# Patient Record
Sex: Female | Born: 1945
Health system: Southern US, Community
[De-identification: ages and names within clinical notes are randomized; demographics above are authoritative.]

## PROBLEM LIST (undated history)

## (undated) DIAGNOSIS — F419 Anxiety disorder, unspecified: Secondary | ICD-10-CM

## (undated) DIAGNOSIS — I10 Essential (primary) hypertension: Secondary | ICD-10-CM

## (undated) DIAGNOSIS — D649 Anemia, unspecified: Secondary | ICD-10-CM

## (undated) DIAGNOSIS — Z780 Asymptomatic menopausal state: Secondary | ICD-10-CM

## (undated) DIAGNOSIS — M545 Low back pain, unspecified: Secondary | ICD-10-CM

## (undated) DIAGNOSIS — R011 Cardiac murmur, unspecified: Secondary | ICD-10-CM

## (undated) DIAGNOSIS — M810 Age-related osteoporosis without current pathological fracture: Secondary | ICD-10-CM

## (undated) DIAGNOSIS — H409 Unspecified glaucoma: Secondary | ICD-10-CM

## (undated) DIAGNOSIS — K219 Gastro-esophageal reflux disease without esophagitis: Secondary | ICD-10-CM

## (undated) DIAGNOSIS — E119 Type 2 diabetes mellitus without complications: Secondary | ICD-10-CM

## (undated) DIAGNOSIS — K862 Cyst of pancreas: Secondary | ICD-10-CM

## (undated) DIAGNOSIS — D126 Benign neoplasm of colon, unspecified: Secondary | ICD-10-CM

## (undated) DIAGNOSIS — E538 Deficiency of other specified B group vitamins: Secondary | ICD-10-CM

## (undated) DIAGNOSIS — K589 Irritable bowel syndrome without diarrhea: Secondary | ICD-10-CM

## (undated) DIAGNOSIS — H269 Unspecified cataract: Secondary | ICD-10-CM

## (undated) DIAGNOSIS — C569 Malignant neoplasm of unspecified ovary: Secondary | ICD-10-CM

## (undated) HISTORY — DX: Malignant neoplasm of unspecified ovary: C56.9

## (undated) HISTORY — DX: Essential (primary) hypertension: I10

## (undated) HISTORY — DX: Anxiety disorder, unspecified: F41.9

## (undated) HISTORY — DX: Asymptomatic menopausal state: Z78.0

## (undated) HISTORY — DX: Low back pain, unspecified: M54.50

## (undated) HISTORY — PX: ABDOMINAL HYSTERECTOMY: SHX81

## (undated) HISTORY — DX: Irritable bowel syndrome, unspecified: K58.9

## (undated) HISTORY — DX: Type 2 diabetes mellitus without complications: E11.9

## (undated) HISTORY — DX: Unspecified glaucoma: H40.9

## (undated) HISTORY — PX: POLYPECTOMY: SHX149

## (undated) HISTORY — DX: Gastro-esophageal reflux disease without esophagitis: K21.9

## (undated) HISTORY — DX: Anemia, unspecified: D64.9

## (undated) HISTORY — DX: Deficiency of other specified B group vitamins: E53.8

## (undated) HISTORY — DX: Cardiac murmur, unspecified: R01.1

## (undated) HISTORY — DX: Unspecified cataract: H26.9

## (undated) HISTORY — DX: Age-related osteoporosis without current pathological fracture: M81.0

## (undated) HISTORY — DX: Benign neoplasm of colon, unspecified: D12.6

## (undated) HISTORY — PX: APPENDECTOMY: SHX54

## (undated) HISTORY — DX: Low back pain: M54.5

## (undated) HISTORY — DX: Cyst of pancreas: K86.2

## (undated) HISTORY — PX: ELBOW FRACTURE SURGERY: SHX616

## (undated) HISTORY — PX: CORONARY ARTERY BYPASS GRAFT: SHX141

---

## 1999-09-06 HISTORY — PX: HEMORRHOID SURGERY: SHX153

## 1999-09-21 ENCOUNTER — Encounter (INDEPENDENT_AMBULATORY_CARE_PROVIDER_SITE_OTHER): Payer: Self-pay | Admitting: Specialist

## 1999-09-21 ENCOUNTER — Other Ambulatory Visit: Admission: RE | Admit: 1999-09-21 | Discharge: 1999-09-21 | Payer: Self-pay | Admitting: Gastroenterology

## 1999-11-04 ENCOUNTER — Encounter (INDEPENDENT_AMBULATORY_CARE_PROVIDER_SITE_OTHER): Payer: Self-pay | Admitting: Specialist

## 1999-11-04 ENCOUNTER — Ambulatory Visit (HOSPITAL_COMMUNITY): Admission: RE | Admit: 1999-11-04 | Discharge: 1999-11-04 | Payer: Self-pay | Admitting: General Surgery

## 2000-06-15 ENCOUNTER — Ambulatory Visit (HOSPITAL_COMMUNITY): Admission: RE | Admit: 2000-06-15 | Discharge: 2000-06-15 | Payer: Self-pay | Admitting: Gastroenterology

## 2000-06-15 ENCOUNTER — Encounter: Payer: Self-pay | Admitting: Gastroenterology

## 2000-07-31 ENCOUNTER — Encounter: Payer: Self-pay | Admitting: Gynecology

## 2000-07-31 ENCOUNTER — Encounter: Admission: RE | Admit: 2000-07-31 | Discharge: 2000-07-31 | Payer: Self-pay | Admitting: Gynecology

## 2000-08-07 ENCOUNTER — Other Ambulatory Visit: Admission: RE | Admit: 2000-08-07 | Discharge: 2000-08-07 | Payer: Self-pay | Admitting: Gynecology

## 2000-08-14 ENCOUNTER — Encounter: Admission: RE | Admit: 2000-08-14 | Discharge: 2000-08-14 | Payer: Self-pay | Admitting: Gynecology

## 2000-08-14 ENCOUNTER — Encounter: Payer: Self-pay | Admitting: Gynecology

## 2001-08-06 ENCOUNTER — Encounter: Admission: RE | Admit: 2001-08-06 | Discharge: 2001-08-06 | Payer: Self-pay | Admitting: Gynecology

## 2001-08-06 ENCOUNTER — Encounter: Payer: Self-pay | Admitting: Gynecology

## 2001-08-08 ENCOUNTER — Encounter: Admission: RE | Admit: 2001-08-08 | Discharge: 2001-08-08 | Payer: Self-pay | Admitting: Gynecology

## 2001-08-08 ENCOUNTER — Encounter: Payer: Self-pay | Admitting: Gynecology

## 2001-08-20 ENCOUNTER — Other Ambulatory Visit: Admission: RE | Admit: 2001-08-20 | Discharge: 2001-08-20 | Payer: Self-pay | Admitting: Gynecology

## 2002-05-30 ENCOUNTER — Encounter: Payer: Self-pay | Admitting: Internal Medicine

## 2002-05-30 ENCOUNTER — Ambulatory Visit (HOSPITAL_COMMUNITY): Admission: RE | Admit: 2002-05-30 | Discharge: 2002-05-30 | Payer: Self-pay | Admitting: Internal Medicine

## 2002-08-13 ENCOUNTER — Encounter: Payer: Self-pay | Admitting: Gynecology

## 2002-08-13 ENCOUNTER — Encounter: Admission: RE | Admit: 2002-08-13 | Discharge: 2002-08-13 | Payer: Self-pay | Admitting: Gynecology

## 2002-08-27 ENCOUNTER — Other Ambulatory Visit: Admission: RE | Admit: 2002-08-27 | Discharge: 2002-08-27 | Payer: Self-pay | Admitting: Gynecology

## 2003-08-15 ENCOUNTER — Encounter: Admission: RE | Admit: 2003-08-15 | Discharge: 2003-08-15 | Payer: Self-pay | Admitting: Gynecology

## 2003-09-02 ENCOUNTER — Other Ambulatory Visit: Admission: RE | Admit: 2003-09-02 | Discharge: 2003-09-02 | Payer: Self-pay | Admitting: Gynecology

## 2004-06-15 ENCOUNTER — Ambulatory Visit (HOSPITAL_COMMUNITY): Admission: RE | Admit: 2004-06-15 | Discharge: 2004-06-15 | Payer: Self-pay | Admitting: Gastroenterology

## 2004-07-08 ENCOUNTER — Ambulatory Visit: Payer: Self-pay | Admitting: Gastroenterology

## 2004-08-09 ENCOUNTER — Ambulatory Visit: Payer: Self-pay | Admitting: Internal Medicine

## 2004-08-20 ENCOUNTER — Ambulatory Visit: Payer: Self-pay | Admitting: Internal Medicine

## 2004-08-20 ENCOUNTER — Ambulatory Visit (HOSPITAL_COMMUNITY): Admission: RE | Admit: 2004-08-20 | Discharge: 2004-08-20 | Payer: Self-pay | Admitting: Internal Medicine

## 2004-08-23 ENCOUNTER — Ambulatory Visit: Payer: Self-pay | Admitting: *Deleted

## 2004-08-23 ENCOUNTER — Encounter: Payer: Self-pay | Admitting: Cardiology

## 2004-08-23 ENCOUNTER — Ambulatory Visit: Payer: Self-pay | Admitting: Internal Medicine

## 2004-08-23 ENCOUNTER — Observation Stay (HOSPITAL_COMMUNITY): Admission: AD | Admit: 2004-08-23 | Discharge: 2004-08-24 | Payer: Self-pay | Admitting: Internal Medicine

## 2004-09-08 ENCOUNTER — Encounter: Admission: RE | Admit: 2004-09-08 | Discharge: 2004-09-08 | Payer: Self-pay | Admitting: Gynecology

## 2004-09-14 ENCOUNTER — Other Ambulatory Visit: Admission: RE | Admit: 2004-09-14 | Discharge: 2004-09-14 | Payer: Self-pay | Admitting: Gynecology

## 2005-01-11 ENCOUNTER — Ambulatory Visit: Payer: Self-pay | Admitting: Internal Medicine

## 2005-03-24 ENCOUNTER — Ambulatory Visit: Payer: Self-pay | Admitting: Internal Medicine

## 2005-06-21 ENCOUNTER — Ambulatory Visit: Payer: Self-pay | Admitting: Internal Medicine

## 2005-08-12 ENCOUNTER — Ambulatory Visit: Payer: Self-pay | Admitting: Internal Medicine

## 2005-09-21 ENCOUNTER — Ambulatory Visit: Payer: Self-pay | Admitting: Internal Medicine

## 2005-10-05 ENCOUNTER — Encounter: Admission: RE | Admit: 2005-10-05 | Discharge: 2005-10-05 | Payer: Self-pay | Admitting: Gynecology

## 2005-10-10 ENCOUNTER — Other Ambulatory Visit: Admission: RE | Admit: 2005-10-10 | Discharge: 2005-10-10 | Payer: Self-pay | Admitting: Gynecology

## 2006-10-31 ENCOUNTER — Encounter: Admission: RE | Admit: 2006-10-31 | Discharge: 2006-10-31 | Payer: Self-pay | Admitting: Gynecology

## 2006-11-16 ENCOUNTER — Other Ambulatory Visit: Admission: RE | Admit: 2006-11-16 | Discharge: 2006-11-16 | Payer: Self-pay | Admitting: Gynecology

## 2007-05-31 ENCOUNTER — Encounter: Payer: Self-pay | Admitting: Internal Medicine

## 2007-05-31 DIAGNOSIS — E538 Deficiency of other specified B group vitamins: Secondary | ICD-10-CM | POA: Insufficient documentation

## 2007-06-30 ENCOUNTER — Ambulatory Visit: Payer: Self-pay | Admitting: Internal Medicine

## 2007-08-10 ENCOUNTER — Ambulatory Visit: Payer: Self-pay | Admitting: Internal Medicine

## 2007-08-10 DIAGNOSIS — I10 Essential (primary) hypertension: Secondary | ICD-10-CM

## 2007-08-10 DIAGNOSIS — M545 Low back pain: Secondary | ICD-10-CM

## 2007-08-10 DIAGNOSIS — M25559 Pain in unspecified hip: Secondary | ICD-10-CM | POA: Insufficient documentation

## 2007-08-10 HISTORY — DX: Essential (primary) hypertension: I10

## 2007-08-13 LAB — CONVERTED CEMR LAB
ALT: 27 units/L (ref 0–35)
AST: 28 units/L (ref 0–37)
Albumin: 3.9 g/dL (ref 3.5–5.2)
Alkaline Phosphatase: 85 units/L (ref 39–117)
BUN: 10 mg/dL (ref 6–23)
Bilirubin, Direct: 0.1 mg/dL (ref 0.0–0.3)
CO2: 28 meq/L (ref 19–32)
Calcium: 9.1 mg/dL (ref 8.4–10.5)
Chloride: 105 meq/L (ref 96–112)
Cholesterol: 184 mg/dL (ref 0–200)
Creatinine, Ser: 0.7 mg/dL (ref 0.4–1.2)
GFR calc Af Amer: 109 mL/min
GFR calc non Af Amer: 90 mL/min
Glucose, Bld: 111 mg/dL — ABNORMAL HIGH (ref 70–99)
HDL: 39.4 mg/dL (ref >39.0)
LDL Cholesterol: 124 mg/dL — ABNORMAL HIGH (ref 0–99)
Potassium: 3.6 meq/L (ref 3.5–5.1)
Sodium: 141 meq/L (ref 135–145)
TSH: 1.25 microintl units/mL (ref 0.35–5.50)
Total Bilirubin: 0.9 mg/dL (ref 0.3–1.2)
Total CHOL/HDL Ratio: 4.7
Total Protein: 7.1 g/dL (ref 6.0–8.3)
Triglycerides: 103 mg/dL (ref 0–149)
VLDL: 21 mg/dL (ref 0–40)
Vitamin B-12: 600 pg/mL (ref 211–911)

## 2007-08-14 ENCOUNTER — Encounter: Payer: Self-pay | Admitting: Internal Medicine

## 2007-08-17 LAB — CONVERTED CEMR LAB: Vit D, 1,25-Dihydroxy: 20 — ABNORMAL LOW (ref 30–89)

## 2007-11-06 ENCOUNTER — Ambulatory Visit: Payer: Self-pay | Admitting: Internal Medicine

## 2007-11-07 LAB — CONVERTED CEMR LAB: Vitamin B-12: 1004 pg/mL — ABNORMAL HIGH (ref 211–911)

## 2007-11-08 ENCOUNTER — Telehealth: Payer: Self-pay | Admitting: Internal Medicine

## 2007-11-12 ENCOUNTER — Ambulatory Visit: Payer: Self-pay | Admitting: Internal Medicine

## 2007-11-12 DIAGNOSIS — B079 Viral wart, unspecified: Secondary | ICD-10-CM | POA: Insufficient documentation

## 2007-11-12 DIAGNOSIS — E559 Vitamin D deficiency, unspecified: Secondary | ICD-10-CM

## 2007-11-12 DIAGNOSIS — K219 Gastro-esophageal reflux disease without esophagitis: Secondary | ICD-10-CM

## 2007-11-15 ENCOUNTER — Encounter: Admission: RE | Admit: 2007-11-15 | Discharge: 2007-11-15 | Payer: Self-pay | Admitting: Gynecology

## 2007-11-19 ENCOUNTER — Encounter: Payer: Self-pay | Admitting: Internal Medicine

## 2008-05-13 ENCOUNTER — Ambulatory Visit: Payer: Self-pay | Admitting: Internal Medicine

## 2008-05-14 LAB — CONVERTED CEMR LAB
BUN: 14 mg/dL (ref 6–23)
CO2: 29 meq/L (ref 19–32)
Calcium: 9.7 mg/dL (ref 8.4–10.5)
Chloride: 109 meq/L (ref 96–112)
Creatinine, Ser: 0.8 mg/dL (ref 0.4–1.2)
GFR calc Af Amer: 93 mL/min
GFR calc non Af Amer: 77 mL/min
Glucose, Bld: 112 mg/dL — ABNORMAL HIGH (ref 70–99)
Potassium: 4.3 meq/L (ref 3.5–5.1)
Sodium: 143 meq/L (ref 135–145)
Vitamin B-12: 470 pg/mL (ref 211–911)

## 2008-05-19 LAB — CONVERTED CEMR LAB: Vit D, 1,25-Dihydroxy: 26 — ABNORMAL LOW (ref 30–89)

## 2008-08-20 ENCOUNTER — Encounter: Payer: Self-pay | Admitting: Internal Medicine

## 2008-09-05 DIAGNOSIS — C569 Malignant neoplasm of unspecified ovary: Secondary | ICD-10-CM

## 2008-09-05 HISTORY — PX: OTHER SURGICAL HISTORY: SHX169

## 2008-09-05 HISTORY — DX: Malignant neoplasm of unspecified ovary: C56.9

## 2008-09-22 ENCOUNTER — Telehealth: Payer: Self-pay | Admitting: Internal Medicine

## 2008-09-22 ENCOUNTER — Ambulatory Visit: Payer: Self-pay | Admitting: Internal Medicine

## 2008-09-22 DIAGNOSIS — R109 Unspecified abdominal pain: Secondary | ICD-10-CM | POA: Insufficient documentation

## 2008-09-22 LAB — CONVERTED CEMR LAB
ALT: 28 units/L (ref 0–35)
AST: 24 units/L (ref 0–37)
Albumin: 4.1 g/dL (ref 3.5–5.2)
Alkaline Phosphatase: 85 units/L (ref 39–117)
BUN: 9 mg/dL (ref 6–23)
Basophils Absolute: 0 10*3/uL (ref 0.0–0.1)
Basophils Relative: 0.6 % (ref 0.0–3.0)
Bilirubin Urine: NEGATIVE
Bilirubin, Direct: 0.1 mg/dL (ref 0.0–0.3)
CO2: 30 meq/L (ref 19–32)
Calcium: 9.2 mg/dL (ref 8.4–10.5)
Chloride: 105 meq/L (ref 96–112)
Creatinine, Ser: 0.8 mg/dL (ref 0.4–1.2)
Crystals: NEGATIVE
Eosinophils Absolute: 0.1 10*3/uL (ref 0.0–0.7)
Eosinophils Relative: 0.8 % (ref 0.0–5.0)
GFR calc Af Amer: 93 mL/min
GFR calc non Af Amer: 77 mL/min
Glucose, Bld: 109 mg/dL — ABNORMAL HIGH (ref 70–99)
HCT: 35.9 % — ABNORMAL LOW (ref 36.0–46.0)
Hemoglobin: 12.7 g/dL (ref 12.0–15.0)
Ketones, ur: NEGATIVE mg/dL
Leukocytes, UA: NEGATIVE
Lipase: 27 units/L (ref 11.0–59.0)
Lymphocytes Relative: 20.2 % (ref 12.0–46.0)
MCHC: 35.4 g/dL (ref 30.0–36.0)
MCV: 84.3 fL (ref 78.0–100.0)
Monocytes Absolute: 0.5 10*3/uL (ref 0.1–1.0)
Monocytes Relative: 6.8 % (ref 3.0–12.0)
Neutro Abs: 5.1 10*3/uL (ref 1.4–7.7)
Neutrophils Relative %: 71.6 % (ref 43.0–77.0)
Nitrite: NEGATIVE
Platelets: 234 10*3/uL (ref 150–400)
Potassium: 3.7 meq/L (ref 3.5–5.1)
RBC: 4.25 M/uL (ref 3.87–5.11)
RDW: 12.5 % (ref 11.5–14.6)
Sed Rate: 37 mm/hr — ABNORMAL HIGH (ref 0–22)
Sodium: 143 meq/L (ref 135–145)
Specific Gravity, Urine: 1.025 (ref 1.000–1.03)
Total Bilirubin: 0.9 mg/dL (ref 0.3–1.2)
Total Protein, Urine: NEGATIVE mg/dL
Total Protein: 7.8 g/dL (ref 6.0–8.3)
Urine Glucose: NEGATIVE mg/dL
Urobilinogen, UA: 0.2 (ref 0.0–1.0)
Vitamin B-12: 433 pg/mL (ref 211–911)
WBC: 7.1 10*3/uL (ref 4.5–10.5)
pH: 5.5 (ref 5.0–8.0)

## 2008-09-23 ENCOUNTER — Ambulatory Visit: Payer: Self-pay | Admitting: Cardiology

## 2008-09-24 ENCOUNTER — Encounter: Payer: Self-pay | Admitting: Internal Medicine

## 2008-09-24 LAB — CONVERTED CEMR LAB
CA 125: 206.3 units/mL — ABNORMAL HIGH (ref 0.0–30.2)
CEA: 1.4 ng/mL (ref 0.0–5.0)

## 2008-09-25 ENCOUNTER — Ambulatory Visit: Payer: Self-pay | Admitting: Internal Medicine

## 2008-09-25 DIAGNOSIS — D490 Neoplasm of unspecified behavior of digestive system: Secondary | ICD-10-CM

## 2008-09-29 ENCOUNTER — Encounter: Payer: Self-pay | Admitting: Internal Medicine

## 2008-09-30 ENCOUNTER — Ambulatory Visit: Admission: RE | Admit: 2008-09-30 | Discharge: 2008-09-30 | Payer: Self-pay | Admitting: Gynecologic Oncology

## 2008-10-01 ENCOUNTER — Ambulatory Visit: Payer: Self-pay | Admitting: Oncology

## 2008-10-07 ENCOUNTER — Encounter: Payer: Self-pay | Admitting: Gynecologic Oncology

## 2008-10-07 ENCOUNTER — Inpatient Hospital Stay (HOSPITAL_COMMUNITY): Admission: RE | Admit: 2008-10-07 | Discharge: 2008-10-14 | Payer: Self-pay | Admitting: Gynecology

## 2008-10-22 ENCOUNTER — Encounter: Payer: Self-pay | Admitting: Internal Medicine

## 2008-10-22 LAB — CBC WITH DIFFERENTIAL/PLATELET
BASO%: 0.4 % (ref 0.0–2.0)
EOS%: 4.9 % (ref 0.0–7.0)
MCH: 28.9 pg (ref 26.0–34.0)
MCHC: 34.3 g/dL (ref 32.0–36.0)
MCV: 84.3 fL (ref 81.0–101.0)
MONO%: 6.5 % (ref 0.0–13.0)
RBC: 4.19 10*6/uL (ref 3.70–5.32)
RDW: 12.9 % (ref 11.3–14.5)
lymph#: 1.2 10*3/uL (ref 0.9–3.3)

## 2008-10-22 LAB — COMPREHENSIVE METABOLIC PANEL
ALT: 19 U/L (ref 0–35)
AST: 15 U/L (ref 0–37)
Albumin: 4.4 g/dL (ref 3.5–5.2)
Alkaline Phosphatase: 84 U/L (ref 39–117)
BUN: 14 mg/dL (ref 6–23)
Calcium: 9.7 mg/dL (ref 8.4–10.5)
Chloride: 106 mEq/L (ref 96–112)
Potassium: 4.1 mEq/L (ref 3.5–5.3)

## 2008-11-07 LAB — CBC WITH DIFFERENTIAL/PLATELET
Basophils Absolute: 0 10*3/uL (ref 0.0–0.1)
EOS%: 2.7 % (ref 0.0–7.0)
HCT: 35.2 % (ref 34.8–46.6)
HGB: 11.8 g/dL (ref 11.6–15.9)
LYMPH%: 28.3 % (ref 14.0–49.7)
MCH: 27.8 pg (ref 25.1–34.0)
MCV: 83 fL (ref 79.5–101.0)
MONO%: 8.4 % (ref 0.0–14.0)
NEUT%: 60.2 % (ref 38.4–76.8)
Platelets: 203 10*3/uL (ref 145–400)

## 2008-11-07 LAB — URINALYSIS, MICROSCOPIC - CHCC
Nitrite: NEGATIVE
Protein: <30 mg/dL
Specific Gravity, Urine: 1.025 (ref 1.003–1.035)

## 2008-11-17 ENCOUNTER — Encounter: Admission: RE | Admit: 2008-11-17 | Discharge: 2008-11-17 | Payer: Self-pay | Admitting: Oncology

## 2008-11-18 ENCOUNTER — Ambulatory Visit: Payer: Self-pay | Admitting: Oncology

## 2008-11-18 LAB — CBC WITH DIFFERENTIAL/PLATELET
BASO%: 0.5 % (ref 0.0–2.0)
Basophils Absolute: 0 10*3/uL (ref 0.0–0.1)
HCT: 37.1 % (ref 34.8–46.6)
HGB: 12.5 g/dL (ref 11.6–15.9)
MCHC: 33.7 g/dL (ref 31.5–36.0)
MONO#: 0.1 10*3/uL (ref 0.1–0.9)
NEUT%: 61.9 % (ref 38.4–76.8)
RDW: 13.2 % (ref 11.2–14.5)
WBC: 3.7 10*3/uL — ABNORMAL LOW (ref 3.9–10.3)
lymph#: 1 10*3/uL (ref 0.9–3.3)

## 2008-11-24 LAB — CBC WITH DIFFERENTIAL/PLATELET
Basophils Absolute: 0 10*3/uL (ref 0.0–0.1)
Eosinophils Absolute: 0.1 10*3/uL (ref 0.0–0.5)
HGB: 10.9 g/dL — ABNORMAL LOW (ref 11.6–15.9)
MCV: 82.8 fL (ref 79.5–101.0)
MONO#: 0.3 10*3/uL (ref 0.1–0.9)
MONO%: 14.4 % — ABNORMAL HIGH (ref 0.0–14.0)
NEUT#: 0.5 10*3/uL — ABNORMAL LOW (ref 1.5–6.5)
RBC: 3.95 10*6/uL (ref 3.70–5.45)
RDW: 13.2 % (ref 11.2–14.5)
WBC: 2.1 10*3/uL — ABNORMAL LOW (ref 3.9–10.3)
lymph#: 1.2 10*3/uL (ref 0.9–3.3)
nRBC: 0 % (ref 0–0)

## 2008-11-26 LAB — CBC WITH DIFFERENTIAL/PLATELET
Basophils Absolute: 0.1 10*3/uL (ref 0.0–0.1)
Eosinophils Absolute: 0.2 10*3/uL (ref 0.0–0.5)
LYMPH%: 27.3 % (ref 14.0–49.7)
MCV: 81.9 fL (ref 79.5–101.0)
MONO%: 14.1 % — ABNORMAL HIGH (ref 0.0–14.0)
NEUT#: 2.3 10*3/uL (ref 1.5–6.5)
Platelets: 156 10*3/uL (ref 145–400)
RBC: 4.03 10*6/uL (ref 3.70–5.45)
nRBC: 1 % — ABNORMAL HIGH (ref 0–0)

## 2008-11-28 ENCOUNTER — Encounter: Payer: Self-pay | Admitting: Internal Medicine

## 2008-11-28 LAB — COMPREHENSIVE METABOLIC PANEL
ALT: 28 U/L (ref 0–35)
AST: 26 U/L (ref 0–37)
Creatinine, Ser: 0.85 mg/dL (ref 0.40–1.20)
Total Bilirubin: 0.4 mg/dL (ref 0.3–1.2)

## 2008-11-28 LAB — CBC WITH DIFFERENTIAL/PLATELET
BASO%: 0.5 % (ref 0.0–2.0)
EOS%: 2.2 % (ref 0.0–7.0)
HCT: 32.7 % — ABNORMAL LOW (ref 34.8–46.6)
LYMPH%: 24 % (ref 14.0–49.7)
MCH: 29 pg (ref 25.1–34.0)
MCHC: 34.4 g/dL (ref 31.5–36.0)
MCV: 84.2 fL (ref 79.5–101.0)
MONO%: 12.7 % (ref 0.0–14.0)
NEUT%: 60.6 % (ref 38.4–76.8)
Platelets: 163 10*3/uL (ref 145–400)

## 2008-12-03 LAB — CBC WITH DIFFERENTIAL/PLATELET
Basophils Absolute: 0 10*3/uL (ref 0.0–0.1)
EOS%: 0.1 % (ref 0.0–7.0)
Eosinophils Absolute: 0 10*3/uL (ref 0.0–0.5)
HCT: 32.6 % — ABNORMAL LOW (ref 34.8–46.6)
HGB: 11.4 g/dL — ABNORMAL LOW (ref 11.6–15.9)
MCH: 29.2 pg (ref 25.1–34.0)
MCV: 84 fL (ref 79.5–101.0)
NEUT#: 3.9 10*3/uL (ref 1.5–6.5)
NEUT%: 86.8 % — ABNORMAL HIGH (ref 38.4–76.8)
lymph#: 0.6 10*3/uL — ABNORMAL LOW (ref 0.9–3.3)

## 2008-12-09 ENCOUNTER — Encounter: Payer: Self-pay | Admitting: Internal Medicine

## 2008-12-15 LAB — CBC WITH DIFFERENTIAL/PLATELET
BASO%: 0.3 % (ref 0.0–2.0)
EOS%: 1 % (ref 0.0–7.0)
LYMPH%: 14.7 % (ref 14.0–49.7)
MCHC: 34.6 g/dL (ref 31.5–36.0)
MCV: 84.4 fL (ref 79.5–101.0)
MONO#: 0.5 10*3/uL (ref 0.1–0.9)
MONO%: 4.7 % (ref 0.0–14.0)
Platelets: 196 10*3/uL (ref 145–400)
RBC: 3.61 10*6/uL — ABNORMAL LOW (ref 3.70–5.45)
WBC: 10.5 10*3/uL — ABNORMAL HIGH (ref 3.9–10.3)

## 2008-12-15 LAB — COMPREHENSIVE METABOLIC PANEL
ALT: 22 U/L (ref 0–35)
Alkaline Phosphatase: 132 U/L — ABNORMAL HIGH (ref 39–117)
Sodium: 140 mEq/L (ref 135–145)
Total Bilirubin: 0.4 mg/dL (ref 0.3–1.2)
Total Protein: 6.9 g/dL (ref 6.0–8.3)

## 2008-12-24 LAB — CBC WITH DIFFERENTIAL/PLATELET
Basophils Absolute: 0 10*3/uL (ref 0.0–0.1)
EOS%: 0.1 % (ref 0.0–7.0)
HCT: 31.3 % — ABNORMAL LOW (ref 34.8–46.6)
HGB: 10.9 g/dL — ABNORMAL LOW (ref 11.6–15.9)
MCH: 29.6 pg (ref 25.1–34.0)
MONO#: 0 10*3/uL — ABNORMAL LOW (ref 0.1–0.9)
NEUT%: 93.5 % — ABNORMAL HIGH (ref 38.4–76.8)
lymph#: 0.3 10*3/uL — ABNORMAL LOW (ref 0.9–3.3)

## 2009-01-08 ENCOUNTER — Ambulatory Visit: Payer: Self-pay | Admitting: Oncology

## 2009-01-12 ENCOUNTER — Encounter: Payer: Self-pay | Admitting: Internal Medicine

## 2009-01-12 LAB — COMPREHENSIVE METABOLIC PANEL
Albumin: 4.1 g/dL (ref 3.5–5.2)
Alkaline Phosphatase: 108 U/L (ref 39–117)
Glucose, Bld: 107 mg/dL — ABNORMAL HIGH (ref 70–99)
Potassium: 4.2 mEq/L (ref 3.5–5.3)
Sodium: 140 mEq/L (ref 135–145)
Total Protein: 7 g/dL (ref 6.0–8.3)

## 2009-01-12 LAB — CBC WITH DIFFERENTIAL/PLATELET
BASO%: 0.4 % (ref 0.0–2.0)
EOS%: 0.5 % (ref 0.0–7.0)
MCH: 30.6 pg (ref 25.1–34.0)
MCHC: 34.9 g/dL (ref 31.5–36.0)
MONO%: 8 % (ref 0.0–14.0)
NEUT%: 71.9 % (ref 38.4–76.8)
RDW: 19.4 % — ABNORMAL HIGH (ref 11.2–14.5)
lymph#: 1 10*3/uL (ref 0.9–3.3)

## 2009-02-03 ENCOUNTER — Encounter: Payer: Self-pay | Admitting: Internal Medicine

## 2009-02-03 LAB — CBC WITH DIFFERENTIAL/PLATELET
BASO%: 0.6 % (ref 0.0–2.0)
HCT: 28 % — ABNORMAL LOW (ref 34.8–46.6)
LYMPH%: 20.4 % (ref 14.0–49.7)
MCHC: 34.7 g/dL (ref 31.5–36.0)
MONO#: 0.4 10*3/uL (ref 0.1–0.9)
NEUT%: 71.2 % (ref 38.4–76.8)
Platelets: 191 10*3/uL (ref 145–400)
WBC: 5.4 10*3/uL (ref 3.9–10.3)

## 2009-02-03 LAB — CA 125: CA 125: 6.4 U/mL (ref 0.0–30.2)

## 2009-02-03 LAB — COMPREHENSIVE METABOLIC PANEL
CO2: 23 mEq/L (ref 19–32)
Creatinine, Ser: 0.76 mg/dL (ref 0.40–1.20)
Glucose, Bld: 109 mg/dL — ABNORMAL HIGH (ref 70–99)
Total Bilirubin: 0.5 mg/dL (ref 0.3–1.2)

## 2009-02-16 LAB — CBC WITH DIFFERENTIAL/PLATELET
BASO%: 0.2 % (ref 0.0–2.0)
LYMPH%: 15.3 % (ref 14.0–49.7)
MCHC: 33.6 g/dL (ref 31.5–36.0)
MONO#: 0.6 10*3/uL (ref 0.1–0.9)
MONO%: 5.9 % (ref 0.0–14.0)
Platelets: 166 10*3/uL (ref 145–400)
RBC: 3.01 10*6/uL — ABNORMAL LOW (ref 3.70–5.45)
WBC: 9.7 10*3/uL (ref 3.9–10.3)

## 2009-02-26 ENCOUNTER — Ambulatory Visit: Payer: Self-pay | Admitting: Oncology

## 2009-03-02 LAB — CBC WITH DIFFERENTIAL/PLATELET
BASO%: 0.6 % (ref 0.0–2.0)
HCT: 26.9 % — ABNORMAL LOW (ref 34.8–46.6)
MCHC: 35.5 g/dL (ref 31.5–36.0)
MONO#: 0.3 10*3/uL (ref 0.1–0.9)
RBC: 2.87 10*6/uL — ABNORMAL LOW (ref 3.70–5.45)
WBC: 3.8 10*3/uL — ABNORMAL LOW (ref 3.9–10.3)
lymph#: 0.9 10*3/uL (ref 0.9–3.3)

## 2009-03-02 LAB — CA 125: CA 125: 7.1 U/mL (ref 0.0–30.2)

## 2009-03-02 LAB — COMPREHENSIVE METABOLIC PANEL
ALT: 21 U/L (ref 0–35)
CO2: 25 mEq/L (ref 19–32)
Calcium: 8.8 mg/dL (ref 8.4–10.5)
Chloride: 104 mEq/L (ref 96–112)
Potassium: 3.5 mEq/L (ref 3.5–5.3)
Sodium: 140 mEq/L (ref 135–145)
Total Protein: 6.6 g/dL (ref 6.0–8.3)

## 2009-03-17 LAB — CBC WITH DIFFERENTIAL/PLATELET
BASO%: 0.2 % (ref 0.0–2.0)
EOS%: 0.4 % (ref 0.0–7.0)
HCT: 29.7 % — ABNORMAL LOW (ref 34.8–46.6)
LYMPH%: 18 % (ref 14.0–49.7)
MCH: 33.3 pg (ref 25.1–34.0)
MCHC: 34.7 g/dL (ref 31.5–36.0)
NEUT%: 74.3 % (ref 38.4–76.8)
Platelets: 183 10*3/uL (ref 145–400)
lymph#: 1.2 10*3/uL (ref 0.9–3.3)

## 2009-03-18 LAB — BASIC METABOLIC PANEL
BUN: 17 mg/dL (ref 6–23)
CO2: 24 mEq/L (ref 19–32)
Chloride: 105 mEq/L (ref 96–112)
Glucose, Bld: 119 mg/dL — ABNORMAL HIGH (ref 70–99)
Potassium: 4.3 mEq/L (ref 3.5–5.3)

## 2009-04-07 ENCOUNTER — Ambulatory Visit (HOSPITAL_COMMUNITY): Admission: RE | Admit: 2009-04-07 | Discharge: 2009-04-07 | Payer: Self-pay | Admitting: Oncology

## 2009-04-10 ENCOUNTER — Ambulatory Visit: Admission: RE | Admit: 2009-04-10 | Discharge: 2009-04-10 | Payer: Self-pay | Admitting: Gynecology

## 2009-04-14 ENCOUNTER — Ambulatory Visit (HOSPITAL_COMMUNITY): Admission: RE | Admit: 2009-04-14 | Discharge: 2009-04-14 | Payer: Self-pay | Admitting: Oncology

## 2009-04-16 ENCOUNTER — Ambulatory Visit: Payer: Self-pay | Admitting: Oncology

## 2009-07-20 ENCOUNTER — Ambulatory Visit: Payer: Self-pay | Admitting: Oncology

## 2009-07-22 ENCOUNTER — Encounter: Payer: Self-pay | Admitting: Internal Medicine

## 2009-07-22 LAB — COMPREHENSIVE METABOLIC PANEL
ALT: 19 U/L (ref 0–35)
CO2: 24 mEq/L (ref 19–32)
Potassium: 3.7 mEq/L (ref 3.5–5.3)
Sodium: 143 mEq/L (ref 135–145)
Total Bilirubin: 0.6 mg/dL (ref 0.3–1.2)
Total Protein: 7.1 g/dL (ref 6.0–8.3)

## 2009-07-22 LAB — CBC WITH DIFFERENTIAL/PLATELET
BASO%: 0.5 % (ref 0.0–2.0)
LYMPH%: 26.1 % (ref 14.0–49.7)
MCHC: 34.4 g/dL (ref 31.5–36.0)
MONO#: 0.3 10*3/uL (ref 0.1–0.9)
RBC: 3.59 10*6/uL — ABNORMAL LOW (ref 3.70–5.45)
RDW: 13.4 % (ref 11.2–14.5)
WBC: 4.3 10*3/uL (ref 3.9–10.3)
lymph#: 1.1 10*3/uL (ref 0.9–3.3)

## 2009-07-22 LAB — CA 125: CA 125: 6.5 U/mL (ref 0.0–30.2)

## 2009-08-27 ENCOUNTER — Ambulatory Visit: Payer: Self-pay | Admitting: Endocrinology

## 2009-08-27 DIAGNOSIS — J209 Acute bronchitis, unspecified: Secondary | ICD-10-CM | POA: Insufficient documentation

## 2009-10-29 ENCOUNTER — Ambulatory Visit: Payer: Self-pay | Admitting: Oncology

## 2009-11-02 ENCOUNTER — Encounter: Payer: Self-pay | Admitting: Internal Medicine

## 2009-11-02 LAB — CBC WITH DIFFERENTIAL/PLATELET
Basophils Absolute: 0 10*3/uL (ref 0.0–0.1)
HCT: 31.5 % — ABNORMAL LOW (ref 34.8–46.6)
HGB: 11.1 g/dL — ABNORMAL LOW (ref 11.6–15.9)
MONO#: 0.4 10*3/uL (ref 0.1–0.9)
NEUT#: 3.5 10*3/uL (ref 1.5–6.5)
NEUT%: 63.9 % (ref 38.4–76.8)
WBC: 5.4 10*3/uL (ref 3.9–10.3)
lymph#: 1.4 10*3/uL (ref 0.9–3.3)

## 2009-11-02 LAB — CA 125: CA 125: 10.3 U/mL (ref 0.0–30.2)

## 2009-11-02 LAB — COMPREHENSIVE METABOLIC PANEL
ALT: 17 U/L (ref 0–35)
Albumin: 4.4 g/dL (ref 3.5–5.2)
BUN: 17 mg/dL (ref 6–23)
CO2: 25 mEq/L (ref 19–32)
Calcium: 9.1 mg/dL (ref 8.4–10.5)
Chloride: 106 mEq/L (ref 96–112)
Creatinine, Ser: 0.92 mg/dL (ref 0.40–1.20)
Potassium: 3.7 mEq/L (ref 3.5–5.3)

## 2009-11-18 ENCOUNTER — Encounter: Admission: RE | Admit: 2009-11-18 | Discharge: 2009-11-18 | Payer: Self-pay | Admitting: Oncology

## 2009-11-18 ENCOUNTER — Ambulatory Visit: Admission: RE | Admit: 2009-11-18 | Discharge: 2009-11-18 | Payer: Self-pay | Admitting: Gynecology

## 2009-11-23 ENCOUNTER — Encounter: Payer: Self-pay | Admitting: Internal Medicine

## 2009-12-03 ENCOUNTER — Encounter: Payer: Self-pay | Admitting: Internal Medicine

## 2009-12-10 ENCOUNTER — Telehealth: Payer: Self-pay | Admitting: Endocrinology

## 2009-12-11 ENCOUNTER — Ambulatory Visit: Payer: Self-pay | Admitting: Endocrinology

## 2009-12-11 ENCOUNTER — Telehealth: Payer: Self-pay | Admitting: Internal Medicine

## 2009-12-14 ENCOUNTER — Encounter: Payer: Self-pay | Admitting: Endocrinology

## 2009-12-14 ENCOUNTER — Telehealth: Payer: Self-pay | Admitting: Internal Medicine

## 2010-01-14 ENCOUNTER — Ambulatory Visit: Payer: Self-pay | Admitting: Oncology

## 2010-01-20 ENCOUNTER — Encounter: Payer: Self-pay | Admitting: Internal Medicine

## 2010-01-20 LAB — CBC WITH DIFFERENTIAL/PLATELET
BASO%: 0.8 % (ref 0.0–2.0)
Basophils Absolute: 0 10*3/uL (ref 0.0–0.1)
Eosinophils Absolute: 0.1 10*3/uL (ref 0.0–0.5)
HCT: 32.2 % — ABNORMAL LOW (ref 34.8–46.6)
LYMPH%: 25.7 % (ref 14.0–49.7)
MCHC: 35.3 g/dL (ref 31.5–36.0)
MONO#: 0.3 10*3/uL (ref 0.1–0.9)
NEUT%: 65 % (ref 38.4–76.8)
Platelets: 202 10*3/uL (ref 145–400)
WBC: 4.4 10*3/uL (ref 3.9–10.3)

## 2010-01-20 LAB — COMPREHENSIVE METABOLIC PANEL
BUN: 18 mg/dL (ref 6–23)
CO2: 22 mEq/L (ref 19–32)
Creatinine, Ser: 0.93 mg/dL (ref 0.40–1.20)
Glucose, Bld: 133 mg/dL — ABNORMAL HIGH (ref 70–99)
Total Bilirubin: 0.5 mg/dL (ref 0.3–1.2)

## 2010-01-20 LAB — CA 125: CA 125: 8.7 U/mL (ref 0.0–30.2)

## 2010-01-27 ENCOUNTER — Ambulatory Visit: Payer: Self-pay | Admitting: Internal Medicine

## 2010-01-27 DIAGNOSIS — Z8543 Personal history of malignant neoplasm of ovary: Secondary | ICD-10-CM | POA: Insufficient documentation

## 2010-04-26 ENCOUNTER — Ambulatory Visit: Payer: Self-pay | Admitting: Oncology

## 2010-05-28 ENCOUNTER — Ambulatory Visit: Admission: RE | Admit: 2010-05-28 | Discharge: 2010-05-28 | Payer: Self-pay | Admitting: Gynecology

## 2010-08-16 ENCOUNTER — Ambulatory Visit: Payer: Self-pay | Admitting: Oncology

## 2010-08-18 ENCOUNTER — Encounter: Payer: Self-pay | Admitting: Internal Medicine

## 2010-08-18 LAB — CBC WITH DIFFERENTIAL/PLATELET
BASO%: 0.5 % (ref 0.0–2.0)
Basophils Absolute: 0 10*3/uL (ref 0.0–0.1)
EOS%: 1.6 % (ref 0.0–7.0)
HGB: 11.4 g/dL — ABNORMAL LOW (ref 11.6–15.9)
MCH: 30 pg (ref 25.1–34.0)
MONO%: 7.5 % (ref 0.0–14.0)
RBC: 3.8 10*6/uL (ref 3.70–5.45)
RDW: 13.9 % (ref 11.2–14.5)
lymph#: 1.2 10*3/uL (ref 0.9–3.3)

## 2010-08-18 LAB — CA 125: CA 125: 8 U/mL (ref 0.0–30.2)

## 2010-08-18 LAB — COMPREHENSIVE METABOLIC PANEL
Albumin: 4.3 g/dL (ref 3.5–5.2)
CO2: 25 mEq/L (ref 19–32)
Glucose, Bld: 96 mg/dL (ref 70–99)
Sodium: 143 mEq/L (ref 135–145)
Total Bilirubin: 0.5 mg/dL (ref 0.3–1.2)
Total Protein: 6.8 g/dL (ref 6.0–8.3)

## 2010-08-26 ENCOUNTER — Telehealth: Payer: Self-pay | Admitting: Internal Medicine

## 2010-09-25 ENCOUNTER — Other Ambulatory Visit: Payer: Self-pay | Admitting: Oncology

## 2010-09-25 DIAGNOSIS — Z1239 Encounter for other screening for malignant neoplasm of breast: Secondary | ICD-10-CM

## 2010-10-05 NOTE — Progress Notes (Signed)
Summary: Aciphex pa  Phone Note From Pharmacy   Summary of Call: PA request--Aciphex. Not available via [email or web address], they will fax forms. Initial call taken by: Lucious Groves,  December 14, 2009 9:42 AM  Follow-up for Phone Call        forms completed and pending approval.  Follow-up by: Lucious Groves,  December 14, 2009 11:23 AM     Appended Document: Aciphex pa approved until 2013.

## 2010-10-05 NOTE — Medication Information (Signed)
Summary: Approved/medco  Approved/medco   Imported By: Lester [place] 12/18/2009 12:02:26  _____________________________________________________________________  External Attachment:    Type:   Image     Comment:   External Document

## 2010-10-05 NOTE — Assessment & Plan Note (Signed)
Summary: PER CHRISTA W/PT @ [facility]ICAL-[facility]   Vital Signs:  Patient profile:   65 year old female Height:      64 inches Weight:      207 pounds BMI:     35.66 O2 Sat:      96 % on Room air Temp:     98.1 degrees F oral Pulse rate:   93 / minute BP sitting:   138 / 78  (left arm) Cuff size:   large  Vitals Entered By: Bill Salinas CMA (Jan 27, 2010 2:04 PM)  O2 Flow:  Room air CC: pt here for cpx/ab  Vision Screening:      Vision Comments: pt had normal eye exam Feb 2011   CC:  pt here for cpx/ab.  History of Present Illness: The patient presents for a wellness examination  C/o R hand hurts  Current Medications (verified): 1)  Aciphex 20 Mg Tbec (Rabeprazole Sodium) .... Take 1 Tab Every Morning 2)  Travatan 0.004 % Soln (Travoprost) .... One Drop in Each Eye Qhs 3)  Cefuroxime Axetil 250 Mg Tabs (Cefuroxime Axetil) .[name].. 1 Bid 4)  Benzonatate 100 Mg Caps (Benzonatate) .[name].. 1 Three Times A Day As Needed Cough 5)  Gabapentin 100 Mg Caps (Gabapentin) .... 2 Qhs 6)  Clonidine Hcl 0.1 Mg Tabs (Clonidine Hcl) .[name].. 1 By Mouth At Bedtime 7)  Boniva 150 Mg Tabs (Ibandronate Sodium) .[name].. 1 Tab Q Month 8)  Vitamin D3 2000 Unit Caps (Cholecalciferol) .[name].. 1 Tab By Mouth Two Times A Day 9)  Upcal D 500-500 Mg-Unt/5gm Powd (Calcium Citrate-Vitamin D) .[name].. 1 Tab Two Times A Day  Allergies (verified): No Known Drug Allergies  Past History:  Family History: Last updated: 08/10/2007 M Alzheimer  Social History: Last updated: 08/10/2007 Occupation: home maker Married Never Smoked  Past Medical History: Hypertension Low back pain Menopause Vit B12 def. IBS Vit D def GERD GYN Dr Nicholas Lose SURG Dr Earlene Plater Ovarian Cancer 09/2008 Dr Janae Sauce  Past Surgical History: Ovarian Ca debulking 09/2008 Colonoscopy 2005 Dr Doreatha Martin  Review of Systems  The patient denies anorexia, fever, weight loss, weight gain, vision loss, decreased hearing, hoarseness, chest pain, syncope,  dyspnea on exertion, peripheral edema, prolonged cough, headaches, hemoptysis, abdominal pain, melena, hematochezia, severe indigestion/heartburn, hematuria, incontinence, genital sores, muscle weakness, suspicious skin lesions, transient blindness, difficulty walking, depression, unusual weight change, abnormal bleeding, enlarged lymph nodes, angioedema, and breast masses.    Physical Exam  General:  normal appearance.   Head:  Normocephalic and atraumatic without obvious abnormalities. No apparent alopecia or balding. Eyes:  No corneal or conjunctival inflammation noted. EOMI. Perrla.  Ears:  External ear exam shows no significant lesions or deformities.  Otoscopic examination reveals clear canals, tympanic membranes are intact bilaterally without bulging, retraction, inflammation or discharge. Hearing is grossly normal bilaterally. Nose:  External nasal examination shows no deformity or inflammation. Nasal mucosa are pink and moist without lesions or exudates. Mouth:  Oral mucosa and oropharynx without lesions or exudates.  Teeth in good repair. Neck:  Supple without thyroid enlargement or tenderness. No cervical lymphadenopathy Chest Wall:  No deformities, masses, or tenderness noted. Breasts:  NE Lungs:  Clear to auscultation bilaterally. Normal respiratory effort.  Heart:  Normal rate and regular rhythm. S1 and S2 normal without gallop, murmur, click, rub or other extra sounds. Abdomen:  Bowel sounds positive,abdomen soft and non-tender without masses, organomegaly or hernias noted. Sensitive to palp diffusely Rectal:  NE Genitalia:  NE Msk:  No deformity  or scoliosis noted of thoracic or lumbar spine.   Pulses:  R and L carotid,radial,femoral,dorsalis pedis and posterior tibial pulses are full and equal bilaterally Extremities:  No clubbing, cyanosis, edema, or deformity noted with normal full range of motion of all joints.   Neurologic:  No cranial nerve deficits noted. Station and gait  are normal. Plantar reflexes are down-going bilaterally. DTRs are symmetrical throughout. Sensory, motor and coordinative functions appear intact. Skin:  Intact without suspicious lesions or rashes Cervical Nodes:  No lymphadenopathy noted Inguinal Nodes:  No significant adenopathy Psych:  Oriented X3 and slightly anxious.     Impression & Recommendations:  Problem # 1:  PHYSICAL EXAMINATION (ICD-V70.0) Assessment New She had a mammo and a nl labs incl CA125, lipids w/dR lOMAX Orders: EKG w/ Interpretation (93000) T-2 View CXR, Same Day (71020.5TC) Health and age related issues were discussed. Available screening tests and vaccinations were discussed as well. Healthy life style including good diet and execise was discussed.  The labs were reviewed with the patient.  Problem # 2:  NEOPLASM UNSPECIFIED NATURE DIGESTIVE SYSTEM (ICD-239.0) Assessment: Comment Only F/u w/onc is pending   Problem # 3:  NEOPLASM, MALIGNANT, OVARY, HX OF (ICD-V10.43) Assessment: Comment Only As per #2  Problem # 4:  HYPERTENSION (ICD-401.9) Assessment: Comment Only  Her updated medication list for this problem includes:    Clonidine Hcl 0.1 Mg Tabs (Clonidine hcl) .[name]... 1 by mouth qhs  Complete Medication List: 1)  Aciphex 20 Mg Tbec (Rabeprazole sodium) .... Take 1 tab every morning 2)  Travatan 0.004 % Soln (Travoprost) .... One drop in each eye qhs 3)  Boniva 150 Mg Tabs (Ibandronate sodium) .[name].. 1 tab q month 4)  Vitamin D3 2000 Unit Caps (Cholecalciferol) .[name].. 1 tab by mouth two times a day 5)  Upcal D 500-500 Mg-unt/5gm Powd (Calcium citrate-vitamin d) .[name].. 1 tab two times a day 6)  Clonidine Hcl 0.1 Mg Tabs (Clonidine hcl) .[name].. 1 by mouth qhs 7)  Aspirin 81 Mg Tbec (Aspirin) .[name].. 1 by mouth qd  Patient Instructions: 1)  Please schedule a follow-up appointment in 1 year well w/labs. 2)  Try to eat more raw plant food, fresh and dry fruit, raw almonds, leafy vegetables, whole foods and less  red meat, less animal fat. Poultry and fish is better for you than pork and beef. Avoid processed foods (canned soups, hot dogs, sausage, bacon , frozen dinners). Avoid corn syrup, high fructose syrup or aspartam and Splenda  containing drinks. Honey, Agave and Stevia are better sweeteners. Make your own  dressing with olive oil, wine vinegar, lemon juce, garlic etc. for your salads. 3)  www.greensmoothiegirl.com 4)  www.rawfamily.com Prescriptions: ACIPHEX 20 MG TBEC (RABEPRAZOLE SODIUM) Take 1 tab every morning  #90 x 3   Entered and Authorized by:   Tresa Garter MD   Signed by:   Tresa Garter MD on 01/27/2010   Method used:   Print then Give to Patient   RxID:   [id number]    Immunization History:  Influenza Immunization History:    Influenza:  historical (06/16/2009)  Zostavax (to be given today)

## 2010-10-05 NOTE — Letter (Signed)
[facility]   Imported By: Sherian Rein 03/10/2010 09:49:52  _____________________________________________________________________  External Attachment:    Type:   Image     Comment:   External Document

## 2010-10-05 NOTE — Letter (Signed)
Summary: Gretta Cool MD  Gretta Cool MD   Imported By: Lester Duncan 12/02/2009 11:06:29  _____________________________________________________________________  External Attachment:    Type:   Image     Comment:   External Document

## 2010-10-05 NOTE — Progress Notes (Signed)
Summary: Rx change  Phone Note From Pharmacy   Caller: [employer]* Summary of Call: [employer] called to inform MD that pt's Aciphex is no longer covered under pt's insurance plan. [employer] is requesting that medicaion be changed to either, Pantoprazole, Omeprazole (both $0 co-pay for pt), or Nexium ($125 copay).  [employer] is requesting to be notified via phone call of any change [phone] ref # [id number]. Please advise. Initial call taken by: Margaret Pyle, CMA,  December 10, 2009 3:12 PM  Follow-up for Phone Call        please call patient: i am happy to change to omeprazole.  is it ok with her? Follow-up by: Minus Breeding MD,  December 10, 2009 5:48 PM  Additional Follow-up for Phone Call Additional follow up Details #1::        per pt she will contact [employer] and request coverage review on Aciphex. Closing phone not until contact from pt or [employer] Additional Follow-up by: Margaret Pyle, CMA,  December 11, 2009 8:33 AM

## 2010-10-05 NOTE — Progress Notes (Signed)
Summary: Aciphex  Phone Note From Pharmacy   Caller: [facility] Summary of Call: Patient mail order Aciphex has not come and she needs Aciphex sent to local pharmacy. Initial call taken by: Lucious Groves,  December 11, 2009 11:39 AM    Prescriptions: ACIPHEX 20 MG TBEC (RABEPRAZOLE SODIUM) Take 1 tab every morning  #30 x 0   Entered by:   Lucious Groves   Authorized by:   Tresa Garter MD   Signed by:   Lucious Groves on 12/11/2009   Method used:   Electronically to        [facility] (retail)       [address]       [place], [place]  [address]       Ph: [phone] or [phone]       Fax: [phone]   RxID:   [id number]

## 2010-10-05 NOTE — Medication Information (Signed)
Summary: Prior authorization/medco  Prior authorization/[facility]   Imported By: Lester [place] 12/17/2009 08:23:06  _____________________________________________________________________  External Attachment:    Type:   Image     Comment:   External Document

## 2010-10-07 NOTE — Progress Notes (Signed)
Summary: aciphex rx  Phone Note Call from Patient Call back at Home Phone [phone]   Caller: Spouse Reason for Call: Refill Medication Summary of Call: Pt's husband Jillyn Hidden called for pt requesting written rx for Aciphex 20mg  1 year supply (#90, 3 refills) so that pt can send into pharmacy-pt's spouse wants to pick up rx today Initial call taken by: Brenton Grills CMA Duncan Dull),  August 26, 2010 9:50 AM  Follow-up for Phone Call        ok 12 months  Follow-up by: Tresa Garter MD,  August 26, 2010 1:17 PM  Additional Follow-up for Phone Call Additional follow up Details #1::        rx  printed...awaiting MD's signature Additional Follow-up by: Brenton Grills CMA Duncan Dull),  August 26, 2010 1:51 PM    Additional Follow-up for Phone Call Additional follow up Details #2::    informed pts husband gary that prescription are ready to be picked up.  Follow-up by: Ami Bullins CMA,  August 27, 2010 10:13 AM  Prescriptions: ACIPHEX 20 MG TBEC (RABEPRAZOLE SODIUM) Take 1 tab every morning  #90 x 3   Entered by:   Brenton Grills CMA (AAMA)   Authorized by:   Tresa Garter MD   Signed by:   Brenton Grills CMA (AAMA) on 08/26/2010   Method used:   Print then Give to Patient   RxID:   [id number]

## 2010-10-13 NOTE — Letter (Signed)
Summary: [facility]  [facility]   Imported By: Lennie Odor 10/05/2010 14:39:32  _____________________________________________________________________  External Attachment:    Type:   Image     Comment:   External Document

## 2010-11-04 ENCOUNTER — Encounter (HOSPITAL_BASED_OUTPATIENT_CLINIC_OR_DEPARTMENT_OTHER): Payer: 59 | Admitting: Oncology

## 2010-11-04 ENCOUNTER — Other Ambulatory Visit: Payer: Self-pay | Admitting: Oncology

## 2010-11-04 DIAGNOSIS — C5700 Malignant neoplasm of unspecified fallopian tube: Secondary | ICD-10-CM

## 2010-11-04 LAB — CA 125: CA 125: 7.1 U/mL (ref 0.0–30.2)

## 2010-11-18 LAB — URINALYSIS, ROUTINE W REFLEX MICROSCOPIC
Bilirubin Urine: NEGATIVE
Ketones, ur: NEGATIVE mg/dL
Nitrite: NEGATIVE
Specific Gravity, Urine: 1.023 (ref 1.005–1.030)
Urobilinogen, UA: 0.2 mg/dL (ref 0.0–1.0)

## 2010-11-18 LAB — URINE CULTURE
Colony Count: 30000
Culture  Setup Time: 201109231719

## 2010-11-19 ENCOUNTER — Ambulatory Visit: Payer: Self-pay | Admitting: Gynecology

## 2010-11-19 ENCOUNTER — Ambulatory Visit: Payer: 59 | Attending: Gynecology | Admitting: Gynecology

## 2010-11-19 DIAGNOSIS — C5700 Malignant neoplasm of unspecified fallopian tube: Secondary | ICD-10-CM | POA: Insufficient documentation

## 2010-11-22 ENCOUNTER — Ambulatory Visit
Admission: RE | Admit: 2010-11-22 | Discharge: 2010-11-22 | Disposition: A | Discharge status: Home / Self Care | Payer: 59 | Source: Ambulatory Visit | Attending: Oncology | Admitting: Oncology

## 2010-11-22 DIAGNOSIS — Z1239 Encounter for other screening for malignant neoplasm of breast: Secondary | ICD-10-CM

## 2010-11-22 NOTE — Consult Note (Signed)
NAMEMARC, SIVERTSEN NO.:  [id number]  MEDICAL RECORD NO.:  [id number]          PATIENT TYPE:  OUT  LOCATION:  GYN                          FACILITY:  [facility]  PHYSICIAN:  De Blanch, M.D.DATE OF BIRTH:  December 03, 1945  DATE OF CONSULTATION:  11/19/2010 DATE OF DISCHARGE:                                  CONSULTATION   CHIEF COMPLAINT:  Fallopian tube carcinoma.  INTERVAL HISTORY:  The patient returns today for continuing followup. She saw Dr. Darrold Span approximately 3 months ago.  CA-125 obtained on November 04, 2010, was 7 (stable).  The patient denies any GI or GU symptoms.  Denies any abdominal pain, pressure or any other pelvic symptoms.  The patient does note that she seems to be having increasing "tingling" and pain on the sole of her feet and some tingling in her hands.  She has not been evaluated for diabetes in recent years according to the patient's history.  It is recalled that she did have some mild peripheral neuropathy secondary to the use of Taxol.  However, it is unusual to see that neuropathy progress and therefore I am concerned she may have some other etiology for this neuropathy.  HISTORY OF PRESENT ILLNESS:  The patient underwent debulking of the fallopian tube carcinoma in February 2010.  She has small residual disease on peritoneal surfaces at completion of surgical procedure.  She was treated by Dr. Darrold Span with 6 cycles of carboplatin and Taxol completed in June of 2010.  At that time, she had a CT scan and a PET scan showing no evidence of disease.  The patient has been followed since that time with exams and CA-125 values all which have been stable and normal.  PAST MEDICAL HISTORY:  Medical illnesses; obesity, diverticulitis, shingles, and degenerative joint disease.  PAST SURGICAL HISTORY:  Hemorrhoidectomy, fallopian tube carcinoma debulking in 2010.  SOCIAL HISTORY:  The patient does not drink or smoke and is  married.  CURRENT MEDICATIONS:  Aciphex, vitamin B, vitamin D, Vivelle-Dot.  She discontinued using Boniva several months ago.  DRUG ALLERGIES:  None.  FAMILY HISTORY:  Negative for gynecologic, breast or colon cancer.  REVIEW OF SYSTEMS:  Ten-point comprehensive review of systems negative except as noted above.  PHYSICAL EXAMINATION:  VITAL SIGNS:  Weight 206 pounds, blood pressure 140/72.  Remainder of vital signs are in the chart. GENERAL:  In general, the patient is a healthy white female in no acute distress. HEENT:  Negative. NECK:  Supple without thyromegaly.  There is no supraclavicular or inguinal adenopathy. ABDOMEN:  Soft, nontender.  No masses, organomegaly, ascites or hernias are noted. PELVIC EXAM:  EGBUS, vagina, bladder, and urethra are normal.  Cervix and uterus are surgically absent.  Adnexa without masses.  Rectovaginal exam confirms. EXTREMITIES:  Lower extremities without edema or varicosities.  IMPRESSION:  Stage III fallopian tube carcinoma, clinically free of disease.  PLAN:  The patient will see Dr. Darrold Span in 3 months and have repeat CA- 125 at that time.  In June of 2012, she will complete 2 years of followup since completing chemotherapy.  We will then lengthen the visits to  every 6 months.  I will plan on seeing the patient back again in the gynecologic oncology office in 9 months from this visit.  With regard to the patient's apparent neuropathy, I have encouraged her to talk this over with the primary care physician and certainly would consider evaluating her for diabetes.     De Blanch, M.D.     DC/MEDQ  D:  11/19/2010  T:  11/19/2010  Job:  [id number]  cc:   Gretta Cool, M.D. Fax: [phone]  Lennis P. Darrold Span, M.D. Fax: [phone]  [name]. [name], MD [address] [place] [address]  Telford Nab, R.N. [address] [place], [place] [address]  Electronically Signed by De Blanch  M.D. on 11/22/2010 03:07:16 PM

## 2010-12-03 ENCOUNTER — Other Ambulatory Visit (INDEPENDENT_AMBULATORY_CARE_PROVIDER_SITE_OTHER): Payer: 59

## 2010-12-03 ENCOUNTER — Ambulatory Visit (INDEPENDENT_AMBULATORY_CARE_PROVIDER_SITE_OTHER): Payer: 59 | Admitting: Internal Medicine

## 2010-12-03 ENCOUNTER — Encounter: Payer: Self-pay | Admitting: Internal Medicine

## 2010-12-03 DIAGNOSIS — Z8543 Personal history of malignant neoplasm of ovary: Secondary | ICD-10-CM

## 2010-12-03 DIAGNOSIS — R209 Unspecified disturbances of skin sensation: Secondary | ICD-10-CM

## 2010-12-03 DIAGNOSIS — E538 Deficiency of other specified B group vitamins: Secondary | ICD-10-CM

## 2010-12-03 DIAGNOSIS — R202 Paresthesia of skin: Secondary | ICD-10-CM | POA: Insufficient documentation

## 2010-12-03 DIAGNOSIS — E559 Vitamin D deficiency, unspecified: Secondary | ICD-10-CM

## 2010-12-03 DIAGNOSIS — M545 Low back pain: Secondary | ICD-10-CM

## 2010-12-03 LAB — CBC WITH DIFFERENTIAL/PLATELET
Basophils Relative: 0.4 % (ref 0.0–3.0)
Eosinophils Absolute: 0.1 10*3/uL (ref 0.0–0.7)
Eosinophils Relative: 1.3 % (ref 0.0–5.0)
HCT: 32.6 % — ABNORMAL LOW (ref 36.0–46.0)
Hemoglobin: 11.2 g/dL — ABNORMAL LOW (ref 12.0–15.0)
MCHC: 34.3 g/dL (ref 30.0–36.0)
MCV: 86.8 fl (ref 78.0–100.0)
Monocytes Absolute: 0.3 10*3/uL (ref 0.1–1.0)
Neutro Abs: 3.3 10*3/uL (ref 1.4–7.7)
RBC: 3.76 Mil/uL — ABNORMAL LOW (ref 3.87–5.11)

## 2010-12-03 LAB — COMPREHENSIVE METABOLIC PANEL
AST: 30 U/L (ref 0–37)
Alkaline Phosphatase: 74 U/L (ref 39–117)
BUN: 14 mg/dL (ref 6–23)
Calcium: 9.3 mg/dL (ref 8.4–10.5)
Creatinine, Ser: 0.8 mg/dL (ref 0.4–1.2)
Glucose, Bld: 108 mg/dL — ABNORMAL HIGH (ref 70–99)

## 2010-12-03 MED ORDER — GABAPENTIN 100 MG PO CAPS: 100.0000 mg | ORAL_CAPSULE | Freq: Three times a day (TID) | ORAL | Status: DC | PRN

## 2010-12-03 MED ORDER — CYANOCOBALAMIN 500 MCG/0.1ML NA SOLN: 500.0000 ug | NASAL | Status: DC

## 2010-12-03 NOTE — Assessment & Plan Note (Signed)
She stopped B12 long time ago. Compliance discussed

## 2010-12-03 NOTE — Progress Notes (Signed)
  Subjective:    Patient ID: [name], female    DOB: [birth year], 65 y.o.   MRN: [id number]  HPI  C/o tingling and numbness in B feet and hands since December - worse with standing. Not better with OTC meds. She stopped Vit B12 inj years ago for ? reason  Review of Systems  Constitutional: Negative for appetite change.  HENT: Negative for congestion and tinnitus.   Eyes: Negative for pain and visual disturbance.  Respiratory: Negative for shortness of breath.   Cardiovascular: Negative for chest pain, palpitations and leg swelling.  Musculoskeletal: Negative for joint swelling and gait problem.  Skin: Negative for rash.  Neurological: Positive for dizziness. Negative for syncope.  Psychiatric/Behavioral: Negative for behavioral problems and dysphoric mood.       Objective:   Physical Exam  Constitutional: She is oriented to person, place, and time. She appears well-developed. No distress.       Obese  HENT:  Head: Normocephalic.  Eyes: Conjunctivae are normal. Right eye exhibits no discharge.  Neck: Normal range of motion. No JVD present.  Cardiovascular: Normal rate.   No murmur heard. Pulmonary/Chest: She has no wheezes.  Abdominal: She exhibits no mass.  Musculoskeletal: Normal range of motion. She exhibits no edema and no tenderness.  Neurological: She is oriented to person, place, and time.  Skin: Rash (mild rosacea) noted. No erythema. No pallor.  Psychiatric: She has a normal mood and affect.       Anxious          Assessment & Plan:  VITAMIN B12 DEFICIENCY She stopped B12 long time ago. Compliance discussed  Paresthesia of both legs Check labs - sugar, B12, TSH. Neuropathic symptoms could be related to chemo as well.  NEOPLASM, MALIGNANT, OVARY, HX OF Doing well - CA125 was OK    Paresthesia --B hands  R/o Vit B12 def etc., see labs  Anxiety  Not on meds

## 2010-12-03 NOTE — Assessment & Plan Note (Signed)
Check labs - sugar, B12, TSH. Neuropathic symptoms could be related to chemo as well.

## 2010-12-03 NOTE — Assessment & Plan Note (Signed)
Doing well - CA125 was OK

## 2010-12-06 ENCOUNTER — Telehealth: Payer: Self-pay | Admitting: Internal Medicine

## 2010-12-06 MED ORDER — VITAMIN D3 1.25 MG (50000 UT) PO CAPS: 1.0000 | ORAL_CAPSULE | ORAL | Status: DC

## 2010-12-06 NOTE — Telephone Encounter (Signed)
Pt informed/ copies mailed.  

## 2010-12-06 NOTE — Telephone Encounter (Signed)
[name] , please, inform the patient:  the labs are normal, except for low Vit B12 and low Vit D. Pls, start on B12 nasal and start Vit D po.   Please, keep  next office visit appointment in 3 mo with B12, Vit D levels checked   Thank you !

## 2010-12-12 LAB — GLUCOSE, CAPILLARY: Glucose-Capillary: 102 mg/dL — ABNORMAL HIGH (ref 70–99)

## 2010-12-20 LAB — DIFFERENTIAL
Lymphocytes Relative: 15 % (ref 12–46)
Lymphs Abs: 0.8 10*3/uL (ref 0.7–4.0)
Monocytes Absolute: 0.3 10*3/uL (ref 0.1–1.0)
Monocytes Relative: 7 % (ref 3–12)
Neutro Abs: 4.1 10*3/uL (ref 1.7–7.7)

## 2010-12-20 LAB — CBC
HCT: 35.6 % — ABNORMAL LOW (ref 36.0–46.0)
MCV: 86.5 fL (ref 78.0–100.0)
Platelets: 237 10*3/uL (ref 150–400)
RDW: 13.3 % (ref 11.5–15.5)

## 2010-12-20 LAB — COMPREHENSIVE METABOLIC PANEL
Albumin: 3.9 g/dL (ref 3.5–5.2)
BUN: 9 mg/dL (ref 6–23)
Calcium: 9.3 mg/dL (ref 8.4–10.5)
Creatinine, Ser: 0.73 mg/dL (ref 0.4–1.2)
Potassium: 3.7 mEq/L (ref 3.5–5.1)
Total Protein: 7.3 g/dL (ref 6.0–8.3)

## 2010-12-20 LAB — TYPE AND SCREEN: ABO/RH(D): A POS

## 2010-12-21 LAB — BASIC METABOLIC PANEL
BUN: 6 mg/dL (ref 6–23)
CO2: 27 mEq/L (ref 19–32)
CO2: 30 mEq/L (ref 19–32)
Calcium: 8.7 mg/dL (ref 8.4–10.5)
Chloride: 104 mEq/L (ref 96–112)
Creatinine, Ser: 0.76 mg/dL (ref 0.4–1.2)
Creatinine, Ser: 0.77 mg/dL (ref 0.4–1.2)
GFR calc Af Amer: >60 mL/min (ref >60)
GFR calc Af Amer: >60 mL/min (ref >60)

## 2010-12-21 LAB — CBC
MCHC: 34 g/dL (ref 30.0–36.0)
MCV: 86.3 fL (ref 78.0–100.0)
RBC: 3.9 MIL/uL (ref 3.87–5.11)

## 2011-01-18 NOTE — Consult Note (Signed)
NAMEJAYNE, Shepherd NO.:  [id number]   MEDICAL RECORD NO.:  [id number]          PATIENT TYPE:  OUT   LOCATION:  GYN                          FACILITY:  [facility]   PHYSICIAN:  Brandi A. Duard Brady, MD    DATE OF BIRTH:  01-Oct-1945   DATE OF CONSULTATION:  09/30/2008  DATE OF DISCHARGE:                                 CONSULTATION   REFERRING PHYSICIAN:  Gretta Shepherd, M.D.   The patient is seen today in consultation at the request of Dr. Nicholas Shepherd.  [name] is a 65 year old gravida 2, para 2 who comes in accompanied  by her husband.  She states that her stomach has always been an issue.  However, in October of 2009 she has been having some more burning after  eating.  She thought it was an ulcer and a sore stomach.  She states  that she has undergone an upper endoscopy about 3 years ago.  Also had a  colonoscopy 5 years ago that showed some benign polyps.  She was placed  on some Aciphex and that did help with some of her symptoms.  Her story  is a little circuitous and it is difficult to ascertain a true history.  She states that she underwent an ultrasound that was essentially  unremarkable and it did not show anything in her gallbladder.  She  states that many of her symptoms are because she gets too upset.  Sometime she will have some constipation if she gets too upset regarding  her daughters or having to have a doctor's appointment and she will have  some diarrhea.  She states occasionally when she leans over when she is  doing quilting she has some abdominal pain.  She feels that she is  gaining weight in her stomach though she denies any early satiety.  She  overall feels somewhat fuller.  In October of 2009 maybe some of her  abdominal symptoms got worse.  She also began experiencing some back  pain.  She went through a fairly significant back workup that was all  unremarkable, but she might have some degenerative joint disease at L3-4  she believes.  She  also states that she had a shingles attack in 2009  and since that time has had some numbness in her right anterior thigh to  her mons and her anterior abdominal wall and she is not sure how much  that might be contributing to some of her symptoms.  She states her  energy level is pretty good.  She states that she gets her activities  done.  She denies any chest pain,  shortness of breath, nausea,  vomiting, fevers, chills, headaches, visual changes, unintentional  weight loss or weight gain.  She did have a CT scan performed on September 25, 2008.  It revealed clear lung bases.  There is no pleural or  pericardial effusion.  There was extensive omental caking present  bilaterally.  The liver, gallbladder, spleen, pancreas, kidneys, adrenal  glands all appeared normal.  The stomach and small bowel also appeared  normal.  There are no focal  bony abnormalities.  Within the pelvis there  was nodularity about the appendix.  The contrast material was present  within the appendix.  The uterus was normal.  The adnexa were not  visualized.  Her CA-125 was elevated  to 206.3.  Her CEA was normal at  1.4.  It is because of this that she gets referred to our office.  She  and her husband have multiple questions.  Their questions are regarding  whether or not they should just proceed with an FNA-guided biopsy, get a  tissue diagnosis and proceed with chemotherapy versus if they should  proceed with surgical intervention.   PAST MEDICAL HISTORY:  1. Significant for diverticulitis.  2. Shingles.  3. Heart murmur as a child.  4. Generative joint disease.   PAST SURGICAL HISTORY:  Hemorrhoid surgery in 2001.   SOCIAL HISTORY:  She denies use of tobacco or alcohol.  She does not  work outside the home.   MEDICATIONS:  1. Aciphex 20 mg daily.  2. B12.  3. vitamin D.  4. Vivelle Dot.  5. She is supposed to be taking her norethindrone every month.  She      has not taken that since the fall of  2009.  Has not been doing her      withdrawal bleeds   ALLERGIES:  None though she is concerned that she might have a family  history of Compazine reactions as her daughter had a dystonic reaction  to Compazine.   FAMILY HISTORY:  Her mother had Alzheimer's at 29.  She has a daughter  with recurrent meningitis.  Her father died at 30 from lung cancer.  He  was a smoker.  She has two first cousins who have bowel and abdominal  symptoms.  One has maybe Crohn's disease.  The other one might have  endometriosis.  She thinks that her abdominal symptoms which have been  lifelong are familial.   HEALTH MAINTENANCE:  She had a mammogram last year.  She is due in March  2010.  She had a colonoscopy 5 years ago for benign polyps.  These were  benign.  She had an upper endoscopy 3 years ago which was negative.   PHYSICAL EXAMINATION:  Weight 200 pounds, height 5 feet 4 inches.  Blood  pressure 147/76, pulse 119.  Well-nourished, well-developed female in no  acute distress.  NECK:  Supple.  There is no lymphadenopathy, no thyromegaly.  LUNGS:  Are clear to auscultation bilaterally.  CARDIOVASCULAR EXAM:  Regular rate and rhythm.  There are no murmurs.  ABDOMEN:  Is obese, soft, tender diffusely.  There is no rebound or  guarding.  There is no fluid wave.  There is no distinct mass.  Groins  are negative for adenopathy.  EXTREMITIES:  There is no edema.  PELVIC:  External genitalia is within normal limits though somewhat  atrophic.  Bimanual examination:  The cervix is palpably normal.  The  corpus was normal size, shape and consistency.  It is retroverted.  On  rectovaginal examination there is no nodularity.  I cannot appreciate  any adnexal masses.   ASSESSMENT:  [age]-year-old with omental caking, some free fluid,  elevated CA-125, normal CEA.  I had a lengthy at least 45 to one hour  conversation with the patient and husband regarding her current  situation.  I believe that she may  very well have a primary peritoneal  carcinoma.  While we could proceed with biopsy and neoadjuvant approach,  that is typically reserved in  patients who have poor nutritional status  or have other medical comorbidities that would preclude an optimal  surgical response.  I do not see anything on her CT report that would  indicate that she could not be optimally debulked.  That being said my  preference would be to attempt surgical debulking.  Get a pathologic  diagnosis and then consider postoperative therapy pending the pathology  and her surgical outcome.  At this point she is tentatively scheduled  for surgery for October 07, 2008 with myself and Dr. Nicholas Shepherd.  Risks and  benefits of surgery including bleeding, infection, injury to surrounding  organs, thromboembolic disease, but not limited to these were discussed  with the patient and her husband.  She states at this point they are  very shell shocked.  They were not necessarily aware of all these possibilities and they do  not have any questions for me at this time.  They do have my card as  well as that of Telford Nab and they were encouraged to call me  should they have any questions.  We will coordinate surgery with Dr.  Nicholas Shepherd if at all possible.      Brandi A. Duard Brady, MD  Electronically Signed     PAG/MEDQ  D:  09/30/2008  T:  09/30/2008  Job:  [id number]   cc:   Telford Nab, R.N.  [address]  [place], [place] [address]   [name]. [name], MD  [address]  [place]  [place] [address]   [name], M.D.  Fax: [phone]

## 2011-01-18 NOTE — Op Note (Signed)
[name], [name]             ACCOUNT NO.:  [id number]   MEDICAL RECORD NO.:  [id number]          PATIENT TYPE:  INP   LOCATION:  1538                         FACILITY:  [facility]   PHYSICIAN:  Paola A. Duard Brady, MD    DATE OF BIRTH:  03-29-1946   DATE OF PROCEDURE:  DATE OF DISCHARGE:                               OPERATIVE REPORT   PREOPERATIVE DIAGNOSIS:  Probable primary peritoneal versus ovarian  carcinoma, elevated CA-125.   POSTOPERATIVE DIAGNOSIS:  Stage III C primary peritoneal carcinoma.   PROCEDURE:  Exploratory laparotomy, TAH-BSO, omentectomy, appendectomy,  optimal tumor debulking.   Surgeon: Cleda Mccreedy, MD   ASSISTANT:  Beather Arbour, MD  Telford Nab, RN   ANESTHESIA:  General.   ANESTHESIOLOGIST:  Fortune.   ESTIMATED BLOOD LOSS:  200 mL.   URINE OUTPUT:  400 mL.   IV FLUIDS:  2200 mL.   COMPLICATIONS:  None.   SPECIMENS:  Included omentum, appendix, uterus, cervix, bilateral tubes  and ovaries.   OPERATIVE FINDINGS:  Included significant omental cake measuring  approximately 18 x 5 cm significantly adhered to the anterior abdominal  wall with no invasion to the fascia limited to the peritoneal cavity.  Significant peritoneal study overlying the diaphragm involving the small  bowel, mesentery, the surface of the descending and a descending colon,  the appendix.  There was some thickening along the anterior bladder  flap.  Bilateral atrophic ovaries.  At the completion of her therapy the  largest plaque was one that measured approximately 1 cm involving the  surface of the cecum.  Miliary disease throughout the large and small  bowel mesentery and diaphragmatic studding with no lesion greater than 1  cm.   The patient was taken to the operating room, placed in the supine  position where general anesthesia was induced.  She was then placed in  the dorsal lithotomy position with SCDs and all appropriate precautions.  The abdomen was prepped in  the usual fashion as was the perineum and the  vagina.  Foley catheter was inserted into the bladder under sterile  conditions.  The patient was then draped.  Time-out was performed to  confirm the patient, the procedure, allergies and antibiotic status, as  well as staff.  A vertical midline incision was made with the knife and  carried down to the underlying fascia using Bovie cautery.  The fascial  incision was tented superiorly and inferiorly.  The rectus bellies were  dissected off the overlying fascia.  Peritoneal cavity was tented and  entered sharply.  Findings as above were noted.  The perineal incision  was advanced superiorly and inferiorly with visualization of the  underlying peritoneal cavity.  Upon identification of the adherent  omental, the decision was made to extend the incision just above the  umbilicus and this was done without difficulty.  The fascia was then  grasped with Kocher clamps.  The omentum was dissected off the anterior  abdominal wall using Bovie cautery.  Once the omentum was free of the  falciform in the anterior abdominal wall, we then opened up the  peritoneum on  the patient's left side, mobilized the colon near the  splenic flexure by opening up the white line of Toldt.  This allowed  increase mobilization of the transverse colon.  An infracolic  omentectomy was then performed by creating pedicles on the undersurface  of the colon separating it from the omentum.  Each follicle was created,  clamped and transected after transection with suture ligated with  0Vicryl.  There was some areas where the omentum was fairly adherent to  the transverse colon.  We were able to take this disease off of the  colon using sharp dissection.  There are no colotomies or injuries to  the serosa of the colon.  This was performed until we amputated the  omentum near the splenic flexure on the patient's left side.  There was  a rind measuring approximately 5 cm of diseased  omentum.  This was as a  separate specimen by dissecting it carefully off the underlying bowel.  The pedicle and its blood supply and the mesentery was taken by creating  a pedicle, transecting it and suture ligating it.  This area was noted  to then be hemostatic.   There was an area of tumor in the patient's left colon.  It was unclear  whether or not this was involving the colon or merely involving the  epiploica.  We then continued dissecting along the white line of the  Toldt and reflected the left side of the colon.  With careful dissection  it was apparent that this was disease involving the epiploica and not a  large tumor mass.  That small portion of epiploica was removed without  difficulty.  The patient was then placed in the Trendelenburg position.  The Bookwalter self-retaining retractor was placed with all appropriate  cautions.  The small bowel was packed out of the way using moist  laparotomy sponges.  Our attention was drawn to performing the  hysterectomy.  The round ligament on the patient's right side was  transected using monopolar cautery.  The anterior and posterior leaves  of the broad ligament were opened.  The ureter was identified in the  retroperitoneum on the right side.  A window was made between the IP and  the ureter.  The IP was clamped x2, transected and suture ligated.  We  then continued down the bladder flap.  There was some disease involving  the peritoneum of the bladder flap.  This was taken down easily.  The  uterine artery was then skeletonized on the patient's right side,  transected with curved mattress and clamps and suture ligated.  A  similar procedure was performed on the patient's left side.  We then  continued down the cardinal ligaments with curved mattress and clamps.  Each pedicle was created, transected and suture ligated.  We came across  the cervicovaginal junction with curved mattress and clamp and the  cervix was amputated from  the vagina.  The vagina was closed using four  sutures of figure-of-eight 0Vicryl.  There is small area of bleeding at  the uterine artery on the patient's right side.  This was controlled  with a hemoclip.  The rest of the pelvis was hemostatic.  It was  copiously irrigated.  There was some small miliary residual disease  along the rectosigmoid colon and in the posterior cul-de-sac.   We then removed the laparotomy sponges holding the small bowel back and  our attention was drawn to the appendix.  It was clear that the  appendix  was involved with approximately 3 cm of tumor.  We mobilized the cecum  by opening up the retroperitoneum on the patient's right side similarly  along the white line of Toldt.  The cecum was elevated off of the  infusible pelvic vessels with the ureter noted to be well inferior and  medial to the area of dissection.  Once the cecum was elevated our  attention was drawn to the appendectomy.  The mesoappendix was clamped  with a Burlisher, transected and suture ligated.  We then clamped across  the base of the appendix.  Two separate sutures of 0Vicryl were then  placed in the crush of the appendix.  The appendix was then transected  and handed off dirty.  The appendiceal stump was coagulated using  cautery on the tip of the knife.  It was not oversewn.  There area was  noted to be well hemostatic.  The remainder of the small bowel was run  from its entirety from the ileocecal junction to the ligament of Treitz.  Again. there was diffuse miliary disease.  No area larger than a  centimeter within the mesentery of the small bowel.  There were several  areas were these miliary lesions were causing some kinking of the  mesentery of the small bowel.  This area was freed.  At the conclusion  of the procedure as stated above, there was miliary disease within the  peritoneal surfaces of the colon and the small bowel as well as  involving the mesentery of the small bowel  and colon.  There was a 1 cm  nodule in the cecum on the surface.  There is diffuse miliary disease  along the right hemi diaphragm.  The left hemidiaphragm was free of  disease.  All pedicles were hemostatic.   The laparotomy sponges were removed.  The fascia was closed using a  running #1 PDS in a mass closure.  The subcu tissues were irrigated,  made hemostatic.  The subcu tissues were reapproximated using 3-0  Vicryl.  The skin was closed using skin clips.   The patient was taken to the recovery room and tolerated the procedure  well.  Again, all instrument, needle, Ray-Tec and laparotomy counts were  correct x2.      Paola A. Duard Brady, MD  Electronically Signed     PAG/MEDQ  D:  10/07/2008  T:  10/08/2008  Job:  [id number]   cc:   Telford Nab, R.N.  [address]  [place], [place] [address]   [name]. [name], MD  [address]  [place]  [place] [address]   Gretta Cool, M.D.  Fax: [phone]

## 2011-01-18 NOTE — Discharge Summary (Signed)
[name], GERALD             ACCOUNT NO.:  [id number]   MEDICAL RECORD NO.:  [id number]          PATIENT TYPE:  INP   LOCATION:  1538                         FACILITY:  [facility]   PHYSICIAN:  Paola A. Duard Brady, MD    DATE OF BIRTH:  08-18-1946   DATE OF ADMISSION:  10/07/2008  DATE OF DISCHARGE:  10/14/2008                               DISCHARGE SUMMARY   PRIMARY PROCEDURES AND OPERATIONS:  Surgery on October 07, 2008,  including exploratory laparotomy, TAH, BSO, omentectomy, appendectomy  optimally debulked with only a 1-cm surface lesion on the cecum.  Final  pathology was a stage IIIC fallopian tube carcinoma involving the  omentum, the uterus, tubes, the ovaries, and the appendix.   Patient was admitted postoperatively.  Throughout her course, she  remained afebrile with vital signs stable and adequate urine output.  Her hematocrit was normal postoperatively.  Postoperatively, she had  appropriate prophylaxis with SCDs and Lovenox.  Regular diet was  advanced on October 09, 2008, and she tolerated small amounts.  She was  able to pass gas on October 09, 2008, but did not have a bowel movement.  Primary attending in charge, Dr. Nicholas Lose, gave several enemas on October 12, 2008, and October 13, 2008, with no significant bowel movements;  however, patient continued to tolerate regular gas, had no nausea or  vomiting, remained afebrile, was able to tolerate a regular diet.  Patient was discharged on October 14, 2008, in stable condition with  disposition to return to clinic for discussion of chemotherapeutic  options.      Paola A. Duard Brady, MD  Electronically Signed     PAG/MEDQ  D:  12/04/2008  T:  12/04/2008  Job:  [id number]   cc:   Telford Nab, R.N.  [address]  [place], [place] [address]   [name]. [name], MD  [address]  [place]  [place] [address]

## 2011-01-18 NOTE — H&P (Signed)
[name], [name] NO.:  [id number]   MEDICAL RECORD NO.:  [id number]          PATIENT TYPE:  OUT   LOCATION:  GYN                          [facility]ACILITY:  [facility]   PHYSICIAN:  De Blanch, M.D.DATE OF BIRTH:  Dec 28, 1945   DATE OF ADMISSION:  04/10/2009  DATE OF DISCHARGE:  04/10/2009                              HISTORY & PHYSICAL   CHIEF COMPLAINT:  Ovarian cancer.   INTERVAL HISTORY:  The patient returns today having now completed six  cycles of carboplatin and Taxol for treatment of a stage IIIC fallopian  tube carcinoma.  Her treatment has gone relatively smoothly, although  she required Neulasta after the last several treatments and has had  progressive neuropathy in her feet resulting in a decreased Taxol dose  with cycle 5-6.  Her CA-125 value at the initial therapy was 150 units  per mL and fell promptly.  Most recent value on March 02, 2009, was 7.1  units per mL.  The patient has had a restaging CT scan on April 07, 2009, which showed what appears to be some level of persistent disease  with findings of residual soft tissue stranding along the undersurface  of the ventral abdominal wall, as well as peritoneal nodularity in the  pelvis.  She does not have any adenopathy or any evidence of ascites.   The patient herself feels reasonably well.  She seems to have tolerated  the chemotherapy well.  She denies any GI or GU symptoms.  She has no  pelvic pain, pressure, vaginal bleeding or discharge.   HISTORY OF PRESENT ILLNESS:  The patient underwent exploratory  laparotomy, including total abdominal surgery, bilateral salpingo-  oophorectomy, omentectomy, appendectomy and optimal tumor debulking on  October 07, 2008.  She had small residual disease on peritoneal  surfaces at the completion of the surgical procedure and was treated  with six cycles of carboplatin and Taxol chemotherapy.   PAST MEDICAL HISTORY:  1. Diverticulitis.  2.  Shingles.  3. Degenerative joint disease.   PAST SURGICAL HISTORY:  1. Hemorrhoid surgery in 2001.  2. Ovarian cancer debulking in 2010.   SOCIAL HISTORY:  The patient does not smoke or drink.  She does not work  outside the home.   CURRENT MEDICATIONS:  Aciphex, vitamin B12, vitamin D, Vivelle-Dot.   DRUG ALLERGIES:  NONE.   FAMILY HISTORY:  Negative for gynecologic, breast or colon cancer.   SOCIAL HISTORY:  The patient is married and she does not smoke.   REVIEW OF SYSTEMS:  A 10-point comprehensive review of systems is  negative except as noted above.   PHYSICAL EXAMINATION:  VITAL SIGNS:  Weight 188 pounds, blood pressure  132/70.  GENERAL:  The patient is a healthy white female in no acute distress.  HEENT:  Negative.  NECK:  Supple without thyromegaly.  There is no supraclavicular or  inguinal adenopathy.  ABDOMEN:  Soft and nontender.  No mass, organomegaly, ascites or hernias  noted.  PELVIC:  EG/BUS, vagina, bladder and urethra are normal.  The cervix and  uterus are surgically absent.  Adnexa without masses.  Rectovaginal  exam  confirms.  LOWER EXTREMITIES:  Without edema or varicosities.   IMPRESSION:  Stage IIIC fallopian tube carcinoma.  The patient has had  an excellent response based on her CA-125 fall and her symptoms.  However, there are several areas on the CT scan that are suspicious for  persistent disease.  I had a lengthy discussion with the patient, her  husband and daughter regarding management options.  Certainly, we could  stop the chemotherapy and observe her, although I think there seems to  be a high likelihood that she would recur.  On the other hand, if she  has persistent disease she may wish to continue chemotherapy in hopes of  achieving even a better response than what she has achieved to date.  After discussing the pros and cons of each, we have decided to proceed  with a PET scan to assess whether these minimal abnormalities on CT  scan  represent hypermetabolic areas.  We will arrange a PET scan for the near  future.  If it is positive, then the patient and her husband would like  to continue chemotherapy.  On the other hand, if it is negative, we will  place her into a follow-up program or consider her participating in GOG  protocol 212 (maintenance therapy with Taxol or Xyotax vs observation).      De Blanch, M.D.  Electronically Signed     DC/MEDQ  D:  04/10/2009  T:  04/11/2009  Job:  [id number]   cc:   Lennis P. Darrold Span, M.D.  Fax: [phone]   Gretta Cool, M.D.  Fax: [phone]   [name]. [name], MD  [address]  [place]  [place] [address]   Telford Nab, R.N.  [address]  [place], [place] [address]

## 2011-01-18 NOTE — Consult Note (Signed)
NAMEDONYALE, Shepherd             ACCOUNT NO.:  [id number]   MEDICAL RECORD NO.:  [id number]          PATIENT TYPE:  INP   LOCATION:  1538                         FACILITY:  [facility]   PHYSICIAN:  Paola A. Duard Brady, MD    DATE OF BIRTH:  November 23, 1945   DATE OF CONSULTATION:  10/14/2008  DATE OF DISCHARGE:  10/14/2008                                 CONSULTATION   [name] is a very pleasant 65 year old who recently underwent  exploratory laparotomy, TAH-BSO, omentectomy, appendectomy and optimal  tumor debulking with the largest residual tumor being just at 1 cm  involving the surface of the cecum.  Her surgery was on October 07, 2008.  She was just discharged today.  Operative findings included an  omental cake measuring 18 x 5 cm which was adherent to the anterior  abdominal wall with no invasion into the fascia.  There was significant  peritoneal studding overlying the diaphragm involving the small bowel,  small bowel mesentery, surface of the descending and ascending colon, as  well as the appendix.  There was thickening along the anterior bladder  flap.  There were bilateral atrophic-appearing ovaries.  Again, as  stated, the largest plaque measured 1 cm involving the surface of the  cecum.   Final pathology came back most consistent with a primary fallopian tube  carcinoma, stage III C.  There was gradual transition from carcinoma in  situ to dysplasia through the fallopian tube.  She comes in today for  staple removal and for discussion of pathology.  She is overall doing  fairly well.  She has yet to have a bowel movement but is passing gas  and tolerating a regular diet, though she is eating currently small  amounts.  Her pain is well controlled.   She has an appointment with Dr. Darrold Span on October 22, 2008 for  discussion of initiation of therapy.   Based on the one lesion being just at 1 cm, I do not believe that she  would necessarily be a candidate for intraperitoneal  chemotherapy and  would favor intravenous administration.  I discussed her pathology with  the patient and her husband.  They did wish to have a copy of the  pathology report which was given to them.  I discussed in general terms  that chemotherapy would consist of at least six cycles of paclitaxel and  carboplatin, and that each cycle was one treatment every 3 weeks.  At  the completion, there may be some opportunity to consider consolidation,  and that was discussed with them. as well.  Their questions were  answered.   PHYSICAL EXAMINATION:  VITAL SIGNS:  Blood pressure 158/83, pulse 92,  respirations 18, temperature 38.4.  GENERAL:  A well-appearing female in no acute distress.  ABDOMEN:  A vertical midline incision with staples.  The staples were  removed.  There was one small area of a skin mismatch with very small  deviation.  This was repaired with Steri-Strips.  Abdomen is soft and  nontender.  There is postoperative ecchymosis.   ASSESSMENT:  A 65 year old with stage III C  fallopian tube carcinoma who  is doing fairly well postoperatively.  I would consider her optimally  debulked, as the lesion is just at 1 cm.  She will follow up with Dr.  Darrold Span on  October 22, 2008 for a discussion of therapy.  Her questions were  elicited and answered to her satisfaction.  She knows that we will be  seeing her during her chemotherapy and that they are welcome to call me  if they have any questions.      Paola A. Duard Brady, MD  Electronically Signed     PAG/MEDQ  D:  10/14/2008  T:  10/14/2008  Job:  [id number]   cc:   Telford Nab, R.N.  [address]  [place], [place] [address]   [name]. [name], MD  [address]  [place]  [place] [address]   Gretta Cool, M.D.  Fax: [phone]   Lennis P. Darrold Span, M.D.  Fax: [phone]

## 2011-02-01 ENCOUNTER — Encounter: Payer: Self-pay | Admitting: Internal Medicine

## 2011-02-03 ENCOUNTER — Ambulatory Visit (INDEPENDENT_AMBULATORY_CARE_PROVIDER_SITE_OTHER): Payer: Medicare Other | Admitting: Internal Medicine

## 2011-02-03 ENCOUNTER — Encounter: Payer: Self-pay | Admitting: Internal Medicine

## 2011-02-03 DIAGNOSIS — R7309 Other abnormal glucose: Secondary | ICD-10-CM

## 2011-02-03 DIAGNOSIS — R202 Paresthesia of skin: Secondary | ICD-10-CM

## 2011-02-03 DIAGNOSIS — E559 Vitamin D deficiency, unspecified: Secondary | ICD-10-CM

## 2011-02-03 DIAGNOSIS — E538 Deficiency of other specified B group vitamins: Secondary | ICD-10-CM

## 2011-02-03 DIAGNOSIS — R209 Unspecified disturbances of skin sensation: Secondary | ICD-10-CM

## 2011-02-03 DIAGNOSIS — Z8543 Personal history of malignant neoplasm of ovary: Secondary | ICD-10-CM

## 2011-02-03 DIAGNOSIS — R739 Hyperglycemia, unspecified: Secondary | ICD-10-CM

## 2011-02-03 MED ORDER — NASCOBAL 500 MCG/0.1ML NA SOLN: 500.0000 ug | NASAL | Status: DC

## 2011-02-03 NOTE — Assessment & Plan Note (Signed)
On nasal Rx

## 2011-02-03 NOTE — Progress Notes (Signed)
  Subjective:    Patient ID: [name], female    DOB: 1946/02/19, 65 y.o.   MRN: [id number]  HPI   The patient is here to follow up on chronic neuropathy, anxiety, headaches and chronic moderate GERD symptoms controlled with medicines, diet and exercise.   Review of Systems  Constitutional: Positive for diaphoresis. Negative for chills, appetite change, fatigue and unexpected weight change.  HENT: Negative for ear pain and mouth sores.   Eyes: Negative for pain.  Respiratory: Negative for stridor.   Gastrointestinal: Negative for abdominal pain and constipation.  Genitourinary: Negative for dysuria and hematuria.  Musculoskeletal: Negative for back pain.  Skin: Negative for pallor and rash.  Neurological: Positive for numbness. Negative for headaches.  Psychiatric/Behavioral: Negative for sleep disturbance, dysphoric mood and agitation. The patient is nervous/anxious.    Wt Readings from Last 3 Encounters:  02/03/11 208 lb (94.348 kg)  12/03/10 207 lb (93.895 kg)  01/27/10 207 lb (93.895 kg)       Objective:   Physical Exam  Constitutional: She appears well-developed and well-nourished. No distress.       Obese  HENT:  Head: Normocephalic.  Right Ear: External ear normal.  Left Ear: External ear normal.  Nose: Nose normal.  Mouth/Throat: Oropharynx is clear and moist.  Eyes: Conjunctivae are normal. Pupils are equal, round, and reactive to light. Right eye exhibits no discharge. Left eye exhibits no discharge.  Neck: Normal range of motion. Neck supple. No JVD present. No tracheal deviation present. No thyromegaly present.  Cardiovascular: Normal rate, regular rhythm and normal heart sounds.   Pulmonary/Chest: No stridor. No respiratory distress. She has no wheezes.  Abdominal: Soft. Bowel sounds are normal. She exhibits no distension and no mass. There is no tenderness. There is no rebound and no guarding.  Musculoskeletal: She exhibits no edema and no tenderness.    Lymphadenopathy:    She has no cervical adenopathy.  Neurological: She displays normal reflexes. No cranial nerve deficit. She exhibits normal muscle tone. Coordination normal.  Skin: No rash noted. No erythema.  Psychiatric: She has a normal mood and affect. Her behavior is normal. Judgment and thought content normal.        Lab Results  Component Value Date   WBC 4.7 12/03/2010   HGB 11.2* 12/03/2010   HCT 32.6* 12/03/2010   PLT 186.0 12/03/2010   CHOL 184 08/10/2007   TRIG 103 08/10/2007   HDL 39.4 08/10/2007   ALT 31 12/03/2010   AST 30 12/03/2010   NA 144 12/03/2010   K 3.8 12/03/2010   CL 107 12/03/2010   CREATININE 0.8 12/03/2010   BUN 14 12/03/2010   CO2 27 12/03/2010   TSH 1.82 12/03/2010      Assessment & Plan:

## 2011-02-03 NOTE — Assessment & Plan Note (Signed)
F/u with Dr L.

## 2011-02-03 NOTE — Assessment & Plan Note (Signed)
On Rx - daily rx

## 2011-02-03 NOTE — Assessment & Plan Note (Signed)
Neuropathy - on Rx

## 2011-02-28 ENCOUNTER — Other Ambulatory Visit: Payer: Self-pay | Admitting: Oncology

## 2011-02-28 ENCOUNTER — Encounter (HOSPITAL_BASED_OUTPATIENT_CLINIC_OR_DEPARTMENT_OTHER): Payer: Medicare Other | Admitting: Oncology

## 2011-02-28 DIAGNOSIS — C5700 Malignant neoplasm of unspecified fallopian tube: Secondary | ICD-10-CM

## 2011-02-28 LAB — CBC WITH DIFFERENTIAL/PLATELET
BASO%: 0.2 % (ref 0.0–2.0)
EOS%: 2.1 % (ref 0.0–7.0)
Eosinophils Absolute: 0.1 10*3/uL (ref 0.0–0.5)
MCH: 29.8 pg (ref 25.1–34.0)
MCHC: 34.9 g/dL (ref 31.5–36.0)
MCV: 85.4 fL (ref 79.5–101.0)
MONO%: 7.4 % (ref 0.0–14.0)
NEUT#: 2.8 10*3/uL (ref 1.5–6.5)
RBC: 3.57 10*6/uL — ABNORMAL LOW (ref 3.70–5.45)
RDW: 13.9 % (ref 11.2–14.5)

## 2011-02-28 LAB — COMPREHENSIVE METABOLIC PANEL
AST: 21 U/L (ref 0–37)
Albumin: 4.1 g/dL (ref 3.5–5.2)
Alkaline Phosphatase: 73 U/L (ref 39–117)
Potassium: 3.4 mEq/L — ABNORMAL LOW (ref 3.5–5.3)
Sodium: 144 mEq/L (ref 135–145)
Total Protein: 6.6 g/dL (ref 6.0–8.3)

## 2011-03-07 ENCOUNTER — Other Ambulatory Visit: Payer: Self-pay | Admitting: Oncology

## 2011-03-07 DIAGNOSIS — D649 Anemia, unspecified: Secondary | ICD-10-CM

## 2011-03-23 ENCOUNTER — Encounter (HOSPITAL_BASED_OUTPATIENT_CLINIC_OR_DEPARTMENT_OTHER): Payer: Medicare Other | Admitting: Oncology

## 2011-03-23 ENCOUNTER — Other Ambulatory Visit: Payer: Self-pay | Admitting: Oncology

## 2011-03-23 DIAGNOSIS — Z23 Encounter for immunization: Secondary | ICD-10-CM

## 2011-03-23 DIAGNOSIS — C5700 Malignant neoplasm of unspecified fallopian tube: Secondary | ICD-10-CM

## 2011-03-23 LAB — CBC WITH DIFFERENTIAL/PLATELET
BASO%: 0.4 % (ref 0.0–2.0)
Basophils Absolute: 0 10*3/uL (ref 0.0–0.1)
EOS%: 2.6 % (ref 0.0–7.0)
HCT: 33.2 % — ABNORMAL LOW (ref 34.8–46.6)
HGB: 11.3 g/dL — ABNORMAL LOW (ref 11.6–15.9)
LYMPH%: 28.3 % (ref 14.0–49.7)
MCH: 28.5 pg (ref 25.1–34.0)
MCHC: 34 g/dL (ref 31.5–36.0)
MONO#: 0.4 10*3/uL (ref 0.1–0.9)
NEUT%: 61.4 % (ref 38.4–76.8)
Platelets: 180 10*3/uL (ref 145–400)

## 2011-03-25 ENCOUNTER — Ambulatory Visit (AMBULATORY_SURGERY_CENTER): Payer: Medicare Other | Admitting: *Deleted

## 2011-03-25 VITALS — Ht 64.0 in | Wt 208.5 lb

## 2011-03-25 DIAGNOSIS — Z1211 Encounter for screening for malignant neoplasm of colon: Secondary | ICD-10-CM

## 2011-03-25 MED ORDER — PEG-KCL-NACL-NASULF-NA ASC-C 100 G PO SOLR: ORAL | Status: DC

## 2011-04-08 ENCOUNTER — Encounter: Payer: Self-pay | Admitting: Internal Medicine

## 2011-04-08 ENCOUNTER — Ambulatory Visit (AMBULATORY_SURGERY_CENTER): Payer: Medicare Other | Admitting: Internal Medicine

## 2011-04-08 VITALS — BP 146/73 | HR 85 | Resp 18 | Ht 64.0 in | Wt 208.0 lb

## 2011-04-08 DIAGNOSIS — Z8543 Personal history of malignant neoplasm of ovary: Secondary | ICD-10-CM

## 2011-04-08 DIAGNOSIS — D126 Benign neoplasm of colon, unspecified: Secondary | ICD-10-CM

## 2011-04-08 DIAGNOSIS — K635 Polyp of colon: Secondary | ICD-10-CM

## 2011-04-08 DIAGNOSIS — Z1211 Encounter for screening for malignant neoplasm of colon: Secondary | ICD-10-CM

## 2011-04-08 DIAGNOSIS — Z1231 Encounter for screening mammogram for malignant neoplasm of breast: Secondary | ICD-10-CM

## 2011-04-08 HISTORY — DX: Benign neoplasm of colon, unspecified: D12.6

## 2011-04-08 MED ORDER — SODIUM CHLORIDE 0.9 % IV SOLN: 500.0000 mL | INTRAVENOUS | Status: DC

## 2011-04-08 NOTE — Patient Instructions (Signed)
Please read the handouts given to you by your recovery room nurse.    Try to increase the fiber in your diet due to your hemorrhoids.  Your polyp results will be mailed to you within 2 weeks.   You may resume your routine medications today.   If you have any questions, please call us at [phone].

## 2011-04-11 ENCOUNTER — Telehealth: Payer: Self-pay

## 2011-04-11 NOTE — Telephone Encounter (Signed)

## 2011-04-12 ENCOUNTER — Encounter: Payer: Self-pay | Admitting: Internal Medicine

## 2011-05-18 ENCOUNTER — Other Ambulatory Visit: Payer: Self-pay | Admitting: Gynecology

## 2011-05-30 ENCOUNTER — Other Ambulatory Visit (INDEPENDENT_AMBULATORY_CARE_PROVIDER_SITE_OTHER): Payer: Medicare Other

## 2011-05-30 DIAGNOSIS — E538 Deficiency of other specified B group vitamins: Secondary | ICD-10-CM

## 2011-05-30 DIAGNOSIS — E559 Vitamin D deficiency, unspecified: Secondary | ICD-10-CM

## 2011-05-30 DIAGNOSIS — R7309 Other abnormal glucose: Secondary | ICD-10-CM

## 2011-05-30 DIAGNOSIS — R209 Unspecified disturbances of skin sensation: Secondary | ICD-10-CM

## 2011-05-30 DIAGNOSIS — R202 Paresthesia of skin: Secondary | ICD-10-CM

## 2011-05-30 DIAGNOSIS — R739 Hyperglycemia, unspecified: Secondary | ICD-10-CM

## 2011-05-30 LAB — COMPREHENSIVE METABOLIC PANEL
ALT: 22 U/L (ref 0–35)
AST: 21 U/L (ref 0–37)
Albumin: 4 g/dL (ref 3.5–5.2)
BUN: 13 mg/dL (ref 6–23)
CO2: 28 mEq/L (ref 19–32)
Calcium: 8.8 mg/dL (ref 8.4–10.5)
Chloride: 105 mEq/L (ref 96–112)
Creatinine, Ser: 0.9 mg/dL (ref 0.4–1.2)
GFR: 70.28 mL/min (ref >60.00)
Potassium: 3.5 mEq/L (ref 3.5–5.1)

## 2011-05-30 LAB — VITAMIN B12: Vitamin B-12: 412 pg/mL (ref 211–911)

## 2011-05-30 LAB — HEMOGLOBIN A1C: Hgb A1c MFr Bld: 6 % (ref 4.6–6.5)

## 2011-06-03 ENCOUNTER — Ambulatory Visit (INDEPENDENT_AMBULATORY_CARE_PROVIDER_SITE_OTHER): Payer: Medicare Other | Admitting: Internal Medicine

## 2011-06-03 ENCOUNTER — Encounter: Payer: Self-pay | Admitting: Internal Medicine

## 2011-06-03 DIAGNOSIS — E538 Deficiency of other specified B group vitamins: Secondary | ICD-10-CM

## 2011-06-03 DIAGNOSIS — F411 Generalized anxiety disorder: Secondary | ICD-10-CM

## 2011-06-03 DIAGNOSIS — M545 Low back pain: Secondary | ICD-10-CM

## 2011-06-03 DIAGNOSIS — R202 Paresthesia of skin: Secondary | ICD-10-CM

## 2011-06-03 DIAGNOSIS — R209 Unspecified disturbances of skin sensation: Secondary | ICD-10-CM

## 2011-06-03 DIAGNOSIS — F419 Anxiety disorder, unspecified: Secondary | ICD-10-CM | POA: Insufficient documentation

## 2011-06-03 MED ORDER — COLESEVELAM HCL 3.75 G PO PACK: 1.0000 | PACK | ORAL | Status: DC

## 2011-06-03 MED ORDER — METFORMIN HCL 500 MG PO TABS: 500.0000 mg | ORAL_TABLET | Freq: Two times a day (BID) | ORAL | Status: DC

## 2011-06-03 MED ORDER — DULOXETINE HCL 60 MG PO CPEP: 60.0000 mg | ORAL_CAPSULE | Freq: Every day | ORAL | Status: DC

## 2011-06-03 NOTE — Assessment & Plan Note (Signed)
Start stretching Cymbalta 1 a day

## 2011-06-03 NOTE — Assessment & Plan Note (Signed)
Treat elev glu

## 2011-06-03 NOTE — Progress Notes (Signed)
  Subjective:    Patient ID: [name], female    DOB: August 08, 1946, 65 y.o.   MRN: [id number]  HPI    The patient is here to follow up on chronic depression, anxiety, headaches and chronic moderate fibromyalgia symptoms controlled with medicines, diet and exercise. C/o "wet" and cold feeling in B feet - worse  Review of Systems  Constitutional: Negative for chills, activity change, appetite change, fatigue and unexpected weight change.  HENT: Negative for congestion, mouth sores and sinus pressure.   Eyes: Negative for visual disturbance.  Respiratory: Negative for cough and chest tightness.   Gastrointestinal: Negative for nausea and abdominal pain.  Genitourinary: Negative for frequency, difficulty urinating and vaginal pain.  Musculoskeletal: Positive for back pain and arthralgias. Negative for gait problem.  Skin: Negative for pallor and rash.  Neurological: Negative for dizziness, tremors, weakness, numbness and headaches.  Psychiatric/Behavioral: Negative for confusion and sleep disturbance.       Objective:   Physical Exam  Constitutional: She appears well-developed and well-nourished. No distress.  HENT:  Head: Normocephalic.  Right Ear: External ear normal.  Left Ear: External ear normal.  Nose: Nose normal.  Mouth/Throat: Oropharynx is clear and moist.  Eyes: Conjunctivae are normal. Pupils are equal, round, and reactive to light. Right eye exhibits no discharge. Left eye exhibits no discharge.  Neck: Normal range of motion. Neck supple. No JVD present. No tracheal deviation present. No thyromegaly present.  Cardiovascular: Normal rate, regular rhythm and normal heart sounds.   Pulmonary/Chest: No stridor. No respiratory distress. She has no wheezes.  Abdominal: Soft. Bowel sounds are normal. She exhibits no distension and no mass. There is no tenderness. There is no rebound and no guarding.  Musculoskeletal: She exhibits no edema and no tenderness.    Lymphadenopathy:    She has no cervical adenopathy.  Neurological: She displays normal reflexes. No cranial nerve deficit. She exhibits normal muscle tone. Coordination normal.  Skin: No rash noted. No erythema.  Psychiatric: Her behavior is normal. Judgment and thought content normal.       Subdued   Lab Results  Component Value Date   WBC 4.7 12/03/2010   HGB 11.3* 03/23/2011   HCT 33.2* 03/23/2011   PLT 180 03/23/2011   CHOL 184 08/10/2007   TRIG 103 08/10/2007   HDL 39.4 08/10/2007   ALT 22 05/30/2011   AST 21 05/30/2011   NA 142 05/30/2011   K 3.5 05/30/2011   CL 105 05/30/2011   CREATININE 0.9 05/30/2011   BUN 13 05/30/2011   CO2 28 05/30/2011   TSH 1.82 12/03/2010   HGBA1C 6.0 05/30/2011           Assessment & Plan:

## 2011-06-03 NOTE — Assessment & Plan Note (Signed)
Continue with current prescription therapy as reflected on the Med list.  

## 2011-06-03 NOTE — Patient Instructions (Signed)
Start chair yoga 

## 2011-08-02 ENCOUNTER — Encounter: Payer: Self-pay | Admitting: Internal Medicine

## 2011-08-02 ENCOUNTER — Ambulatory Visit (INDEPENDENT_AMBULATORY_CARE_PROVIDER_SITE_OTHER): Payer: Medicare Other | Admitting: Internal Medicine

## 2011-08-02 VITALS — BP 152/80 | HR 84 | Temp 99.0°F | Resp 16 | Wt 204.0 lb

## 2011-08-02 DIAGNOSIS — F411 Generalized anxiety disorder: Secondary | ICD-10-CM

## 2011-08-02 DIAGNOSIS — R739 Hyperglycemia, unspecified: Secondary | ICD-10-CM

## 2011-08-02 DIAGNOSIS — E538 Deficiency of other specified B group vitamins: Secondary | ICD-10-CM

## 2011-08-02 DIAGNOSIS — F419 Anxiety disorder, unspecified: Secondary | ICD-10-CM

## 2011-08-02 DIAGNOSIS — E559 Vitamin D deficiency, unspecified: Secondary | ICD-10-CM

## 2011-08-02 DIAGNOSIS — R7309 Other abnormal glucose: Secondary | ICD-10-CM

## 2011-08-02 DIAGNOSIS — R209 Unspecified disturbances of skin sensation: Secondary | ICD-10-CM

## 2011-08-02 DIAGNOSIS — M545 Low back pain: Secondary | ICD-10-CM

## 2011-08-02 DIAGNOSIS — R202 Paresthesia of skin: Secondary | ICD-10-CM

## 2011-08-02 NOTE — Assessment & Plan Note (Signed)
resolving

## 2011-08-02 NOTE — Assessment & Plan Note (Signed)
Doing ok.

## 2011-08-02 NOTE — Assessment & Plan Note (Signed)
Continue with current prescription therapy as reflected on the Med list.  

## 2011-08-02 NOTE — Assessment & Plan Note (Signed)
Better; off rx Discussed w/pt and her husband

## 2011-08-02 NOTE — Progress Notes (Signed)
  Subjective:    Patient ID: [name], female    DOB: 06/09/1946, 65 y.o.   MRN: [id number]  HPI  F/u neuropathy, elev glu, elev BP, anxiety. She did not take Cymbalta, Glucophage. Doing well on diet...  Review of Systems  Constitutional: Negative for chills, activity change, appetite change, fatigue and unexpected weight change.  HENT: Negative for congestion, mouth sores and sinus pressure.   Eyes: Negative for visual disturbance.  Respiratory: Negative for cough and chest tightness.   Gastrointestinal: Negative for nausea and abdominal pain.  Genitourinary: Negative for frequency, difficulty urinating and vaginal pain.  Musculoskeletal: Negative for back pain and gait problem.  Skin: Negative for pallor and rash.  Neurological: Positive for numbness. Negative for dizziness, tremors, weakness and headaches.  Psychiatric/Behavioral: Negative for suicidal ideas, confusion, sleep disturbance, dysphoric mood and agitation.   Wt Readings from Last 3 Encounters:  08/02/11 204 lb (92.534 kg)  06/03/11 206 lb (93.441 kg)  04/08/11 208 lb (94.348 kg)       Objective:   Physical Exam  Constitutional: She appears well-developed. No distress.       obese  HENT:  Head: Normocephalic.  Right Ear: External ear normal.  Left Ear: External ear normal.  Nose: Nose normal.  Mouth/Throat: Oropharynx is clear and moist.  Eyes: Conjunctivae are normal. Pupils are equal, round, and reactive to light. Right eye exhibits no discharge. Left eye exhibits no discharge.  Neck: Normal range of motion. Neck supple. No JVD present. No tracheal deviation present. No thyromegaly present.  Cardiovascular: Normal rate, regular rhythm and normal heart sounds.   Pulmonary/Chest: No stridor. No respiratory distress. She has no wheezes.  Abdominal: Soft. Bowel sounds are normal. She exhibits no distension and no mass. There is no tenderness. There is no rebound and no guarding.  Musculoskeletal: She  exhibits no edema and no tenderness.  Lymphadenopathy:    She has no cervical adenopathy.  Neurological: She displays normal reflexes. No cranial nerve deficit. She exhibits normal muscle tone. Coordination normal.  Skin: No rash noted. No erythema.  Psychiatric: She has a normal mood and affect. Her behavior is normal. Judgment and thought content normal.    Lab Results  Component Value Date   WBC 5.4 03/23/2011   HGB 11.3* 03/23/2011   HCT 33.2* 03/23/2011   PLT 180 03/23/2011   GLUCOSE 106* 05/30/2011   CHOL 184 08/10/2007   TRIG 103 08/10/2007   HDL 39.4 08/10/2007   LDLCALC 124* 08/10/2007   ALT 22 05/30/2011   AST 21 05/30/2011   NA 142 05/30/2011   K 3.5 05/30/2011   CL 105 05/30/2011   CREATININE 0.9 05/30/2011   BUN 13 05/30/2011   CO2 28 05/30/2011   TSH 1.82 12/03/2010   HGBA1C 6.0 05/30/2011        Assessment & Plan:

## 2011-08-08 ENCOUNTER — Other Ambulatory Visit: Payer: Medicare Other

## 2011-08-08 ENCOUNTER — Other Ambulatory Visit: Payer: Self-pay | Admitting: Oncology

## 2011-08-08 DIAGNOSIS — Z23 Encounter for immunization: Secondary | ICD-10-CM

## 2011-08-08 LAB — CA 125: CA 125: 8.4 U/mL (ref 0.0–30.2)

## 2011-09-01 ENCOUNTER — Encounter: Payer: Self-pay | Admitting: Gynecologic Oncology

## 2011-09-02 ENCOUNTER — Encounter: Payer: Self-pay | Admitting: Gynecology

## 2011-09-02 ENCOUNTER — Ambulatory Visit: Payer: Medicare Other | Attending: Gynecology | Admitting: Gynecology

## 2011-09-02 VITALS — BP 138/80 | HR 74 | Temp 98.1°F | Resp 16 | Ht 63.5 in | Wt 204.3 lb

## 2011-09-02 DIAGNOSIS — Z8543 Personal history of malignant neoplasm of ovary: Secondary | ICD-10-CM | POA: Insufficient documentation

## 2011-09-02 DIAGNOSIS — I10 Essential (primary) hypertension: Secondary | ICD-10-CM | POA: Insufficient documentation

## 2011-09-02 DIAGNOSIS — E559 Vitamin D deficiency, unspecified: Secondary | ICD-10-CM | POA: Insufficient documentation

## 2011-09-02 DIAGNOSIS — C5700 Malignant neoplasm of unspecified fallopian tube: Secondary | ICD-10-CM | POA: Insufficient documentation

## 2011-09-02 DIAGNOSIS — Z7982 Long term (current) use of aspirin: Secondary | ICD-10-CM | POA: Insufficient documentation

## 2011-09-02 DIAGNOSIS — Z79899 Other long term (current) drug therapy: Secondary | ICD-10-CM | POA: Insufficient documentation

## 2011-09-02 DIAGNOSIS — E538 Deficiency of other specified B group vitamins: Secondary | ICD-10-CM | POA: Insufficient documentation

## 2011-09-02 DIAGNOSIS — K589 Irritable bowel syndrome without diarrhea: Secondary | ICD-10-CM | POA: Insufficient documentation

## 2011-09-02 NOTE — Progress Notes (Signed)
Consult Note: Gyn-Onc   [name] 65 y.o. female  Chief Complaint  Patient presents with  . Fallopian tube cancer    Follow up    Interval History: Since her last visit the patient has done well. She denies any GI or GU symptoms has no pelvic pain pressure vaginal bleeding or discharge. Her functional status is excellent. She is scheduled to have mammograms in February 2013. In August of 2012 she had a colonoscopy revealing benign polyps. She had a CA 125 on 08/08/2011 which is 8.4 units per mL. This is stable from previous values.  HPI: Stage III fallopian tube carcinoma undergoing initial surgical debulking February 2010. She was optimally debulked with minimal disease on peritoneal surfaces. She received 6 cycles of carboplatin and Taxol chemotherapy completed in June 2010. He CT scan and a PET scan at that time showed no evidence disease. The patient has been followed since that time with normal exams and CA 125 values.  Allergies  Allergen Reactions  . Boniva (Ibandronate Sodium)     cramp  . Calcium Channel Blockers     Upset stomach  . Compazine     Daughter reacts to compazine/pt does not want to take    Past Medical History  Diagnosis Date  . HTN (hypertension)   . LBP (low back pain)   . Menopause   . Vitamin B12 deficiency   . IBS (irritable bowel syndrome)   . Vitamin D deficiency   . GERD (gastroesophageal reflux disease)   . Ovarian cancer 09/2008    Dr Janae Sauce  . Glaucoma (increased eye pressure)     Past Surgical History  Procedure Date  . Ovarian cancer debulking 09/2008  . Hemorrhoid surgery 2001    Current Outpatient Prescriptions  Medication Sig Dispense Refill  . aspirin 81 MG EC tablet Take 81 mg by mouth daily.        . bimatoprost (LUMIGAN) 0.03 % ophthalmic solution Place 1 drop into both eyes at bedtime.        . Cholecalciferol (VITAMIN D3 SUPER STRENGTH) 2000 UNITS TABS Take by mouth daily.       . cloNIDine (CATAPRES) 0.1 MG tablet  Take 0.1 mg by mouth at bedtime.        . gabapentin (NEURONTIN) 100 MG capsule Take 1 capsule (100 mg total) by mouth 3 (three) times daily as needed.  90 capsule  6  . NASCOBAL 500 MCG/0.1ML SOLN Place 0.1 mLs (500 mcg total) into the nose once a week.  6.9 mL  3  . RABEprazole (ACIPHEX) 20 MG tablet Take 20 mg by mouth daily.        . timolol (TIMOPTIC-XR) 0.5 % ophthalmic gel-forming       . Cholecalciferol (VITAMIN D3) 50000 UNITS CAPS Take 1 capsule by mouth once a week.  6 capsule  0  . Colesevelam HCl (WELCHOL) 3.75 G PACK Take 1 each by mouth 1 day or 1 dose.  30 each  11  . DULoxetine (CYMBALTA) 60 MG capsule Take 1 capsule (60 mg total) by mouth daily.  30 capsule  5  . metFORMIN (GLUCOPHAGE) 500 MG tablet Take 1 tablet (500 mg total) by mouth 2 (two) times daily with a meal.  30 tablet  11    History   Social History  . Marital Status: Married    Spouse Name: N/A    Number of Children: N/A  . Years of Education: N/A   Occupational History  .  Homemaker    Social History Main Topics  . Smoking status: Never Smoker   . Smokeless tobacco: Not on file  . Alcohol Use: No  . Drug Use: No  . Sexually Active: Not on file   Other Topics Concern  . Not on file   Social History Narrative  . No narrative on file    Family History  Problem Relation Age of Onset  . Alzheimer's disease Mother   . Mental illness Mother 29    Alzheimer's  . Cancer Father 70    lung ca  . Lung cancer Father     Review of Systems: 10 point review is negative except as noted above  Vitals: Blood pressure 138/80, pulse 74, temperature 98.1 F (36.7 C), resp. rate 16, height 5' 3.5" (1.613 m), weight 204 lb 4.8 oz (92.67 kg).  Physical Exam: HEENT is May negative area neck supple without thyromegaly  There is no supraventricular or inguinal adenopathy  The abdomen is obese soft nontender no masses, organomegaly ascites or hernias are noted.  Pelvic exam:  EGBUS vagina bladder urethra  are normal  cervix and uterus are surgically absent.  Adnexa without masses  Rectovaginal exam confirms  Lower extremities are without edema or varicosities  Assessment/Plan: Stage III fallopian tube carcinoma 2010. No evidence of disease. Patient has a normal CA 125 and normal examination today.  The patient return to see Dr. Darrold Span in June 2013 I will see her in December 2013. We'll continue to obtain CA 125 values at each visit.   Jeannette Corpus, MD 09/02/2011, 10:26 AM                         Consult Note: Gyn-Onc   [name] 65 y.o. female  Chief Complaint  Patient presents with  . Fallopian tube cancer    Follow up    Interval History:   HPI:  Allergies  Allergen Reactions  . Boniva (Ibandronate Sodium)     cramp  . Calcium Channel Blockers     Upset stomach  . Compazine     Daughter reacts to compazine/pt does not want to take    Past Medical History  Diagnosis Date  . HTN (hypertension)   . LBP (low back pain)   . Menopause   . Vitamin B12 deficiency   . IBS (irritable bowel syndrome)   . Vitamin D deficiency   . GERD (gastroesophageal reflux disease)   . Ovarian cancer 09/2008    Dr Janae Sauce  . Glaucoma (increased eye pressure)     Past Surgical History  Procedure Date  . Ovarian cancer debulking 09/2008  . Hemorrhoid surgery 2001    Current Outpatient Prescriptions  Medication Sig Dispense Refill  . aspirin 81 MG EC tablet Take 81 mg by mouth daily.        . bimatoprost (LUMIGAN) 0.03 % ophthalmic solution Place 1 drop into both eyes at bedtime.        . Cholecalciferol (VITAMIN D3 SUPER STRENGTH) 2000 UNITS TABS Take by mouth daily.       . cloNIDine (CATAPRES) 0.1 MG tablet Take 0.1 mg by mouth at bedtime.        . gabapentin (NEURONTIN) 100 MG capsule Take 1 capsule (100 mg total) by mouth 3 (three) times daily as needed.  90 capsule  6  . NASCOBAL 500 MCG/0.1ML SOLN Place 0.1 mLs (500 mcg total)  into the nose once a week.  6.9  mL  3  . RABEprazole (ACIPHEX) 20 MG tablet Take 20 mg by mouth daily.        . timolol (TIMOPTIC-XR) 0.5 % ophthalmic gel-forming       . Cholecalciferol (VITAMIN D3) 50000 UNITS CAPS Take 1 capsule by mouth once a week.  6 capsule  0  . Colesevelam HCl (WELCHOL) 3.75 G PACK Take 1 each by mouth 1 day or 1 dose.  30 each  11  . DULoxetine (CYMBALTA) 60 MG capsule Take 1 capsule (60 mg total) by mouth daily.  30 capsule  5  . metFORMIN (GLUCOPHAGE) 500 MG tablet Take 1 tablet (500 mg total) by mouth 2 (two) times daily with a meal.  30 tablet  11    History   Social History  . Marital Status: Married    Spouse Name: N/A    Number of Children: N/A  . Years of Education: N/A   Occupational History  . Homemaker    Social History Main Topics  . Smoking status: Never Smoker   . Smokeless tobacco: Not on file  . Alcohol Use: No  . Drug Use: No  . Sexually Active: Not on file   Other Topics Concern  . Not on file   Social History Narrative  . No narrative on file    Family History  Problem Relation Age of Onset  . Alzheimer's disease Mother   . Mental illness Mother 75    Alzheimer's  . Cancer Father 13    lung ca  . Lung cancer Father     Review of Systems:  Vitals: Blood pressure 138/80, pulse 74, temperature 98.1 F (36.7 C), resp. rate 16, height 5' 3.5" (1.613 m), weight 204 lb 4.8 oz (92.67 kg).  Physical Exam:  Assessment/Plan:   Jeannette Corpus, MD 09/02/2011, 10:26 AM

## 2011-09-02 NOTE — Patient Instructions (Signed)
Return in one year.

## 2011-09-14 ENCOUNTER — Other Ambulatory Visit: Payer: Self-pay | Admitting: *Deleted

## 2011-09-14 MED ORDER — RABEPRAZOLE SODIUM 20 MG PO TBEC: 20.0000 mg | DELAYED_RELEASE_TABLET | Freq: Every day | ORAL | Status: DC

## 2011-10-08 ENCOUNTER — Telehealth: Payer: Self-pay | Admitting: Oncology

## 2011-10-08 NOTE — Telephone Encounter (Signed)
S/w the pt's husband and he is aware of the June 2013 appts

## 2011-11-02 ENCOUNTER — Other Ambulatory Visit: Payer: Self-pay | Admitting: Oncology

## 2011-11-02 DIAGNOSIS — Z1231 Encounter for screening mammogram for malignant neoplasm of breast: Secondary | ICD-10-CM

## 2011-11-24 ENCOUNTER — Ambulatory Visit
Admission: RE | Admit: 2011-11-24 | Discharge: 2011-11-24 | Disposition: A | Discharge status: Home / Self Care | Payer: Medicare Other | Source: Ambulatory Visit | Attending: Oncology | Admitting: Oncology

## 2011-11-24 DIAGNOSIS — Z1231 Encounter for screening mammogram for malignant neoplasm of breast: Secondary | ICD-10-CM

## 2011-12-13 ENCOUNTER — Telehealth: Payer: Self-pay | Admitting: *Deleted

## 2011-12-13 NOTE — Telephone Encounter (Signed)
PA for Aciphex is approved 11/22/11- 12/12/13. Quantity limit override is valid from 11/22/11-06/10/12. Left detailed mess informing pt of this.

## 2012-01-31 ENCOUNTER — Ambulatory Visit: Payer: Medicare Other | Admitting: Internal Medicine

## 2012-02-02 DIAGNOSIS — H40059 Ocular hypertension, unspecified eye: Secondary | ICD-10-CM | POA: Diagnosis not present

## 2012-02-03 ENCOUNTER — Telehealth: Payer: Self-pay | Admitting: Oncology

## 2012-02-03 NOTE — Telephone Encounter (Signed)
Called pt and left message regarding appt moved to earlier time on 02/06/12 lab and MD

## 2012-02-06 ENCOUNTER — Telehealth: Payer: Self-pay | Admitting: Oncology

## 2012-02-06 ENCOUNTER — Other Ambulatory Visit (HOSPITAL_BASED_OUTPATIENT_CLINIC_OR_DEPARTMENT_OTHER): Payer: Medicare Other

## 2012-02-06 ENCOUNTER — Encounter: Payer: Self-pay | Admitting: Oncology

## 2012-02-06 ENCOUNTER — Ambulatory Visit (HOSPITAL_BASED_OUTPATIENT_CLINIC_OR_DEPARTMENT_OTHER): Payer: Medicare Other | Admitting: Oncology

## 2012-02-06 VITALS — BP 130/69 | HR 89 | Temp 98.2°F | Ht 63.5 in | Wt 198.6 lb

## 2012-02-06 DIAGNOSIS — E119 Type 2 diabetes mellitus without complications: Secondary | ICD-10-CM

## 2012-02-06 DIAGNOSIS — Z23 Encounter for immunization: Secondary | ICD-10-CM | POA: Diagnosis not present

## 2012-02-06 DIAGNOSIS — C5700 Malignant neoplasm of unspecified fallopian tube: Secondary | ICD-10-CM

## 2012-02-06 DIAGNOSIS — Z8543 Personal history of malignant neoplasm of ovary: Secondary | ICD-10-CM

## 2012-02-06 DIAGNOSIS — E669 Obesity, unspecified: Secondary | ICD-10-CM

## 2012-02-06 LAB — COMPREHENSIVE METABOLIC PANEL
ALT: 18 U/L (ref 0–35)
AST: 19 U/L (ref 0–37)
Albumin: 4.4 g/dL (ref 3.5–5.2)
Alkaline Phosphatase: 84 U/L (ref 39–117)
Chloride: 107 mEq/L (ref 96–112)
Potassium: 3.7 mEq/L (ref 3.5–5.3)
Sodium: 142 mEq/L (ref 135–145)
Total Protein: 7.2 g/dL (ref 6.0–8.3)

## 2012-02-06 LAB — CBC WITH DIFFERENTIAL/PLATELET
BASO%: 0.8 % (ref 0.0–2.0)
EOS%: 1.9 % (ref 0.0–7.0)
HCT: 34.9 % (ref 34.8–46.6)
MCH: 29.4 pg (ref 25.1–34.0)
MCHC: 34.3 g/dL (ref 31.5–36.0)
MONO#: 0.3 10*3/uL (ref 0.1–0.9)
NEUT#: 3.1 10*3/uL (ref 1.5–6.5)
RDW: 13.7 % (ref 11.2–14.5)
lymph#: 1.2 10*3/uL (ref 0.9–3.3)

## 2012-02-06 NOTE — Patient Instructions (Addendum)
Dr Yolande Jolly ~ Dec and Dr Darrold Span again ~ June 2014  Goal: extra 4000 steps per day

## 2012-02-06 NOTE — Progress Notes (Signed)
OFFICE PROGRESS NOTE Date of Visit 02-06-12 Physicians: D.ClarkePearson, A.[name]  INTERVAL HISTORY:  Patient is seen, alone for visit, in scheduled follow up of her history of Stage III fallopian carcinoma, on observation since she completed treatment in June 2010. She had optimal debulking in Feb 2010, with minimal disease on peritoneal surfaces. She received 6 cycles of taxol/carboplatin adjuvantly. She saw Dr Yolande Jolly last in Dec 2012 and will see him again 6 months from this appointment. Last CT and PET were Aug 2010. Patient is followed regularly by Dr Posey Rea, who has recommended oral agents for her diabetes, tho she has not decided to take these. She has tried to watch portion sizes, but is not getting much regular exercise. I have told her about anticipated study which includes extra 4000 steps daily for gyn cancer survivors.She had unremarkable mammograms at [facility] March 2013 and colonoscopy Aug 2012 with benign polyps. Patient has generally been doing well, with no complaints that seem referable to the gyn cancer history or that treatment. She has had some intermittent discomfort and swelling in varicose veins in left calf, not presently bothersome. She has had no respiratory or GI/ urinary symptoms, no bleeding, no recent fever or symptoms of infection, no abdominal or pelvic pain, bowels at baseline. Remainder of 10 point Review of Systems negative.  Objective:  Vital signs in last 24 hours:  BP 130/69  Pulse 89  Temp(Src) 98.2 F (36.8 C) (Oral)  Ht 5' 3.5" (1.613 m)  Wt 198 lb 9.6 oz (90.084 kg)  BMI 34.63 kg/m2 Weight is down 6 lbs from Dec. Easily ambulatory, looks comfortable  HEENT:mucous membranes moist, pharynx normal without lesions. Normal hair pattern. PERRL LymphaticsCervical, supraclavicular, and axillary nodes normal.No inguinal adenopathy Resp: clear to auscultation bilaterally and normal percussion bilaterally Cardio: regular rate and rhythm GI:  soft, non-tender; bowel sounds normal; no masses,  no organomegaly. Surgical incision not remarkable Extremities: extremities normal, atraumatic, no cyanosis or edema Neuro:nonfocal Breasts without dominant mass or other findings of concern, axillae benign  Lab Results:   [facility] 02/06/12 0929  WBC 4.7  HGB 11.9  HCT 34.9  PLT 196  ANC 3.1  BMET  Basename 02/06/12 0929  NA 142  K 3.7  CL 107  CO2 26  GLUCOSE 107*  BUN 13  CREATININE 0.83  CALCIUM 9.4   Remainder of CMET available after visit normal  CA 125 also available after visit 8.4, this stable from 12-12 Studies/Results:  No results found.  Medications: I have reviewed the patient's current medications.  Assessment/Plan: 1.History of stage III fallopian carcinoma, with history as above, now out over 3 years from diagnosis and clinically without evidence of active disease. She will see Dr Yolande Jolly again in Dec (with ca 125 in early Dec prior to that appointment) and I will see her back in a year, or sooner if needed. 2.obesity: encouraged increase in exercise 3.type 2 diabetes 4. HTN, GERD, IBS, glaucoma  Verl Kitson P, MD   02/06/2012, 9:36 PM

## 2012-02-06 NOTE — Telephone Encounter (Signed)
appts made and printed for  Pt pt req to call back for the dec. labs

## 2012-02-07 ENCOUNTER — Encounter: Payer: Self-pay | Admitting: Internal Medicine

## 2012-02-07 ENCOUNTER — Ambulatory Visit (INDEPENDENT_AMBULATORY_CARE_PROVIDER_SITE_OTHER): Payer: Medicare Other | Admitting: Internal Medicine

## 2012-02-07 ENCOUNTER — Telehealth: Payer: Self-pay | Admitting: *Deleted

## 2012-02-07 VITALS — BP 140/90 | HR 80 | Temp 97.3°F | Resp 16 | Wt 199.0 lb

## 2012-02-07 DIAGNOSIS — R209 Unspecified disturbances of skin sensation: Secondary | ICD-10-CM

## 2012-02-07 DIAGNOSIS — R7309 Other abnormal glucose: Secondary | ICD-10-CM

## 2012-02-07 DIAGNOSIS — R202 Paresthesia of skin: Secondary | ICD-10-CM

## 2012-02-07 DIAGNOSIS — I10 Essential (primary) hypertension: Secondary | ICD-10-CM

## 2012-02-07 DIAGNOSIS — F411 Generalized anxiety disorder: Secondary | ICD-10-CM | POA: Diagnosis not present

## 2012-02-07 DIAGNOSIS — E538 Deficiency of other specified B group vitamins: Secondary | ICD-10-CM | POA: Diagnosis not present

## 2012-02-07 DIAGNOSIS — F419 Anxiety disorder, unspecified: Secondary | ICD-10-CM

## 2012-02-07 DIAGNOSIS — E559 Vitamin D deficiency, unspecified: Secondary | ICD-10-CM

## 2012-02-07 DIAGNOSIS — R739 Hyperglycemia, unspecified: Secondary | ICD-10-CM

## 2012-02-07 DIAGNOSIS — Z0000 Encounter for general adult medical examination without abnormal findings: Secondary | ICD-10-CM

## 2012-02-07 MED ORDER — PREGABALIN 50 MG PO CAPS: 50.0000 mg | ORAL_CAPSULE | Freq: Three times a day (TID) | ORAL | Status: DC

## 2012-02-07 NOTE — Telephone Encounter (Signed)
Notified patient of Ca 125 result 8.4 "good" according to Dr Darrold Span, chemistries good as well, glucose 107.

## 2012-02-07 NOTE — Assessment & Plan Note (Addendum)
Not interested in Rx, rather than Aleve. Gabapentin prn. Cymbalta caused dizzinness. Will try Lyrica prn

## 2012-02-07 NOTE — Assessment & Plan Note (Signed)
Continue with current prescription therapy as reflected on the Med list.  

## 2012-02-07 NOTE — Progress Notes (Signed)
Patient ID: [name], female   DOB: [birth year], 66 y.o.   MRN: [id number]  Subjective:    Patient ID: [name], female    DOB: [birth year], 66 y.o.   MRN: [id number]  HPI  F/u neuropathy, elev glu, elev BP, anxiety. She did not take Cymbalta, Glucophage. Doing well on diet...  Review of Systems  Constitutional: Negative for chills, activity change, appetite change, fatigue and unexpected weight change.  HENT: Negative for congestion, mouth sores and sinus pressure.   Eyes: Negative for visual disturbance.  Respiratory: Negative for cough and chest tightness.   Gastrointestinal: Negative for nausea and abdominal pain.  Genitourinary: Negative for frequency, difficulty urinating and vaginal pain.  Musculoskeletal: Negative for back pain and gait problem.  Skin: Negative for pallor and rash.  Neurological: Positive for numbness. Negative for dizziness, tremors, weakness and headaches.  Psychiatric/Behavioral: Negative for suicidal ideas, confusion, sleep disturbance, dysphoric mood and agitation.   Wt Readings from Last 3 Encounters:  02/07/12 199 lb (90.266 kg)  02/06/12 198 lb 9.6 oz (90.084 kg)  09/02/11 204 lb 4.8 oz (92.67 kg)   BP Readings from Last 3 Encounters:  02/07/12 140/90  02/06/12 130/69  09/02/11 138/80       Objective:   Physical Exam  Constitutional: She appears well-developed. No distress.       obese  HENT:  Head: Normocephalic.  Right Ear: External ear normal.  Left Ear: External ear normal.  Nose: Nose normal.  Mouth/Throat: Oropharynx is clear and moist.  Eyes: Conjunctivae are normal. Pupils are equal, round, and reactive to light. Right eye exhibits no discharge. Left eye exhibits no discharge.  Neck: Normal range of motion. Neck supple. No JVD present. No tracheal deviation present. No thyromegaly present.  Cardiovascular: Normal rate, regular rhythm and normal heart sounds.   Pulmonary/Chest: No stridor. No respiratory distress. She  has no wheezes.  Abdominal: Soft. Bowel sounds are normal. She exhibits no distension and no mass. There is no tenderness. There is no rebound and no guarding.  Musculoskeletal: She exhibits no edema and no tenderness.  Lymphadenopathy:    She has no cervical adenopathy.  Neurological: She displays normal reflexes. No cranial nerve deficit. She exhibits normal muscle tone. Coordination normal.  Skin: No rash noted. No erythema.  Psychiatric: She has a normal mood and affect. Her behavior is normal. Judgment and thought content normal.    Lab Results  Component Value Date   WBC 4.7 02/06/2012   HGB 11.9 02/06/2012   HCT 34.9 02/06/2012   PLT 196 02/06/2012   GLUCOSE 107* 02/06/2012   CHOL 184 08/10/2007   TRIG 103 08/10/2007   HDL 39.4 08/10/2007   LDLCALC 124* 08/10/2007   ALT 18 02/06/2012   AST 19 02/06/2012   NA 142 02/06/2012   K 3.7 02/06/2012   CL 107 02/06/2012   CREATININE 0.83 02/06/2012   BUN 13 02/06/2012   CO2 26 02/06/2012   TSH 1.82 12/03/2010   HGBA1C 6.0 05/30/2011   CA125 - stable     Assessment & Plan:

## 2012-02-07 NOTE — Assessment & Plan Note (Signed)
Discussed.

## 2012-06-11 ENCOUNTER — Other Ambulatory Visit: Payer: Self-pay | Admitting: Gynecology

## 2012-06-11 DIAGNOSIS — I10 Essential (primary) hypertension: Secondary | ICD-10-CM | POA: Diagnosis not present

## 2012-06-11 DIAGNOSIS — R350 Frequency of micturition: Secondary | ICD-10-CM | POA: Diagnosis not present

## 2012-06-11 DIAGNOSIS — Z9189 Other specified personal risk factors, not elsewhere classified: Secondary | ICD-10-CM | POA: Diagnosis not present

## 2012-06-11 DIAGNOSIS — M949 Disorder of cartilage, unspecified: Secondary | ICD-10-CM | POA: Diagnosis not present

## 2012-07-24 ENCOUNTER — Telehealth: Payer: Self-pay | Admitting: *Deleted

## 2012-07-24 NOTE — Telephone Encounter (Signed)
R'cd from [facility] for PA for Aciphex-PA approved 07/03/2012-01/20/2013.

## 2012-08-07 ENCOUNTER — Ambulatory Visit: Payer: Medicare Other | Admitting: Internal Medicine

## 2012-08-07 ENCOUNTER — Other Ambulatory Visit (INDEPENDENT_AMBULATORY_CARE_PROVIDER_SITE_OTHER): Payer: Medicare Other

## 2012-08-07 ENCOUNTER — Ambulatory Visit (INDEPENDENT_AMBULATORY_CARE_PROVIDER_SITE_OTHER): Payer: Medicare Other | Admitting: Internal Medicine

## 2012-08-07 ENCOUNTER — Encounter: Payer: Self-pay | Admitting: Internal Medicine

## 2012-08-07 ENCOUNTER — Telehealth: Payer: Self-pay | Admitting: Internal Medicine

## 2012-08-07 VITALS — BP 130/72 | HR 72 | Temp 98.2°F | Resp 16 | Wt 195.0 lb

## 2012-08-07 DIAGNOSIS — E538 Deficiency of other specified B group vitamins: Secondary | ICD-10-CM

## 2012-08-07 DIAGNOSIS — R202 Paresthesia of skin: Secondary | ICD-10-CM

## 2012-08-07 DIAGNOSIS — Z0000 Encounter for general adult medical examination without abnormal findings: Secondary | ICD-10-CM

## 2012-08-07 DIAGNOSIS — F411 Generalized anxiety disorder: Secondary | ICD-10-CM

## 2012-08-07 DIAGNOSIS — I10 Essential (primary) hypertension: Secondary | ICD-10-CM

## 2012-08-07 DIAGNOSIS — Z23 Encounter for immunization: Secondary | ICD-10-CM | POA: Diagnosis not present

## 2012-08-07 DIAGNOSIS — R739 Hyperglycemia, unspecified: Secondary | ICD-10-CM

## 2012-08-07 DIAGNOSIS — R209 Unspecified disturbances of skin sensation: Secondary | ICD-10-CM | POA: Diagnosis not present

## 2012-08-07 DIAGNOSIS — E559 Vitamin D deficiency, unspecified: Secondary | ICD-10-CM

## 2012-08-07 DIAGNOSIS — R7309 Other abnormal glucose: Secondary | ICD-10-CM

## 2012-08-07 DIAGNOSIS — M545 Low back pain: Secondary | ICD-10-CM

## 2012-08-07 DIAGNOSIS — F419 Anxiety disorder, unspecified: Secondary | ICD-10-CM

## 2012-08-07 LAB — BASIC METABOLIC PANEL
BUN: 15 mg/dL (ref 6–23)
CO2: 28 mEq/L (ref 19–32)
Calcium: 9.1 mg/dL (ref 8.4–10.5)
Chloride: 106 mEq/L (ref 96–112)
Creatinine, Ser: 0.8 mg/dL (ref 0.4–1.2)
Glucose, Bld: 114 mg/dL — ABNORMAL HIGH (ref 70–99)

## 2012-08-07 LAB — LIPID PANEL: VLDL: 22.4 mg/dL (ref 0.0–40.0)

## 2012-08-07 LAB — CBC WITH DIFFERENTIAL/PLATELET
Basophils Absolute: 0 10*3/uL (ref 0.0–0.1)
Basophils Relative: 0.7 % (ref 0.0–3.0)
Eosinophils Absolute: 0.1 10*3/uL (ref 0.0–0.7)
MCHC: 33.7 g/dL (ref 30.0–36.0)
MCV: 86.2 fl (ref 78.0–100.0)
Monocytes Absolute: 0.4 10*3/uL (ref 0.1–1.0)
Neutrophils Relative %: 64.2 % (ref 43.0–77.0)
Platelets: 210 10*3/uL (ref 150.0–400.0)
RDW: 13.9 % (ref 11.5–14.6)

## 2012-08-07 LAB — HEPATIC FUNCTION PANEL
Bilirubin, Direct: 0.1 mg/dL (ref 0.0–0.3)
Total Bilirubin: 0.8 mg/dL (ref 0.3–1.2)
Total Protein: 7.6 g/dL (ref 6.0–8.3)

## 2012-08-07 LAB — TSH: TSH: 1.26 u[IU]/mL (ref 0.35–5.50)

## 2012-08-07 LAB — LIPASE: Lipase: 25 U/L (ref 11.0–59.0)

## 2012-08-07 MED ORDER — TRAMADOL HCL 50 MG PO TABS: 50.0000 mg | ORAL_TABLET | Freq: Two times a day (BID) | ORAL | Status: DC | PRN

## 2012-08-07 NOTE — Telephone Encounter (Signed)
[name], please, inform patient that all labs are ok. No change in meds Thx

## 2012-08-07 NOTE — Assessment & Plan Note (Signed)
Continue with current prescription therapy as reflected on the Med list.  

## 2012-08-07 NOTE — Progress Notes (Signed)
   Subjective:    Patient ID: [name], female    DOB: 02-19-1946, 66 y.o.   MRN: [id number]  HPI  F/u neuropathy, elev glu, elev BP, anxiety. She did not take Cymbalta, Glucophage. Doing well on diet...  Review of Systems  Constitutional: Negative for chills, activity change, appetite change, fatigue and unexpected weight change.  HENT: Negative for congestion, mouth sores and sinus pressure.   Eyes: Negative for visual disturbance.  Respiratory: Negative for cough and chest tightness.   Gastrointestinal: Negative for nausea and abdominal pain.  Genitourinary: Negative for frequency, difficulty urinating and vaginal pain.  Musculoskeletal: Negative for back pain and gait problem.  Skin: Negative for pallor and rash.  Neurological: Positive for numbness. Negative for dizziness, tremors, weakness and headaches.  Psychiatric/Behavioral: Negative for suicidal ideas, confusion, sleep disturbance, dysphoric mood and agitation.   Wt Readings from Last 3 Encounters:  08/07/12 195 lb (88.451 kg)  02/07/12 199 lb (90.266 kg)  02/06/12 198 lb 9.6 oz (90.084 kg)   BP Readings from Last 3 Encounters:  08/07/12 130/72  02/07/12 140/90  02/06/12 130/69       Objective:   Physical Exam  Constitutional: She appears well-developed. No distress.       obese  HENT:  Head: Normocephalic.  Right Ear: External ear normal.  Left Ear: External ear normal.  Nose: Nose normal.  Mouth/Throat: Oropharynx is clear and moist.  Eyes: Conjunctivae normal are normal. Pupils are equal, round, and reactive to light. Right eye exhibits no discharge. Left eye exhibits no discharge.  Neck: Normal range of motion. Neck supple. No JVD present. No tracheal deviation present. No thyromegaly present.  Cardiovascular: Normal rate, regular rhythm and normal heart sounds.   Pulmonary/Chest: No stridor. No respiratory distress. She has no wheezes.  Abdominal: Soft. Bowel sounds are normal. She exhibits no  distension and no mass. There is no tenderness. There is no rebound and no guarding.  Musculoskeletal: She exhibits no edema and no tenderness.  Lymphadenopathy:    She has no cervical adenopathy.  Neurological: She displays normal reflexes. No cranial nerve deficit. She exhibits normal muscle tone. Coordination normal.  Skin: No rash noted. No erythema.  Psychiatric: She has a normal mood and affect. Her behavior is normal. Judgment and thought content normal.    Lab Results  Component Value Date   WBC 4.7 02/06/2012   HGB 11.9 02/06/2012   HCT 34.9 02/06/2012   PLT 196 02/06/2012   GLUCOSE 107* 02/06/2012   CHOL 184 08/10/2007   TRIG 103 08/10/2007   HDL 39.4 08/10/2007   LDLCALC 124* 08/10/2007   ALT 18 02/06/2012   AST 19 02/06/2012   NA 142 02/06/2012   K 3.7 02/06/2012   CL 107 02/06/2012   CREATININE 0.83 02/06/2012   BUN 13 02/06/2012   CO2 26 02/06/2012   TSH 1.82 12/03/2010   HGBA1C 6.0 05/30/2011   CA125 - stable     Assessment & Plan:

## 2012-08-07 NOTE — Assessment & Plan Note (Signed)
Will try tramadol

## 2012-08-08 ENCOUNTER — Encounter: Payer: Self-pay | Admitting: Internal Medicine

## 2012-08-08 NOTE — Telephone Encounter (Signed)
Pt informed

## 2012-08-09 DIAGNOSIS — H04129 Dry eye syndrome of unspecified lacrimal gland: Secondary | ICD-10-CM | POA: Diagnosis not present

## 2012-08-09 DIAGNOSIS — H40059 Ocular hypertension, unspecified eye: Secondary | ICD-10-CM | POA: Diagnosis not present

## 2012-08-09 DIAGNOSIS — H52209 Unspecified astigmatism, unspecified eye: Secondary | ICD-10-CM | POA: Diagnosis not present

## 2012-08-09 DIAGNOSIS — H2510 Age-related nuclear cataract, unspecified eye: Secondary | ICD-10-CM | POA: Diagnosis not present

## 2012-08-13 ENCOUNTER — Encounter: Payer: Self-pay | Admitting: Internal Medicine

## 2012-09-13 ENCOUNTER — Other Ambulatory Visit: Payer: Self-pay | Admitting: Internal Medicine

## 2012-10-05 ENCOUNTER — Ambulatory Visit (HOSPITAL_BASED_OUTPATIENT_CLINIC_OR_DEPARTMENT_OTHER): Payer: Medicare Other | Admitting: Lab

## 2012-10-05 ENCOUNTER — Ambulatory Visit: Payer: Medicare Other | Attending: Gynecology | Admitting: Gynecology

## 2012-10-05 ENCOUNTER — Encounter: Payer: Self-pay | Admitting: Gynecology

## 2012-10-05 VITALS — BP 160/80 | HR 80 | Temp 98.6°F | Resp 20 | Ht 63.5 in | Wt 192.3 lb

## 2012-10-05 DIAGNOSIS — Z8543 Personal history of malignant neoplasm of ovary: Secondary | ICD-10-CM | POA: Insufficient documentation

## 2012-10-05 DIAGNOSIS — I10 Essential (primary) hypertension: Secondary | ICD-10-CM | POA: Insufficient documentation

## 2012-10-05 DIAGNOSIS — C5700 Malignant neoplasm of unspecified fallopian tube: Secondary | ICD-10-CM | POA: Insufficient documentation

## 2012-10-05 DIAGNOSIS — K219 Gastro-esophageal reflux disease without esophagitis: Secondary | ICD-10-CM | POA: Diagnosis not present

## 2012-10-05 NOTE — Patient Instructions (Signed)
Be sure to obtain mammograms as scheduled. Return to see Dr.Livesay in 6 months and return to see [place] in one year.

## 2012-10-05 NOTE — Addendum Note (Signed)
Addended by: Warner Mccreedy D on: 10/05/2012 11:31 AM   Modules accepted: Orders

## 2012-10-05 NOTE — Progress Notes (Signed)
Consult Note: Gyn-Onc   [name] 67 y.o. female  Chief Complaint  Patient presents with  . Fallopian tube ca    Follow up   Interval History Since her last visit the patient has done well. She denies any GI or GU symptoms has no pelvic pain pressure vaginal bleeding or discharge. Her functional status is excellent. It is noted that she has lost 12 pounds this year.  She is scheduled to have mammograms in February 2014. [name] She had a CA 125 on 02/06/12 which is 8.4 units per mL. This is stable from previous values. Since Dr. Nicholas Lose is retiring, she wishes to continue gyn care in our office.  HPI: Stage III fallopian tube carcinoma undergoing initial surgical debulking February 2010. She was optimally debulked with minimal disease on peritoneal surfaces. She received 6 cycles of carboplatin and Taxol chemotherapy completed in June 2010. He CT scan and a PET scan at that time showed no evidence disease. The patient has been followed since that time with normal exams and CA 125 values.    Review of Systems:10 point review of systems is negative as noted above.   Vitals: Blood pressure 160/80, pulse 80, temperature 98.6 F (37 C), temperature source Oral, resp. rate 20, height 5' 3.5" (1.613 m), weight 192 lb 4.8 oz (87.227 kg).  Physical Exam: General : The patient is a healthy woman in no acute distress.  HEENT: normocephalic, extraoccular movements normal; neck is supple without thyromegally  Lynphnodes: Supraclavicular and inguinal nodes not enlarged  Abdomen: Soft, non-tender, no ascites, no organomegally, no masses, no hernias  Pelvic:  EGBUS: Normal female  Vagina: Normal, no lesions  Urethra and Bladder: Normal, non-tender  Cervix: Surgically absent  Uterus: Surgically absent  Bi-manual examination: Non-tender; no adenxal masses or nodularity  Rectal: normal sphincter tone, no masses, no blood  Lower extremities: No edema or varicosities. Normal range of motion     Assessment/Plan: Stage III fallopian tube cancer 2010. Patient's clinically free of disease.  We will obtain a CA 125 today. She returned to see Dr. Darrold Span in 6 months and see [place] in one year.  She will be scheduled to see Warner Mccreedy, NP.  Mammograms as previously scheduled.     Allergies  Allergen Reactions  . Boniva (Ibandronate Sodium)     cramp  . Calcium Channel Blockers     Upset stomach  . Compazine     Daughter reacts to compazine/pt does not want to take  . Lyrica (Pregabalin)     Dizzy     Past Medical History  Diagnosis Date  . HTN (hypertension)   . LBP (low back pain)   . Menopause   . Vitamin B12 deficiency   . IBS (irritable bowel syndrome)   . Vitamin D deficiency   . GERD (gastroesophageal reflux disease)   . Ovarian cancer 09/2008    Dr Janae Sauce  . Glaucoma (increased eye pressure)     Past Surgical History  Procedure Date  . Ovarian cancer debulking 09/2008  . Hemorrhoid surgery 2001    Current Outpatient Prescriptions  Medication Sig Dispense Refill  . aspirin 81 MG EC tablet Take 81 mg by mouth daily.        . bimatoprost (LUMIGAN) 0.03 % ophthalmic solution Place 1 drop into both eyes at bedtime.        . Cholecalciferol (VITAMIN D3 SUPER STRENGTH) 2000 UNITS TABS Take by mouth daily.       . cloNIDine (CATAPRES)  0.1 MG tablet Take 0.1 mg by mouth at bedtime.        . Colesevelam HCl (WELCHOL) 3.75 G PACK Take 1 each by mouth 1 day or 1 dose.  30 each  11  . ibandronate (BONIVA) 150 MG tablet Take 150 mg by mouth every 30 (thirty) days. Take in the morning with a full glass of water, on an empty stomach, and do not take anything else by mouth or lie down for the next 30 min.      [name] NASCOBAL 500 MCG/0.1ML SOLN Place 0.1 mLs (500 mcg total) into the nose once a week.  6.9 mL  3  . RABEprazole (ACIPHEX) 20 MG tablet TAKE 1 TABLET DAILY  90 tablet  2  . timolol (TIMOPTIC-XR) 0.5 % ophthalmic gel-forming       . metFORMIN (GLUCOPHAGE) 500 MG  tablet Take 1 tablet (500 mg total) by mouth 2 (two) times daily with a meal.  30 tablet  11  . traMADol (ULTRAM) 50 MG tablet Take 1-2 tablets (50-100 mg total) by mouth 2 (two) times daily as needed for pain.  100 tablet  3    History   Social History  . Marital Status: Married    Spouse Name: N/A    Number of Children: N/A  . Years of Education: N/A   Occupational History  . Homemaker    Social History Main Topics  . Smoking status: Never Smoker   . Smokeless tobacco: Not on file  . Alcohol Use: No  . Drug Use: No  . Sexually Active: Not on file   Other Topics Concern  . Not on file   Social History Narrative  . No narrative on file    Family History  Problem Relation Age of Onset  . Alzheimer's disease Mother   . Mental illness Mother 70    Alzheimer's  . Cancer Father 30    lung ca  . Lung cancer Father       Brandi Corpus, MD 10/05/2012, 11:23 AM

## 2012-10-06 LAB — CA 125: CA 125: 6.9 U/mL (ref 0.0–30.2)

## 2012-10-09 ENCOUNTER — Telehealth: Payer: Self-pay | Admitting: Gynecologic Oncology

## 2012-10-09 NOTE — Telephone Encounter (Signed)
Informed the patient of CA 125 results of 6.9.  Informed to call Dr. Mina Marble office to arrange for a new patient appointment due to constant urinary leakage.  Pt stating "I may pursue this."  No concerns voiced.  Instructed to call for any needs.

## 2012-11-14 ENCOUNTER — Other Ambulatory Visit: Payer: Self-pay

## 2012-12-19 ENCOUNTER — Ambulatory Visit
Admission: RE | Admit: 2012-12-19 | Discharge: 2012-12-19 | Disposition: A | Discharge status: Home / Self Care | Payer: Medicare Other | Source: Ambulatory Visit

## 2012-12-19 DIAGNOSIS — Z1231 Encounter for screening mammogram for malignant neoplasm of breast: Secondary | ICD-10-CM | POA: Diagnosis not present

## 2013-02-04 ENCOUNTER — Encounter: Payer: Self-pay | Admitting: Oncology

## 2013-02-04 ENCOUNTER — Other Ambulatory Visit (HOSPITAL_BASED_OUTPATIENT_CLINIC_OR_DEPARTMENT_OTHER): Payer: Medicare Other | Admitting: Lab

## 2013-02-04 ENCOUNTER — Telehealth: Payer: Self-pay | Admitting: Oncology

## 2013-02-04 ENCOUNTER — Telehealth: Payer: Self-pay | Admitting: *Deleted

## 2013-02-04 ENCOUNTER — Ambulatory Visit (HOSPITAL_BASED_OUTPATIENT_CLINIC_OR_DEPARTMENT_OTHER): Payer: Medicare Other | Admitting: Oncology

## 2013-02-04 VITALS — BP 150/58 | HR 82 | Temp 97.7°F | Resp 18 | Ht 63.5 in | Wt 197.2 lb

## 2013-02-04 DIAGNOSIS — C5700 Malignant neoplasm of unspecified fallopian tube: Secondary | ICD-10-CM | POA: Diagnosis not present

## 2013-02-04 DIAGNOSIS — D490 Neoplasm of unspecified behavior of digestive system: Secondary | ICD-10-CM

## 2013-02-04 DIAGNOSIS — E119 Type 2 diabetes mellitus without complications: Secondary | ICD-10-CM | POA: Diagnosis not present

## 2013-02-04 DIAGNOSIS — Z8543 Personal history of malignant neoplasm of ovary: Secondary | ICD-10-CM

## 2013-02-04 DIAGNOSIS — C549 Malignant neoplasm of corpus uteri, unspecified: Secondary | ICD-10-CM

## 2013-02-04 DIAGNOSIS — R197 Diarrhea, unspecified: Secondary | ICD-10-CM | POA: Diagnosis not present

## 2013-02-04 LAB — COMPREHENSIVE METABOLIC PANEL (CC13)
ALT: 20 U/L (ref 0–55)
AST: 19 U/L (ref 5–34)
Albumin: 3.6 g/dL (ref 3.5–5.0)
BUN: 12.2 mg/dL (ref 7.0–26.0)
CO2: 25 mEq/L (ref 22–29)
Calcium: 8.8 mg/dL (ref 8.4–10.4)
Chloride: 108 mEq/L — ABNORMAL HIGH (ref 98–107)
Creatinine: 0.8 mg/dL (ref 0.6–1.1)
Potassium: 3.5 mEq/L (ref 3.5–5.1)

## 2013-02-04 LAB — CBC WITH DIFFERENTIAL/PLATELET
BASO%: 0.8 % (ref 0.0–2.0)
Eosinophils Absolute: 0.1 10*3/uL (ref 0.0–0.5)
HCT: 33.3 % — ABNORMAL LOW (ref 34.8–46.6)
MCHC: 34.7 g/dL (ref 31.5–36.0)
MONO#: 0.3 10*3/uL (ref 0.1–0.9)
NEUT#: 3.4 10*3/uL (ref 1.5–6.5)
NEUT%: 66.8 % (ref 38.4–76.8)
WBC: 5 10*3/uL (ref 3.9–10.3)
lymph#: 1.2 10*3/uL (ref 0.9–3.3)

## 2013-02-04 NOTE — Progress Notes (Signed)
OFFICE PROGRESS NOTE   02/04/2013   Physicians: D.ClarkePearson, A.[name], D.Brodie   INTERVAL HISTORY:   Patient is seen, alone for visit, in scheduled follow up of fallopian cancer, on observation since she completed treatment in June 2010.   ONCOLOGIC HISTORY Patient was diagnosed with IIIC serous fallopian carcinoma at [facility] and omentectomy 10-07-2008, this optimal debulking with only residual being small involvement on peritoneal surfaces at completion of the surgery. She had 6 cycles of taxol/ carboplatin from 11-12-2008 thru 03-03-2009. She has been free of disease since that treatment was completed. Last imaging was PET in 04-2009, and last visit with Dr Yolande Jolly was 10-05-12 and she will be seen back by gyn oncology in a year. Preoperative CA 125 was 206 in Jan 2010; CA 125 in Jan 2014 was 6.9.  Patient has been well overall since she was seen last. She has been on monthly Boniva x 6, with stools looser since she has been on this including occasional fecal incontinence; she had tried Boniva previously years ago, then unable to tolerate due to more upper GI symptoms. She had unremarkable colonoscopy by Dr Juanda Chance Aug 2012. She has chronic mild urinary incontinence, has not wanted to see urology. No fever or symptoms of infection. Same occasional pain LLQ, otherwise no abdominal or pelvic pain. No bleeding, no LE swelling, no cough or other respiratory symptoms. Remainder of 10 point Review of Systems negative.  Objective:  Vital signs in last 24 hours:  BP 150/58  Pulse 82  Temp(Src) 97.7 F (36.5 C) (Oral)  Resp 18  Ht 5' 3.5" (1.613 m)  Wt 197 lb 3.2 oz (89.449 kg)  BMI 34.38 kg/m2  SpO2 100%  Weight is up 5 lbs from Jan. Easily ambulatory, looks comfortable, very pleasant as always.  HEENT:PERRLA, sclera clear, anicteric and oropharynx clear, no lesions LymphaticsCervical, supraclavicular, and axillary nodes normal. Resp: clear to auscultation bilaterally and normal  percussion bilaterally Cardio: regular rate and rhythm GI: obese, soft, not distended, not tender, normal bowel sounds, no appreciable HSM or mass  No tenderness LLQ. Extremities: extremities normal, atraumatic, no cyanosis or edema Neuro:no focal deficits Breasts bilaterally without dominant mass, skin or nipple findings Skin with a few seborrheic keratoses and cherry angiomata, nothing of concern.  Lab Results:  Results for orders placed in visit on 02/04/13  CBC WITH DIFFERENTIAL      Result Value Range   WBC 5.0  3.9 - 10.3 10e3/uL   NEUT# 3.4  1.5 - 6.5 10e3/uL   HGB 11.6  11.6 - 15.9 g/dL   HCT 45.4 (*) 09.8 - 11.9 %   Platelets 174  145 - 400 10e3/uL   MCV 85.1  79.5 - 101.0 fL   MCH 29.5  25.1 - 34.0 pg   MCHC 34.7  31.5 - 36.0 g/dL   RBC 1.47  8.29 - 5.62 10e6/uL   RDW 13.6  11.2 - 14.5 %   lymph# 1.2  0.9 - 3.3 10e3/uL   MONO# 0.3  0.1 - 0.9 10e3/uL   Eosinophils Absolute 0.1  0.0 - 0.5 10e3/uL   Basophils Absolute 0.0  0.0 - 0.1 10e3/uL   NEUT% 66.8  38.4 - 76.8 %   LYMPH% 24.3  14.0 - 49.7 %   MONO% 6.1  0.0 - 14.0 %   EOS% 2.0  0.0 - 7.0 %   BASO% 0.8  0.0 - 2.0 %  COMPREHENSIVE METABOLIC PANEL (CC13)      Result Value Range   Sodium 141  136 - 145 mEq/L   Potassium 3.5  3.5 - 5.1 mEq/L   Chloride 108 (*) 98 - 107 mEq/L   CO2 25  22 - 29 mEq/L   Glucose 166 (*) 70 - 99 mg/dl   BUN 16.1  7.0 - 09.6 mg/dL   Creatinine 0.8  0.6 - 1.1 mg/dL   Total Bilirubin 0.45  0.20 - 1.20 mg/dL   Alkaline Phosphatase 72  40 - 150 U/L   AST 19  5 - 34 U/L   ALT 20  0 - 55 U/L   Total Protein 7.0  6.4 - 8.3 g/dL   Albumin 3.6  3.5 - 5.0 g/dL   Calcium 8.8  8.4 - 40.9 mg/dL    [id number] available after visit 6.2 Studies/Results: DIGITAL BILATERAL SCREENING MAMMOGRAM WITH CAD  Comparison: Previous exams.  FINDINGS:  ACR Breast Density Category 2: There is a scattered fibroglandular  pattern.  No suspicious masses, architectural distortion, or calcifications  are present.   Images were processed with CAD.  IMPRESSION:  No mammographic evidence of malignancy.  A result letter of this screening mammogram will be mailed directly  to the patient.  RECOMMENDATION:  Screening mammogram in one year. (Code:[id number]-B-01Y)  BI-RADS CATEGORY 1: Negative.   Medications: I have reviewed the patient's current medications.  Assessment/Plan:  1.History of stage III fallopian carcinoma, with history as above, now out over 4 years from diagnosis and clinically doing well. She will see gyn oncology in 6 months with repeat CA 125 shortly prior to that visit, then medical oncology in a year from now with CBC,CMET, CA 125 2.obesity: still no regular exercise, discussed. 3.type 2 diabetes  4. HTN, GERD, IBS, glaucoma .5.loose stools possibly from [place]. She will discuss with Dr Posey Rea. 6.Family history of dystonic reactions to compazine  Patient very appreciative of care and is thankful that she continues to do well.  Ketty Bitton P, MD   02/04/2013, 10:36 AM

## 2013-02-04 NOTE — Telephone Encounter (Signed)
Pt notified of CA 125, CBC  and CMET resutls

## 2013-02-04 NOTE — Patient Instructions (Signed)
Discuss Boniva with Dr Posey Rea

## 2013-02-04 NOTE — Telephone Encounter (Signed)
gv and printed appt sched and avs to pt...per Selena Batten the pt needs to call in OCT to get a Jan appt with Dr. Yolande Jolly

## 2013-02-05 ENCOUNTER — Encounter: Payer: Self-pay | Admitting: Internal Medicine

## 2013-02-05 ENCOUNTER — Ambulatory Visit (INDEPENDENT_AMBULATORY_CARE_PROVIDER_SITE_OTHER): Payer: Medicare Other | Admitting: Internal Medicine

## 2013-02-05 VITALS — BP 142/90 | HR 68 | Temp 98.2°F | Resp 16 | Wt 195.0 lb

## 2013-02-05 DIAGNOSIS — E538 Deficiency of other specified B group vitamins: Secondary | ICD-10-CM

## 2013-02-05 DIAGNOSIS — M545 Low back pain: Secondary | ICD-10-CM | POA: Diagnosis not present

## 2013-02-05 DIAGNOSIS — E785 Hyperlipidemia, unspecified: Secondary | ICD-10-CM

## 2013-02-05 DIAGNOSIS — R739 Hyperglycemia, unspecified: Secondary | ICD-10-CM | POA: Insufficient documentation

## 2013-02-05 DIAGNOSIS — E559 Vitamin D deficiency, unspecified: Secondary | ICD-10-CM | POA: Diagnosis not present

## 2013-02-05 DIAGNOSIS — M810 Age-related osteoporosis without current pathological fracture: Secondary | ICD-10-CM | POA: Diagnosis not present

## 2013-02-05 DIAGNOSIS — I10 Essential (primary) hypertension: Secondary | ICD-10-CM

## 2013-02-05 DIAGNOSIS — Z23 Encounter for immunization: Secondary | ICD-10-CM

## 2013-02-05 DIAGNOSIS — R7309 Other abnormal glucose: Secondary | ICD-10-CM

## 2013-02-05 MED ORDER — CLONIDINE HCL 0.1 MG PO TABS: 0.1000 mg | ORAL_TABLET | Freq: Two times a day (BID) | ORAL | Status: DC

## 2013-02-05 MED ORDER — COLESEVELAM HCL 3.75 G PO PACK: 1.0000 | PACK | ORAL | Status: DC

## 2013-02-05 NOTE — Assessment & Plan Note (Signed)
Will start Prolia

## 2013-02-05 NOTE — Assessment & Plan Note (Signed)
Continue with current prescription therapy as reflected on the Med list.  

## 2013-02-05 NOTE — Progress Notes (Signed)
   Subjective:     HPI  F/u neuropathy, elev glu, elev BP, anxiety, osteoporhosis. She did not take Cymbalta, Glucophage. Doing well on diet... Boniva caused upset stomach  Wt Readings from Last 3 Encounters:  02/05/13 195 lb (88.451 kg)  02/04/13 197 lb 3.2 oz (89.449 kg)  10/05/12 192 lb 4.8 oz (87.227 kg)   BP Readings from Last 3 Encounters:  02/05/13 142/90  02/04/13 150/58  10/05/12 160/80      Review of Systems  Constitutional: Negative for chills, activity change, appetite change, fatigue and unexpected weight change.  HENT: Negative for congestion, mouth sores and sinus pressure.   Eyes: Negative for visual disturbance.  Respiratory: Negative for cough and chest tightness.   Gastrointestinal: Negative for nausea and abdominal pain.  Genitourinary: Negative for frequency, difficulty urinating and vaginal pain.  Musculoskeletal: Negative for back pain and gait problem.  Skin: Negative for pallor and rash.  Neurological: Positive for numbness. Negative for dizziness, tremors, weakness and headaches.  Psychiatric/Behavioral: Negative for suicidal ideas, confusion, sleep disturbance, dysphoric mood and agitation.   Wt Readings from Last 3 Encounters:  02/05/13 195 lb (88.451 kg)  02/04/13 197 lb 3.2 oz (89.449 kg)  10/05/12 192 lb 4.8 oz (87.227 kg)   BP Readings from Last 3 Encounters:  02/05/13 142/90  02/04/13 150/58  10/05/12 160/80       Objective:   Physical Exam  Constitutional: She appears well-developed. No distress.  obese  HENT:  Head: Normocephalic.  Right Ear: External ear normal.  Left Ear: External ear normal.  Nose: Nose normal.  Mouth/Throat: Oropharynx is clear and moist.  Eyes: Conjunctivae are normal. Pupils are equal, round, and reactive to light. Right eye exhibits no discharge. Left eye exhibits no discharge.  Neck: Normal range of motion. Neck supple. No JVD present. No tracheal deviation present. No thyromegaly present.   Cardiovascular: Normal rate, regular rhythm and normal heart sounds.   Pulmonary/Chest: No stridor. No respiratory distress. She has no wheezes.  Abdominal: Soft. Bowel sounds are normal. She exhibits no distension and no mass. There is no tenderness. There is no rebound and no guarding.  Musculoskeletal: She exhibits no edema and no tenderness.  Lymphadenopathy:    She has no cervical adenopathy.  Neurological: She displays normal reflexes. No cranial nerve deficit. She exhibits normal muscle tone. Coordination normal.  Skin: No rash noted. No erythema.  Psychiatric: She has a normal mood and affect. Her behavior is normal. Judgment and thought content normal.    Lab Results  Component Value Date   WBC 5.0 02/04/2013   HGB 11.6 02/04/2013   HCT 33.3* 02/04/2013   PLT 174 02/04/2013   GLUCOSE 166* 02/04/2013   CHOL 184 08/07/2012   TRIG 112.0 08/07/2012   HDL 41.70 08/07/2012   LDLCALC 120* 08/07/2012   ALT 20 02/04/2013   AST 19 02/04/2013   NA 141 02/04/2013   K 3.5 02/04/2013   CL 108* 02/04/2013   CREATININE 0.8 02/04/2013   BUN 12.2 02/04/2013   CO2 25 02/04/2013   TSH 1.26 08/07/2012   HGBA1C 5.9 08/07/2012   CA125 - stable     Assessment & Plan:

## 2013-02-05 NOTE — Assessment & Plan Note (Signed)
Start Welchol

## 2013-02-05 NOTE — Assessment & Plan Note (Signed)
Welchol 

## 2013-02-05 NOTE — Assessment & Plan Note (Signed)
On Catapress (per Dr Lomax) - increase to bid 

## 2013-02-07 DIAGNOSIS — H01009 Unspecified blepharitis unspecified eye, unspecified eyelid: Secondary | ICD-10-CM | POA: Diagnosis not present

## 2013-02-07 DIAGNOSIS — H40059 Ocular hypertension, unspecified eye: Secondary | ICD-10-CM | POA: Diagnosis not present

## 2013-02-27 ENCOUNTER — Ambulatory Visit (INDEPENDENT_AMBULATORY_CARE_PROVIDER_SITE_OTHER): Payer: Medicare Other | Admitting: *Deleted

## 2013-02-27 ENCOUNTER — Telehealth: Payer: Self-pay

## 2013-02-27 ENCOUNTER — Other Ambulatory Visit: Payer: Self-pay | Admitting: *Deleted

## 2013-02-27 DIAGNOSIS — M810 Age-related osteoporosis without current pathological fracture: Secondary | ICD-10-CM

## 2013-02-27 MED ORDER — DENOSUMAB 60 MG/ML ~~LOC~~ SOLN
60.0000 mg | Freq: Once | SUBCUTANEOUS | Status: AC
  Administered 2013-02-27: 60 mg via SUBCUTANEOUS

## 2013-02-27 MED ORDER — COLESEVELAM HCL 3.75 G PO PACK: 1.0000 | PACK | ORAL | Status: DC

## 2013-02-27 NOTE — Telephone Encounter (Signed)
Received [facility] from [facility] advised that [facility] will require a PA, must call [phone]. Called company and spoke with Verlon Au who advised PA for quantity is needed, clinical info provided and approved through 08/26/13.

## 2013-02-27 NOTE — Telephone Encounter (Signed)
Pt needs rx for Welchol sent to [facility] for 90 day supply. Previous rx written was for 30 day supply and they will not fill it. Rx sent, pt informed.

## 2013-04-10 ENCOUNTER — Other Ambulatory Visit: Payer: Self-pay

## 2013-06-10 ENCOUNTER — Other Ambulatory Visit: Payer: Self-pay | Admitting: Internal Medicine

## 2013-07-11 ENCOUNTER — Other Ambulatory Visit: Payer: Self-pay

## 2013-08-07 ENCOUNTER — Other Ambulatory Visit (INDEPENDENT_AMBULATORY_CARE_PROVIDER_SITE_OTHER): Payer: Medicare Other

## 2013-08-07 ENCOUNTER — Ambulatory Visit (INDEPENDENT_AMBULATORY_CARE_PROVIDER_SITE_OTHER): Payer: Medicare Other | Admitting: Internal Medicine

## 2013-08-07 ENCOUNTER — Encounter: Payer: Self-pay | Admitting: Internal Medicine

## 2013-08-07 VITALS — BP 130/82 | HR 68 | Temp 98.3°F | Resp 16 | Wt 193.0 lb

## 2013-08-07 DIAGNOSIS — E538 Deficiency of other specified B group vitamins: Secondary | ICD-10-CM

## 2013-08-07 DIAGNOSIS — E785 Hyperlipidemia, unspecified: Secondary | ICD-10-CM

## 2013-08-07 DIAGNOSIS — M810 Age-related osteoporosis without current pathological fracture: Secondary | ICD-10-CM | POA: Diagnosis not present

## 2013-08-07 DIAGNOSIS — M545 Low back pain, unspecified: Secondary | ICD-10-CM

## 2013-08-07 DIAGNOSIS — E559 Vitamin D deficiency, unspecified: Secondary | ICD-10-CM | POA: Diagnosis not present

## 2013-08-07 DIAGNOSIS — R7309 Other abnormal glucose: Secondary | ICD-10-CM

## 2013-08-07 DIAGNOSIS — R739 Hyperglycemia, unspecified: Secondary | ICD-10-CM

## 2013-08-07 DIAGNOSIS — I10 Essential (primary) hypertension: Secondary | ICD-10-CM

## 2013-08-07 DIAGNOSIS — Z23 Encounter for immunization: Secondary | ICD-10-CM

## 2013-08-07 LAB — HEPATIC FUNCTION PANEL
ALT: 26 U/L (ref 0–35)
Alkaline Phosphatase: 64 U/L (ref 39–117)
Bilirubin, Direct: 0.1 mg/dL (ref 0.0–0.3)
Total Protein: 7.7 g/dL (ref 6.0–8.3)

## 2013-08-07 LAB — URINALYSIS
Ketones, ur: 15
Urine Glucose: NEGATIVE
Urobilinogen, UA: 0.2 (ref 0.0–1.0)

## 2013-08-07 LAB — CBC WITH DIFFERENTIAL/PLATELET
Basophils Absolute: 0 10*3/uL (ref 0.0–0.1)
Eosinophils Absolute: 0.1 10*3/uL (ref 0.0–0.7)
HCT: 35.2 % — ABNORMAL LOW (ref 36.0–46.0)
Hemoglobin: 12.1 g/dL (ref 12.0–15.0)
Lymphocytes Relative: 23.7 % (ref 12.0–46.0)
Lymphs Abs: 1.6 10*3/uL (ref 0.7–4.0)
MCHC: 34.5 g/dL (ref 30.0–36.0)
Monocytes Absolute: 0.4 10*3/uL (ref 0.1–1.0)
Neutro Abs: 4.5 10*3/uL (ref 1.4–7.7)
Platelets: 214 10*3/uL (ref 150.0–400.0)
RBC: 4.13 Mil/uL (ref 3.87–5.11)
RDW: 13.5 % (ref 11.5–14.6)

## 2013-08-07 LAB — TSH: TSH: 1.46 u[IU]/mL (ref 0.35–5.50)

## 2013-08-07 LAB — HEMOGLOBIN A1C: Hgb A1c MFr Bld: 6 % (ref 4.6–6.5)

## 2013-08-07 LAB — LIPID PANEL: Cholesterol: 166 mg/dL (ref 0–200)

## 2013-08-07 LAB — BASIC METABOLIC PANEL
Calcium: 9.6 mg/dL (ref 8.4–10.5)
Chloride: 105 mEq/L (ref 96–112)
Creatinine, Ser: 0.7 mg/dL (ref 0.4–1.2)
Sodium: 139 mEq/L (ref 135–145)

## 2013-08-07 MED ORDER — COLESEVELAM HCL 3.75 G PO PACK: 1.0000 | PACK | ORAL | Status: DC

## 2013-08-07 MED ORDER — NASCOBAL 500 MCG/0.1ML NA SOLN: 500.0000 ug | NASAL | Status: DC

## 2013-08-07 MED ORDER — CLONIDINE HCL 0.1 MG PO TABS: 0.1000 mg | ORAL_TABLET | Freq: Two times a day (BID) | ORAL | Status: DC

## 2013-08-07 NOTE — Progress Notes (Signed)
   Subjective:     HPI  F/u neuropathy, elev glu, elev BP, anxiety, osteoporhosis. She did not take Cymbalta, Glucophage. Doing well on diet... Boniva caused upset stomach  Wt Readings from Last 3 Encounters:  08/07/13 193 lb (87.544 kg)  02/05/13 195 lb (88.451 kg)  02/04/13 197 lb 3.2 oz (89.449 kg)   BP Readings from Last 3 Encounters:  08/07/13 130/82  02/05/13 142/90  02/04/13 150/58      Review of Systems  Constitutional: Negative for chills, activity change, appetite change, fatigue and unexpected weight change.  HENT: Negative for congestion, mouth sores and sinus pressure.   Eyes: Negative for visual disturbance.  Respiratory: Negative for cough and chest tightness.   Gastrointestinal: Negative for nausea and abdominal pain.  Genitourinary: Negative for frequency, difficulty urinating and vaginal pain.  Musculoskeletal: Negative for back pain and gait problem.  Skin: Negative for pallor and rash.  Neurological: Positive for numbness. Negative for dizziness, tremors, weakness and headaches.  Psychiatric/Behavioral: Negative for suicidal ideas, confusion, sleep disturbance, dysphoric mood and agitation.         Objective:   Physical Exam  Constitutional: She appears well-developed. No distress.  obese  HENT:  Head: Normocephalic.  Right Ear: External ear normal.  Left Ear: External ear normal.  Nose: Nose normal.  Mouth/Throat: Oropharynx is clear and moist.  Eyes: Conjunctivae are normal. Pupils are equal, round, and reactive to light. Right eye exhibits no discharge. Left eye exhibits no discharge.  Neck: Normal range of motion. Neck supple. No JVD present. No tracheal deviation present. No thyromegaly present.  Cardiovascular: Normal rate, regular rhythm and normal heart sounds.   Pulmonary/Chest: No stridor. No respiratory distress. She has no wheezes.  Abdominal: Soft. Bowel sounds are normal. She exhibits no distension and no mass. There is no  tenderness. There is no rebound and no guarding.  Musculoskeletal: She exhibits no edema and no tenderness.  Lymphadenopathy:    She has no cervical adenopathy.  Neurological: She displays normal reflexes. No cranial nerve deficit. She exhibits normal muscle tone. Coordination normal.  Skin: No rash noted. No erythema.  Psychiatric: She has a normal mood and affect. Her behavior is normal. Judgment and thought content normal.    Lab Results  Component Value Date   WBC 5.0 02/04/2013   HGB 11.6 02/04/2013   HCT 33.3* 02/04/2013   PLT 174 02/04/2013   GLUCOSE 166* 02/04/2013   CHOL 184 08/07/2012   TRIG 112.0 08/07/2012   HDL 41.70 08/07/2012   LDLCALC 120* 08/07/2012   ALT 20 02/04/2013   AST 19 02/04/2013   NA 141 02/04/2013   K 3.5 02/04/2013   CL 108* 02/04/2013   CREATININE 0.8 02/04/2013   BUN 12.2 02/04/2013   CO2 25 02/04/2013   TSH 1.26 08/07/2012   HGBA1C 5.9 08/07/2012   CA125 - stable     Assessment & Plan:

## 2013-08-07 NOTE — Progress Notes (Signed)
Pre visit review using our clinic review tool, if applicable. No additional management support is needed unless otherwise documented below in the visit note. 

## 2013-08-08 DIAGNOSIS — H01009 Unspecified blepharitis unspecified eye, unspecified eyelid: Secondary | ICD-10-CM | POA: Diagnosis not present

## 2013-08-08 DIAGNOSIS — H40059 Ocular hypertension, unspecified eye: Secondary | ICD-10-CM | POA: Diagnosis not present

## 2013-08-08 DIAGNOSIS — H2510 Age-related nuclear cataract, unspecified eye: Secondary | ICD-10-CM | POA: Diagnosis not present

## 2013-08-08 DIAGNOSIS — H04129 Dry eye syndrome of unspecified lacrimal gland: Secondary | ICD-10-CM | POA: Diagnosis not present

## 2013-08-12 NOTE — Assessment & Plan Note (Signed)
Better Continue with current prescription therapy as reflected on the Med list.  

## 2013-08-12 NOTE — Assessment & Plan Note (Signed)
Continue with current prescription therapy as reflected on the Med list.  

## 2013-08-12 NOTE — Assessment & Plan Note (Signed)
Labs today

## 2013-09-02 ENCOUNTER — Telehealth: Payer: Self-pay | Admitting: *Deleted

## 2013-09-02 ENCOUNTER — Ambulatory Visit (INDEPENDENT_AMBULATORY_CARE_PROVIDER_SITE_OTHER): Payer: Medicare Other | Admitting: *Deleted

## 2013-09-02 DIAGNOSIS — M810 Age-related osteoporosis without current pathological fracture: Secondary | ICD-10-CM | POA: Diagnosis not present

## 2013-09-02 MED ORDER — DENOSUMAB 60 MG/ML ~~LOC~~ SOLN
60.0000 mg | Freq: Once | SUBCUTANEOUS | Status: AC
  Administered 2013-09-02: 60 mg via SUBCUTANEOUS

## 2013-09-02 NOTE — Telephone Encounter (Signed)
pts husband called states Welchol requires PA.

## 2013-09-06 ENCOUNTER — Telehealth: Payer: Self-pay | Admitting: *Deleted

## 2013-09-06 ENCOUNTER — Other Ambulatory Visit (HOSPITAL_BASED_OUTPATIENT_CLINIC_OR_DEPARTMENT_OTHER): Payer: Medicare Other

## 2013-09-06 DIAGNOSIS — D490 Neoplasm of unspecified behavior of digestive system: Secondary | ICD-10-CM | POA: Diagnosis not present

## 2013-09-06 DIAGNOSIS — E119 Type 2 diabetes mellitus without complications: Secondary | ICD-10-CM | POA: Diagnosis not present

## 2013-09-06 DIAGNOSIS — C5700 Malignant neoplasm of unspecified fallopian tube: Secondary | ICD-10-CM | POA: Diagnosis not present

## 2013-09-06 LAB — CA 125: CA 125: 7.4 U/mL (ref 0.0–30.2)

## 2013-09-06 NOTE — Telephone Encounter (Signed)
Miranda called states Welchol needs PA however has an alternative preferred drug list which is:  Choles (prevalite) LT powder, Cholestyramine powder can LT powder, and Colestitol packets, tablets and granules.  Please advise

## 2013-09-08 NOTE — Telephone Encounter (Signed)
Please PA. Thx

## 2013-09-11 ENCOUNTER — Ambulatory Visit: Payer: Medicare Other | Admitting: Internal Medicine

## 2013-09-13 ENCOUNTER — Encounter: Payer: Self-pay | Admitting: Gynecology

## 2013-09-13 ENCOUNTER — Telehealth: Payer: Self-pay | Admitting: Oncology

## 2013-09-13 ENCOUNTER — Ambulatory Visit: Payer: Medicare Other | Attending: Gynecology | Admitting: Gynecology

## 2013-09-13 VITALS — BP 150/80 | HR 98 | Temp 97.5°F | Resp 16 | Ht 63.5 in | Wt 195.8 lb

## 2013-09-13 DIAGNOSIS — Z9221 Personal history of antineoplastic chemotherapy: Secondary | ICD-10-CM | POA: Diagnosis not present

## 2013-09-13 DIAGNOSIS — I10 Essential (primary) hypertension: Secondary | ICD-10-CM | POA: Diagnosis not present

## 2013-09-13 DIAGNOSIS — K219 Gastro-esophageal reflux disease without esophagitis: Secondary | ICD-10-CM | POA: Insufficient documentation

## 2013-09-13 DIAGNOSIS — Z8544 Personal history of malignant neoplasm of other female genital organs: Secondary | ICD-10-CM | POA: Insufficient documentation

## 2013-09-13 DIAGNOSIS — Z09 Encounter for follow-up examination after completed treatment for conditions other than malignant neoplasm: Secondary | ICD-10-CM | POA: Diagnosis not present

## 2013-09-13 DIAGNOSIS — Z79899 Other long term (current) drug therapy: Secondary | ICD-10-CM | POA: Insufficient documentation

## 2013-09-13 DIAGNOSIS — H409 Unspecified glaucoma: Secondary | ICD-10-CM | POA: Insufficient documentation

## 2013-09-13 DIAGNOSIS — C5700 Malignant neoplasm of unspecified fallopian tube: Secondary | ICD-10-CM

## 2013-09-13 NOTE — Patient Instructions (Signed)
Doing well. Follow up in 1 yr, lab CA 125 at that time. Follow up with Dr. Marko Plume in 71months.

## 2013-09-13 NOTE — Progress Notes (Signed)
Consult Note: Gyn-Onc   [name] 68 y.o. female  Chief Complaint  Patient presents with  . Fallopian tube cancer    Follow up   Assessment: Stage III fallopian tube cancer 2010. Clinically free of disease.  Plan the patient will see Dr. Marko Plume in 6 months and return to see [place] in one year. We'll continue to monitor CA 125 values.  Interval History Since her last visit the patient has done well. She denies any GI or GU symptoms has no pelvic pain pressure vaginal bleeding or discharge. Her functional status excellent. Her CA 125 on 09/06/2013 with 7.4 units per mL.  HPI: Stage III fallopian tube carcinoma undergoing initial surgical debulking February 2010. She was optimally debulked with minimal disease on peritoneal surfaces. She received 6 cycles of carboplatin and Taxol chemotherapy completed in June 2010. He CT scan and a PET scan at that time showed no evidence disease. The patient has been followed since that time with normal exams and CA 125 values.    Review of Systems:10 point review of systems is negative as noted above.   Vitals: Blood pressure 150/80, pulse 98, temperature 97.5 F (36.4 C), temperature source Oral, resp. rate 16, height 5' 3.5" (1.613 m), weight 195 lb 12.8 oz (88.814 kg).  Physical Exam: General : The patient is a healthy woman in no acute distress.  HEENT: normocephalic, extraoccular movements normal; neck is supple without thyromegally  Lynphnodes: Supraclavicular, axillary and inguinal nodes not enlarged   Breasts are without masses skin changes or nipple discharge. Abdomen: Soft, non-tender, no ascites, no organomegally, no masses, no hernias  Pelvic:  EGBUS: Normal female  Vagina: Normal, no lesions  Urethra and Bladder: Normal, non-tender  Cervix: Surgically absent  Uterus: Surgically absent  Bi-manual examination: Non-tender; no adenxal masses or nodularity  Rectal: normal sphincter tone, no masses, no blood  Lower extremities: No  edema or varicosities. Normal range of motion    Assessment/Plan: Stage III fallopian tube cancer 2010. Patient's clinically free of disease.    Allergies  Allergen Reactions  . Actonel [Risedronate Sodium]     Upset stomach  . Boniva [Ibandronate Sodium]     cramp  . Calcium Channel Blockers     Upset stomach  . Compazine     Daughter reacts to compazine/pt does not want to take  . Lyrica [Pregabalin]     Dizzy     Past Medical History  Diagnosis Date  . HTN (hypertension)   . LBP (low back pain)   . Menopause   . Vitamin B12 deficiency   . IBS (irritable bowel syndrome)   . Vitamin D deficiency   . GERD (gastroesophageal reflux disease)   . Ovarian cancer 09/2008    Dr Gwyneth Revels  . Glaucoma (increased eye pressure)     Past Surgical History  Procedure Laterality Date  . Ovarian cancer debulking  09/2008  . Hemorrhoid surgery  2001    Current Outpatient Prescriptions  Medication Sig Dispense Refill  . aspirin 81 MG EC tablet Take 81 mg by mouth daily.        . bimatoprost (LUMIGAN) 0.03 % ophthalmic solution Place 1 drop into both eyes at bedtime.        . Cholecalciferol (VITAMIN D3 SUPER STRENGTH) 2000 UNITS TABS Take by mouth daily.       . cloNIDine (CATAPRES) 0.1 MG tablet Take 1 tablet (0.1 mg total) by mouth 2 (two) times daily.  60 tablet  11  .  Colesevelam HCl (WELCHOL) 3.75 G PACK Take 1 packet by mouth 1 day or 1 dose.  90 each  3  . denosumab (PROLIA) 60 MG/ML SOLN injection Inject 60 mg into the skin every 6 (six) months. Administer in upper arm, thigh, or abdomen      . NASCOBAL 500 MCG/0.1ML SOLN Place 0.1 mLs (500 mcg total) into the nose once a week.  6.9 mL  3  . RABEprazole (ACIPHEX) 20 MG tablet TAKE 1 TABLET DAILY  90 tablet  1  . timolol (TIMOPTIC-XR) 0.5 % ophthalmic gel-forming       . ibandronate (BONIVA) 150 MG tablet Take 150 mg by mouth every 30 (thirty) days. Take in the morning with a full glass of water, on an empty stomach, and do not  take anything else by mouth or lie down for the next 30 min.      . metFORMIN (GLUCOPHAGE) 500 MG tablet Take 1 tablet (500 mg total) by mouth 2 (two) times daily with a meal.  30 tablet  11  . traMADol (ULTRAM) 50 MG tablet Take 1-2 tablets (50-100 mg total) by mouth 2 (two) times daily as needed for pain.  100 tablet  3   No current facility-administered medications for this visit.    History   Social History  . Marital Status: Married    Spouse Name: N/A    Number of Children: N/A  . Years of Education: N/A   Occupational History  . Homemaker    Social History Main Topics  . Smoking status: Never Smoker   . Smokeless tobacco: Not on file  . Alcohol Use: No  . Drug Use: No  . Sexual Activity: Not on file   Other Topics Concern  . Not on file   Social History Narrative  . No narrative on file    Family History  Problem Relation Age of Onset  . Alzheimer's disease Mother   . Mental illness Mother 81    Alzheimer's  . Cancer Father 57    lung ca  . Lung cancer Father       Alvino Chapel, MD 09/13/2013, 11:00 AM

## 2013-09-16 ENCOUNTER — Telehealth: Payer: Self-pay | Admitting: *Deleted

## 2013-09-16 NOTE — Telephone Encounter (Signed)
Spoke to pharmacist and advised pt has tried/failed Cholestyramine pwd pkt 9 gm. Therefore, Welchol PA is approved until 09/16/14. Case ID [id number]. Pt will be notifed by them directly and Welchol will be shipped to pt. Pt informed

## 2013-09-16 NOTE — Telephone Encounter (Signed)
[name] with [facility] has phoned requesting IMMEDIATE  Resolution to PA for pt's welchol order, seeing as in previous message correspondences, MD has requested PA be completed for the welchol, rather than order it's alternatives.  Transfering to [name] per pharmacy request.  CB# [phone] 9-5:30 pm EST When calling, please use reference # [id number]

## 2013-09-16 NOTE — Telephone Encounter (Signed)
I think I filled it out already a while ago Thx

## 2013-09-23 ENCOUNTER — Encounter: Payer: Self-pay | Admitting: Family Medicine

## 2013-09-23 ENCOUNTER — Ambulatory Visit (INDEPENDENT_AMBULATORY_CARE_PROVIDER_SITE_OTHER): Payer: Medicare Other | Admitting: Family Medicine

## 2013-09-23 ENCOUNTER — Ambulatory Visit (INDEPENDENT_AMBULATORY_CARE_PROVIDER_SITE_OTHER)
Admission: RE | Admit: 2013-09-23 | Discharge: 2013-09-23 | Disposition: A | Discharge status: Home / Self Care | Payer: Medicare Other | Source: Ambulatory Visit | Attending: Family Medicine | Admitting: Family Medicine

## 2013-09-23 VITALS — BP 138/72 | HR 105 | Temp 97.6°F | Resp 16 | Wt 195.0 lb

## 2013-09-23 DIAGNOSIS — M47817 Spondylosis without myelopathy or radiculopathy, lumbosacral region: Secondary | ICD-10-CM | POA: Diagnosis not present

## 2013-09-23 DIAGNOSIS — M549 Dorsalgia, unspecified: Secondary | ICD-10-CM

## 2013-09-23 DIAGNOSIS — G5700 Lesion of sciatic nerve, unspecified lower limb: Secondary | ICD-10-CM

## 2013-09-23 DIAGNOSIS — M2170 Unequal limb length (acquired), unspecified site: Secondary | ICD-10-CM | POA: Insufficient documentation

## 2013-09-23 DIAGNOSIS — G5702 Lesion of sciatic nerve, left lower limb: Secondary | ICD-10-CM | POA: Insufficient documentation

## 2013-09-23 MED ORDER — MELOXICAM 15 MG PO TABS: 15.0000 mg | ORAL_TABLET | Freq: Every day | ORAL | Status: DC

## 2013-09-23 NOTE — Progress Notes (Signed)
  CC: Left side pain  HPI: Patient is a 68 year old female coming in with one week history of left buttocks pain. Patient states that this has started out of the blue and does not remember any injury. Patient denies any radiation of the leg any numbness or weakness. Patient states that socially she has trouble with standing long amount of time secondary to this pain as well as with ambulation seems to worse. Patient cannot sit for long amount of time secondary to this as well. Patient denies any bowel or bladder changes. Patient does have a history of ovarian cancer a recent workup shows that it is in remission the patient is still extremely anxious. Patient rates the pain is 8/10 in severity. Patient finds it difficult to find a comfortable position.   Past medical, surgical, family and social history reviewed. Medications reviewed all in the electronic medical record.   Review of Systems: No headache, visual changes, nausea, vomiting, diarrhea, constipation, dizziness, abdominal pain, skin rash, fevers, chills, night sweats, weight loss, swollen lymph nodes, body aches, joint swelling, muscle aches, chest pain, shortness of breath, mood changes.   Objective:    Blood pressure 138/72, pulse 105, temperature 97.6 F (36.4 C), temperature source Oral, resp. rate 16, weight 195 lb (88.451 kg), SpO2 95.00%.   General: No apparent distress alert and oriented x3 mood and affect normal, dressed appropriately.  HEENT: Pupils equal, extraocular movements intact Respiratory: Patient's speak in full sentences and does not appear short of breath Cardiovascular: No lower extremity edema, non tender, no erythema Skin: Warm dry intact with no signs of infection or rash on extremities or on axial skeleton. Abdomen: Soft nontender Neuro: Cranial nerves II through XII are intact, neurovascularly intact in all extremities with 2+ DTRs and 2+ pulses. Lymph: No lymphadenopathy of posterior or anterior cervical  chain or axillae bilaterally.  Gait normal with good balance and coordination.  Patient's right leg seems to be a quarter of an inch shorter. MSK: Non tender with full range of motion and good stability and symmetric strength and tone of shoulders, elbows, wrist, knee and ankles bilaterally.  Hip: Left ROM IR: 35 Deg, ER: 45 Deg, Flexion: 120 Deg, Extension: 100 Deg, Abduction: 45 Deg, Adduction: 45 Deg Strength IR: 5/5, ER: 5/5, Flexion: 5/5, Extension: 5/5, Abduction: 5/5, Adduction: 5/5 Pelvic alignment unremarkable to inspection and palpation. Standing hip rotation and gait without trendelenburg sign / unsteadiness. Greater trochanter without tenderness to palpation.  tenderness over piriformis A positive Corky Sox but negativer FADIR. No SI joint tenderness and normal minimal SI movement. Contralateral hip unremarkable Impression and Recommendations:     This case required medical decision making of moderate complexity.

## 2013-09-23 NOTE — Assessment & Plan Note (Signed)
Piriformis Syndrome  Using an anatomical model, reviewed with the patient the structures involved and how they related to diagnosis. The patient indicated understanding.   The patient was given a handout from Dr. Arne Cleveland book "The Sports Medicine Patient Advisor" describing the anatomy and rehabilitation of the following condition: Piriformis Syndrome  Also given a handout with more extensive Piriformis stretching, hip flexor and abductor strengthening, ham stretching  Rec deep massage, explained self-massage with ball tennis ball in back pocket  Discussed meloxicam to take daily for 10 days then as needed.  Heel lift given for the right shoe.  Return in 2-4 weeks.

## 2013-09-23 NOTE — Patient Instructions (Signed)
Very nice to meet you Get xrays downstairs today, no news is good news.  Ice to area 20 minutes 2 times a day.  Tennis ball in back left pocket.  meloxicam daily for 10 days then as needed.  Exercises most day sof the week Wear heel lift in right shoe Come back in 2 weeks.

## 2013-09-23 NOTE — Progress Notes (Signed)
Pre-visit discussion using our clinic review tool. No additional management support is needed unless otherwise documented below in the visit note.  

## 2013-09-26 ENCOUNTER — Telehealth: Payer: Self-pay | Admitting: *Deleted

## 2013-09-26 NOTE — Telephone Encounter (Signed)
Welchol PA is approved 08/17/13 until 09/16/2014. Pt was informed by plan.

## 2013-10-07 ENCOUNTER — Encounter: Payer: Self-pay | Admitting: Family Medicine

## 2013-10-07 ENCOUNTER — Ambulatory Visit (INDEPENDENT_AMBULATORY_CARE_PROVIDER_SITE_OTHER): Payer: Medicare Other | Admitting: Family Medicine

## 2013-10-07 VITALS — BP 140/80 | HR 87 | Temp 98.4°F | Resp 16 | Wt 198.0 lb

## 2013-10-07 DIAGNOSIS — M25559 Pain in unspecified hip: Secondary | ICD-10-CM

## 2013-10-07 DIAGNOSIS — R1030 Lower abdominal pain, unspecified: Secondary | ICD-10-CM

## 2013-10-07 DIAGNOSIS — R1032 Left lower quadrant pain: Secondary | ICD-10-CM | POA: Diagnosis not present

## 2013-10-07 DIAGNOSIS — R109 Unspecified abdominal pain: Secondary | ICD-10-CM

## 2013-10-07 NOTE — Patient Instructions (Signed)
Good to see you We will get CT scan to rule out hernia. They will call you Physical therapy will be calling you as well Come back again in 3-4 weeks.

## 2013-10-07 NOTE — Progress Notes (Signed)
Pre-visit discussion using our clinic review tool. No additional management support is needed unless otherwise documented below in the visit note.  

## 2013-10-07 NOTE — Assessment & Plan Note (Addendum)
With patient history of ovarian cancer I would like to rule out reoccurring. This likely is more of a muscle injury and we will start formal physical therapy. Discussed red flags and went to seek medical attention. I will call patient after CT scan to discuss results and went to followup properly. As long as it looks musculoskeletal we would have her follow up in 3 weeks. CA 125 was within normal range.

## 2013-10-07 NOTE — Progress Notes (Signed)
  CC: Left side pain  HPI: Patient is a 68 year old female coming in for followup of piriformis syndrome. Patient was given home exercise program as well as stretching and different manual therapies that could be beneficial at home. Patient states that the piriformis portion has helped but she continues to have this left-sided groin pain. Patient doesn't currently have a past medical history significant for ovarian cancer in 2010. Patient denies any fevers or chills or any abnormal weight loss. Patient states though that this pain can catch her with certain movements. Patient states is also tender to palpation. Denies any bowel or bladder changes.  Past medical, surgical, family and social history reviewed. Medications reviewed all in the electronic medical record.   Review of Systems: No headache, visual changes, nausea, vomiting, diarrhea, constipation, dizziness, abdominal pain, skin rash, fevers, chills, night sweats, weight loss, swollen lymph nodes, body aches, joint swelling, muscle aches, chest pain, shortness of breath, mood changes.   Objective:    Blood pressure 140/80, pulse 87, temperature 98.4 F (36.9 C), temperature source Oral, resp. rate 16, weight 198 lb (89.812 kg), SpO2 96.00%.   General: No apparent distress alert and oriented x3 mood and affect normal, dressed appropriately.  HEENT: Pupils equal, extraocular movements intact Respiratory: Patient's speak in full sentences and does not appear short of breath Cardiovascular: No lower extremity edema, non tender, no erythema Skin: Warm dry intact with no signs of infection or rash on extremities or on axial skeleton. Abdomen: Abdominal exam shows the patient does have tenderness in the left inguinal area. No masses palpated and no adenopathy noted. Patient though does have involuntary guarding in this area. Patient unable to bear down and no hernia palpated though. Neuro: Cranial nerves II through XII are intact, neurovascularly  intact in all extremities with 2+ DTRs and 2+ pulses. Lymph: No lymphadenopathy of posterior or anterior cervical chain or axillae bilaterally.  Gait normal with good balance and coordination.  Patient's right leg seems to be a quarter of an inch shorter. MSK: Non tender with full range of motion and good stability and symmetric strength and tone of shoulders, elbows, wrist, knee and ankles bilaterally.  Hip: Left ROM IR: 35 Deg, ER: 45 Deg, Flexion: 120 Deg, Extension: 100 Deg, Abduction: 45 Deg, Adduction: 45 Deg Strength IR: 5/5, ER: 5/5, Flexion: 5/5, Extension: 5/5, Abduction: 5/5, Adduction: 5/5 Pelvic alignment unremarkable to inspection and palpation. Standing hip rotation and gait without trendelenburg sign / unsteadiness. Greater trochanter without tenderness to palpation.  tenderness over piriformis A positive Corky Sox but negativer FADIR. No SI joint tenderness and normal minimal SI movement. Contralateral hip unremarkable Impression and Recommendations:     This case required medical decision making of moderate complexity.

## 2013-10-09 ENCOUNTER — Other Ambulatory Visit: Payer: Self-pay | Admitting: *Deleted

## 2013-10-09 DIAGNOSIS — R109 Unspecified abdominal pain: Secondary | ICD-10-CM

## 2013-10-10 ENCOUNTER — Other Ambulatory Visit (INDEPENDENT_AMBULATORY_CARE_PROVIDER_SITE_OTHER): Payer: Medicare Other

## 2013-10-10 DIAGNOSIS — R109 Unspecified abdominal pain: Secondary | ICD-10-CM

## 2013-10-10 LAB — CREATININE, SERUM: Creatinine, Ser: 0.8 mg/dL (ref 0.4–1.2)

## 2013-10-10 LAB — BUN: BUN: 15 mg/dL (ref 6–23)

## 2013-10-11 ENCOUNTER — Ambulatory Visit (INDEPENDENT_AMBULATORY_CARE_PROVIDER_SITE_OTHER)
Admission: RE | Admit: 2013-10-11 | Discharge: 2013-10-11 | Disposition: A | Discharge status: Home / Self Care | Payer: Medicare Other | Source: Ambulatory Visit | Attending: Family Medicine | Admitting: Family Medicine

## 2013-10-11 DIAGNOSIS — R1032 Left lower quadrant pain: Secondary | ICD-10-CM | POA: Diagnosis not present

## 2013-10-11 MED ORDER — IOHEXOL 300 MG/ML  SOLN
100.0000 mL | Freq: Once | INTRAMUSCULAR | Status: AC | PRN
  Administered 2013-10-11: 100 mL via INTRAVENOUS

## 2013-10-14 ENCOUNTER — Telehealth: Payer: Self-pay | Admitting: *Deleted

## 2013-10-14 NOTE — Telephone Encounter (Signed)
Discussed findings with patient and told her no sign of new cancer and only adhesions,

## 2013-10-14 NOTE — Telephone Encounter (Signed)
Patient phoned requesting results from CT performed 10/07/13.  Please advise.  CB# [phone]

## 2013-10-16 ENCOUNTER — Ambulatory Visit: Payer: Medicare Other | Attending: Family Medicine

## 2013-10-16 DIAGNOSIS — M25559 Pain in unspecified hip: Secondary | ICD-10-CM | POA: Diagnosis not present

## 2013-10-16 DIAGNOSIS — IMO0001 Reserved for inherently not codable concepts without codable children: Secondary | ICD-10-CM | POA: Insufficient documentation

## 2013-10-21 ENCOUNTER — Ambulatory Visit: Payer: Medicare Other | Admitting: Physical Therapy

## 2013-10-21 DIAGNOSIS — M25559 Pain in unspecified hip: Secondary | ICD-10-CM | POA: Diagnosis not present

## 2013-10-21 DIAGNOSIS — IMO0001 Reserved for inherently not codable concepts without codable children: Secondary | ICD-10-CM | POA: Diagnosis not present

## 2013-10-24 ENCOUNTER — Ambulatory Visit: Payer: Medicare Other | Admitting: Physical Therapy

## 2013-10-24 DIAGNOSIS — IMO0001 Reserved for inherently not codable concepts without codable children: Secondary | ICD-10-CM | POA: Diagnosis not present

## 2013-10-24 DIAGNOSIS — M25559 Pain in unspecified hip: Secondary | ICD-10-CM | POA: Diagnosis not present

## 2013-10-28 ENCOUNTER — Ambulatory Visit: Payer: Medicare Other | Admitting: Physical Therapy

## 2013-10-28 DIAGNOSIS — M25559 Pain in unspecified hip: Secondary | ICD-10-CM | POA: Diagnosis not present

## 2013-10-28 DIAGNOSIS — IMO0001 Reserved for inherently not codable concepts without codable children: Secondary | ICD-10-CM | POA: Diagnosis not present

## 2013-10-30 ENCOUNTER — Ambulatory Visit: Payer: Medicare Other | Admitting: Physical Therapy

## 2013-10-30 DIAGNOSIS — IMO0001 Reserved for inherently not codable concepts without codable children: Secondary | ICD-10-CM | POA: Diagnosis not present

## 2013-10-30 DIAGNOSIS — M25559 Pain in unspecified hip: Secondary | ICD-10-CM | POA: Diagnosis not present

## 2013-11-05 ENCOUNTER — Encounter: Payer: Medicare Other | Admitting: Physical Therapy

## 2013-11-07 ENCOUNTER — Ambulatory Visit: Payer: Medicare Other | Attending: Family Medicine | Admitting: Physical Therapy

## 2013-11-07 DIAGNOSIS — M25559 Pain in unspecified hip: Secondary | ICD-10-CM | POA: Insufficient documentation

## 2013-11-14 ENCOUNTER — Ambulatory Visit: Payer: Medicare Other | Admitting: Physical Therapy

## 2013-11-19 ENCOUNTER — Other Ambulatory Visit: Payer: Self-pay

## 2013-11-19 DIAGNOSIS — Z1231 Encounter for screening mammogram for malignant neoplasm of breast: Secondary | ICD-10-CM

## 2013-11-27 ENCOUNTER — Other Ambulatory Visit: Payer: Self-pay | Admitting: Internal Medicine

## 2013-12-20 ENCOUNTER — Ambulatory Visit
Admission: RE | Admit: 2013-12-20 | Discharge: 2013-12-20 | Disposition: A | Discharge status: Home / Self Care | Payer: Medicare Other | Source: Ambulatory Visit

## 2013-12-20 DIAGNOSIS — Z1231 Encounter for screening mammogram for malignant neoplasm of breast: Secondary | ICD-10-CM | POA: Diagnosis not present

## 2014-01-20 ENCOUNTER — Other Ambulatory Visit (INDEPENDENT_AMBULATORY_CARE_PROVIDER_SITE_OTHER): Payer: Medicare Other

## 2014-01-20 ENCOUNTER — Encounter: Payer: Self-pay | Admitting: Internal Medicine

## 2014-01-20 ENCOUNTER — Ambulatory Visit (INDEPENDENT_AMBULATORY_CARE_PROVIDER_SITE_OTHER): Payer: Medicare Other | Admitting: Internal Medicine

## 2014-01-20 VITALS — BP 140/88 | HR 92 | Resp 16 | Wt 200.0 lb

## 2014-01-20 DIAGNOSIS — C569 Malignant neoplasm of unspecified ovary: Secondary | ICD-10-CM

## 2014-01-20 DIAGNOSIS — R197 Diarrhea, unspecified: Secondary | ICD-10-CM

## 2014-01-20 LAB — BASIC METABOLIC PANEL
BUN: 14 mg/dL (ref 6–23)
CALCIUM: 9.3 mg/dL (ref 8.4–10.5)
CO2: 24 meq/L (ref 19–32)
CREATININE: 0.8 mg/dL (ref 0.4–1.2)
Chloride: 106 mEq/L (ref 96–112)
GFR: 74.71 mL/min (ref >60.00)
Glucose, Bld: 91 mg/dL (ref 70–99)
Potassium: 3.4 mEq/L — ABNORMAL LOW (ref 3.5–5.1)
Sodium: 140 mEq/L (ref 135–145)

## 2014-01-20 LAB — SEDIMENTATION RATE: SED RATE: 37 mm/h — AB (ref 0–22)

## 2014-01-20 LAB — CBC WITH DIFFERENTIAL/PLATELET
BASOS ABS: 0 10*3/uL (ref 0.0–0.1)
Basophils Relative: 0.3 % (ref 0.0–3.0)
EOS ABS: 0.1 10*3/uL (ref 0.0–0.7)
Eosinophils Relative: 0.8 % (ref 0.0–5.0)
HCT: 36.8 % (ref 36.0–46.0)
Hemoglobin: 12.5 g/dL (ref 12.0–15.0)
LYMPHS PCT: 13 % (ref 12.0–46.0)
Lymphs Abs: 1.1 10*3/uL (ref 0.7–4.0)
MCHC: 34.1 g/dL (ref 30.0–36.0)
MCV: 85.9 fl (ref 78.0–100.0)
Monocytes Absolute: 0.4 10*3/uL (ref 0.1–1.0)
Monocytes Relative: 4.4 % (ref 3.0–12.0)
NEUTROS PCT: 81.5 % — AB (ref 43.0–77.0)
Neutro Abs: 7 10*3/uL (ref 1.4–7.7)
PLATELETS: 221 10*3/uL (ref 150.0–400.0)
RBC: 4.28 Mil/uL (ref 3.87–5.11)
RDW: 13.6 % (ref 11.5–15.5)
WBC: 8.6 10*3/uL (ref 4.0–10.5)

## 2014-01-20 LAB — TSH: TSH: 1.7 u[IU]/mL (ref 0.35–4.50)

## 2014-01-20 MED ORDER — METRONIDAZOLE 500 MG PO TABS: 500.0000 mg | ORAL_TABLET | Freq: Four times a day (QID) | ORAL | Status: DC

## 2014-01-20 MED ORDER — DIPHENOXYLATE-ATROPINE 2.5-0.025 MG PO TABS: 1.0000 | ORAL_TABLET | Freq: Four times a day (QID) | ORAL | Status: DC | PRN

## 2014-01-20 MED ORDER — SACCHAROMYCES BOULARDII 250 MG PO CAPS: 250.0000 mg | ORAL_CAPSULE | Freq: Two times a day (BID) | ORAL | Status: DC

## 2014-01-20 NOTE — Progress Notes (Signed)
Pre visit review using our clinic review tool, if applicable. No additional management support is needed unless otherwise documented below in the visit note. 

## 2014-01-20 NOTE — Patient Instructions (Signed)
Gluten free trial (no wheat products) for 4-6 weeks. OK to use gluten-free bread and gluten-free pasta.  Milk free trial (no milk, ice cream, cheese and yogurt) for 4-6 weeks. OK to use almond, coconut, rice or soy milk. "Almond breeze" brand tastes good.  

## 2014-01-20 NOTE — Progress Notes (Signed)
   Subjective:     Diarrhea  This is a chronic problem. The current episode started more than 1 year ago. The problem occurs 2 to 4 times per day. The problem has been gradually worsening. The patient states that diarrhea does not awaken her from sleep. Associated symptoms include bloating. Pertinent negatives include no abdominal pain, chills, coughing, headaches or weight loss. Treatments tried: welchol. The treatment provided moderate relief. Her past medical history is significant for bowel resection.    F/u neuropathy, elev glu, elev BP, anxiety, osteoporhosis. She did not take Cymbalta, Glucophage. Doing well on diet... Boniva caused upset stomach. Took Prolia shot.  Wt Readings from Last 3 Encounters:  01/20/14 200 lb (90.719 kg)  10/07/13 198 lb (89.812 kg)  09/23/13 195 lb (88.451 kg)   BP Readings from Last 3 Encounters:  01/20/14 140/88  10/07/13 140/80  09/23/13 138/72      Review of Systems  Constitutional: Negative for chills, weight loss, activity change, appetite change, fatigue and unexpected weight change.  HENT: Negative for congestion, mouth sores and sinus pressure.   Eyes: Negative for visual disturbance.  Respiratory: Negative for cough and chest tightness.   Gastrointestinal: Positive for diarrhea and bloating. Negative for nausea and abdominal pain.  Genitourinary: Negative for frequency, difficulty urinating and vaginal pain.  Musculoskeletal: Negative for back pain and gait problem.  Skin: Negative for pallor and rash.  Neurological: Positive for numbness. Negative for dizziness, tremors, weakness and headaches.  Psychiatric/Behavioral: Negative for suicidal ideas, confusion, sleep disturbance, dysphoric mood and agitation.         Objective:   Physical Exam  Constitutional: She appears well-developed. No distress.  obese  HENT:  Head: Normocephalic.  Right Ear: External ear normal.  Left Ear: External ear normal.  Nose: Nose normal.   Mouth/Throat: Oropharynx is clear and moist.  Eyes: Conjunctivae are normal. Pupils are equal, round, and reactive to light. Right eye exhibits no discharge. Left eye exhibits no discharge.  Neck: Normal range of motion. Neck supple. No JVD present. No tracheal deviation present. No thyromegaly present.  Cardiovascular: Normal rate, regular rhythm and normal heart sounds.   Pulmonary/Chest: No stridor. No respiratory distress. She has no wheezes.  Abdominal: Soft. Bowel sounds are normal. She exhibits no distension and no mass. There is no tenderness. There is no rebound and no guarding.  Musculoskeletal: She exhibits no edema and no tenderness.  Lymphadenopathy:    She has no cervical adenopathy.  Neurological: She displays normal reflexes. No cranial nerve deficit. She exhibits normal muscle tone. Coordination normal.  Skin: No rash noted. No erythema.  Psychiatric: She has a normal mood and affect. Her behavior is normal. Judgment and thought content normal.    Lab Results  Component Value Date   WBC 6.7 08/07/2013   HGB 12.1 08/07/2013   HCT 35.2* 08/07/2013   PLT 214.0 08/07/2013   GLUCOSE 91 08/07/2013   CHOL 166 08/07/2013   TRIG 144.0 08/07/2013   HDL 43.90 08/07/2013   LDLCALC 93 08/07/2013   ALT 26 08/07/2013   AST 25 08/07/2013   NA 139 08/07/2013   K 3.6 08/07/2013   CL 105 08/07/2013   CREATININE 0.8 10/10/2013   BUN 15 10/10/2013   CO2 24 08/07/2013   TSH 1.46 08/07/2013   HGBA1C 6.0 08/07/2013   CA125 - stable     Assessment & Plan:

## 2014-01-20 NOTE — Assessment & Plan Note (Signed)
4/15 - worse, chronic life-time

## 2014-01-20 NOTE — Assessment & Plan Note (Signed)
Remote - 2015 Dr Olevia Perches [address] [facility]

## 2014-01-21 ENCOUNTER — Other Ambulatory Visit: Payer: Medicare Other

## 2014-01-21 ENCOUNTER — Other Ambulatory Visit: Payer: Self-pay | Admitting: Internal Medicine

## 2014-01-21 DIAGNOSIS — R197 Diarrhea, unspecified: Secondary | ICD-10-CM | POA: Diagnosis not present

## 2014-01-21 LAB — CA 125: CA 125: 8 U/mL (ref 0.0–30.2)

## 2014-01-22 LAB — GIARDIA/CRYPTOSPORIDIUM (EIA)
Cryptosporidium Screen (EIA): NEGATIVE
Giardia Screen (EIA): NEGATIVE

## 2014-01-22 LAB — C. DIFFICILE GDH AND TOXIN A/B
C. difficile GDH: NOT DETECTED
C. difficile Toxin A/B: NOT DETECTED

## 2014-01-29 ENCOUNTER — Other Ambulatory Visit: Payer: Self-pay | Admitting: *Deleted

## 2014-01-29 MED ORDER — COLESEVELAM HCL 3.75 G PO PACK: 1.0000 | PACK | ORAL | Status: DC

## 2014-01-30 ENCOUNTER — Encounter: Payer: Self-pay | Admitting: *Deleted

## 2014-02-02 ENCOUNTER — Other Ambulatory Visit: Payer: Self-pay | Admitting: Oncology

## 2014-02-03 ENCOUNTER — Ambulatory Visit (HOSPITAL_BASED_OUTPATIENT_CLINIC_OR_DEPARTMENT_OTHER): Payer: Medicare Other

## 2014-02-03 ENCOUNTER — Ambulatory Visit (HOSPITAL_BASED_OUTPATIENT_CLINIC_OR_DEPARTMENT_OTHER): Payer: Medicare Other | Admitting: Oncology

## 2014-02-03 ENCOUNTER — Encounter: Payer: Self-pay | Admitting: Oncology

## 2014-02-03 ENCOUNTER — Telehealth: Payer: Self-pay | Admitting: *Deleted

## 2014-02-03 ENCOUNTER — Telehealth: Payer: Self-pay | Admitting: Oncology

## 2014-02-03 ENCOUNTER — Other Ambulatory Visit: Payer: Medicare Other

## 2014-02-03 VITALS — BP 138/70 | HR 76 | Temp 97.5°F | Resp 18 | Ht 63.5 in | Wt 195.4 lb

## 2014-02-03 DIAGNOSIS — N390 Urinary tract infection, site not specified: Secondary | ICD-10-CM

## 2014-02-03 DIAGNOSIS — R197 Diarrhea, unspecified: Secondary | ICD-10-CM

## 2014-02-03 DIAGNOSIS — D490 Neoplasm of unspecified behavior of digestive system: Secondary | ICD-10-CM

## 2014-02-03 DIAGNOSIS — E119 Type 2 diabetes mellitus without complications: Secondary | ICD-10-CM | POA: Diagnosis not present

## 2014-02-03 DIAGNOSIS — I10 Essential (primary) hypertension: Secondary | ICD-10-CM

## 2014-02-03 DIAGNOSIS — C569 Malignant neoplasm of unspecified ovary: Secondary | ICD-10-CM

## 2014-02-03 DIAGNOSIS — C5700 Malignant neoplasm of unspecified fallopian tube: Secondary | ICD-10-CM

## 2014-02-03 LAB — URINALYSIS, MICROSCOPIC - CHCC
BLOOD: NEGATIVE
Bilirubin (Urine): NEGATIVE
GLUCOSE UR CHCC: NEGATIVE mg/dL
KETONES: NEGATIVE mg/dL
Leukocyte Esterase: NEGATIVE
Nitrite: NEGATIVE
Protein: <30 mg/dL
RBC / HPF: NEGATIVE (ref 0–2)
SPECIFIC GRAVITY, URINE: 1.025 (ref 1.003–1.035)
Urobilinogen, UR: 0.2 mg/dL (ref 0.2–1)
pH: 6 (ref 4.6–8.0)

## 2014-02-03 NOTE — Progress Notes (Signed)
OFFICE PROGRESS NOTE   02/03/2014   Physicians:D.ClarkePearson, A.[name], D.Brodie   INTERVAL HISTORY:  Patient is seen, alone for visit, in scheduled follow up of IIIC serous fallopian carcinoma, on observation since she completed adjuvant chemotherapy in June 2010. She saw Dr Josephina Shih in 09-2013 and will see him again in a year. Last imaging was CT AP in 10-2013, done because of back and abdominal pain, with nothing of concern for recurrent gyn cancer. Ongoing most bothersome problem is diarrhea, which she has had for several years, but which has progressively worsened to the point of multiple loose stools daily such that she has a difficult time functioning, is unable to be away from bathroom to shop or travel etc. She had colonoscopy by Dr Olevia Perches 3 years ago. She has been on lactose free diet per Dr [name] for past week, possibly is slightly improved. She has begun OTC potassium per Dr [name], and is for recheck potassium on 02-24-14. C diff and routine stool studies were all negative on 01-21-14.    ONCOLOGIC HISTORY Patient was diagnosed with IIIC serous fallopian carcinoma at [facility] and omentectomy 10-07-2008, this optimal debulking with only residual being small involvement on peritoneal surfaces at completion of the surgery. She had 6 cycles of taxol/ carboplatin from 11-12-2008 thru 03-03-2009. She has been free of disease since that treatment was completed. Last imaging was PET in 04-2009, and last visit with Dr Josephina Shih was 10-05-12 and she will be seen back by gyn oncology in a year. Preoperative CA 125 was 206 in Jan 2010; CA 125 in Jan 2014 was 6.9 and was 8.0 on 01-20-14  Review of systems as above, also: No fever. No bleeding. No respiratory symptoms. No N/V. No LE swelling. No bladder symptoms. Remainder of 10 point Review of Systems negative.  Objective:  Vital signs in last 24 hours:  BP 138/70  Pulse 76  Temp(Src) 97.5 F (36.4 C) (Oral)  Resp 18  Ht 5' 3.5"  (1.613 m)  Wt 195 lb 6.4 oz (88.633 kg)  BMI 34.07 kg/m2  SpO2 97% Weight is down 2 lbs from 02-2013. Alert, oriented and appropriate. Ambulatory without difficulty.    HEENT:PERRL, sclerae not icteric. Oral mucosa moist without lesions, posterior pharynx clear.  Neck supple. No JVD.  Lymphatics:no cervical,suraclavicular, axillary or inguinal adenopathy Resp: clear to auscultation bilaterally and normal percussion bilaterally Cardio: regular rate and rhythm. No gallop. GI: soft, nontender, not distended, no mass or organomegaly. Normally active bowel sounds. Surgical incision not remarkable. Musculoskeletal/ Extremities: without pitting edema, cords, tenderness Neuro: CN, motor, sensory, cerebellar nonfocal Skin without rash, ecchymosis, petechiae Breasts: without dominant mass, skin or nipple findings. Axillae benign.  Lab Results:  CBC 01-20-14 with WBC 8.6, hgb 12.5, plt 221 BMET 01-20-14 with K 3.4, otherwise normal including creat 0.8 CA 125 8.0 TSH 1.7 Studies/Results:  CT ABDOMEN AND PELVIS WITH CONTRAST   COMPARISON: 04/07/2009.  FINDINGS:  Imaging through the lower chest is unremarkable.  No focal abnormality is seen in the liver or spleen. Mild  circumferential wall thickening in the distal esophagus (image 13)  raises the question of esophagitis although the appearance is stable  since 04/07/2009. [name] Stomach is unremarkable. The duodenum, pancreas,  gallbladder, and adrenal glands are normal. Probable tiny cortical  cyst in the posterior right kidney. Kidneys otherwise have normal  imaging features.  No abdominal aortic aneurysm. No free fluid in the abdomen. No  evidence for abdominal lymphadenopathy.  Adhesions are seen in the anterior abdomen. Patient  is status post  omentectomy.  Imaging through the pelvis shows no free intraperitoneal fluid.  There is no pelvic sidewall lymphadenopathy. The uterus is  surgically absent. Bladder is unremarkable.  No  substantial diverticular disease in the colon. There is no  colonic diverticulitis. The terminal ileum is normal.  Nonvisualization of the appendix is consistent with the reported  history of appendectomy.  Sclerotic changes at the symphysis pubis are stable. Bone windows  reveal no worrisome lytic or sclerotic osseous lesions.  IMPRESSION:  Stable exam. No new or progressive findings. No evidence to explain  this patient's history of left lower quadrant abdominal pain.   LUMBAR SPINE - COMPLETE 4+ VIEW   09-23-13 COMPARISON: CT abdomen pelvis 09/23/2008  FINDINGS:  Five lumbar type vertebral bodies.  Normal lumbar lordosis.  Mild superior endplate compression fracture deformity at L1, age  indeterminate, although new from 2010. No retropulsion.  Mild multilevel degenerative changes.  IMPRESSION:  Mild superior endplate compression fracture deformity at L1, age  indeterminate, although new from 2010.  Mild multilevel degenerative changes   DIGITAL SCREENING BILATERAL MAMMOGRAM WITH CAD 12-20-13   [facility] COMPARISON: Previous exam(s).  ACR Breast Density Category c: The breast tissue is heterogeneously  dense, which may obscure small masses.  FINDINGS:  There are no findings suspicious for malignancy. Images were  processed with CAD.  IMPRESSION:  No mammographic evidence of malignancy. A result letter of this  screening mammogram will be mailed directly to the patient.  RECOMMENDATION:  Screening mammogram in one year. (Code:[id number])  BI-RADS CATEGORY 1: Negative.     Medications: I have reviewed the patient's current medications. As she has been on aciphex for 5-6 years and as this can have low risk of diarrhea, I have asked her to hold this medication at least until she sees Dr [name] back. She has lomotil available but has used this only once in 24 hours; I have encouraged her to use the lomotil 4x daily also until she sees Dr [name] again.  DISCUSSION: have  reviewed history re diarrhea, medication recommendations as above.   Assessment/Plan:  1.History of stage IIIC serous fallopian carcinoma, now out over 5 years from diagnosis and no obvious recurrent or active disease, including by recent CT AP. She will see gyn oncology in 6 months with repeat CA 125 shortly prior to that visit, then medical oncology in a year from now with CBC,CMET, CA 125  2.obesity:  BMI 34. Not exercising regularly, in part due to GI problems 3.type 2 diabetes  4. HTN, GERD, IBS, glaucoma  .5.progressive worsening of diarrhea: have encouraged her to continue lactose free diet and other med changes as above prior to follow up with Dr [name] in 3 weeks. She is known to Dr Delfin Edis. 6.Family history of dystonic reactions to compazine   TIme spent 25 min including >50% counseling and coordination of care.    Gordy Levan, MD   02/03/2014, 12:12 PM

## 2014-02-03 NOTE — Patient Instructions (Signed)
Until you see Dr [name] on 02-24-14, continue over the counter potassium. Take that bottle with you to his appointment. Take the lomotil 4x daily. Stop Aciphex, as this can very rarely cause diarrhea. Continue lactose free diet - read all labels.

## 2014-02-03 NOTE — Telephone Encounter (Signed)
Message copied by Patton Salles on Mon Feb 03, 2014  2:56 PM ------      Message from: Evlyn Clines P      Created: Mon Feb 03, 2014  1:42 PM       Labs seen and need follow up:  Please let her know urine does not look infected, no blood and bilirubin not elevated. It is fairly concentrated and she should push fluids as we discussed. ------

## 2014-02-03 NOTE — Telephone Encounter (Signed)
per pof to sch appt w/CP-sch By kim-LL sch not out yet for next year-adv pt to call  us in July in hope sch will be made out-pt understood

## 2014-02-03 NOTE — Telephone Encounter (Signed)
Pt notified of results below. Verbalized understanding.  

## 2014-02-04 ENCOUNTER — Other Ambulatory Visit: Payer: Self-pay | Admitting: Oncology

## 2014-02-04 ENCOUNTER — Other Ambulatory Visit: Payer: Medicare Other

## 2014-02-04 ENCOUNTER — Ambulatory Visit: Payer: Medicare Other

## 2014-02-04 DIAGNOSIS — C5700 Malignant neoplasm of unspecified fallopian tube: Secondary | ICD-10-CM

## 2014-02-04 LAB — URINE CULTURE

## 2014-02-05 ENCOUNTER — Ambulatory Visit: Payer: Medicare Other | Admitting: Internal Medicine

## 2014-02-06 DIAGNOSIS — H01009 Unspecified blepharitis unspecified eye, unspecified eyelid: Secondary | ICD-10-CM | POA: Diagnosis not present

## 2014-02-06 DIAGNOSIS — H40059 Ocular hypertension, unspecified eye: Secondary | ICD-10-CM | POA: Diagnosis not present

## 2014-02-24 ENCOUNTER — Ambulatory Visit (INDEPENDENT_AMBULATORY_CARE_PROVIDER_SITE_OTHER): Payer: Medicare Other | Admitting: Internal Medicine

## 2014-02-24 ENCOUNTER — Encounter: Payer: Self-pay | Admitting: Internal Medicine

## 2014-02-24 VITALS — BP 120/60 | HR 80 | Temp 98.2°F | Resp 16 | Wt 190.0 lb

## 2014-02-24 DIAGNOSIS — I10 Essential (primary) hypertension: Secondary | ICD-10-CM | POA: Diagnosis not present

## 2014-02-24 DIAGNOSIS — R42 Dizziness and giddiness: Secondary | ICD-10-CM | POA: Diagnosis not present

## 2014-02-24 DIAGNOSIS — E785 Hyperlipidemia, unspecified: Secondary | ICD-10-CM | POA: Diagnosis not present

## 2014-02-24 DIAGNOSIS — R7309 Other abnormal glucose: Secondary | ICD-10-CM

## 2014-02-24 DIAGNOSIS — E538 Deficiency of other specified B group vitamins: Secondary | ICD-10-CM

## 2014-02-24 DIAGNOSIS — R739 Hyperglycemia, unspecified: Secondary | ICD-10-CM

## 2014-02-24 DIAGNOSIS — R197 Diarrhea, unspecified: Secondary | ICD-10-CM | POA: Diagnosis not present

## 2014-02-24 DIAGNOSIS — C5700 Malignant neoplasm of unspecified fallopian tube: Secondary | ICD-10-CM

## 2014-02-24 NOTE — Assessment & Plan Note (Signed)
Continue with current prescription therapy as reflected on the Med list.  

## 2014-02-24 NOTE — Assessment & Plan Note (Signed)
Better off milk Trying gluten free (low gluten)

## 2014-02-24 NOTE — Patient Instructions (Signed)
BP Readings from Last 3 Encounters:  02/24/14 120/60  02/03/14 138/70  01/20/14 140/88   Wt Readings from Last 3 Encounters:  02/24/14 190 lb (86.183 kg)  02/03/14 195 lb 6.4 oz (88.633 kg)  01/20/14 200 lb (90.719 kg)

## 2014-02-24 NOTE — Assessment & Plan Note (Signed)
BMET 

## 2014-02-24 NOTE — Progress Notes (Signed)
Pre visit review using our clinic review tool, if applicable. No additional management support is needed unless otherwise documented below in the visit note. 

## 2014-02-24 NOTE — Progress Notes (Signed)
   Subjective:     Diarrhea  This is a chronic problem. The current episode started more than 1 year ago. The problem occurs 2 to 4 times per day. The problem has been gradually improving (off milk, low gluten). The patient states that diarrhea does not awaken her from sleep. Associated symptoms include bloating. Pertinent negatives include no abdominal pain, chills, coughing, headaches or weight loss. Treatments tried: welchol. The treatment provided moderate relief. Her past medical history is significant for bowel resection.    F/u neuropathy, elev glu, elev BP, anxiety, osteoporhosis. She did not take Cymbalta, Glucophage. Doing well on diet... Boniva caused upset stomach. Took Prolia shot.  Wt Readings from Last 3 Encounters:  02/24/14 190 lb (86.183 kg)  02/03/14 195 lb 6.4 oz (88.633 kg)  01/20/14 200 lb (90.719 kg)   BP Readings from Last 3 Encounters:  02/24/14 120/60  02/03/14 138/70  01/20/14 140/88      Review of Systems  Constitutional: Negative for chills, weight loss, activity change, appetite change, fatigue and unexpected weight change.  HENT: Negative for congestion, mouth sores and sinus pressure.   Eyes: Negative for visual disturbance.  Respiratory: Negative for cough and chest tightness.   Gastrointestinal: Positive for diarrhea and bloating. Negative for nausea and abdominal pain.  Genitourinary: Negative for frequency, difficulty urinating and vaginal pain.  Musculoskeletal: Negative for back pain and gait problem.  Skin: Negative for pallor and rash.  Neurological: Positive for numbness. Negative for dizziness, tremors, weakness and headaches.  Psychiatric/Behavioral: Negative for suicidal ideas, confusion, sleep disturbance, dysphoric mood and agitation.         Objective:   Physical Exam  Constitutional: She appears well-developed. No distress.  obese  HENT:  Head: Normocephalic.  Right Ear: External ear normal.  Left Ear: External ear normal.   Nose: Nose normal.  Mouth/Throat: Oropharynx is clear and moist.  Eyes: Conjunctivae are normal. Pupils are equal, round, and reactive to light. Right eye exhibits no discharge. Left eye exhibits no discharge.  Neck: Normal range of motion. Neck supple. No JVD present. No tracheal deviation present. No thyromegaly present.  Cardiovascular: Normal rate, regular rhythm and normal heart sounds.   Pulmonary/Chest: No stridor. No respiratory distress. She has no wheezes.  Abdominal: Soft. Bowel sounds are normal. She exhibits no distension and no mass. There is no tenderness. There is no rebound and no guarding.  Musculoskeletal: She exhibits no edema and no tenderness.  Lymphadenopathy:    She has no cervical adenopathy.  Neurological: She displays normal reflexes. No cranial nerve deficit. She exhibits normal muscle tone. Coordination normal.  Skin: No rash noted. No erythema.  Psychiatric: She has a normal mood and affect. Her behavior is normal. Judgment and thought content normal.    Lab Results  Component Value Date   WBC 8.6 01/20/2014   HGB 12.5 01/20/2014   HCT 36.8 01/20/2014   PLT 221.0 01/20/2014   GLUCOSE 91 01/20/2014   CHOL 166 08/07/2013   TRIG 144.0 08/07/2013   HDL 43.90 08/07/2013   LDLCALC 93 08/07/2013   ALT 26 08/07/2013   AST 25 08/07/2013   NA 140 01/20/2014   K 3.4* 01/20/2014   CL 106 01/20/2014   CREATININE 0.8 01/20/2014   BUN 14 01/20/2014   CO2 24 01/20/2014   TSH 1.70 01/20/2014   HGBA1C 6.0 08/07/2013   CA125 - stable     Assessment & Plan:

## 2014-02-24 NOTE — Assessment & Plan Note (Signed)
Better  

## 2014-02-24 NOTE — Assessment & Plan Note (Signed)
Low fat diet

## 2014-02-24 NOTE — Assessment & Plan Note (Signed)
Dr Carlis Abbott Dr Marko Plume

## 2014-02-24 NOTE — Assessment & Plan Note (Signed)
6/15 one episode - could be related to Clonidine

## 2014-02-25 ENCOUNTER — Telehealth: Payer: Self-pay | Admitting: Internal Medicine

## 2014-02-25 ENCOUNTER — Encounter: Payer: Self-pay | Admitting: Internal Medicine

## 2014-02-25 NOTE — Telephone Encounter (Signed)
Relevant patient education assigned to patient using Emmi. ° °

## 2014-02-26 ENCOUNTER — Telehealth: Payer: Self-pay | Admitting: Internal Medicine

## 2014-02-26 NOTE — Telephone Encounter (Signed)
I have sent patient's info to [employer] for insurance verification. I will notify you as soon as I have a response from them. Thank you.

## 2014-02-28 ENCOUNTER — Telehealth: Payer: Self-pay | Admitting: *Deleted

## 2014-02-28 NOTE — Telephone Encounter (Signed)
Pt's spouse informed of below.       [name] at 02/26/2014 10:57 AM     Status: Signed        I have sent patient's info to [employer] for insurance verification. I will notify you as soon as I have a response from them. Thank you.

## 2014-02-28 NOTE — Telephone Encounter (Signed)
Left msg on triage stating she spoke with Erline Levine about getting the prolia injection. Wanting to speak with stacey check status...[name]

## 2014-03-05 NOTE — Telephone Encounter (Signed)
Notified pt with information below on the prolia. Transferred to scheduler to make nurse visit appt. Inform Kisha to order med...[name]

## 2014-03-05 NOTE — Telephone Encounter (Signed)
I have rec'd patient's insurance verification for Prolia injection.  Pt's MCR plan will cover 80%, then her secondary [facility] plan coordinates subject to a $400, which is met and considers remaining at 80% of primary allowable up to a $1900 out of pocket max ($960 met).  This means pt's estimated responsibility will be approximately $36. I have sent a copy of this to be scanned into patient's chart. If you have any questions, please let me know. Thank you.

## 2014-03-13 DIAGNOSIS — Z85828 Personal history of other malignant neoplasm of skin: Secondary | ICD-10-CM | POA: Diagnosis not present

## 2014-03-13 DIAGNOSIS — L719 Rosacea, unspecified: Secondary | ICD-10-CM | POA: Diagnosis not present

## 2014-03-13 DIAGNOSIS — L821 Other seborrheic keratosis: Secondary | ICD-10-CM | POA: Diagnosis not present

## 2014-03-13 DIAGNOSIS — C4441 Basal cell carcinoma of skin of scalp and neck: Secondary | ICD-10-CM | POA: Diagnosis not present

## 2014-03-13 DIAGNOSIS — L820 Inflamed seborrheic keratosis: Secondary | ICD-10-CM | POA: Diagnosis not present

## 2014-03-13 DIAGNOSIS — D1801 Hemangioma of skin and subcutaneous tissue: Secondary | ICD-10-CM | POA: Diagnosis not present

## 2014-03-14 ENCOUNTER — Ambulatory Visit (INDEPENDENT_AMBULATORY_CARE_PROVIDER_SITE_OTHER): Payer: Medicare Other

## 2014-03-14 DIAGNOSIS — M810 Age-related osteoporosis without current pathological fracture: Secondary | ICD-10-CM | POA: Diagnosis not present

## 2014-03-14 MED ORDER — DENOSUMAB 60 MG/ML ~~LOC~~ SOLN
60.0000 mg | Freq: Once | SUBCUTANEOUS | Status: AC
  Administered 2014-03-14: 60 mg via SUBCUTANEOUS

## 2014-05-27 ENCOUNTER — Encounter: Payer: Self-pay | Admitting: Internal Medicine

## 2014-05-27 ENCOUNTER — Other Ambulatory Visit (INDEPENDENT_AMBULATORY_CARE_PROVIDER_SITE_OTHER): Payer: Medicare Other

## 2014-05-27 ENCOUNTER — Ambulatory Visit (INDEPENDENT_AMBULATORY_CARE_PROVIDER_SITE_OTHER): Payer: Medicare Other | Admitting: Internal Medicine

## 2014-05-27 VITALS — BP 140/74 | HR 80 | Temp 98.6°F | Wt 186.0 lb

## 2014-05-27 DIAGNOSIS — M545 Low back pain, unspecified: Secondary | ICD-10-CM | POA: Diagnosis not present

## 2014-05-27 DIAGNOSIS — R109 Unspecified abdominal pain: Secondary | ICD-10-CM | POA: Diagnosis not present

## 2014-05-27 DIAGNOSIS — I10 Essential (primary) hypertension: Secondary | ICD-10-CM

## 2014-05-27 DIAGNOSIS — Z23 Encounter for immunization: Secondary | ICD-10-CM

## 2014-05-27 DIAGNOSIS — R739 Hyperglycemia, unspecified: Secondary | ICD-10-CM

## 2014-05-27 DIAGNOSIS — E538 Deficiency of other specified B group vitamins: Secondary | ICD-10-CM

## 2014-05-27 DIAGNOSIS — R197 Diarrhea, unspecified: Secondary | ICD-10-CM

## 2014-05-27 DIAGNOSIS — E785 Hyperlipidemia, unspecified: Secondary | ICD-10-CM

## 2014-05-27 DIAGNOSIS — R7309 Other abnormal glucose: Secondary | ICD-10-CM | POA: Diagnosis not present

## 2014-05-27 DIAGNOSIS — M810 Age-related osteoporosis without current pathological fracture: Secondary | ICD-10-CM | POA: Diagnosis not present

## 2014-05-27 LAB — LIPID PANEL
Cholesterol: 179 mg/dL (ref 0–200)
HDL: 39.1 mg/dL (ref >39.00)
LDL Cholesterol: 113 mg/dL — ABNORMAL HIGH (ref 0–99)
NONHDL: 139.9
Total CHOL/HDL Ratio: 5
Triglycerides: 133 mg/dL (ref 0.0–149.0)
VLDL: 26.6 mg/dL (ref 0.0–40.0)

## 2014-05-27 LAB — HEPATIC FUNCTION PANEL
ALT: 25 U/L (ref 0–35)
AST: 24 U/L (ref 0–37)
Albumin: 4.1 g/dL (ref 3.5–5.2)
Alkaline Phosphatase: 62 U/L (ref 39–117)
Bilirubin, Direct: 0.1 mg/dL (ref 0.0–0.3)
Total Bilirubin: 0.9 mg/dL (ref 0.2–1.2)
Total Protein: 7.7 g/dL (ref 6.0–8.3)

## 2014-05-27 LAB — URINALYSIS, ROUTINE W REFLEX MICROSCOPIC
BILIRUBIN URINE: NEGATIVE
KETONES UR: NEGATIVE
Nitrite: NEGATIVE
PH: 6 (ref 5.0–8.0)
Specific Gravity, Urine: 1.01 (ref 1.000–1.030)
Total Protein, Urine: NEGATIVE
Urine Glucose: NEGATIVE
Urobilinogen, UA: 0.2 (ref 0.0–1.0)

## 2014-05-27 LAB — CBC WITH DIFFERENTIAL/PLATELET
Basophils Absolute: 0 10*3/uL (ref 0.0–0.1)
Basophils Relative: 0.5 % (ref 0.0–3.0)
EOS PCT: 1.7 % (ref 0.0–5.0)
Eosinophils Absolute: 0.1 10*3/uL (ref 0.0–0.7)
HCT: 34.8 % — ABNORMAL LOW (ref 36.0–46.0)
Hemoglobin: 11.8 g/dL — ABNORMAL LOW (ref 12.0–15.0)
LYMPHS PCT: 26 % (ref 12.0–46.0)
Lymphs Abs: 1.6 10*3/uL (ref 0.7–4.0)
MCHC: 34 g/dL (ref 30.0–36.0)
MCV: 87.2 fl (ref 78.0–100.0)
Monocytes Absolute: 0.4 10*3/uL (ref 0.1–1.0)
Monocytes Relative: 6.8 % (ref 3.0–12.0)
NEUTROS PCT: 65 % (ref 43.0–77.0)
Neutro Abs: 4 10*3/uL (ref 1.4–7.7)
Platelets: 214 10*3/uL (ref 150.0–400.0)
RBC: 3.99 Mil/uL (ref 3.87–5.11)
RDW: 13.5 % (ref 11.5–15.5)
WBC: 6.2 10*3/uL (ref 4.0–10.5)

## 2014-05-27 LAB — BASIC METABOLIC PANEL
BUN: 9 mg/dL (ref 6–23)
CO2: 25 mEq/L (ref 19–32)
Calcium: 8.8 mg/dL (ref 8.4–10.5)
Chloride: 107 mEq/L (ref 96–112)
Creatinine, Ser: 0.7 mg/dL (ref 0.4–1.2)
GFR: 88.32 mL/min (ref >60.00)
GLUCOSE: 99 mg/dL (ref 70–99)
POTASSIUM: 3.6 meq/L (ref 3.5–5.1)
Sodium: 139 mEq/L (ref 135–145)

## 2014-05-27 LAB — TSH: TSH: 1.21 u[IU]/mL (ref 0.35–4.50)

## 2014-05-27 LAB — HEMOGLOBIN A1C: Hgb A1c MFr Bld: 5.8 % (ref 4.6–6.5)

## 2014-05-27 MED ORDER — COLESEVELAM HCL 625 MG PO TABS: 1250.0000 mg | ORAL_TABLET | Freq: Two times a day (BID) | ORAL | Status: DC

## 2014-05-27 MED ORDER — DIPHENOXYLATE-ATROPINE 2.5-0.025 MG PO TABS
1.0000 | ORAL_TABLET | Freq: Four times a day (QID) | ORAL | Status: DC | PRN
Start: 2014-05-27 — End: 2015-05-15

## 2014-05-27 MED ORDER — COLESEVELAM HCL 625 MG PO TABS: 1875.0000 mg | ORAL_TABLET | Freq: Two times a day (BID) | ORAL | Status: DC

## 2014-05-27 NOTE — Assessment & Plan Note (Signed)
Continue with current prescription therapy as reflected on the Med list.  Potential benefits of a long term Prolia use as well as potential risks  and complications were explained to the patient and were aknowledged. Info on Strontium was given [facility] DEXA

## 2014-05-27 NOTE — Progress Notes (Signed)
Pre visit review using our clinic review tool, if applicable. No additional management support is needed unless otherwise documented below in the visit note. 

## 2014-05-27 NOTE — Assessment & Plan Note (Signed)
Continue with current prescription therapy as reflected on the Med list.  

## 2014-05-27 NOTE — Progress Notes (Signed)
Patient ID: [name], female   DOB: 1945/11/25, 68 y.o.   MRN: [id number]   Subjective:     Diarrhea  This is a chronic problem. The current episode started more than 1 year ago. The problem occurs 2 to 4 times per day. The problem has been gradually improving (off milk, low gluten). The patient states that diarrhea does not awaken her from sleep. Associated symptoms include bloating. Pertinent negatives include no abdominal pain, chills, coughing, headaches or weight loss. Treatments tried: welchol. The treatment provided moderate relief. Her past medical history is significant for bowel resection.    F/u neuropathy, elev glu, elev BP, anxiety, osteoporhosis. She did not take Cymbalta, Glucophage. Doing well on diet... Boniva caused upset stomach. Took Prolia shot.  Wt Readings from Last 3 Encounters:  05/27/14 186 lb (84.369 kg)  02/24/14 190 lb (86.183 kg)  02/03/14 195 lb 6.4 oz (88.633 kg)   BP Readings from Last 3 Encounters:  05/27/14 140/74  02/24/14 120/60  02/03/14 138/70      Review of Systems  Constitutional: Negative for chills, weight loss, activity change, appetite change, fatigue and unexpected weight change.  HENT: Negative for congestion, mouth sores and sinus pressure.   Eyes: Negative for visual disturbance.  Respiratory: Negative for cough and chest tightness.   Gastrointestinal: Positive for diarrhea and bloating. Negative for nausea and abdominal pain.  Genitourinary: Negative for frequency, difficulty urinating and vaginal pain.  Musculoskeletal: Negative for back pain and gait problem.  Skin: Negative for pallor and rash.  Neurological: Positive for numbness. Negative for dizziness, tremors, weakness and headaches.  Psychiatric/Behavioral: Negative for suicidal ideas, confusion, sleep disturbance, dysphoric mood and agitation.         Objective:   Physical Exam  Constitutional: She appears well-developed. No distress.  obese  HENT:  Head:  Normocephalic.  Right Ear: External ear normal.  Left Ear: External ear normal.  Nose: Nose normal.  Mouth/Throat: Oropharynx is clear and moist.  Eyes: Conjunctivae are normal. Pupils are equal, round, and reactive to light. Right eye exhibits no discharge. Left eye exhibits no discharge.  Neck: Normal range of motion. Neck supple. No JVD present. No tracheal deviation present. No thyromegaly present.  Cardiovascular: Normal rate, regular rhythm and normal heart sounds.   Pulmonary/Chest: No stridor. No respiratory distress. She has no wheezes.  Abdominal: Soft. Bowel sounds are normal. She exhibits no distension and no mass. There is no tenderness. There is no rebound and no guarding.  Musculoskeletal: She exhibits no edema and no tenderness.  Lymphadenopathy:    She has no cervical adenopathy.  Neurological: She displays normal reflexes. No cranial nerve deficit. She exhibits normal muscle tone. Coordination normal.  Skin: No rash noted. No erythema.  Psychiatric: She has a normal mood and affect. Her behavior is normal. Judgment and thought content normal.    Lab Results  Component Value Date   WBC 8.6 01/20/2014   HGB 12.5 01/20/2014   HCT 36.8 01/20/2014   PLT 221.0 01/20/2014   GLUCOSE 91 01/20/2014   CHOL 166 08/07/2013   TRIG 144.0 08/07/2013   HDL 43.90 08/07/2013   LDLCALC 93 08/07/2013   ALT 26 08/07/2013   AST 25 08/07/2013   NA 140 01/20/2014   K 3.4* 01/20/2014   CL 106 01/20/2014   CREATININE 0.8 01/20/2014   BUN 14 01/20/2014   CO2 24 01/20/2014   TSH 1.70 01/20/2014   HGBA1C 6.0 08/07/2013   CA125 - stable  Assessment & Plan:

## 2014-05-27 NOTE — Assessment & Plan Note (Signed)
Labs

## 2014-05-27 NOTE — Assessment & Plan Note (Signed)
welchol helps Prolia aggravates

## 2014-05-27 NOTE — Assessment & Plan Note (Signed)
Worse on Prolia

## 2014-06-03 ENCOUNTER — Other Ambulatory Visit: Payer: Self-pay | Admitting: *Deleted

## 2014-06-03 ENCOUNTER — Ambulatory Visit (INDEPENDENT_AMBULATORY_CARE_PROVIDER_SITE_OTHER)
Admission: RE | Admit: 2014-06-03 | Discharge: 2014-06-03 | Disposition: A | Discharge status: Home / Self Care | Payer: Medicare Other | Source: Ambulatory Visit | Attending: Internal Medicine | Admitting: Internal Medicine

## 2014-06-03 DIAGNOSIS — M810 Age-related osteoporosis without current pathological fracture: Secondary | ICD-10-CM | POA: Diagnosis not present

## 2014-06-03 MED ORDER — COLESTIPOL HCL 1 G PO TABS: 1.0000 g | ORAL_TABLET | Freq: Two times a day (BID) | ORAL | Status: DC

## 2014-06-10 ENCOUNTER — Encounter: Payer: Self-pay | Admitting: Internal Medicine

## 2014-06-10 ENCOUNTER — Ambulatory Visit (INDEPENDENT_AMBULATORY_CARE_PROVIDER_SITE_OTHER): Payer: Medicare Other | Admitting: Internal Medicine

## 2014-06-10 VITALS — BP 124/80 | HR 82 | Temp 97.7°F | Resp 18 | Ht 63.5 in | Wt 187.8 lb

## 2014-06-10 DIAGNOSIS — D490 Neoplasm of unspecified behavior of digestive system: Secondary | ICD-10-CM | POA: Diagnosis not present

## 2014-06-10 DIAGNOSIS — R1032 Left lower quadrant pain: Secondary | ICD-10-CM

## 2014-06-10 MED ORDER — TRAMADOL HCL 50 MG PO TABS: 50.0000 mg | ORAL_TABLET | Freq: Two times a day (BID) | ORAL | Status: DC | PRN

## 2014-06-10 NOTE — Assessment & Plan Note (Signed)
10/15 LLQ CT abd ok 2/15

## 2014-06-10 NOTE — Assessment & Plan Note (Signed)
Ovarian cancer Jan 2010 Discussed Repeat abd CT

## 2014-06-10 NOTE — Progress Notes (Signed)
Patient ID: [name], female   DOB: 1946/06/19, 67 y.o.   MRN: [id number] Patient ID: [name], female   DOB: 10-30-1945, 68 y.o.   MRN: [id number]   Subjective:     Diarrhea  This is a chronic problem. The current episode started more than 1 year ago. The problem occurs 2 to 4 times per day. The problem has been gradually improving (off milk, low gluten). The patient states that diarrhea does not awaken her from sleep. Associated symptoms include bloating. Pertinent negatives include no abdominal pain, chills, coughing, headaches or weight loss. Treatments tried: welchol. The treatment provided moderate relief. Her past medical history is significant for bowel resection.  Abdominal Pain This is a new problem. The current episode started in the past 7 days (she ha some in [place]). The onset quality is gradual. The problem occurs 2 to 4 times per day. The problem has been unchanged. The pain is located in the LLQ. The pain is moderate. The quality of the pain is dull. Associated symptoms include diarrhea. Pertinent negatives include no frequency, headaches, nausea or weight loss. The pain is aggravated by movement. She has tried acetaminophen for the symptoms. The treatment provided no relief. Her past medical history is significant for abdominal surgery.    F/u neuropathy, elev glu, elev BP, anxiety, osteoporhosis. She did not take Cymbalta, Glucophage. Doing well on diet... Boniva caused upset stomach. Took Prolia shot.  Wt Readings from Last 3 Encounters:  06/10/14 187 lb 12.8 oz (85.186 kg)  05/27/14 186 lb (84.369 kg)  02/24/14 190 lb (86.183 kg)   BP Readings from Last 3 Encounters:  06/10/14 124/80  05/27/14 140/74  02/24/14 120/60      Review of Systems  Constitutional: Negative for chills, weight loss, activity change, appetite change, fatigue and unexpected weight change.  HENT: Negative for congestion, mouth sores and sinus pressure.   Eyes: Negative for visual  disturbance.  Respiratory: Negative for cough and chest tightness.   Gastrointestinal: Positive for diarrhea and bloating. Negative for nausea and abdominal pain.  Genitourinary: Negative for frequency, difficulty urinating and vaginal pain.  Musculoskeletal: Negative for back pain and gait problem.  Skin: Negative for pallor and rash.  Neurological: Positive for numbness. Negative for dizziness, tremors, weakness and headaches.  Psychiatric/Behavioral: Negative for suicidal ideas, confusion, sleep disturbance, dysphoric mood and agitation.         Objective:   Physical Exam  Constitutional: She appears well-developed. No distress.  obese  HENT:  Head: Normocephalic.  Right Ear: External ear normal.  Left Ear: External ear normal.  Nose: Nose normal.  Mouth/Throat: Oropharynx is clear and moist.  Eyes: Conjunctivae are normal. Pupils are equal, round, and reactive to light. Right eye exhibits no discharge. Left eye exhibits no discharge.  Neck: Normal range of motion. Neck supple. No JVD present. No tracheal deviation present. No thyromegaly present.  Cardiovascular: Normal rate, regular rhythm and normal heart sounds.   Pulmonary/Chest: No stridor. No respiratory distress. She has no wheezes.  Abdominal: Soft. Bowel sounds are normal. She exhibits no distension and no mass. There is no tenderness. There is no rebound and no guarding.  Musculoskeletal: She exhibits no edema and no tenderness.  Lymphadenopathy:    She has no cervical adenopathy.  Neurological: She displays normal reflexes. No cranial nerve deficit. She exhibits normal muscle tone. Coordination normal.  Skin: No rash noted. No erythema.  Psychiatric: She has a normal mood and affect. Her behavior is normal. Judgment  and thought content normal.    Lab Results  Component Value Date   WBC 6.2 05/27/2014   HGB 11.8* 05/27/2014   HCT 34.8* 05/27/2014   PLT 214.0 05/27/2014   GLUCOSE 99 05/27/2014   CHOL 179 05/27/2014    TRIG 133.0 05/27/2014   HDL 39.10 05/27/2014   LDLCALC 113* 05/27/2014   ALT 25 05/27/2014   AST 24 05/27/2014   NA 139 05/27/2014   K 3.6 05/27/2014   CL 107 05/27/2014   CREATININE 0.7 05/27/2014   BUN 9 05/27/2014   CO2 25 05/27/2014   TSH 1.21 05/27/2014   HGBA1C 5.8 05/27/2014   CA125 - stable     Assessment & Plan:

## 2014-06-13 ENCOUNTER — Ambulatory Visit (INDEPENDENT_AMBULATORY_CARE_PROVIDER_SITE_OTHER)
Admission: RE | Admit: 2014-06-13 | Discharge: 2014-06-13 | Disposition: A | Discharge status: Home / Self Care | Payer: Medicare Other | Source: Ambulatory Visit | Attending: Internal Medicine | Admitting: Internal Medicine

## 2014-06-13 DIAGNOSIS — D490 Neoplasm of unspecified behavior of digestive system: Secondary | ICD-10-CM | POA: Diagnosis not present

## 2014-06-13 DIAGNOSIS — K862 Cyst of pancreas: Secondary | ICD-10-CM | POA: Diagnosis not present

## 2014-06-13 DIAGNOSIS — R1032 Left lower quadrant pain: Secondary | ICD-10-CM

## 2014-06-13 MED ORDER — IOHEXOL 300 MG/ML  SOLN
100.0000 mL | Freq: Once | INTRAMUSCULAR | Status: AC | PRN
  Administered 2014-06-13: 100 mL via INTRAVENOUS

## 2014-06-16 ENCOUNTER — Inpatient Hospital Stay: Admission: RE | Admit: 2014-06-16 | Payer: Medicare Other | Source: Ambulatory Visit

## 2014-06-20 ENCOUNTER — Encounter: Payer: Self-pay | Admitting: Gastroenterology

## 2014-06-20 ENCOUNTER — Ambulatory Visit (INDEPENDENT_AMBULATORY_CARE_PROVIDER_SITE_OTHER): Payer: Medicare Other | Admitting: Internal Medicine

## 2014-06-20 ENCOUNTER — Telehealth: Payer: Self-pay | Admitting: Internal Medicine

## 2014-06-20 ENCOUNTER — Encounter: Payer: Self-pay | Admitting: Internal Medicine

## 2014-06-20 VITALS — BP 130/80 | HR 90 | Wt 182.0 lb

## 2014-06-20 DIAGNOSIS — K5900 Constipation, unspecified: Secondary | ICD-10-CM | POA: Insufficient documentation

## 2014-06-20 DIAGNOSIS — R1032 Left lower quadrant pain: Secondary | ICD-10-CM

## 2014-06-20 DIAGNOSIS — R1084 Generalized abdominal pain: Secondary | ICD-10-CM

## 2014-06-20 DIAGNOSIS — D490 Neoplasm of unspecified behavior of digestive system: Secondary | ICD-10-CM | POA: Diagnosis not present

## 2014-06-20 MED ORDER — PEG 3350-KCL-NA BICARB-NACL 420 G PO SOLR: 4000.0000 mL | Freq: Once | ORAL | Status: DC

## 2014-06-20 NOTE — Progress Notes (Signed)
Pre visit review using our clinic review tool, if applicable. No additional management support is needed unless otherwise documented below in the visit note. 

## 2014-06-20 NOTE — Telephone Encounter (Signed)
Keep refrigerated - it will be ok. Start w/1 cup 4 times a day Thx

## 2014-06-20 NOTE — Telephone Encounter (Signed)
Patient was prescribed something by Dr. Camila Li to clear her bowels.  She has questions on instructions.  She would like to know how often she needs to drink it and to notify you that what she picked up is only good for 24 hrs.  I told patient to contact pharmacy for instructions but she wanted me to send a message back to Plot.

## 2014-06-20 NOTE — Progress Notes (Signed)
   Subjective:     Abdominal Pain This is a new problem. The current episode started in the past 7 days (she ha some in [place]). The onset quality is gradual. The problem occurs 2 to 4 times per day. The problem has been gradually improving. The pain is located in the LLQ. The pain is moderate. The quality of the pain is dull. The abdominal pain radiates to the LLQ. Pertinent negatives include no frequency or nausea. The pain is aggravated by movement. She has tried acetaminophen for the symptoms. The treatment provided no relief. Her past medical history is significant for abdominal surgery.    F/u neuropathy, elev glu, elev BP, anxiety, osteoporhosis. She did not take Cymbalta, Glucophage. Doing well on diet... Boniva caused upset stomach. Took Prolia shot.  Wt Readings from Last 3 Encounters:  06/20/14 182 lb (82.555 kg)  06/10/14 187 lb 12.8 oz (85.186 kg)  05/27/14 186 lb (84.369 kg)   BP Readings from Last 3 Encounters:  06/20/14 130/80  06/10/14 124/80  05/27/14 140/74      Review of Systems  Constitutional: Negative for activity change, appetite change, fatigue and unexpected weight change.  HENT: Negative for congestion, mouth sores and sinus pressure.   Eyes: Negative for visual disturbance.  Respiratory: Negative for chest tightness.   Gastrointestinal: Negative for nausea.  Genitourinary: Negative for frequency, difficulty urinating and vaginal pain.  Musculoskeletal: Negative for back pain and gait problem.  Skin: Negative for pallor and rash.  Neurological: Positive for numbness. Negative for dizziness, tremors and weakness.  Psychiatric/Behavioral: Negative for suicidal ideas, confusion, sleep disturbance, dysphoric mood and agitation.         Objective:   Physical Exam  Constitutional: She appears well-developed. No distress.  obese  HENT:  Head: Normocephalic.  Right Ear: External ear normal.  Left Ear: External ear normal.  Nose: Nose normal.   Mouth/Throat: Oropharynx is clear and moist.  Eyes: Conjunctivae are normal. Pupils are equal, round, and reactive to light. Right eye exhibits no discharge. Left eye exhibits no discharge.  Neck: Normal range of motion. Neck supple. No JVD present. No tracheal deviation present. No thyromegaly present.  Cardiovascular: Normal rate, regular rhythm and normal heart sounds.   Pulmonary/Chest: No stridor. No respiratory distress. She has no wheezes.  Abdominal: Soft. Bowel sounds are normal. She exhibits no distension and no mass. There is no tenderness. There is no rebound and no guarding.  Musculoskeletal: She exhibits no edema and no tenderness.  Lymphadenopathy:    She has no cervical adenopathy.  Neurological: She displays normal reflexes. No cranial nerve deficit. She exhibits normal muscle tone. Coordination normal.  Skin: No rash noted. No erythema.  Psychiatric: She has a normal mood and affect. Her behavior is normal. Judgment and thought content normal.    Lab Results  Component Value Date   WBC 6.2 05/27/2014   HGB 11.8* 05/27/2014   HCT 34.8* 05/27/2014   PLT 214.0 05/27/2014   GLUCOSE 99 05/27/2014   CHOL 179 05/27/2014   TRIG 133.0 05/27/2014   HDL 39.10 05/27/2014   LDLCALC 113* 05/27/2014   ALT 25 05/27/2014   AST 24 05/27/2014   NA 139 05/27/2014   K 3.6 05/27/2014   CL 107 05/27/2014   CREATININE 0.7 05/27/2014   BUN 9 05/27/2014   CO2 25 05/27/2014   TSH 1.21 05/27/2014   HGBA1C 5.8 05/27/2014   CA125 - stable   CT abd Assessment & Plan:

## 2014-06-20 NOTE — Assessment & Plan Note (Signed)
10/15 severe on CT Will try [facility] ref Dr Olevia Perches

## 2014-06-23 NOTE — Telephone Encounter (Signed)
Patient states she has had some "stuff" come through her.  She does not wish to continue with this med.  She spoke with the pharmacy over the weekend and they told her to throw away after 24 hours.  She would like to discontinue use of this.  I told her I would get you to follow up with her.

## 2014-06-23 NOTE — Telephone Encounter (Signed)
[place] We can use Linzess 145 mcg 1-2 caps a day for a few days. Pls call in #60  [name] can try it Thx

## 2014-06-26 ENCOUNTER — Encounter: Payer: Self-pay | Admitting: *Deleted

## 2014-06-26 MED ORDER — LINACLOTIDE 145 MCG PO CAPS: 145.0000 ug | ORAL_CAPSULE | Freq: Every day | ORAL | Status: DC

## 2014-06-26 NOTE — Telephone Encounter (Signed)
Done. Pt informed.

## 2014-06-29 NOTE — Assessment & Plan Note (Signed)
A little better 

## 2014-06-29 NOTE — Assessment & Plan Note (Signed)
CT is w/o evidence of ca relapse

## 2014-07-02 ENCOUNTER — Other Ambulatory Visit (INDEPENDENT_AMBULATORY_CARE_PROVIDER_SITE_OTHER): Payer: Medicare Other

## 2014-07-02 ENCOUNTER — Ambulatory Visit (INDEPENDENT_AMBULATORY_CARE_PROVIDER_SITE_OTHER): Payer: Medicare Other | Admitting: Gastroenterology

## 2014-07-02 ENCOUNTER — Encounter: Payer: Self-pay | Admitting: Gastroenterology

## 2014-07-02 VITALS — BP 120/70 | HR 72 | Ht 63.5 in | Wt 181.4 lb

## 2014-07-02 DIAGNOSIS — R1032 Left lower quadrant pain: Secondary | ICD-10-CM

## 2014-07-02 DIAGNOSIS — K5900 Constipation, unspecified: Secondary | ICD-10-CM

## 2014-07-02 DIAGNOSIS — K589 Irritable bowel syndrome without diarrhea: Secondary | ICD-10-CM

## 2014-07-02 DIAGNOSIS — R1084 Generalized abdominal pain: Secondary | ICD-10-CM

## 2014-07-02 LAB — IGA: IGA: 300 mg/dL (ref 68–378)

## 2014-07-02 MED ORDER — HYOSCYAMINE SULFATE 0.125 MG SL SUBL: 0.1250 mg | SUBLINGUAL_TABLET | SUBLINGUAL | Status: DC | PRN

## 2014-07-02 NOTE — Patient Instructions (Signed)
You have a follow up visit scheduled with Dr. Olevia Perches for 08-19-2014 at 8:15 am If your symptoms get worse before your appointment please call the office   Your physician has requested that you go to the basement for the following lab work before leaving today: TTG IGA  Information on Fiber and Fiber Supplements given today   Please purchase Miralax over the counter and take one capful, mix in 8 ounces of juice or water drink once daily   We have sent the following medications to your pharmacy for you to pick up at your convenience: Levsin

## 2014-07-04 LAB — TISSUE TRANSGLUTAMINASE, IGA: Tissue Transglutaminase Ab, IgA: 11.2 U/mL (ref <20)

## 2014-07-07 DIAGNOSIS — R1032 Left lower quadrant pain: Secondary | ICD-10-CM | POA: Insufficient documentation

## 2014-07-07 NOTE — Progress Notes (Signed)
07/07/2014 [name] [id number] 12-21-1945   HISTORY OF PRESENT ILLNESS:  This is a 68 year old female who is previously known to Dr. Olevia Perches for colonoscopy in 04/2011 at which time she was found to have 3 polyps (tubular adenomas) that were removed and internal hemorrhoids.  She presents to our office today with complaints of alternating constipation and diarrhea, but predominantly constipation.  She was complaining of LLQ abdominal pain recently so CT scan of the abdomen and pelvis was ordered by her PCP that showed the following:  "IMPRESSION: 1. No acute abnormalities involving the abdomen or pelvis. 2. No evidence of recurrent or metastatic ovarian cancer. 3. Large stool burden throughout the colon. 4. Stable 7 mm cyst in the body of the pancreas with stable mildatrophy of the pancreatic body. This likely represents a benignintraductal mucinous lesion. 5. Urinary bladder extends very low in the perineum, query laxity of the pelvic floor muscles."  For the large stool burden she used some enemas; didn't have a BM right away but then had a lot of loose stools.  She has been using several different medications for her bowels.  Currently she is using Welchol 1250 mg BID, florastor BID, and Imodium prn for her bowels.  She was recently given Linzess by her PCP but she has not tried it yet.  She is frustrated with her alternating bowel issues.  The LLQ abdominal pain has been present in the past.  ? If affected by BM's.  She does have issues with back pain.  CBC, BMP, hepatic function panel, and TSH unremarkable.   Past Medical History  Diagnosis Date  . HTN (hypertension)   . LBP (low back pain)   . Menopause   . Vitamin B12 deficiency   . IBS (irritable bowel syndrome)   . Vitamin D deficiency   . GERD (gastroesophageal reflux disease)   . Ovarian cancer 09/2008    Dr Gwyneth Revels  . Glaucoma (increased eye pressure)   . Pancreatic cyst   . Adenomatous colon polyp    Past  Surgical History  Procedure Laterality Date  . Ovarian cancer debulking  09/2008  . Hemorrhoid surgery  2001    reports that she has never smoked. She has never used smokeless tobacco. She reports that she does not drink alcohol or use illicit drugs. family history includes Alzheimer's disease in her mother; Cancer (age of onset: 59) in her father; Lung cancer in her father; Mental illness (age of onset: 58) in her mother. Allergies  Allergen Reactions  . Actonel [Risedronate Sodium]     Upset stomach  . Boniva [Ibandronate Sodium]     cramp  . Calcium Channel Blockers     Upset stomach  . Compazine     Daughter reacts to compazine/pt does not want to take  . Lyrica [Pregabalin]     Dizzy       Outpatient Encounter Prescriptions as of 07/02/2014  Medication Sig  . aspirin 81 MG EC tablet Take 81 mg by mouth daily.    . bimatoprost (LUMIGAN) 0.03 % ophthalmic solution Place 1 drop into both eyes at bedtime.    . Cholecalciferol (VITAMIN D3 SUPER STRENGTH) 2000 UNITS TABS Take by mouth daily.   . cloNIDine (CATAPRES) 0.1 MG tablet Take 0.1 mg by mouth daily.  . colesevelam (WELCHOL) 625 MG tablet Take 2 tablets (1,250 mg total) by mouth 2 (two) times daily with a meal.  . denosumab (PROLIA) 60 MG/ML SOLN injection Inject  60 mg into the skin every 6 (six) months. Administer in upper arm, thigh, or abdomen  . Linaclotide (LINZESS) 145 MCG CAPS capsule Take 1-2 capsules (145-290 mcg total) by mouth daily.  [name] NASCOBAL 500 MCG/0.1ML SOLN Place 0.1 mLs (500 mcg total) into the nose once a week.  . RABEprazole (ACIPHEX) 20 MG tablet TAKE 1 TABLET DAILY  . timolol (TIMOPTIC-XR) 0.5 % ophthalmic gel-forming   . diphenoxylate-atropine (LOMOTIL) 2.5-0.025 MG per tablet Take 1 tablet by mouth 4 (four) times daily as needed for diarrhea or loose stools.  . hyoscyamine (LEVSIN SL) 0.125 MG SL tablet Place 1 tablet (0.125 mg total) under the tongue every 4 (four) hours as needed.  . saccharomyces  boulardii (FLORASTOR) 250 MG capsule Take 1 capsule (250 mg total) by mouth 2 (two) times daily.  . [DISCONTINUED] Potassium Gluconate 550 MG TABS Take by mouth. OTC 500 takes daily for 1 month  . [DISCONTINUED] traMADol (ULTRAM) 50 MG tablet Take 1-2 tablets (50-100 mg total) by mouth 2 (two) times daily as needed.     REVIEW OF SYSTEMS  : All other systems reviewed and negative except where noted in the History of Present Illness.   PHYSICAL EXAM: BP 120/70 mmHg  Pulse 72  Ht 5' 3.5" (1.613 m)  Wt 181 lb 6.4 oz (82.283 kg)  BMI 31.63 kg/m2 General: Well developed white female in no acute distress; appears anxious Head: Normocephalic and atraumatic Eyes:  Sclerae anicteric, conjunctiva pink. Ears: Normal auditory acuity Lungs: Clear throughout to auscultation Heart: Regular rate and rhythm Abdomen: Soft, non-distended.  BS present.  Mild LLQ abdominal TTP without R/R/G. Musculoskeletal: Symmetrical with no gross deformities  Skin: No lesions on visible extremities Extremities: No edema  Neurological: Alert oriented x 4, grossly non-focal Psychological:  Alert and cooperative. Normal mood and affect  ASSESSMENT AND PLAN: -Alternating constipation and diarrhea, but predominant constipation with some abdominal pain as well:  I suspect that she has IBS.  Symptoms are long-standing.  Having a hard time getting a balanced regimen.  I recommend that she start taking a daily fiber supplement and Miralax daily.  Continue Florastor.  Discontinue Welchol for now.  Can take Imodium prn for diarrhea.  Will try levsin prn for cramping.  Check celiac labs.  Follow-up with Dr. Olevia Perches in 4-6 weeks. -LLQ abdominal pain:  ? If related to bowel issues but also possible that this is from scar tissue or musculoskeletal pain, possibly referred from her back.

## 2014-07-09 DIAGNOSIS — K589 Irritable bowel syndrome without diarrhea: Secondary | ICD-10-CM | POA: Insufficient documentation

## 2014-07-09 NOTE — Progress Notes (Signed)
Reviewed and agree. ? IBS.

## 2014-07-18 ENCOUNTER — Ambulatory Visit: Payer: Medicare Other | Admitting: Internal Medicine

## 2014-08-07 ENCOUNTER — Other Ambulatory Visit: Payer: Medicare Other

## 2014-08-07 DIAGNOSIS — C5700 Malignant neoplasm of unspecified fallopian tube: Secondary | ICD-10-CM | POA: Diagnosis not present

## 2014-08-08 ENCOUNTER — Other Ambulatory Visit: Payer: Self-pay | Admitting: Internal Medicine

## 2014-08-08 DIAGNOSIS — H2513 Age-related nuclear cataract, bilateral: Secondary | ICD-10-CM | POA: Diagnosis not present

## 2014-08-08 DIAGNOSIS — H52203 Unspecified astigmatism, bilateral: Secondary | ICD-10-CM | POA: Diagnosis not present

## 2014-08-08 DIAGNOSIS — H01001 Unspecified blepharitis right upper eyelid: Secondary | ICD-10-CM | POA: Diagnosis not present

## 2014-08-08 DIAGNOSIS — H40053 Ocular hypertension, bilateral: Secondary | ICD-10-CM | POA: Diagnosis not present

## 2014-08-08 LAB — CA 125(PREVIOUS METHOD): CA 125: 8 U/mL (ref 0.0–30.2)

## 2014-08-08 LAB — CA 125: CA 125: 10 U/mL (ref <35)

## 2014-08-11 ENCOUNTER — Telehealth: Payer: Self-pay | Admitting: Oncology

## 2014-08-11 ENCOUNTER — Encounter: Payer: Self-pay | Admitting: Gynecology

## 2014-08-11 ENCOUNTER — Ambulatory Visit: Payer: Medicare Other | Attending: Gynecology | Admitting: Gynecology

## 2014-08-11 VITALS — BP 151/84 | HR 78 | Temp 97.5°F | Resp 20 | Ht 63.5 in | Wt 183.7 lb

## 2014-08-11 DIAGNOSIS — K219 Gastro-esophageal reflux disease without esophagitis: Secondary | ICD-10-CM | POA: Diagnosis not present

## 2014-08-11 DIAGNOSIS — N816 Rectocele: Secondary | ICD-10-CM | POA: Insufficient documentation

## 2014-08-11 DIAGNOSIS — I10 Essential (primary) hypertension: Secondary | ICD-10-CM | POA: Insufficient documentation

## 2014-08-11 DIAGNOSIS — E559 Vitamin D deficiency, unspecified: Secondary | ICD-10-CM | POA: Insufficient documentation

## 2014-08-11 DIAGNOSIS — Z8544 Personal history of malignant neoplasm of other female genital organs: Secondary | ICD-10-CM | POA: Diagnosis not present

## 2014-08-11 DIAGNOSIS — Z79899 Other long term (current) drug therapy: Secondary | ICD-10-CM | POA: Diagnosis not present

## 2014-08-11 DIAGNOSIS — C5700 Malignant neoplasm of unspecified fallopian tube: Secondary | ICD-10-CM | POA: Insufficient documentation

## 2014-08-11 DIAGNOSIS — Z9221 Personal history of antineoplastic chemotherapy: Secondary | ICD-10-CM | POA: Insufficient documentation

## 2014-08-11 DIAGNOSIS — N8110 Cystocele, unspecified: Secondary | ICD-10-CM

## 2014-08-11 DIAGNOSIS — Z7982 Long term (current) use of aspirin: Secondary | ICD-10-CM | POA: Diagnosis not present

## 2014-08-11 DIAGNOSIS — K589 Irritable bowel syndrome without diarrhea: Secondary | ICD-10-CM | POA: Insufficient documentation

## 2014-08-11 DIAGNOSIS — IMO0002 Reserved for concepts with insufficient information to code with codable children: Secondary | ICD-10-CM

## 2014-08-11 DIAGNOSIS — E538 Deficiency of other specified B group vitamins: Secondary | ICD-10-CM | POA: Diagnosis not present

## 2014-08-11 NOTE — Progress Notes (Signed)
Consult Note: Gyn-Onc   THOMAS RHUDE 68 y.o. female  No chief complaint on file.  Assessment: Stage III fallopian tube cancer 2010. Clinically free of disease.  Mildly symptomatic cystocele and rectocele.  Plan the patient will see Dr. Marko Plume in 6 months and return to see [place] in one year. We'll continue to monitor CA 125 values.  Interval History Since her last visit the patient has done well. She has had difficulties over the past several months with diarrhea followed by constipation. She had a CT scan on 06/13/2014 which essentially negative. She does note some additional an increased bulging in her vagina which is uncomfortable. She is scheduled to see Dr. Olevia Perches next week for further evaluation of GI symptoms. Is noted that her recent CA-125 was 10 units per mL (previously 8 units per mL). Otherwise she has no other GI or GU symptoms. Functional status is excellent. She denies any breast symptoms. She scheduled to have mammograms next month.   HPI: Stage III fallopian tube carcinoma undergoing initial surgical debulking February 2010. She was optimally debulked with minimal disease on peritoneal surfaces. She received 6 cycles of carboplatin and Taxol chemotherapy completed in June 2010. He CT scan and a PET scan at that time showed no evidence disease. The patient has been followed since that time with normal exams and CA 125 values.    Review of Systems:10 point review of systems is negative as noted above.   Vitals: Blood pressure 151/84, pulse 78, temperature 97.5 F (36.4 C), temperature source Oral, height 5' 3.5" (1.613 m), weight 183 lb 11.2 oz (83.326 kg).  Physical Exam: General : The patient is a healthy woman in no acute distress.  HEENT: normocephalic, extraoccular movements normal; neck is supple without thyromegally  Lynphnodes: Supraclavicular, axillary and inguinal nodes not enlarged   Breasts are without masses skin changes or nipple discharge. Abdomen: Soft,  non-tender, no ascites, no organomegally, no masses, no hernias  Pelvic:  EGBUS: Normal female  Vagina: Patient has a grade 1-2 cystocele and rectocele. Urethra and Bladder: Normal, non-tender, cystocele.  Cervix: Surgically absent  Uterus: Surgically absent  Bi-manual examination: Non-tender; no adenxal masses or nodularity  Rectal: normal sphincter tone, no masses, no blood  Lower extremities: No edema or varicosities. Normal range of motion     Allergies  Allergen Reactions  . Actonel [Risedronate Sodium]     Upset stomach  . Boniva [Ibandronate Sodium]     cramp  . Calcium Channel Blockers     Upset stomach  . Compazine     Daughter reacts to compazine/pt does not want to take  . Lyrica [Pregabalin]     Dizzy     Past Medical History  Diagnosis Date  . HTN (hypertension)   . LBP (low back pain)   . Menopause   . Vitamin B12 deficiency   . IBS (irritable bowel syndrome)   . Vitamin D deficiency   . GERD (gastroesophageal reflux disease)   . Ovarian cancer 09/2008    Dr Gwyneth Revels  . Glaucoma (increased eye pressure)   . Pancreatic cyst   . Adenomatous colon polyp     Past Surgical History  Procedure Laterality Date  . Ovarian cancer debulking  09/2008  . Hemorrhoid surgery  2001    Current Outpatient Prescriptions  Medication Sig Dispense Refill  . aspirin 81 MG EC tablet Take 81 mg by mouth daily.      . bimatoprost (LUMIGAN) 0.03 % ophthalmic solution Place 1 drop  into both eyes at bedtime.      . Cholecalciferol (VITAMIN D3 SUPER STRENGTH) 2000 UNITS TABS Take by mouth daily.     . cloNIDine (CATAPRES) 0.1 MG tablet Take 0.1 mg by mouth daily.    . colesevelam (WELCHOL) 625 MG tablet Take 2 tablets (1,250 mg total) by mouth 2 (two) times daily with a meal. 360 tablet 3  . denosumab (PROLIA) 60 MG/ML SOLN injection Inject 60 mg into the skin every 6 (six) months. Administer in upper arm, thigh, or abdomen    . diphenoxylate-atropine (LOMOTIL) 2.5-0.025 MG per  tablet Take 1 tablet by mouth 4 (four) times daily as needed for diarrhea or loose stools. 60 tablet 1  . hyoscyamine (LEVSIN SL) 0.125 MG SL tablet Place 1 tablet (0.125 mg total) under the tongue every 4 (four) hours as needed. 30 tablet 0  . Linaclotide (LINZESS) 145 MCG CAPS capsule Take 1-2 capsules (145-290 mcg total) by mouth daily. 60 capsule 0  . NASCOBAL 500 MCG/0.1ML SOLN USE 1 SPRAY (0.1 ML/500 MCG TOTAL) INTO THE NOSE ONCE A WEEK 3 Bottle 3  . RABEprazole (ACIPHEX) 20 MG tablet TAKE 1 TABLET DAILY 90 tablet 4  . saccharomyces boulardii (FLORASTOR) 250 MG capsule Take 1 capsule (250 mg total) by mouth 2 (two) times daily. 60 capsule 1  . timolol (TIMOPTIC-XR) 0.5 % ophthalmic gel-forming      No current facility-administered medications for this visit.    History   Social History  . Marital Status: Married    Spouse Name: N/A    Number of Children: 2  . Years of Education: N/A   Occupational History  . Homemaker    Social History Main Topics  . Smoking status: Never Smoker   . Smokeless tobacco: Never Used  . Alcohol Use: No  . Drug Use: No  . Sexual Activity: Not on file   Other Topics Concern  . Not on file   Social History Narrative    Family History  Problem Relation Age of Onset  . Alzheimer's disease Mother   . Mental illness Mother 73    Alzheimer's  . Cancer Father 54    lung ca  . Lung cancer Father       Alvino Chapel, MD 08/11/2014, 10:42 AM

## 2014-08-11 NOTE — Patient Instructions (Signed)
See Dr. Marko Plume in 6 months and Dr. Fermin Schwab in 1 year. Please call us sooner with any questions or concerns. Happy Holidays!

## 2014-08-11 NOTE — Telephone Encounter (Signed)
per pof to sch pt appt w/LL-adv pt LL sch not open for 02/2015-adv we would call with aoppt-pt undertood

## 2014-08-19 ENCOUNTER — Encounter: Payer: Self-pay | Admitting: Internal Medicine

## 2014-08-19 ENCOUNTER — Ambulatory Visit (INDEPENDENT_AMBULATORY_CARE_PROVIDER_SITE_OTHER): Payer: Medicare Other | Admitting: Internal Medicine

## 2014-08-19 VITALS — BP 112/60 | HR 80 | Ht 63.5 in | Wt 184.4 lb

## 2014-08-19 DIAGNOSIS — K589 Irritable bowel syndrome without diarrhea: Secondary | ICD-10-CM

## 2014-08-19 DIAGNOSIS — R1032 Left lower quadrant pain: Secondary | ICD-10-CM

## 2014-08-19 NOTE — Progress Notes (Signed)
[name] 03/14/1946 [id number]  Note: This dictation was prepared with Dragon digital system. Any transcriptional errors that result from this procedure are unintentional.   History of Present Illness:  This is a 68 year old white female who returns for follow-up of constipation as well as diarrhea. She saw J.[name], PA-C  Two  months ago and was started on fiber supplements and Miralax. She is currently  on Benefiber 2 teaspoons daily and feels much improved. Her left lower quadrant abdominal pain has subsided. She attributes the pain to Prolia injections which she takes for osteoporosis every 6 months. Her last injection was in July 2015. A CT scan of the abdomen showed increased stool as well as a 7 mm cyst in the body of the pancreas,  And descended bladder consistent with pelvic floor relaxation. She has a history of stage IIIC serous fallopian tube cancer in 2010 followed by Dr. Marko Plume. She underwent a debulking procedure, TAH and BSO as well as omentectomy ( adhesions of the omentum to the abdominal wall)  and chemotherapy  6 cycles. Her last colonoscopy in August 2012 revealed 3 tubular adenomas and hemorrhoids. She has been complaining of left lower quadrant abdominal pain in 2 discrete episodes. One in March 2018 and one in October 2015, she relates the timing of its to the injection of Prolia.     Past Medical History  Diagnosis Date  . HTN (hypertension)   . LBP (low back pain)   . Menopause   . Vitamin B12 deficiency   . IBS (irritable bowel syndrome)   . Vitamin D deficiency   . GERD (gastroesophageal reflux disease)   . Ovarian cancer 09/2008    Dr Gwyneth Revels  . Glaucoma (increased eye pressure)   . Pancreatic cyst   . Adenomatous colon polyp     Past Surgical History  Procedure Laterality Date  . Ovarian cancer debulking  09/2008  . Hemorrhoid surgery  2001    Allergies  Allergen Reactions  . Actonel [Risedronate Sodium]     Upset stomach  . Boniva [Ibandronate  Sodium]     cramp  . Calcium Channel Blockers     Upset stomach  . Compazine     Daughter reacts to compazine/pt does not want to take  . Lyrica [Pregabalin]     Dizzy     Family history and social history have been reviewed.  Review of Systems:   The remainder of the 10 point ROS is negative except as outlined in the H&P  Physical Exam: General Appearance Well developed, in no distress Eyes  Non icteric  HEENT  Non traumatic, normocephalic  Mouth No lesion, tongue papillated, no cheilosis Neck Supple without adenopathy, thyroid not enlarged, no carotid bruits, no JVD Lungs Clear to auscultation bilaterally COR Normal S1, normal S2, regular rhythm, no murmur, quiet precordium Abdomen Soft minimally tender in left lower quadrant. Well-healed surgical scar. Liver edge at costal margin  Rectal External hemorrhoidal tags. Normal rectal sphincter tone. Small amount of Hemoccult negative stool  Extremities  No pedal edema Skin No lesions Neurological Alert and oriented x 3 Psychological Normal mood and affect  Assessment and Plan:   Problem #56 68 year old white female who had a  stage IIIC carcinoma of the left fallopian tube for which she underwent  TAH and BSO as well as omentectomy. She is having irregular bowel habits. She is currently improved on Benefiber 2 teaspoons daily. Her stools are still not back to normal. I have asked her to  increase Benefiber to 3 teaspoons a day and continue probiotics. She will stay off WelChol. She will use antispasmodics on an as necessary basis. She wants to delay her next injection of Prolia to see if she can prevent another flareup of left lower quadrant abdominal pain. We discussed the possibility of a repeat colonoscopy.If pain continues     Delfin Edis 08/19/2014

## 2014-08-19 NOTE — Patient Instructions (Addendum)
Follow up as directed.  CC:Dr [name]

## 2014-08-26 ENCOUNTER — Encounter: Payer: Medicare Other | Admitting: Internal Medicine

## 2014-10-24 ENCOUNTER — Telehealth: Payer: Self-pay | Admitting: Oncology

## 2014-11-12 ENCOUNTER — Encounter: Payer: Self-pay | Admitting: Internal Medicine

## 2014-11-12 ENCOUNTER — Ambulatory Visit (INDEPENDENT_AMBULATORY_CARE_PROVIDER_SITE_OTHER)
Admission: RE | Admit: 2014-11-12 | Discharge: 2014-11-12 | Disposition: A | Discharge status: Home / Self Care | Payer: Medicare Other | Source: Ambulatory Visit | Attending: Internal Medicine | Admitting: Internal Medicine

## 2014-11-12 ENCOUNTER — Other Ambulatory Visit: Payer: Self-pay | Admitting: Internal Medicine

## 2014-11-12 ENCOUNTER — Ambulatory Visit (INDEPENDENT_AMBULATORY_CARE_PROVIDER_SITE_OTHER): Payer: Medicare Other | Admitting: Internal Medicine

## 2014-11-12 ENCOUNTER — Other Ambulatory Visit (INDEPENDENT_AMBULATORY_CARE_PROVIDER_SITE_OTHER): Payer: Medicare Other

## 2014-11-12 VITALS — BP 156/80 | HR 71 | Temp 98.5°F | Wt 183.5 lb

## 2014-11-12 DIAGNOSIS — E538 Deficiency of other specified B group vitamins: Secondary | ICD-10-CM

## 2014-11-12 DIAGNOSIS — E559 Vitamin D deficiency, unspecified: Secondary | ICD-10-CM | POA: Diagnosis not present

## 2014-11-12 DIAGNOSIS — R197 Diarrhea, unspecified: Secondary | ICD-10-CM | POA: Diagnosis not present

## 2014-11-12 DIAGNOSIS — I10 Essential (primary) hypertension: Secondary | ICD-10-CM | POA: Diagnosis not present

## 2014-11-12 DIAGNOSIS — R202 Paresthesia of skin: Secondary | ICD-10-CM

## 2014-11-12 DIAGNOSIS — K219 Gastro-esophageal reflux disease without esophagitis: Secondary | ICD-10-CM | POA: Diagnosis not present

## 2014-11-12 DIAGNOSIS — Z0000 Encounter for general adult medical examination without abnormal findings: Secondary | ICD-10-CM | POA: Diagnosis not present

## 2014-11-12 DIAGNOSIS — C5700 Malignant neoplasm of unspecified fallopian tube: Secondary | ICD-10-CM

## 2014-11-12 LAB — LIPID PANEL
CHOLESTEROL: 171 mg/dL (ref 0–200)
HDL: 47 mg/dL (ref >39.00)
LDL CALC: 96 mg/dL (ref 0–99)
NONHDL: 124
Total CHOL/HDL Ratio: 4
Triglycerides: 141 mg/dL (ref 0.0–149.0)
VLDL: 28.2 mg/dL (ref 0.0–40.0)

## 2014-11-12 LAB — CBC WITH DIFFERENTIAL/PLATELET
Basophils Absolute: 0 10*3/uL (ref 0.0–0.1)
Basophils Relative: 0.5 % (ref 0.0–3.0)
Eosinophils Absolute: 0.1 10*3/uL (ref 0.0–0.7)
Eosinophils Relative: 1.7 % (ref 0.0–5.0)
HEMATOCRIT: 34.6 % — AB (ref 36.0–46.0)
HEMOGLOBIN: 12.1 g/dL (ref 12.0–15.0)
LYMPHS ABS: 1.3 10*3/uL (ref 0.7–4.0)
LYMPHS PCT: 24.1 % (ref 12.0–46.0)
MCHC: 35 g/dL (ref 30.0–36.0)
MCV: 84.4 fl (ref 78.0–100.0)
MONOS PCT: 8.3 % (ref 3.0–12.0)
Monocytes Absolute: 0.5 10*3/uL (ref 0.1–1.0)
Neutro Abs: 3.5 10*3/uL (ref 1.4–7.7)
Neutrophils Relative %: 65.4 % (ref 43.0–77.0)
Platelets: 219 10*3/uL (ref 150.0–400.0)
RBC: 4.1 Mil/uL (ref 3.87–5.11)
RDW: 13.1 % (ref 11.5–15.5)
WBC: 5.4 10*3/uL (ref 4.0–10.5)

## 2014-11-12 LAB — BASIC METABOLIC PANEL
BUN: 16 mg/dL (ref 6–23)
CO2: 29 mEq/L (ref 19–32)
CREATININE: 0.78 mg/dL (ref 0.40–1.20)
Calcium: 9.5 mg/dL (ref 8.4–10.5)
Chloride: 105 mEq/L (ref 96–112)
GFR: 77.85 mL/min (ref >60.00)
GLUCOSE: 102 mg/dL — AB (ref 70–99)
Potassium: 3.9 mEq/L (ref 3.5–5.1)
Sodium: 140 mEq/L (ref 135–145)

## 2014-11-12 LAB — HEPATIC FUNCTION PANEL
ALT: 18 U/L (ref 0–35)
AST: 17 U/L (ref 0–37)
Albumin: 4.3 g/dL (ref 3.5–5.2)
Alkaline Phosphatase: 74 U/L (ref 39–117)
Bilirubin, Direct: 0.1 mg/dL (ref 0.0–0.3)
TOTAL PROTEIN: 7.7 g/dL (ref 6.0–8.3)
Total Bilirubin: 0.7 mg/dL (ref 0.2–1.2)

## 2014-11-12 LAB — URINALYSIS
Bilirubin Urine: NEGATIVE
KETONES UR: NEGATIVE
Leukocytes, UA: NEGATIVE
Nitrite: NEGATIVE
Specific Gravity, Urine: 1.025 (ref 1.000–1.030)
Total Protein, Urine: NEGATIVE
Urine Glucose: NEGATIVE
Urobilinogen, UA: 0.2 (ref 0.0–1.0)
pH: 6 (ref 5.0–8.0)

## 2014-11-12 LAB — VITAMIN D 25 HYDROXY (VIT D DEFICIENCY, FRACTURES): VITD: 21.01 ng/mL — AB (ref 30.00–100.00)

## 2014-11-12 LAB — SEDIMENTATION RATE: Sed Rate: 48 mm/hr — ABNORMAL HIGH (ref 0–22)

## 2014-11-12 LAB — VITAMIN B12: VITAMIN B 12: 673 pg/mL (ref 211–911)

## 2014-11-12 LAB — TSH: TSH: 1.55 u[IU]/mL (ref 0.35–4.50)

## 2014-11-12 MED ORDER — METRONIDAZOLE 500 MG PO TABS: 500.0000 mg | ORAL_TABLET | Freq: Three times a day (TID) | ORAL | Status: DC

## 2014-11-12 MED ORDER — SACCHAROMYCES BOULARDII 250 MG PO CAPS: 250.0000 mg | ORAL_CAPSULE | Freq: Two times a day (BID) | ORAL | Status: DC

## 2014-11-12 MED ORDER — ERGOCALCIFEROL 1.25 MG (50000 UT) PO CAPS: 50000.0000 [IU] | ORAL_CAPSULE | ORAL | Status: DC

## 2014-11-12 NOTE — Assessment & Plan Note (Signed)
Labs Vit D po

## 2014-11-12 NOTE — Progress Notes (Signed)
   Subjective:     HPI  The patient is here for a wellness exam.   F/u neuropathy, elev glu, elev BP, anxiety, osteoporhosis. She did not take Cymbalta, Glucophage. Doing well on diet... Boniva caused upset stomach - stopped. Took Prolia shot x1  Wt Readings from Last 3 Encounters:  11/12/14 183 lb 8 oz (83.235 kg)  08/19/14 184 lb 6.4 oz (83.643 kg)  08/11/14 183 lb 11.2 oz (83.326 kg)   BP Readings from Last 3 Encounters:  11/12/14 156/80  08/19/14 112/60  08/11/14 151/84      Review of Systems  Constitutional: Negative for activity change, appetite change, fatigue and unexpected weight change.  HENT: Negative for congestion, mouth sores and sinus pressure.   Eyes: Negative for visual disturbance.  Respiratory: Negative for chest tightness.   Genitourinary: Negative for difficulty urinating and vaginal pain.  Musculoskeletal: Negative for back pain and gait problem.  Skin: Negative for pallor and rash.  Neurological: Positive for numbness. Negative for dizziness, tremors and weakness.  Psychiatric/Behavioral: Negative for suicidal ideas, confusion, sleep disturbance, dysphoric mood and agitation.         Objective:   Physical Exam  Constitutional: She appears well-developed. No distress.  obese  HENT:  Head: Normocephalic.  Right Ear: External ear normal.  Left Ear: External ear normal.  Nose: Nose normal.  Mouth/Throat: Oropharynx is clear and moist.  Eyes: Conjunctivae are normal. Pupils are equal, round, and reactive to light. Right eye exhibits no discharge. Left eye exhibits no discharge.  Neck: Normal range of motion. Neck supple. No JVD present. No tracheal deviation present. No thyromegaly present.  Cardiovascular: Normal rate, regular rhythm and normal heart sounds.   Pulmonary/Chest: No stridor. No respiratory distress. She has no wheezes.  Abdominal: Soft. Bowel sounds are normal. She exhibits no distension and no mass. There is no tenderness. There  is no rebound and no guarding.  Musculoskeletal: She exhibits no edema or tenderness.  Lymphadenopathy:    She has no cervical adenopathy.  Neurological: She displays normal reflexes. No cranial nerve deficit. She exhibits normal muscle tone. Coordination normal.  Skin: No rash noted. No erythema.  Psychiatric: She has a normal mood and affect. Her behavior is normal. Judgment and thought content normal.    Lab Results  Component Value Date   WBC 6.2 05/27/2014   HGB 11.8* 05/27/2014   HCT 34.8* 05/27/2014   PLT 214.0 05/27/2014   GLUCOSE 99 05/27/2014   CHOL 179 05/27/2014   TRIG 133.0 05/27/2014   HDL 39.10 05/27/2014   LDLCALC 113* 05/27/2014   ALT 25 05/27/2014   AST 24 05/27/2014   NA 139 05/27/2014   K 3.6 05/27/2014   CL 107 05/27/2014   CREATININE 0.7 05/27/2014   BUN 9 05/27/2014   CO2 25 05/27/2014   TSH 1.21 05/27/2014   HGBA1C 5.8 05/27/2014   CA125 - stable EKG ok  CT abd   Assessment & Plan:  Patient ID: FOREST PRUDEN, female   DOB: 07-11-1946, 69 y.o.   MRN: [id number]

## 2014-11-12 NOTE — Assessment & Plan Note (Signed)
Cont w/Vit B12 

## 2014-11-12 NOTE — Assessment & Plan Note (Signed)
BP Readings from Last 3 Encounters:  11/12/14 156/80  08/19/14 112/60  08/11/14 151/84   On Catapress

## 2014-11-12 NOTE — Assessment & Plan Note (Addendum)
Hold Miralax, Metamucil Avoid milk x 2 wks Imodium prn Trial of po Flagyl Rx x 1 wk followed by Florastor x1 mo

## 2014-11-12 NOTE — Assessment & Plan Note (Signed)
CXR EKG Labs

## 2014-11-12 NOTE — Assessment & Plan Note (Signed)
Better B12

## 2014-11-12 NOTE — Assessment & Plan Note (Signed)
On Acipphex

## 2014-11-12 NOTE — Progress Notes (Signed)
Pre visit review using our clinic review tool, if applicable. No additional management support is needed unless otherwise documented below in the visit note. 

## 2014-11-20 ENCOUNTER — Telehealth: Payer: Self-pay | Admitting: Internal Medicine

## 2014-11-20 NOTE — Telephone Encounter (Signed)
Pt called stated her dentis suppose to be sending something [name] stating that it is okey for the pt to get the prolia injection because Dr. Camila Li ask her to check with her dentis before we schedule or order prolia. Please call pt

## 2014-11-24 NOTE — Telephone Encounter (Signed)
Pt informed

## 2014-11-24 NOTE — Telephone Encounter (Signed)
Will have Deirdre Peer verify Prolia benefits and arrange Prolia.

## 2014-11-26 ENCOUNTER — Other Ambulatory Visit: Payer: Self-pay

## 2014-11-26 DIAGNOSIS — Z1231 Encounter for screening mammogram for malignant neoplasm of breast: Secondary | ICD-10-CM

## 2014-11-27 ENCOUNTER — Telehealth: Payer: Self-pay | Admitting: Internal Medicine

## 2014-11-27 NOTE — Telephone Encounter (Signed)
I have sent pt's info for Prolia insurance verification and will notify you once I have a response. Thank you. °

## 2014-12-02 NOTE — Telephone Encounter (Signed)
I had to resubmit info for [name] verification b/c I was not aware that pt has a secondary insurance in effect. I have also added the secondary insurance in her registration. Thank you.

## 2014-12-05 NOTE — Telephone Encounter (Signed)
Pt informed and appt scheduled.

## 2014-12-05 NOTE — Telephone Encounter (Signed)
I have rec'd pt's insurance verification for Prolia.  Ms. [name] primary insurance, Medicare will cover 80% of the injection, leaving her w/a 20% co-insurance (approximately $180) once $166 deductible is met (she has met $0 of this deductible).  Her secondary insurance has a $400 deductible, which she has met $0 as well. That leaves Ms. [name] w/an estimated responsibility of $170.  Please let her know this is an estimate and we will not know an exact amt until both insurances have responded.  I have sent a copy of the summary of benefits to be scanned into her chart.  If Ms. [name] cannot afford $170 for her injection, please advise her to contact Prolia at [phone] to see if she qualifies for one of their financial assistance programs.  If she qualifies they will instruct her how to proceed.  Please let me know if you have any questions. Thank you.

## 2014-12-22 ENCOUNTER — Ambulatory Visit (INDEPENDENT_AMBULATORY_CARE_PROVIDER_SITE_OTHER)
Admission: RE | Admit: 2014-12-22 | Discharge: 2014-12-22 | Disposition: A | Discharge status: Home / Self Care | Payer: Medicare Other | Source: Ambulatory Visit | Attending: Family | Admitting: Family

## 2014-12-22 ENCOUNTER — Telehealth: Payer: Self-pay | Admitting: Internal Medicine

## 2014-12-22 ENCOUNTER — Ambulatory Visit (INDEPENDENT_AMBULATORY_CARE_PROVIDER_SITE_OTHER): Payer: Medicare Other | Admitting: Family

## 2014-12-22 ENCOUNTER — Encounter: Payer: Self-pay | Admitting: Family

## 2014-12-22 ENCOUNTER — Other Ambulatory Visit: Payer: Medicare Other

## 2014-12-22 VITALS — BP 142/90 | HR 79 | Temp 98.0°F | Resp 18 | Ht 63.5 in | Wt 189.1 lb

## 2014-12-22 DIAGNOSIS — R350 Frequency of micturition: Secondary | ICD-10-CM | POA: Diagnosis not present

## 2014-12-22 DIAGNOSIS — R109 Unspecified abdominal pain: Secondary | ICD-10-CM | POA: Diagnosis not present

## 2014-12-22 DIAGNOSIS — N200 Calculus of kidney: Secondary | ICD-10-CM | POA: Diagnosis not present

## 2014-12-22 LAB — POCT URINALYSIS DIPSTICK
Bilirubin, UA: NEGATIVE
Glucose, UA: NEGATIVE
Ketones, UA: NEGATIVE
LEUKOCYTES UA: NEGATIVE
Nitrite, UA: NEGATIVE
PH UA: 6
Spec Grav, UA: 1.02
UROBILINOGEN UA: NEGATIVE

## 2014-12-22 MED ORDER — CIPROFLOXACIN HCL 250 MG PO TABS: 250.0000 mg | ORAL_TABLET | Freq: Two times a day (BID) | ORAL | Status: DC

## 2014-12-22 NOTE — Patient Instructions (Signed)
Thank you for choosing [facility].  Summary/Instructions:  Your prescription(s) have been submitted to your pharmacy or been printed and provided for you. Please take as directed and contact our office if you believe you are having problem(s) with the medication(s) or have any questions.  If your symptoms worsen or fail to improve, please contact our office for further instruction, or in case of emergency go directly to the emergency room at the closest medical facility.    Flank Pain Flank pain refers to pain that is located on the side of the body between the upper abdomen and the back. The pain may occur over a short period of time (acute) or may be long-term or reoccurring (chronic). It may be mild or severe. Flank pain can be caused by many things. CAUSES  Some of the more common causes of flank pain include:  Muscle strains.   Muscle spasms.   A disease of your spine (vertebral disk disease).   A lung infection (pneumonia).   Fluid around your lungs (pulmonary edema).   A kidney infection.   Kidney stones.   A very painful skin rash caused by the chickenpox virus (shingles).   Gallbladder disease.  [place] care will depend on the cause of your pain. In general,  Rest as directed by your caregiver.  Drink enough fluids to keep your urine clear or pale yellow.  Only take over-the-counter or prescription medicines as directed by your caregiver. Some medicines may help relieve the pain.  Tell your caregiver about any changes in your pain.  Follow up with your caregiver as directed. SEEK IMMEDIATE MEDICAL CARE IF:   Your pain is not controlled with medicine.   You have new or worsening symptoms.  Your pain increases.   You have abdominal pain.   You have shortness of breath.   You have persistent nausea or vomiting.   You have swelling in your abdomen.   You feel faint or pass out.   You have blood in your  urine.  You have a fever or persistent symptoms for more than 2-3 days.  You have a fever and your symptoms suddenly get worse. MAKE SURE YOU:   Understand these instructions.  Will watch your condition.  Will get help right away if you are not doing well or get worse. Document Released: 10/13/2005 Document Revised: 05/16/2012 Document Reviewed: 04/05/2012 [facility] Patient Information 2015 [place], [place]. This information is not intended to replace advice given to you by your health care provider. Make sure you discuss any questions you have with your health care provider.

## 2014-12-22 NOTE — Assessment & Plan Note (Addendum)
Symptoms and exam consistent with potential urinary tract infection, however cannot rule out a kidney stone. Obtain renal CT to rule out kidney stone. In office UA shows no leukocytes or nitrites but is positive for hematuria. Given symptoms, start Cipro to cover for UTI pending CT results. Patient instructed to go to the Emergency Room or Urgent Care if symptoms worsen.

## 2014-12-22 NOTE — Progress Notes (Signed)
Pre visit review using our clinic review tool, if applicable. No additional management support is needed unless otherwise documented below in the visit note. 

## 2014-12-22 NOTE — Progress Notes (Signed)
Subjective:    Patient ID: [name], female    DOB: May 12, 1946, 69 y.o.   MRN: [id number]  Chief Complaint  Patient presents with  . Flank Pain    pain started at 1am this morning and lasted for the rest of the night in her right side starting in lower back and moving to the front lower abdomen, did feel some pain in vaginal area last night while taking a bath last night, having urinary frequency    HPI:  [name] is a 69 y.o. female who presents today for an acute visit.  This is a new problem. Associated symptoms of flank pain, urinary frequency and urinary urgency have been going on since early this morning. Indicates she does have some pain in her lower abdomen and vaginal area. Pain is described as dull and achy with occasional sharp pain with a severity of 10/10 which has now subsided with modifying factor of time. It continues to wax and wane.  Allergies  Allergen Reactions  . Actonel [Risedronate Sodium]     Upset stomach  . Boniva [Ibandronate Sodium]     cramp  . Calcium Channel Blockers     Upset stomach  . Compazine     Daughter reacts to compazine/pt does not want to take  . Lyrica [Pregabalin]     Dizzy     Current Outpatient Prescriptions on File Prior to Visit  Medication Sig Dispense Refill  . aspirin 81 MG EC tablet Take 81 mg by mouth daily.      . bimatoprost (LUMIGAN) 0.03 % ophthalmic solution Place 1 drop into both eyes at bedtime.      . Cholecalciferol (VITAMIN D3 SUPER STRENGTH) 2000 UNITS TABS Take by mouth daily.     . cloNIDine (CATAPRES) 0.1 MG tablet Take 0.1 mg by mouth daily.    [name] denosumab (PROLIA) 60 MG/ML SOLN injection Inject 60 mg into the skin every 6 (six) months. Administer in upper arm, thigh, or abdomen    . diphenoxylate-atropine (LOMOTIL) 2.5-0.025 MG per tablet Take 1 tablet by mouth 4 (four) times daily as needed for diarrhea or loose stools. 60 tablet 1  . ergocalciferol (VITAMIN D2) 50000 UNITS capsule Take 1  capsule (50,000 Units total) by mouth once a week. 6 capsule 0  . NASCOBAL 500 MCG/0.1ML SOLN USE 1 SPRAY (0.1 ML/500 MCG TOTAL) INTO THE NOSE ONCE A WEEK 3 Bottle 3  . RABEprazole (ACIPHEX) 20 MG tablet TAKE 1 TABLET DAILY 90 tablet 4  . saccharomyces boulardii (FLORASTOR) 250 MG capsule Take 1 capsule (250 mg total) by mouth 2 (two) times daily. 60 capsule 1  . timolol (TIMOPTIC-XR) 0.5 % ophthalmic gel-forming Place 1 drop into both eyes 2 (two) times daily.      No current facility-administered medications on file prior to visit.    Past Medical History  Diagnosis Date  . HTN (hypertension)   . LBP (low back pain)   . Menopause   . Vitamin B12 deficiency   . IBS (irritable bowel syndrome)   . Vitamin D deficiency   . GERD (gastroesophageal reflux disease)   . Ovarian cancer 09/2008    Dr Gwyneth Revels  . Glaucoma (increased eye pressure)   . Pancreatic cyst   . Adenomatous colon polyp     Review of Systems  Constitutional: Negative for fever and chills.  Gastrointestinal: Negative for nausea and vomiting.  Genitourinary: Positive for urgency, frequency, hematuria, flank pain and vaginal pain. Negative for  dysuria, vaginal bleeding and vaginal discharge.      Objective:    BP 142/90 mmHg  Pulse 79  Temp(Src) 98 F (36.7 C) (Oral)  Resp 18  Ht 5' 3.5" (1.613 m)  Wt 189 lb 1.9 oz (85.784 kg)  BMI 32.97 kg/m2  SpO2 98% Nursing note and vital signs reviewed.  Physical Exam  Constitutional: She is oriented to person, place, and time. She appears well-developed and well-nourished. No distress.  Cardiovascular: Normal rate, regular rhythm, normal heart sounds and intact distal pulses.   Pulmonary/Chest: Effort normal and breath sounds normal.  Abdominal: Soft. Normal appearance and bowel sounds are normal. There is tenderness in the right lower quadrant and suprapubic area. There is CVA tenderness. There is no rigidity, no rebound, no guarding and negative Murphy's sign.    Neurological: She is alert and oriented to person, place, and time.  Skin: Skin is warm and dry.  Psychiatric: She has a normal mood and affect. Her behavior is normal. Judgment and thought content normal.       Assessment & Plan:

## 2014-12-22 NOTE — Telephone Encounter (Signed)
Pt called with lower back pain, localizing to the right and now in the right flank since 1am Feels like a pressure to urinate.  Is passing urine without blood Does not feel "like my IBS" Uncomfortable, but no in distress.   No upper abd pain, nausea or vomiting No known fever.  Advice: Contact PCP as symptoms sound urinary and UTI, less likely kidney stone, needs to be excluded Will alert Dr. Olevia Perches to the call [name] (No other documentation in this chart, pt likely has another chart with more information, but this is correct name and DOB) Her phone # is [phone]

## 2014-12-23 ENCOUNTER — Ambulatory Visit
Admission: RE | Admit: 2014-12-23 | Discharge: 2014-12-23 | Disposition: A | Discharge status: Home / Self Care | Payer: Medicare Other | Source: Ambulatory Visit

## 2014-12-23 DIAGNOSIS — Z1231 Encounter for screening mammogram for malignant neoplasm of breast: Secondary | ICD-10-CM

## 2014-12-24 ENCOUNTER — Encounter: Payer: Self-pay | Admitting: Family

## 2014-12-24 LAB — URINE CULTURE
Colony Count: NO GROWTH
Organism ID, Bacteria: NO GROWTH

## 2015-01-01 ENCOUNTER — Ambulatory Visit (INDEPENDENT_AMBULATORY_CARE_PROVIDER_SITE_OTHER): Payer: Medicare Other | Admitting: *Deleted

## 2015-01-01 DIAGNOSIS — M810 Age-related osteoporosis without current pathological fracture: Secondary | ICD-10-CM

## 2015-01-01 MED ORDER — DENOSUMAB 60 MG/ML ~~LOC~~ SOLN
60.0000 mg | Freq: Once | SUBCUTANEOUS | Status: AC
  Administered 2015-01-01: 60 mg via SUBCUTANEOUS

## 2015-01-23 ENCOUNTER — Other Ambulatory Visit: Payer: Self-pay | Admitting: Oncology

## 2015-01-26 ENCOUNTER — Other Ambulatory Visit: Payer: Self-pay | Admitting: Internal Medicine

## 2015-01-26 ENCOUNTER — Telehealth: Payer: Self-pay | Admitting: Oncology

## 2015-01-26 NOTE — Telephone Encounter (Signed)
Called and spoke with patient and she is aware of her new appointment for(chemo pt)

## 2015-02-09 DIAGNOSIS — H40053 Ocular hypertension, bilateral: Secondary | ICD-10-CM | POA: Diagnosis not present

## 2015-02-12 ENCOUNTER — Ambulatory Visit: Payer: Medicare Other | Admitting: Oncology

## 2015-02-12 ENCOUNTER — Other Ambulatory Visit: Payer: Medicare Other

## 2015-02-20 ENCOUNTER — Telehealth: Payer: Self-pay | Admitting: Oncology

## 2015-02-20 NOTE — Telephone Encounter (Signed)
Called and left a message with a new appointment per dr ll(for chemo pt)

## 2015-02-26 ENCOUNTER — Ambulatory Visit: Payer: Medicare Other | Admitting: Oncology

## 2015-02-26 ENCOUNTER — Other Ambulatory Visit: Payer: Medicare Other

## 2015-03-02 ENCOUNTER — Other Ambulatory Visit: Payer: Self-pay | Admitting: Internal Medicine

## 2015-03-19 DIAGNOSIS — Z85828 Personal history of other malignant neoplasm of skin: Secondary | ICD-10-CM | POA: Diagnosis not present

## 2015-03-19 DIAGNOSIS — L718 Other rosacea: Secondary | ICD-10-CM | POA: Diagnosis not present

## 2015-03-19 DIAGNOSIS — L814 Other melanin hyperpigmentation: Secondary | ICD-10-CM | POA: Diagnosis not present

## 2015-03-19 DIAGNOSIS — L603 Nail dystrophy: Secondary | ICD-10-CM | POA: Diagnosis not present

## 2015-03-19 DIAGNOSIS — L821 Other seborrheic keratosis: Secondary | ICD-10-CM | POA: Diagnosis not present

## 2015-03-19 DIAGNOSIS — D1801 Hemangioma of skin and subcutaneous tissue: Secondary | ICD-10-CM | POA: Diagnosis not present

## 2015-03-19 DIAGNOSIS — L720 Epidermal cyst: Secondary | ICD-10-CM | POA: Diagnosis not present

## 2015-03-19 DIAGNOSIS — D224 Melanocytic nevi of scalp and neck: Secondary | ICD-10-CM | POA: Diagnosis not present

## 2015-03-22 ENCOUNTER — Other Ambulatory Visit: Payer: Self-pay | Admitting: Oncology

## 2015-03-22 DIAGNOSIS — C5700 Malignant neoplasm of unspecified fallopian tube: Secondary | ICD-10-CM

## 2015-03-26 ENCOUNTER — Telehealth: Payer: Self-pay | Admitting: Oncology

## 2015-03-26 ENCOUNTER — Ambulatory Visit (HOSPITAL_BASED_OUTPATIENT_CLINIC_OR_DEPARTMENT_OTHER): Payer: Medicare Other | Admitting: Oncology

## 2015-03-26 ENCOUNTER — Other Ambulatory Visit (HOSPITAL_BASED_OUTPATIENT_CLINIC_OR_DEPARTMENT_OTHER): Payer: Medicare Other

## 2015-03-26 ENCOUNTER — Encounter: Payer: Self-pay | Admitting: Oncology

## 2015-03-26 VITALS — BP 160/63 | HR 78 | Temp 98.2°F | Resp 18 | Ht 63.5 in | Wt 190.3 lb

## 2015-03-26 DIAGNOSIS — R1011 Right upper quadrant pain: Secondary | ICD-10-CM

## 2015-03-26 DIAGNOSIS — R922 Inconclusive mammogram: Secondary | ICD-10-CM

## 2015-03-26 DIAGNOSIS — C5700 Malignant neoplasm of unspecified fallopian tube: Secondary | ICD-10-CM | POA: Diagnosis not present

## 2015-03-26 LAB — CBC WITH DIFFERENTIAL/PLATELET
BASO%: 0.8 % (ref 0.0–2.0)
BASOS ABS: 0.1 10*3/uL (ref 0.0–0.1)
EOS%: 1.6 % (ref 0.0–7.0)
Eosinophils Absolute: 0.1 10*3/uL (ref 0.0–0.5)
HEMATOCRIT: 35.9 % (ref 34.8–46.6)
HGB: 12.3 g/dL (ref 11.6–15.9)
LYMPH#: 1.6 10*3/uL (ref 0.9–3.3)
LYMPH%: 24 % (ref 14.0–49.7)
MCH: 29 pg (ref 25.1–34.0)
MCHC: 34.1 g/dL (ref 31.5–36.0)
MCV: 84.8 fL (ref 79.5–101.0)
MONO#: 0.6 10*3/uL (ref 0.1–0.9)
MONO%: 8.2 % (ref 0.0–14.0)
NEUT#: 4.5 10*3/uL (ref 1.5–6.5)
NEUT%: 65.4 % (ref 38.4–76.8)
Platelets: 233 10*3/uL (ref 145–400)
RBC: 4.23 10*6/uL (ref 3.70–5.45)
RDW: 13.9 % (ref 11.2–14.5)
WBC: 6.8 10*3/uL (ref 3.9–10.3)

## 2015-03-26 NOTE — Progress Notes (Signed)
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 She saw Dr Josephina Shih in 08-2014 and will see him back in a year; we continue to monitor CA 125 levels, as this was elevated at presentation. Last imaging was CT AP 06-2014, done due to GI complaints. She is to see Dr [name] in Sept. She saw Dr Olevia Perches last in ~ fall 2015.  Patient has chronic GI symptoms which are intermittently more bothersome, including recently. She has intermittent loose stools and tends to have discomfort right lateral abdomen. She also has pain at times in right groin to right upper inner thigh. She had acute pain in April, thought to have passed kidney stone. She had pelvic physical therapy in Jan 2015 which was somewhat helpful then, but improvement did not persist. She has lax pelvic floor muscles per last CT report. She has not tried lactose free diet or gluten free diet, tho she had some negative testing for celiac disease. She has not been on probiotics. Symptoms significantly impact quality of life.  She denies bleeding including further hematuria. She has no LE swelling. She has no respiratory complaints.  No PAC CA 125 preoperatively was 206 in Jan 2010 I do not locate genetics testing in this EMR. Message sent to genetics counselors to see if this is in their records.  ONCOLOGIC HISTORY Patient was diagnosed with IIIC serous fallopian carcinoma at [facility] and omentectomy 10-07-2008, this optimal debulking with only residual being small involvement on peritoneal surfaces at completion of the surgery. She had 6 cycles of taxol/ carboplatin from 11-12-2008 thru 03-03-2009. She has been free of disease since that treatment was completed. Last imaging was PET in 04-2009, and last visit with Dr  Josephina Shih was 10-05-12 and she will be seen back by gyn oncology in a year. Preoperative CA 125 was 206 in Jan 2010; CA 125 in Jan 2014 was 6.9 and was 8.0 on 01-20-14    Review of systems as above, also: No fever or recent infectious illness. Intentional weight loss, tho has gained back several pounds after recent vacation. Remainder of 10 point Review of Systems negative.  Objective:  Vital signs in last 24 hours:  BP 160/63 mmHg  Pulse 78  Temp(Src) 98.2 F (36.8 C) (Oral)  Resp 18  Ht 5' 3.5" (1.613 m)  Wt 190 lb 4.8 oz (86.32 kg)  BMI 33.18 kg/m2  SpO2 99% Weight down 5 lbs. Alert, oriented and appropriate, very pleasant as always. Ambulatory without difficulty.   HEENT:PERRL, sclerae not icteric. Oral mucosa moist without lesions, posterior pharynx clear.  Neck supple. No JVD.  Lymphatics:no cervical,supraclavicular, axillary or inguinal adenopathy Resp: clear to auscultation bilaterally and normal percussion bilaterally Cardio: regular rate and rhythm. No gallop. GI: soft, nontender, not distended, no mass or organomegaly. Normally active bowel sounds. Surgical incision not remarkable. Musculoskeletal/ Extremities: without pitting edema, cords, tenderness Neuro: no peripheral neuropathy. Otherwise nonfocal. Psych appropriate mood and affect Skin without rash, ecchymosis, petechiae   Lab Results:  Results for orders placed or performed in visit on 03/26/15  CBC with Differential  Result Value Ref Range   WBC 6.8 3.9 - 10.3 10e3/uL   NEUT# 4.5 1.5 - 6.5 10e3/uL   HGB 12.3 11.6 - 15.9 g/dL   HCT 35.9 34.8 - 46.6 %   Platelets 233  145 - 400 10e3/uL   MCV 84.8 79.5 - 101.0 fL   MCH 29.0 25.1 - 34.0 pg   MCHC 34.1 31.5 - 36.0 g/dL   RBC 4.23 3.70 - 5.45 10e6/uL   RDW 13.9 11.2 - 14.5 %   lymph# 1.6 0.9 - 3.3 10e3/uL   MONO# 0.6 0.1 - 0.9 10e3/uL   Eosinophils Absolute 0.1 0.0 - 0.5 10e3/uL   Basophils Absolute 0.1 0.0 - 0.1 10e3/uL   NEUT% 65.4 38.4 - 76.8 %    LYMPH% 24.0 14.0 - 49.7 %   MONO% 8.2 0.0 - 14.0 %   EOS% 1.6 0.0 - 7.0 %   BASO% 0.8 0.0 - 2.0 %   CA 125 available after visit in stable low range at 10  Other labs from 11-2014: BMET and LFTs normal. TSH 1.55. B12 673. Vit D low at 21  Studies/Results:  Report of [facility] mammograms 3D bilateral screening from 12-23-14 heterogeneously dense breast tissue without other mammographic findings of concern.  Medications: I have reviewed the patient's current medications.  DISCUSSION Elimination diets discussed. Suggested considering pelvic floor PT again, which she might consider. Probiotics mentioned. She plans to try a combination miralax and fiber supplement which she has recently found.  Genetics information above not discussed at visit. She plans to see Dr Josephina Shih in Dec.  Assessment/Plan:  1.History of stage IIIC serous fallopian carcinoma 2010, treatment as above, not known recurrent, tho lots of GI symptoms as she has had. CA 125 essentially stable in low range. Will suggest genetics testing if not already done. She will see Dr Josephina Shih in Dec and I will see her again in a year.  2. Intermittent diarrhea and abdominal pain as above. CT 06-2014 without findings of concern for recurrent gyn malignancy 3.type 2 diabetes  4. HTN, GERD, IBS, glaucoma  5.obesity: trying to address 6.Family history of dystonic reactions to compazine 7.dense breast tissue: 3D mammography appropriate 8.presumed nephrolithiasis 12-2014 9. CT findings suggest lax pelvic floor muscles  All questions answered. Time spent 25 min including >50% counseling and coordination of care.    Gordy Levan, MD   03/26/2015, 5:26 PM

## 2015-03-26 NOTE — Telephone Encounter (Signed)
Labs/ov scheduled for next year but can't due to no provider schedule. Printed AVS and advised someone would call her for her next apt in a year.... Gave POF to Amber to put in file with others... KJ

## 2015-03-27 LAB — CA 125: CA 125: 10 U/mL (ref <35)

## 2015-03-28 DIAGNOSIS — R922 Inconclusive mammogram: Secondary | ICD-10-CM | POA: Insufficient documentation

## 2015-03-30 ENCOUNTER — Telehealth: Payer: Self-pay | Admitting: Oncology

## 2015-03-30 ENCOUNTER — Telehealth: Payer: Self-pay

## 2015-03-30 ENCOUNTER — Other Ambulatory Visit: Payer: Self-pay | Admitting: Oncology

## 2015-03-30 ENCOUNTER — Encounter: Payer: Self-pay | Admitting: Oncology

## 2015-03-30 DIAGNOSIS — C5700 Malignant neoplasm of unspecified fallopian tube: Secondary | ICD-10-CM

## 2015-03-30 NOTE — Progress Notes (Signed)
Medical Oncology  Per genetics counselor: "I do not see that genetics was performed here in [place]. She is not in the database that I had received from [place] before she left, and I do not find test results in [place].  Even if she had testing in 2010, she should consider updated testing if it was negative. We would love to see her."  RN to speak with patient to let her know that I recommend talking with genetics counselor, as this information may be helpful as we continue to follow her.   Marvis Moeller, MD

## 2015-03-30 NOTE — Telephone Encounter (Signed)
Gave Ms. Lozano the results of the CA-125 as noted below by Dr. Marko Plume. Ms. Dicke is agreeable to genetic counseling as noted below by Dr. Marko Plume.  She has appointment with Roma Kayser  04-13-15.

## 2015-03-30 NOTE — Telephone Encounter (Signed)
Confirmed appointment for genetics on 08/08

## 2015-03-30 NOTE — Telephone Encounter (Signed)
[name], [name] - 03/26/15 >','<< Less Detail',event)" href="javascript:;">More Detail >>   Gordy Levan, MD   Sent: Sun March 29, 2015 8:38 PM    To: Baruch Merl, RN        Result Note     Labs seen and need follow up: please let her know CA 125 in good low range at 10     Attached to     CBC WITH DIFFERENTIAL/PLATELET (Order# [id number]); CA 125 (Order# [id number])      CA 125  Status: Finalresult Visible to patient:  Not Released Nextappt: 04/13/2015 at 01:00 PM in Oncology [place], Emi Belfast, Donia Pounds) Dx:  Fallopian tube cancer, carcinoma, uns...       Notes Recorded by Gordy Levan, MD on 03/29/2015 at 8:38 PM Labs seen and need follow up: please let her know CA 125 in good low range at 10       Ref Range 4d ago (03/26/15) 14mo ago (08/07/14) 44mo ago (08/07/14) 63yr ago (01/20/14) 25yr ago (09/06/13) 23yr ago (02/04/13) 21yr ago (10/05/12)   CA 125 <35 U/mL 10 8.0R, CM 10CM 8.0R 7.4R 6.2R 6.9R  Comments: This test was performed using the [place] chemiluminescentmethod. Values obtained from different assay methods cannot be usedinterchangeably. CA125 levels , regardless of value, should not beinterpreted as absolute evidence of the presence or  absence ofdisease.    Resulting Agency  [facility]  Narrative    Performed at: [address], [address]        [place], [place] [address]      Specimen Collected: 03/26/15 2:42 PM Last Resulted: 03/27/15 11:57 AM Lab Flowsheet Order Details View Encounter Lab and Collection Details Routing Result History    CM=Additional commentsR=Reference range differs from displayed range        CBC with Differential  Status: Finalresult Visible to patient:  Not Released Nextappt: 04/13/2015 at 01:00 PM in Oncology [place], Emi [place], [place]) Dx:  Fallopian tube  cancer, carcinoma, uns...       Notes Recorded by Gordy Levan, MD on 03/29/2015 at 8:38 PM Labs seen and need follow up: please let her know CA 125 in good low range at 10       Ref Range 4d ago (03/26/15) 62mo ago (11/12/14) 72mo ago (05/27/14) 39yr ago (01/20/14) 61yr ago (08/07/13) 38yr ago (02/04/13) 36yr ago (08/07/12)   WBC 3.9 - 10.3 10e3/uL 6.8 5.4R 6.2R 8.6R 6.7R 5.0 5.5R   NEUT# 1.5 - 6.5 10e3/uL 4.5 3.5R 4.0R 7.0R 4.5R 3.4 3.6R   HGB 11.6 - 15.9 g/dL 12.3 12.1R 11.8 (L)R 12.5R 12.1R 11.6 11.6 (L)R   HCT 34.8 - 46.6 % 35.9 34.6 (L)R 34.8 (L)R 36.8R 35.2 (L)R 33.3 (L) 34.6 (L)R   Platelets 145 - 400 10e3/uL 233 219.0R 214.0R 221.0R 214.0R 174 210.0R   MCV 79.5 - 101.0 fL 84.8 84.4R 87.2R 85.9R 85.2R 85.1 86.2R   MCH 25.1 - 34.0 pg 29.0     29.5    MCHC 31.5 - 36.0 g/dL 34.1 35.0R 34.0R 34.1R 34.5R 34.7 33.7R   RBC 3.70 - 5.45 10e6/uL 4.23 4.10R 3.99R 4.28R 4.13R 3.92 4.01R   RDW 11.2 - 14.5 % 13.9 13.1R 13.5R 13.6R 13.5R 13.6 13.9R   lymph# 0.9 - 3.3 10e3/uL 1.6     1.2    MONO# 0.1 - 0.9 10e3/uL 0.6  0.3    Eosinophils Absolute 0.0 - 0.5 10e3/uL 0.1 0.1R 0.1R 0.1R 0.1R 0.1 0.1R   Basophils Absolute 0.0 - 0.1 10e3/uL 0.1 0.0R 0.0R 0.0R 0.0R 0.0 0.0R   NEUT% 38.4 - 76.8 % 65.4     66.8    LYMPH% 14.0 - 49.7 % 24.0     24.3    MONO% 0.0 - 14.0 % 8.2     6.1    EOS% 0.0 - 7.0 % 1.6     2.0    BASO% 0.0 - 2.0 % 0.8     0.8   Resulting Agency  [facility]  Narrative    Performed At: [facility]        [address]        [place], [place] [address]      Specimen Collected: 03/26/15 2:42 PM Last Resulted: 03/26/15 2:51 PM Lab Flowsheet Order Details View Encounter Lab and Collection Details Routing Result History    R=Reference range differs from displayed range           CA 125  Status: Finalresult Visible to patient:  Not Released  Nextappt: 04/13/2015 at 01:00 PM in Oncology [place], Emi Belfast, [place]) Dx:  Fallopian tube cancer, carcinoma, uns...       Notes Recorded by Gordy Levan, MD on 03/29/2015 at 8:38 PM Labs seen and need follow up: please let her know CA 125 in good low range at 10       Ref Range 4d ago (03/26/15) 85mo ago (08/07/14) 46mo ago (08/07/14) 23yr ago (01/20/14) 50yr ago (09/06/13) 66yr ago (02/04/13) 79yr ago (10/05/12)   CA 125 <35 U/mL 10 8.0R, CM 10CM 8.0R 7.4R 6.2R 6.9R  Comments: This test was performed using the [place] chemiluminescentmethod. Values obtained from different assay methods cannot be usedinterchangeably. CA125 levels , regardless of value, should not beinterpreted as absolute evidence of the presence or  absence ofdisease.    Resulting Agency  [facility]  Narrative    Performed at: [address], [address]        [place], [place] [address]      Specimen Collected: 03/26/15 2:42 PM Last Resulted: 03/27/15 11:57 AM Lab Flowsheet Order Details View Encounter Lab and Collection Details Routing Result History    CM=Additional commentsR=Reference range differs from displayed range        CBC with Differential  Status: Finalresult Visible to patient:  Not Released Nextappt: 04/13/2015 at 01:00 PM in Oncology [place], Emi [place], [place]) Dx:  Fallopian tube cancer, carcinoma, uns...       Notes Recorded by Gordy Levan, MD on 03/29/2015 at 8:38 PM Labs seen and need follow up: please let her know CA 125 in good low range at 10       Ref Range 4d ago (03/26/15) 52mo ago (11/12/14) 37mo ago (05/27/14) 36yr ago (01/20/14) 50yr ago (08/07/13) 74yr ago (02/04/13) 17yr ago (08/07/12)   WBC 3.9 - 10.3 10e3/uL 6.8 5.4R 6.2R 8.6R 6.7R 5.0 5.5R   NEUT# 1.5 - 6.5 10e3/uL 4.5 3.5R 4.0R 7.0R 4.5R 3.4 3.6R   HGB 11.6 - 15.9 g/dL 12.3  12.1R 11.8 (L)R 12.5R 12.1R 11.6 11.6 (L)R   HCT 34.8 - 46.6 % 35.9 34.6 (L)R 34.8 (L)R 36.8R 35.2 (L)R 33.3 (L)  34.6 (L)R   Platelets 145 - 400 10e3/uL 233 219.0R 214.0R 221.0R 214.0R 174 210.0R   MCV 79.5 - 101.0 fL 84.8 84.4R 87.2R 85.9R 85.2R 85.1 86.2R   MCH 25.1 - 34.0 pg 29.0     29.5    MCHC 31.5 - 36.0 g/dL 34.1 35.0R 34.0R 34.1R 34.5R 34.7 33.7R   RBC 3.70 - 5.45 10e6/uL 4.23 4.10R 3.99R 4.28R 4.13R 3.92 4.01R   RDW 11.2 - 14.5 % 13.9 13.1R 13.5R 13.6R 13.5R 13.6 13.9R   lymph# 0.9 - 3.3 10e3/uL 1.6     1.2    MONO# 0.1 - 0.9 10e3/uL 0.6     0.3    Eosinophils Absolute 0.0 - 0.5 10e3/uL 0.1 0.1R 0.1R 0.1R 0.1R 0.1 0.1R   Basophils Absolute 0.0 - 0.1 10e3/uL 0.1 0.0R 0.0R 0.0R 0.0R 0.0 0.0R   NEUT% 38.4 - 76.8 % 65.4     66.8    LYMPH% 14.0 - 49.7 % 24.0     24.3    MONO% 0.0 - 14.0 % 8.2     6.1    EOS% 0.0 - 7.0 % 1.6     2.0    BASO% 0.0 - 2.0 % 0.8     0.8   Resulting Agency  [facility]  Narrative    Performed At: [facility]        [address]        [place], [place] [address]      Specimen Collected: 03/26/15 2:42 PM Last Resulted: 03/26/15 2:51 PM Lab Flowsheet Order Details View Encounter Lab and Collection Details Routing Result History    R=Reference range differs from displayed range

## 2015-03-30 NOTE — Telephone Encounter (Signed)
-----   Message from [place] sent at 03/30/2015  8:38 AM EDT ----- I do not see that genetics was performed here in [place].  She is not in the database that I had received from [place] before she left, and I do not find test results in [place].  Even if she had testing in 2010, she should consider updated testing if it was negative.  We would love to see her.  Santiago Glad ----- Message -----    From: Gordy Levan, MD    Sent: 03/28/2015  10:35 AM      To: Baruch Merl, RN, Clarene Essex, [place] Lathe - serous fallopian ca dx ~ 10-2008. I cannot locate genetics testing. Can you please see if this has been done in [place]? Please let Barbaraann Share and myself know if not; please put information in [facility] EMR notes if so.  Barbaraann Share - if she has not had genetics counseling, please let her know that would be a good idea, and set up if she agrees.  This is not a rush - thank you both [place]

## 2015-04-13 ENCOUNTER — Other Ambulatory Visit: Payer: Medicare Other

## 2015-04-13 ENCOUNTER — Ambulatory Visit (HOSPITAL_BASED_OUTPATIENT_CLINIC_OR_DEPARTMENT_OTHER): Payer: Medicare Other | Admitting: Genetic Counselor

## 2015-04-13 ENCOUNTER — Encounter: Payer: Self-pay | Admitting: Genetic Counselor

## 2015-04-13 DIAGNOSIS — Z803 Family history of malignant neoplasm of breast: Secondary | ICD-10-CM | POA: Diagnosis not present

## 2015-04-13 DIAGNOSIS — C5700 Malignant neoplasm of unspecified fallopian tube: Secondary | ICD-10-CM | POA: Diagnosis not present

## 2015-04-13 DIAGNOSIS — Z806 Family history of leukemia: Secondary | ICD-10-CM

## 2015-04-13 DIAGNOSIS — Z315 Encounter for genetic counseling: Secondary | ICD-10-CM | POA: Diagnosis not present

## 2015-04-13 DIAGNOSIS — Z86010 Personal history of colonic polyps: Secondary | ICD-10-CM

## 2015-04-13 DIAGNOSIS — C569 Malignant neoplasm of unspecified ovary: Secondary | ICD-10-CM | POA: Diagnosis not present

## 2015-04-13 DIAGNOSIS — K635 Polyp of colon: Secondary | ICD-10-CM | POA: Diagnosis not present

## 2015-04-13 DIAGNOSIS — Z801 Family history of malignant neoplasm of trachea, bronchus and lung: Secondary | ICD-10-CM

## 2015-04-13 DIAGNOSIS — Z800 Family history of malignant neoplasm of digestive organs: Secondary | ICD-10-CM

## 2015-04-13 NOTE — Progress Notes (Signed)
REFERRING PROVIDER: Evlyn Clines, MD  PRIMARY PROVIDER:  Walker Kehr, MD  PRIMARY REASON FOR VISIT:  1. Fallopian tube cancer, carcinoma, unspecified laterality   2. History of adenomatous polyp of colon   3. Family history of stomach cancer   4. Family history of leukemia   5. Family history of lung cancer   6. Family history of breast cancer in female      HISTORY OF PRESENT ILLNESS:   [name], a 69 y.o. female, was seen for a [facility] cancer genetics consultation at the request of Dr. Marko Plume due to a personal history of fallopian tube cancer and family history of cancer.  [name] presents to clinic today to discuss the possibility of a hereditary predisposition to cancer, genetic testing, and to further clarify her future cancer risks, as well as potential cancer risks for family members.   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 This was treated with TAH-BSO, omentectomy, and chemotherapy.    CANCER HISTORY:   No history exists.  2010 - Fallopian tube adenocarcinoma with serous features.   HORMONAL RISK FACTORS:  Menarche was at age 11-12.  First live birth at age 53.  OCP use for approximately 5 years.  Ovaries intact: no.  Hysterectomy: yes.  Menopausal status: postmenopausal.  HRT use: 5 years. Colonoscopy: yes; hx of approx 10-12 total polyps - at least 7 of which were adenomatous; first of which dx at 70y or younger. Mammogram within the last year: yes. Number of breast biopsies: 0. Up to date with pelvic exams:  yes. Any excessive radiation exposure in the past:  no  Past Medical History  Diagnosis Date  . HTN (hypertension)   . LBP (low back pain)   . Menopause   . Vitamin B12 deficiency   . IBS (irritable bowel syndrome)   . Vitamin D deficiency   . GERD (gastroesophageal reflux disease)   . Ovarian cancer 09/2008    Dr Gwyneth Revels  . Glaucoma (increased eye  pressure)   . Pancreatic cyst   . Adenomatous colon polyp     Past Surgical History  Procedure Laterality Date  . Ovarian cancer debulking  09/2008  . Hemorrhoid surgery  2001    History   Social History  . Marital Status: Married    Spouse Name: Dominica Severin  . Number of Children: 2  . Years of Education: N/A   Occupational History  . Homemaker    Social History Main Topics  . Smoking status: Never Smoker   . Smokeless tobacco: Never Used  . Alcohol Use: No  . Drug Use: No  . Sexual Activity: Not on file   Other Topics Concern  . Not on file   Social History Narrative     FAMILY HISTORY:  We obtained a detailed, 4-generation family history.  Significant diagnoses are listed below: Family History  Problem Relation Age of Onset  . Alzheimer's disease Mother 87  . Other Mother 27    TAH for excessive bleeding  . Lung cancer Father     smoker; metastasis to stomach and other areas  . Heart attack Maternal Uncle   . Other Paternal Aunt     stomach issues  . Heart Problems Paternal Uncle   . Other Maternal Grandmother     stomach issues; +hysterectomy  . Heart attack Maternal Grandfather   . Infertility Daughter   . Stomach cancer Cousin  dx. mid-60s  . Other Cousin     stomach issues  . Leukemia Cousin 18  . Stomach cancer Other   . Cancer Cousin     unknown type  . Other Cousin     stomach issues  . Heart Problems Maternal Uncle   . Diabetes Paternal Aunt   . Heart Problems Paternal Uncle   . Emphysema Paternal Uncle     work exposure  . Breast cancer Cousin     dx. late 60s-early 88s    [name] has two daughters, ages 52 and 42--neither of whom have had cancer.  [name] is an only child.  Her mother died at 29; she had a history of a hysterectomy at 43 due to excessive bleeding.  Her father died at 37 from lung cancer; he was a smoker and the cancer reportedly metastasized to his stomach and other areas of the body.    There is a maternal  family history of stomach cancer in one maternal first cousin and in one great aunt.  The maternal first cousin was diagnosed in his mid-50s.  He had stomach issues which led up to the discovery of this cancer.  [name] great aunt was diagnosed with stomach cancer at an unknown age.  [name] mother had two full sisters and two full brothers.  One brother is alive and cancer free at 32.  Both sisters passed away at 50, but were cancer-free.  Another maternal uncle died of a heart attack.  One maternal first cousin (a son of one of the maternal aunts) was diagnosed with and passed away from leukemia at the age of 40.  [name] maternal grandmother died at 17.  She also had a history of stomach issues, and also had a history of a hysterectomy.  This grandmother was the sister of the great aunt with stomach cancer.  There were nine additional siblings, many of whom had stomach and heart-related issues.  A son of one maternal great uncle was diagnosed with a unknown type cancer and also had stomach problems.  [name] maternal grandfather died in his early 72s of a heart attack.    [name] father had two full brothers and two full sisters.  All of these aunts and uncles reportedly died in their late 56s.  One paternal first cousin, once-removed was diagnosed with breast cancer in her late 33s-early 70s.  Another maternal first cousin once-removed was diagnosed with Crohn's disease, which may also explain some of the stomach issues for relatives on the paternal family side.  [name] has no information for her paternal grandparents, but believes her grandfather died at a young age (less than 39s).  Patient's maternal ancestors are of Caucasian descent, and paternal ancestors are of Caucasian-Irish/Italian descent. There is no reported Ashkenazi Jewish ancestry. There is no known consanguinity.  GENETIC COUNSELING ASSESSMENT: [name] is a 69 y.o. female with a personal history  of fallopian tube cancer and adenomatous polyps and family history of cancer which is somewhat suggestive of a hereditary cancer syndrome and predisposition to cancer. We, therefore, discussed and recommended the following at today's visit.   DISCUSSION: We reviewed the characteristics, features and inheritance patterns of hereditary cancer syndromes, particularly those caused by mutations within the Lynch syndrome and BRCA1/2 genes. We also discussed genetic testing, including the appropriate family members to test, the process of testing, insurance coverage and turn-around-time for results. We discussed the implications of a negative, positive and/or  variant of uncertain significant result. We also discussed the personal history of several adenomatous polyps and the family history of stomach problems that may or may not have been related to other GI or polyp concerns.  We discussed potentially utilizing genetic testing for a larger panel to pick up on any genetic mutations which might cause an increased risk for polyposis conditions.  [name] was interested in a larger panel option.  Thus, we recommended [name] pursue genetic testing for the 32-gene CancerNext Panel through [facility] ([place], [place]).  The CancerNext Panel includes sequencing and deletion/duplication analysis for the following 30 genes: APC, ATM, BARD1, BRCA1, BRCA2, BRIP1, BMPR1A, CDH1, CDK4, CDKN2A, CHEK2, MLH1, MRE11A, MSH2, MSH6, MUTYH, NBN, NF1, PALB2, PMS2, POLD1, POLE, PTEN, RAD50, RAD51C, RAD51D, SMAD4, SMARCA4, STK11, and TP53.  This panel also includes deletion/duplication analysis (without sequencing) for two genes, EPCAM and SCG5/GREM1.  Based on [name]'s personal history of cancer, she meets medical criteria for genetic testing. Despite that she meets criteria, she may still have an out of pocket cost. We discussed that if her out of pocket cost for testing is over $100, the laboratory will call  and confirm whether she wants to proceed with testing.  If the out of pocket cost of testing is less than $100 she will be billed by the genetic testing laboratory.   PLAN: After considering the risks, benefits, and limitations, [name]  provided informed consent to pursue genetic testing and the blood sample was sent to [facility] for analysis of the 32-gene CancerNext Panel. Results should be available within approximately 3-4 weeks' time, at which point they will be disclosed by telephone to [name], as will any additional recommendations warranted by these results. [name] will receive a summary of her genetic counseling visit and a copy of her results once available. This information will also be available in Epic. We encouraged Ms. Thoma to remain in contact with cancer genetics annually so that we can continuously update the family history and inform her of any changes in cancer genetics and testing that may be of benefit for her family. Ms. Procell questions were answered to her satisfaction today. Our contact information was provided should additional questions or concerns arise.  Thank you for the referral and allowing [place] to share in the care of your patient.   Jeanine Luz, MS Genetic Counselor [email or web address] Phone: [phone]  The patient was seen for a total of 60 minutes in face-to-face genetic counseling.  This patient was discussed with Drs. Magrinat, Lindi Adie and/or Burr Medico who agrees with the above.    _______________________________________________________________________ For Office Staff:  Number of people involved in session: 2 Was an Intern/ student involved with case: no

## 2015-05-01 ENCOUNTER — Telehealth: Payer: Self-pay | Admitting: Genetic Counselor

## 2015-05-04 ENCOUNTER — Ambulatory Visit: Payer: Self-pay | Admitting: Genetic Counselor

## 2015-05-04 DIAGNOSIS — Z86010 Personal history of colonic polyps: Secondary | ICD-10-CM | POA: Insufficient documentation

## 2015-05-04 DIAGNOSIS — C5700 Malignant neoplasm of unspecified fallopian tube: Secondary | ICD-10-CM

## 2015-05-04 DIAGNOSIS — Z800 Family history of malignant neoplasm of digestive organs: Secondary | ICD-10-CM

## 2015-05-04 DIAGNOSIS — Z1379 Encounter for other screening for genetic and chromosomal anomalies: Secondary | ICD-10-CM

## 2015-05-04 NOTE — Progress Notes (Signed)
GENETIC TEST RESULTS  HPI: [name] was previously seen in the [facility] due to a personal history of adenocarcinoma of the fallopian tube and personal history of colon polyps, family history of breast, stomach and other cancers, and concerns regarding a hereditary predisposition to cancer. Please refer to our prior cancer genetics clinic note from April 13, 2015 for more information regarding [name]'s medical, social and family histories, and our assessment and recommendations, at the time. [name] recent genetic test results were disclosed to her, as were recommendations warranted by these results. These results and recommendations are discussed in more detail below.  GENETIC TEST RESULTS: At the time of [name] visit on 04/13/15, we recommended she pursue genetic testing of the 32-gene CancerNext Panel through [employer] [facility], [place]).  The 32-gene CancerNext Panel offered by [employer] includes sequencing and deletion/duplication analysis of the following 30 genes: APC, ATM, BARD1, BRCA1, BRCA2, BRIP1, BMPR1A, CDH1, CDK4, CDKN2A, CHEK2,  MLH1, MRE11A, MSH2, MSH6, MUTYH, NBN, NF1, PALB2, PMS2, POLD1, POLE, PTEN, RAD50, RAD51C, RAD51D, SMAD4, SMARCA4, STK11, and TP53.  This panel also includes deletion/duplication analysis (without sequencing) for two genes, EPCAM and GREM1/SCG5.  Those results are now back, the report date of which is April 29, 2015.  Genetic testing was normal, and did not reveal a deleterious mutation in these genes. The test report will be scanned into EPIC and will be located under the Results Review tab in the Surgical Pathology>Molecular Pathology section.   We discussed with [name] that since the current genetic testing is not perfect, it is possible there may be a gene mutation in one of these genes that current testing cannot detect, but that chance is small. We also discussed, that it is possible that  another gene that has not yet been discovered, or that we have not yet tested, is responsible for the cancer diagnoses in the family, and it is, therefore, important to remain in touch with cancer genetics in the future so that we can continue to offer [name] the most up to date genetic testing.   CANCER SCREENING RECOMMENDATIONS: This result is reassuring and indicates that [name] likely does not have an increased risk for a future cancer due to a mutation in one of these genes. This normal test also suggests that [name]'s cancer was most likely not due to an inherited predisposition associated with one of these genes.  Most cancers happen by chance and this negative test suggests that her cancer falls into this category.  We, therefore, recommended she continue to follow the cancer management and screening guidelines provided by her oncology and primary healthcare providers.   RECOMMENDATIONS FOR FAMILY MEMBERS: Women in this family might be at some increased risk of developing cancer, over the general population risk, simply due to the family history of cancer. We recommended women in this family have a yearly mammogram beginning at age 28, or 39 years younger than the earliest onset of cancer, an an annual clinical breast exam, and perform monthly breast self-exams. Women in this family should also have a gynecological exam as recommended by their primary provider. All family members should have a colonoscopy by age 33, or more often as indicated by the family history/personal history of colon polyps.  [name] daughters should make their doctors aware of the family history of cancer and colon polyps, so that they may receive the most appropriate cancer screening in the future.  FOLLOW-UP:  Lastly, we discussed with [name] that cancer genetics is a rapidly advancing field and it is possible that new genetic tests will be appropriate for her and/or her family members in the future.  We encouraged her to remain in contact with cancer genetics on an annual basis so we can update her personal and family histories and let her know of advances in cancer genetics that may benefit this family.   Our contact number was provided. [name] questions were answered to her satisfaction, and she knows she is welcome to call us at anytime with additional questions or concerns.   Jeanine Luz, MS Genetic Counselor [email or web address] Phone: [phone]

## 2015-05-04 NOTE — Telephone Encounter (Signed)
Discussed with Ms. Wampole that her genetic test results were negative for mutations within any of 32 genes that would cause her to be at an increased risk for breast, ovarian, colon, or other related cancers.  Additionally, no variants of uncertain significance (VUSs) were found.  While we still do not have an explanation for Ms. Reisig's fallopian tube cancer or the family history of cancer, most cancers are caused by chance and many of Ms. Crossley family members are either unaffected by cancer or were diagnosed at a later age in life.  We discussed the family history of breast and ovarian cancer, and that her daughters should make their primary doctors aware of the family history if they are not already aware, so that they may begin the most appropriate cancer screening at the indicated age.  We also discussed Ms. Campus history of adenomatous colon polyps and that this information would also be helpful for her daughters' doctors to have, so that they might begin colonoscopy screening when appropriate.  This test result is likely very reassuring for [place], but Ms. Desrosiers is more than welcome to call us back in the future with any further questions or updates to the family history.  Her test result will be scanned into her record so that Dr. Marko Plume and her other doctors will be able to view her results.  Ms. Somma would like a copy of these test results emailed to her in a secure email, and we are happy to do that.

## 2015-05-14 ENCOUNTER — Encounter (HOSPITAL_COMMUNITY): Payer: Self-pay

## 2015-05-15 ENCOUNTER — Other Ambulatory Visit (INDEPENDENT_AMBULATORY_CARE_PROVIDER_SITE_OTHER): Payer: Medicare Other

## 2015-05-15 ENCOUNTER — Ambulatory Visit (INDEPENDENT_AMBULATORY_CARE_PROVIDER_SITE_OTHER): Payer: Medicare Other | Admitting: Internal Medicine

## 2015-05-15 ENCOUNTER — Encounter: Payer: Self-pay | Admitting: Internal Medicine

## 2015-05-15 VITALS — BP 140/70 | HR 94 | Wt 193.0 lb

## 2015-05-15 DIAGNOSIS — E538 Deficiency of other specified B group vitamins: Secondary | ICD-10-CM | POA: Diagnosis not present

## 2015-05-15 DIAGNOSIS — R739 Hyperglycemia, unspecified: Secondary | ICD-10-CM | POA: Diagnosis not present

## 2015-05-15 DIAGNOSIS — I10 Essential (primary) hypertension: Secondary | ICD-10-CM

## 2015-05-15 DIAGNOSIS — E559 Vitamin D deficiency, unspecified: Secondary | ICD-10-CM | POA: Diagnosis not present

## 2015-05-15 DIAGNOSIS — K589 Irritable bowel syndrome without diarrhea: Secondary | ICD-10-CM | POA: Diagnosis not present

## 2015-05-15 LAB — BASIC METABOLIC PANEL
BUN: 13 mg/dL (ref 6–23)
CALCIUM: 9.1 mg/dL (ref 8.4–10.5)
CHLORIDE: 106 meq/L (ref 96–112)
CO2: 28 mEq/L (ref 19–32)
CREATININE: 0.81 mg/dL (ref 0.40–1.20)
GFR: 74.42 mL/min (ref >60.00)
Glucose, Bld: 86 mg/dL (ref 70–99)
Potassium: 3.4 mEq/L — ABNORMAL LOW (ref 3.5–5.1)
Sodium: 143 mEq/L (ref 135–145)

## 2015-05-15 LAB — HEPATIC FUNCTION PANEL
ALBUMIN: 4.3 g/dL (ref 3.5–5.2)
ALT: 19 U/L (ref 0–35)
AST: 20 U/L (ref 0–37)
Alkaline Phosphatase: 67 U/L (ref 39–117)
BILIRUBIN TOTAL: 0.6 mg/dL (ref 0.2–1.2)
Bilirubin, Direct: 0.2 mg/dL (ref 0.0–0.3)
Total Protein: 7.6 g/dL (ref 6.0–8.3)

## 2015-05-15 LAB — HEMOGLOBIN A1C: Hgb A1c MFr Bld: 5.9 % (ref 4.6–6.5)

## 2015-05-15 LAB — VITAMIN D 25 HYDROXY (VIT D DEFICIENCY, FRACTURES): VITD: 22.23 ng/mL — AB (ref 30.00–100.00)

## 2015-05-15 MED ORDER — ERGOCALCIFEROL 1.25 MG (50000 UT) PO CAPS: 50000.0000 [IU] | ORAL_CAPSULE | ORAL | Status: DC

## 2015-05-15 MED ORDER — CHOLECALCIFEROL 50 MCG (2000 UT) PO TABS: 4000.0000 [IU] | ORAL_TABLET | Freq: Every morning | ORAL | Status: DC

## 2015-05-15 MED ORDER — DIPHENOXYLATE-ATROPINE 2.5-0.025 MG PO TABS: 1.0000 | ORAL_TABLET | Freq: Four times a day (QID) | ORAL | Status: DC | PRN

## 2015-05-15 MED ORDER — POTASSIUM CHLORIDE ER 8 MEQ PO TBCR: 8.0000 meq | EXTENDED_RELEASE_TABLET | Freq: Every day | ORAL | Status: DC

## 2015-05-15 NOTE — Progress Notes (Signed)
Pre visit review using our clinic review tool, if applicable. No additional management support is needed unless otherwise documented below in the visit note. 

## 2015-05-15 NOTE — Assessment & Plan Note (Signed)
A1c

## 2015-05-15 NOTE — Assessment & Plan Note (Signed)
On Vit D 

## 2015-05-15 NOTE — Assessment & Plan Note (Signed)
On B12 

## 2015-05-15 NOTE — Progress Notes (Signed)
Subjective:  Patient ID: [name], female    DOB: 1945/09/25  Age: 69 y.o. MRN: [id number]  CC: No chief complaint on file.   HPI MAIREAD SCHWARZKOPF presents for diarrhea/odor, anxiety, Vit B12 def f/u  Outpatient Prescriptions Prior to Visit  Medication Sig Dispense Refill  . aspirin 81 MG EC tablet Take 81 mg by mouth daily.      . bimatoprost (LUMIGAN) 0.03 % ophthalmic solution Place 1 drop into both eyes at bedtime.      . Cholecalciferol (VITAMIN D3 SUPER STRENGTH) 2000 UNITS TABS Take by mouth daily.     . clindamycin (CLEOCIN T) 1 % lotion Apply 1 application topically daily.    . cloNIDine (CATAPRES) 0.1 MG tablet TAKE 1 TABLET BY MOUTH 2 TIMES DAILY 60 tablet 11  . denosumab (PROLIA) 60 MG/ML SOLN injection Inject 60 mg into the skin every 6 (six) months. Administer in upper arm, thigh, or abdomen    . diphenoxylate-atropine (LOMOTIL) 2.5-0.025 MG per tablet Take 1 tablet by mouth 4 (four) times daily as needed for diarrhea or loose stools. 60 tablet 1  . NASCOBAL 500 MCG/0.1ML SOLN USE 1 SPRAY (0.1 ML/500 MCG TOTAL) INTO THE NOSE ONCE A WEEK 3 Bottle 3  . RABEprazole (ACIPHEX) 20 MG tablet TAKE 1 TABLET DAILY 90 tablet 3  . timolol (TIMOPTIC-XR) 0.5 % ophthalmic gel-forming Place 1 drop into both eyes 2 (two) times daily.      No facility-administered medications prior to visit.    ROS Review of Systems  Constitutional: Negative for chills, activity change, appetite change, fatigue and unexpected weight change.  HENT: Negative for congestion, mouth sores and sinus pressure.   Eyes: Negative for visual disturbance.  Respiratory: Negative for cough and chest tightness.   Gastrointestinal: Positive for diarrhea. Negative for nausea, abdominal pain, blood in stool and abdominal distention.  Genitourinary: Negative for frequency, difficulty urinating and vaginal pain.  Musculoskeletal: Negative for back pain and gait problem.  Skin: Negative for pallor and rash.    Neurological: Negative for dizziness, tremors, weakness, numbness and headaches.  Psychiatric/Behavioral: Negative for suicidal ideas, confusion and sleep disturbance.    Objective:  BP 140/70 mmHg  Pulse 94  Wt 193 lb (87.544 kg)  SpO2 96%  BP Readings from Last 3 Encounters:  05/15/15 140/70  03/26/15 160/63  12/22/14 142/90    Wt Readings from Last 3 Encounters:  05/15/15 193 lb (87.544 kg)  03/26/15 190 lb 4.8 oz (86.32 kg)  12/22/14 189 lb 1.9 oz (85.784 kg)    Physical Exam  Constitutional: She appears well-developed. No distress.  Obese  HENT:  Head: Normocephalic.  Right Ear: External ear normal.  Left Ear: External ear normal.  Nose: Nose normal.  Mouth/Throat: Oropharynx is clear and moist.  Eyes: Conjunctivae are normal. Pupils are equal, round, and reactive to light. Right eye exhibits no discharge. Left eye exhibits no discharge.  Neck: Normal range of motion. Neck supple. No JVD present. No tracheal deviation present. No thyromegaly present.  Cardiovascular: Normal rate, regular rhythm and normal heart sounds.   Pulmonary/Chest: No stridor. No respiratory distress. She has no wheezes.  Abdominal: Soft. Bowel sounds are normal. She exhibits no distension and no mass. There is no tenderness. There is no rebound and no guarding.  Musculoskeletal: She exhibits no edema or tenderness.  Lymphadenopathy:    She has no cervical adenopathy.  Neurological: She displays normal reflexes. No cranial nerve deficit. She exhibits normal muscle tone. Coordination  normal.  Skin: No rash noted. No erythema.  Psychiatric: She has a normal mood and affect. Her behavior is normal. Judgment and thought content normal.    Lab Results  Component Value Date   WBC 6.8 03/26/2015   HGB 12.3 03/26/2015   HCT 35.9 03/26/2015   PLT 233 03/26/2015   GLUCOSE 102* 11/12/2014   CHOL 171 11/12/2014   TRIG 141.0 11/12/2014   HDL 47.00 11/12/2014   LDLCALC 96 11/12/2014   ALT 18  11/12/2014   AST 17 11/12/2014   NA 140 11/12/2014   K 3.9 11/12/2014   CL 105 11/12/2014   CREATININE 0.78 11/12/2014   BUN 16 11/12/2014   CO2 29 11/12/2014   TSH 1.55 11/12/2014   HGBA1C 5.8 05/27/2014    Mm Screening Breast Tomo Bilateral  12/23/2014   CLINICAL DATA:  Screening.  EXAM: DIGITAL SCREENING BILATERAL MAMMOGRAM WITH 3D TOMO WITH CAD  COMPARISON:  Previous exam(s).  ACR Breast Density Category c: The breast tissue is heterogeneously dense, which may obscure small masses.  FINDINGS: There are no findings suspicious for malignancy. Images were processed with CAD.  IMPRESSION: No mammographic evidence of malignancy. A result letter of this screening mammogram will be mailed directly to the patient.  RECOMMENDATION: Screening mammogram in one year. (Code:SM-B-01Y)  BI-RADS CATEGORY  1: Negative.   Electronically Signed   By: Lajean Manes M.D.   On: 12/23/2014 15:41    Assessment & Plan:   There are no diagnoses linked to this encounter. I am having Ms. Lauritsen maintain her aspirin, Cholecalciferol, bimatoprost, timolol, denosumab, diphenoxylate-atropine, NASCOBAL, RABEprazole, cloNIDine, and clindamycin.  No orders of the defined types were placed in this encounter.     Follow-up: No Follow-up on file.  Walker Kehr, MD

## 2015-05-15 NOTE — Assessment & Plan Note (Signed)
On Catapress (per Dr Ubaldo Glassing) - increase to bid

## 2015-05-15 NOTE — Assessment & Plan Note (Signed)
Re-start Welchol

## 2015-05-16 IMAGING — CT CT ABD-PELV W/ CM
2 of 4 series · 14 of 42 positions shown, 18 images · IV contrast (Omnipaque 300)
Comparison: CT abdomen and pelvis 10/11/2013, 04/07/2009. PET-CT
04/14/2009.

CLINICAL DATA: Three week history of left lower quadrant abdominal
pain. Prior history of treated ovarian cancer with omental
involvement at the time of diagnosis in 7040. Surgical history
includes TAH BSO.

EXAM:
CT ABDOMEN AND PELVIS WITH CONTRAST
TECHNIQUE: Multidetector CT imaging of the abdomen and pelvis was performed
using the standard protocol following bolus administration of
intravenous contrast.
CONTRAST:  100mL OMNIPAQUE IOHEXOL 300 MG/ML IV. Oral contrast was
also administered.

[Series 2: abd/ pel 5mm · axial · 0.76mm/px · z∈[+849,+1264]mm · 11 of 98 slices shown, 15 images]
[im 10/98  soft-tissue]
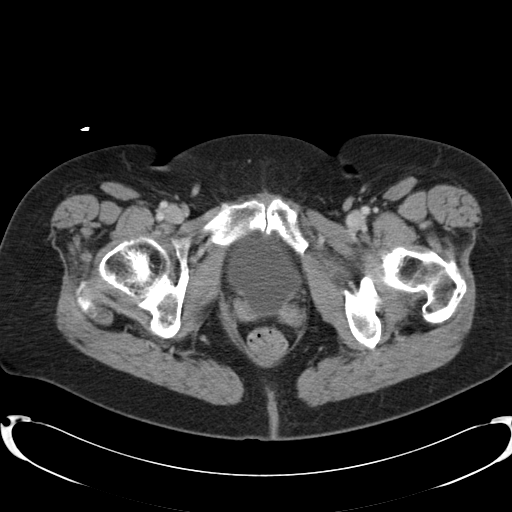
[im 10/98  bone]
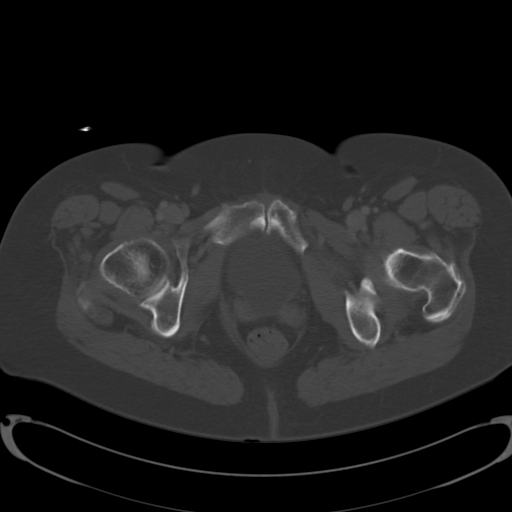
[im 20/98  soft-tissue]
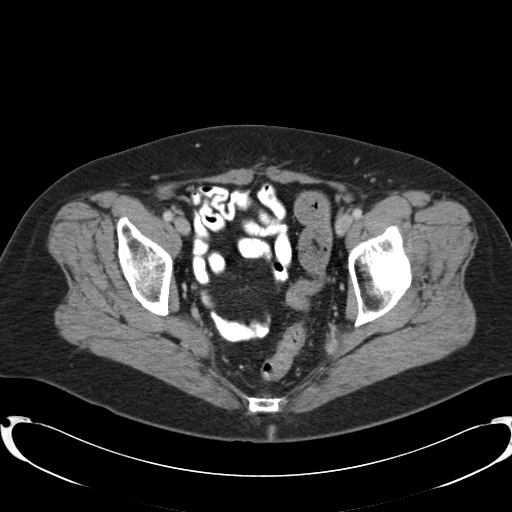
[im 30/98  soft-tissue]
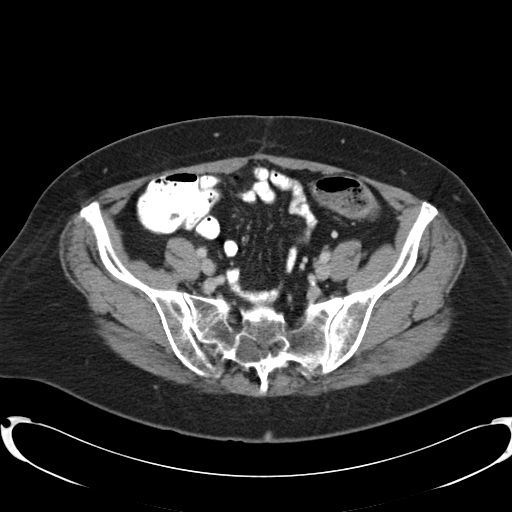
[im 39/98  soft-tissue]
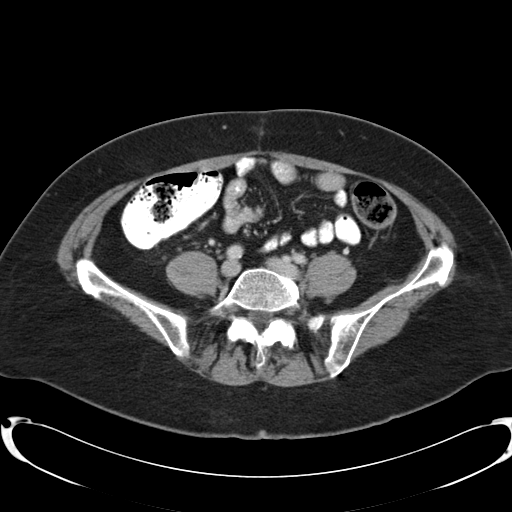
[im 49/98  soft-tissue]
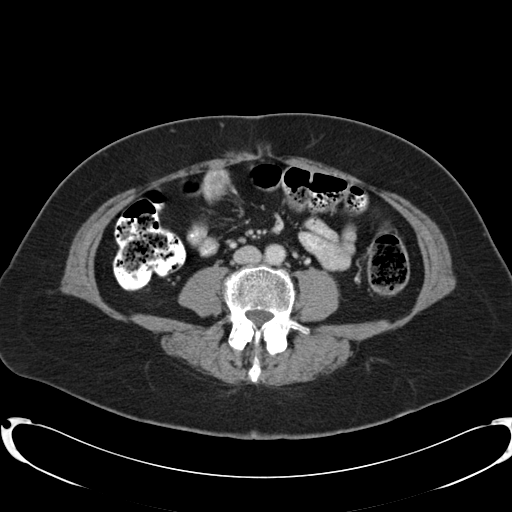
[im 59/98  soft-tissue]
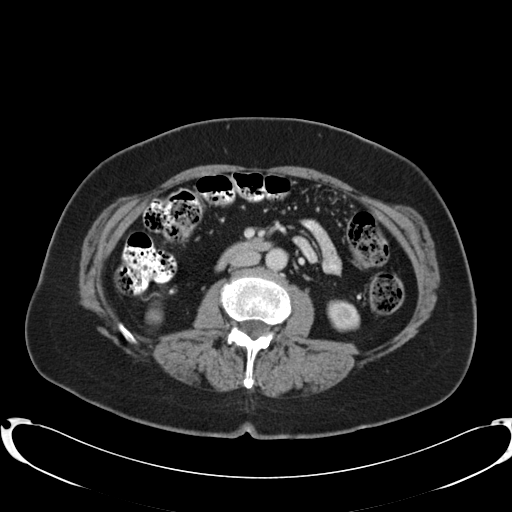
[im 68/98  soft-tissue]
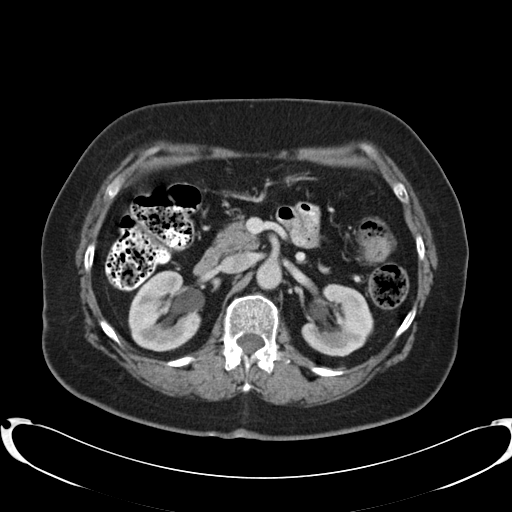
[im 78/98  soft-tissue]
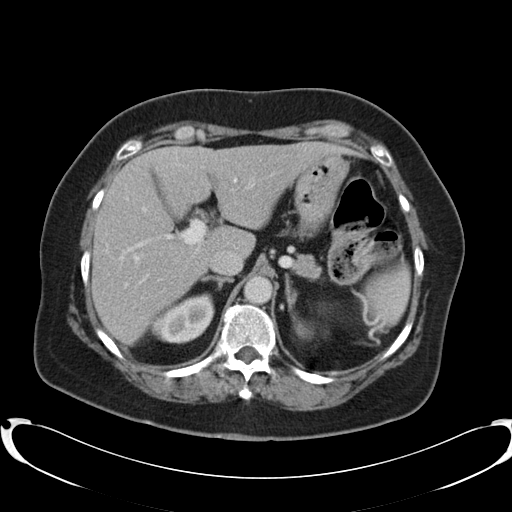
[im 78/98  lung]
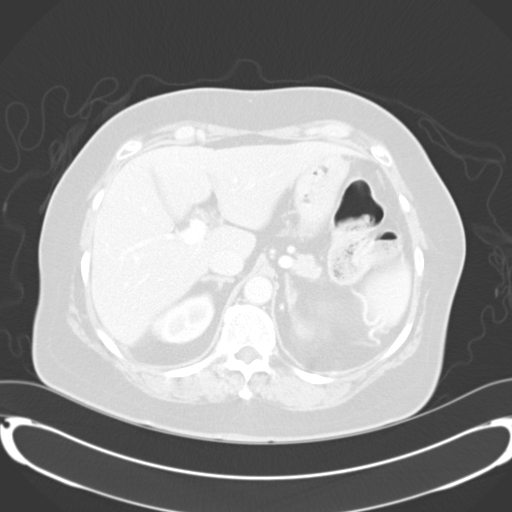
[im 83/98  lung]
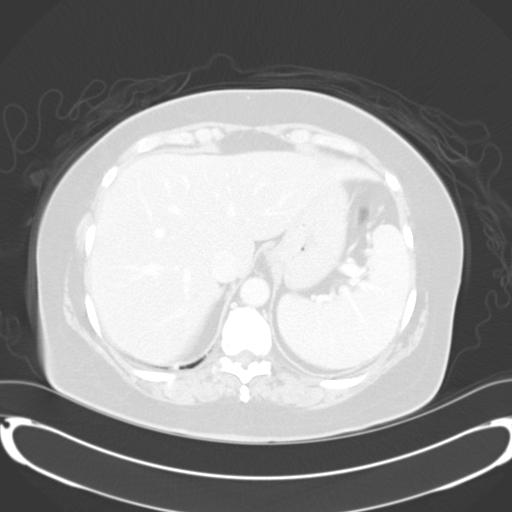
[im 88/98  soft-tissue]
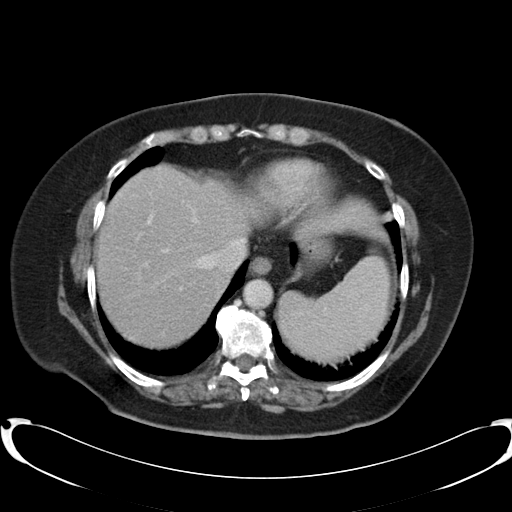
[im 88/98  lung]
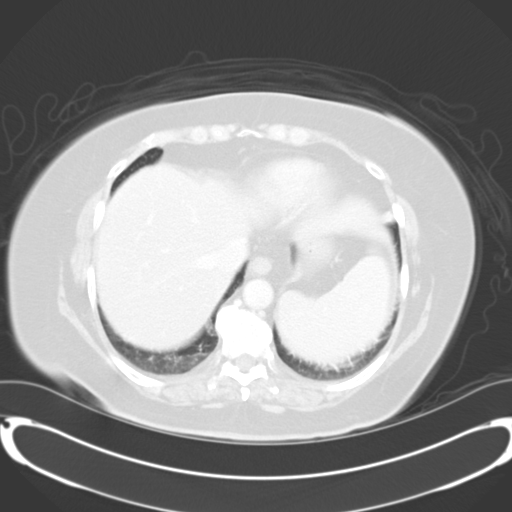
[im 88/98  bone]
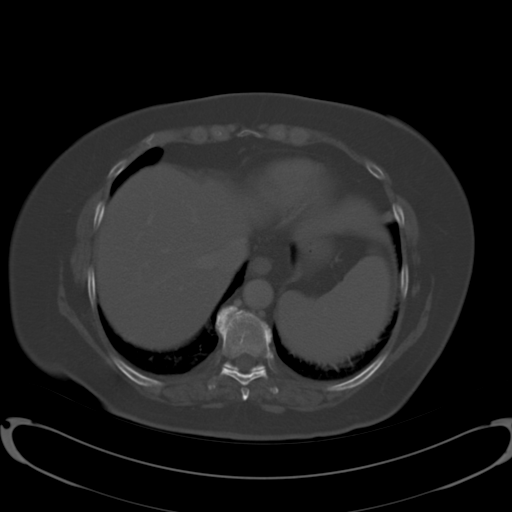
[im 93/98  lung]
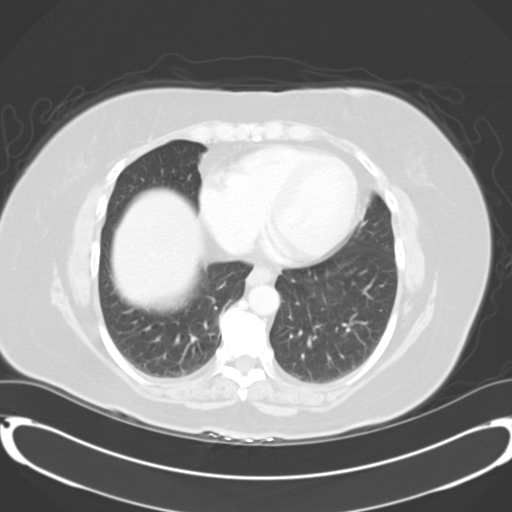

[Series 602: <mpr range> · coronal · 0.98mm/px · 3 of 132 slices shown]
[im 44/132  soft-tissue]
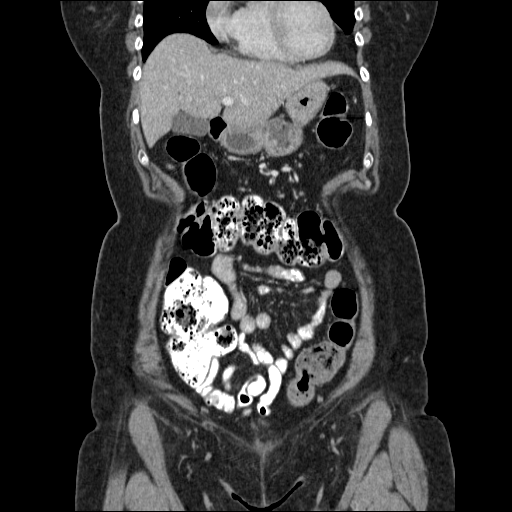
[im 59/132  soft-tissue]
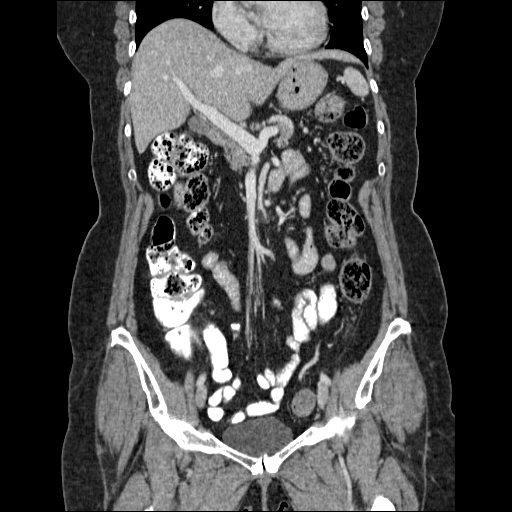
[im 73/132  soft-tissue]
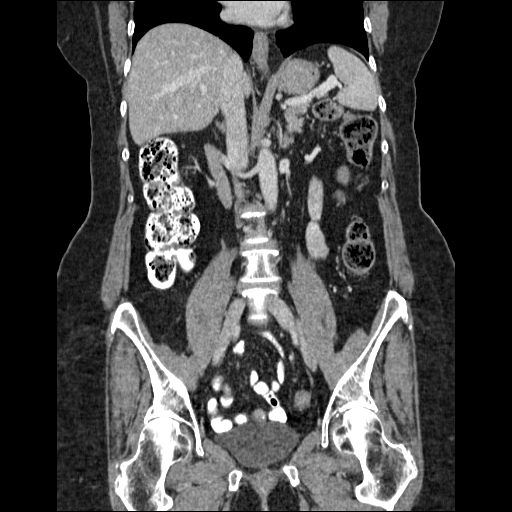

[14 of 42 positions shown; findings below may reference images not displayed]

FINDINGS: No evidence of recurrent or metastatic ovarian cancer.

Normal appearing liver, spleen, adrenal glands, kidneys, and
gallbladder. No biliary ductal dilation. Mild atrophy involving the
body of the pancreas, with an approximate 7 mm cyst in the body,
unchanged dating back to 7040. Mild aortoiliofemoral atherosclerosis
without aneurysm. No significant lymphadenopathy.

Stomach decompressed and unremarkable. Normal-appearing small bowel.
Large stool burden throughout the colon. No evidence of colonic
diverticulosis or other focal abnormality. Appendix not visualized
and may be surgically absent. No ascites.

Urinary bladder normal in appearance, though it does extend low into
the perineum. Uterus and ovaries surgically absent. No adnexal
masses or free pelvic fluid.

Bone window images demonstrate osseous demineralization, lower
thoracic spondylosis, diffuse facet degenerative changes throughout
the lumbar spine, and degenerative changes involving the symphysis
pubis. No evidence of osseous metastatic disease. Visualized lung
bases clear apart from the expected dependent atelectasis
posteriorly. Heart upper normal in size to slightly enlarged but
stable.
IMPRESSION: 1. No acute abnormalities involving the abdomen or pelvis.
2. No evidence of recurrent or metastatic ovarian cancer.
3. Large stool burden throughout the colon.
4. Stable 7 mm cyst in the body of the pancreas with stable mild
atrophy of the pancreatic body. This likely represents a benign
intraductal mucinous lesion.
5. Urinary bladder extends very low in the perineum, query laxity of
the pelvic floor muscles.

## 2015-05-18 ENCOUNTER — Telehealth: Payer: Self-pay | Admitting: Internal Medicine

## 2015-05-18 NOTE — Telephone Encounter (Signed)
I have electronically submitted pt's info for [name] verification and will notify you once I have a response. Her next injection has to be six months and one day so it will technically be due Oct. 29th, but since that's a Saturday it will be October 31st.  Thank you.

## 2015-05-20 ENCOUNTER — Other Ambulatory Visit: Payer: Self-pay | Admitting: Internal Medicine

## 2015-05-20 ENCOUNTER — Encounter: Payer: Self-pay | Admitting: Internal Medicine

## 2015-05-20 MED ORDER — ELUXADOLINE 100 MG PO TABS: 100.0000 mg | ORAL_TABLET | Freq: Two times a day (BID) | ORAL | Status: DC

## 2015-05-21 NOTE — Telephone Encounter (Signed)
Pt informed. Nurse visit scheduled 07/08/15 at 10:30. Will have Tamara order Prolia.

## 2015-05-25 NOTE — Telephone Encounter (Signed)
I have rec'd pt's Environmental consultant for Prolia.  Pt's primary insurance, [employer] would cover 80% of Prolia once $166 deductible is met (pt has met), leaving pt w/an estimated responsibility of a 20% co-insurance (approx $180).  However, pt's secondary insurance, [place] says pt is responsible for a $400 deductible (pt has met) and a 10% co-insurance which would be approximately $100 for pt's responsibility.  [employer] also states that if the primary's paid amt is equal to or greater than the amount the [facility] would pay as primary, no additional payment will be made.  Please inform pt this is only an estimate and we will not know an exact amt until both insurances have paid.  I have sent a copy of the summary of benefits to be scanned into her chart.  If [name] cannot afford $100 for her injection, please advise her to contact [phone] and select option #1 to see if she qualifies for one of their assistance programs.  If she qualifies they will instruct her how to proceed.    Also, please let me know her injection date once she rec's it so I can update the Prolia portal.  If you have any questions, please let me know.  Thank you.

## 2015-05-27 DIAGNOSIS — H40013 Open angle with borderline findings, low risk, bilateral: Secondary | ICD-10-CM | POA: Diagnosis not present

## 2015-05-28 NOTE — Telephone Encounter (Signed)
Med is in refrig with patient name on it, patient has scheduled appt per stefannie/cma

## 2015-05-28 NOTE — Telephone Encounter (Signed)
Pt informed.   [name] can you order

## 2015-06-10 ENCOUNTER — Encounter (HOSPITAL_COMMUNITY): Payer: Self-pay

## 2015-06-29 ENCOUNTER — Telehealth: Payer: Self-pay | Admitting: Internal Medicine

## 2015-06-29 ENCOUNTER — Other Ambulatory Visit: Payer: Self-pay | Admitting: Internal Medicine

## 2015-06-29 NOTE — Telephone Encounter (Signed)
Patient husband called in to advise that [facility] had the request for NASCOBAL 500 MCG/0.1ML SOLN [[id number]] in the wrong name (under his name not hers). He states that they will be sending an updated script request with her name on there correctly.

## 2015-07-08 ENCOUNTER — Ambulatory Visit (INDEPENDENT_AMBULATORY_CARE_PROVIDER_SITE_OTHER): Payer: Medicare Other

## 2015-07-08 ENCOUNTER — Other Ambulatory Visit: Payer: Self-pay

## 2015-07-08 DIAGNOSIS — M899 Disorder of bone, unspecified: Secondary | ICD-10-CM

## 2015-07-08 DIAGNOSIS — M8580 Other specified disorders of bone density and structure, unspecified site: Secondary | ICD-10-CM

## 2015-07-08 MED ORDER — DENOSUMAB 60 MG/ML ~~LOC~~ SOLN
60.0000 mg | Freq: Once | SUBCUTANEOUS | Status: AC
  Administered 2015-07-08: 60 mg via SUBCUTANEOUS

## 2015-08-14 ENCOUNTER — Telehealth: Payer: Self-pay | Admitting: Oncology

## 2015-08-14 NOTE — Telephone Encounter (Signed)
Lvm advising appts 7/17 lab and 7/24 MD. Mailed calendar.

## 2015-09-18 ENCOUNTER — Ambulatory Visit: Payer: Medicare Other | Attending: Gynecology | Admitting: Gynecology

## 2015-09-18 ENCOUNTER — Encounter: Payer: Self-pay | Admitting: Gynecology

## 2015-09-18 VITALS — BP 144/60 | HR 89 | Temp 97.6°F | Resp 19 | Wt 195.5 lb

## 2015-09-18 DIAGNOSIS — C5700 Malignant neoplasm of unspecified fallopian tube: Secondary | ICD-10-CM | POA: Insufficient documentation

## 2015-09-18 DIAGNOSIS — N8110 Cystocele, unspecified: Secondary | ICD-10-CM | POA: Diagnosis not present

## 2015-09-18 DIAGNOSIS — Z8544 Personal history of malignant neoplasm of other female genital organs: Secondary | ICD-10-CM

## 2015-09-18 DIAGNOSIS — N816 Rectocele: Secondary | ICD-10-CM

## 2015-09-18 NOTE — Patient Instructions (Signed)
Return in one year.

## 2015-09-18 NOTE — Progress Notes (Signed)
Consult Note: Gyn-Onc   [name] 70 y.o. female  Chief Complaint  Patient presents with  . FALLOPIAN TUBE CANCER    FOLLOW UP   Assessment: Stage III fallopian tube cancer 2010. Clinically free of disease.  Mildly symptomatic cystocele and rectocele.  Plan the patient will see Dr. Marko Plume in 6 months and return to see [place] in one year. We'll continue to monitor CA 125 values.  Interval History Since her last visit the patient has done well. She has had continuing difficulties over the past several months with diarrhea followed by constipation.  She is under the care of Dr. Olevia Perches for this problem.Otherwise she has no other GI or GU symptoms. Functional status is excellent. She denies any breast symptoms. She scheduled to have mammograms next month.   HPI: Stage III fallopian tube carcinoma undergoing initial surgical debulking February 2010. She was optimally debulked with minimal disease on peritoneal surfaces. She received 6 cycles of carboplatin and Taxol chemotherapy completed in June 2010. He CT scan and a PET scan at that time showed no evidence disease. The patient has been followed since that time with normal exams and CA 125 values.    Review of Systems:10 point review of systems is negative as noted above.   Vitals: Blood pressure 144/60, pulse 89, temperature 97.6 F (36.4 C), temperature source Oral, resp. rate 19, weight 195 lb 8 oz (88.678 kg), SpO2 97 %.  Physical Exam: General : The patient is a healthy woman in no acute distress.  HEENT: normocephalic, extraoccular movements normal; neck is supple without thyromegally  Lynphnodes: Supraclavicular, axillary and inguinal nodes not enlarged   Breasts are without masses skin changes or nipple discharge. Abdomen: Soft, non-tender, no ascites, no organomegally, no masses, no hernias  Pelvic:  EGBUS: Normal female  Vagina: Patient has a grade 1-2 cystocele and rectocele. Urethra and Bladder: Normal, non-tender,  cystocele.  Cervix: Surgically absent  Uterus: Surgically absent  Bi-manual examination: Non-tender; no adenxal masses or nodularity  Rectal: normal sphincter tone, no masses, no blood  Lower extremities: No edema or varicosities. Normal range of motion     Allergies  Allergen Reactions  . Actonel [Risedronate Sodium]     Upset stomach  . Boniva [Ibandronate Sodium]     cramp  . Calcium Channel Blockers     Upset stomach  . Compazine     Daughter reacts to compazine/pt does not want to take  . Lyrica [Pregabalin]     Dizzy     Past Medical History  Diagnosis Date  . HTN (hypertension)   . LBP (low back pain)   . Menopause   . Vitamin B12 deficiency   . IBS (irritable bowel syndrome)   . Vitamin D deficiency   . GERD (gastroesophageal reflux disease)   . Ovarian cancer ([place]) 09/2008    Dr Gwyneth Revels  . Glaucoma (increased eye pressure)   . Pancreatic cyst   . Adenomatous colon polyp     Past Surgical History  Procedure Laterality Date  . Ovarian cancer debulking  09/2008  . Hemorrhoid surgery  2001    Current Outpatient Prescriptions  Medication Sig Dispense Refill  . aspirin 81 MG EC tablet Take 81 mg by mouth daily.      . bimatoprost (LUMIGAN) 0.03 % ophthalmic solution Place 1 drop into both eyes at bedtime.      . Cholecalciferol (VITAMIN D3 SUPER STRENGTH) 2000 UNITS TABS Take 2 tablets (4,000 Units total) by mouth every morning.  100 each 3  . cloNIDine (CATAPRES) 0.1 MG tablet TAKE 1 TABLET BY MOUTH 2 TIMES DAILY 60 tablet 11  . denosumab (PROLIA) 60 MG/ML SOLN injection Inject 60 mg into the skin every 6 (six) months. Administer in upper arm, thigh, or abdomen    . diphenoxylate-atropine (LOMOTIL) 2.5-0.025 MG per tablet Take 1 tablet by mouth 4 (four) times daily as needed for diarrhea or loose stools. 60 tablet 1  . NASCOBAL 500 MCG/0.1ML SOLN USE ONE SPRAY INTO THE NOSE ONCE A WEEK 12 mL 2  . potassium chloride (KLOR-CON) 8 MEQ tablet Take 1 tablet (8 mEq  total) by mouth daily. 90 tablet 3  . RABEprazole (ACIPHEX) 20 MG tablet TAKE 1 TABLET DAILY 90 tablet 3  . timolol (TIMOPTIC-XR) 0.5 % ophthalmic gel-forming Place 1 drop into both eyes 2 (two) times daily.     . clindamycin (CLEOCIN T) 1 % lotion Apply 1 application topically daily. Reported on 09/18/2015     No current facility-administered medications for this visit.    Social History   Social History  . Marital Status: Married    Spouse Name: Dominica Severin  . Number of Children: 2  . Years of Education: N/A   Occupational History  . Homemaker    Social History Main Topics  . Smoking status: Never Smoker   . Smokeless tobacco: Never Used  . Alcohol Use: No  . Drug Use: No  . Sexual Activity: Not on file   Other Topics Concern  . Not on file   Social History Narrative    Family History  Problem Relation Age of Onset  . Alzheimer's disease Mother 52  . Other Mother 25    TAH for excessive bleeding  . Lung cancer Father     smoker; metastasis to stomach and other areas  . Heart attack Maternal Uncle   . Other Paternal Aunt     stomach issues  . Heart Problems Paternal Uncle   . Other Maternal Grandmother     stomach issues; +hysterectomy  . Heart attack Maternal Grandfather   . Infertility Daughter   . Stomach cancer Cousin     dx. mid-60s  . Other Cousin     stomach issues  . Leukemia Cousin 18  . Stomach cancer Other   . Cancer Cousin     unknown type  . Other Cousin     stomach issues  . Heart Problems Maternal Uncle   . Diabetes Paternal Aunt   . Heart Problems Paternal Uncle   . Emphysema Paternal Uncle     work exposure  . Breast cancer Cousin     dx. late 60s-early 70s      Alvino Chapel, MD 09/18/2015, 2:02 PM

## 2015-09-24 ENCOUNTER — Encounter: Payer: Self-pay | Admitting: Gastroenterology

## 2015-09-28 DIAGNOSIS — E113292 Type 2 diabetes mellitus with mild nonproliferative diabetic retinopathy without macular edema, left eye: Secondary | ICD-10-CM | POA: Diagnosis not present

## 2015-09-28 DIAGNOSIS — H04213 Epiphora due to excess lacrimation, bilateral lacrimal glands: Secondary | ICD-10-CM | POA: Diagnosis not present

## 2015-09-28 DIAGNOSIS — H40012 Open angle with borderline findings, low risk, left eye: Secondary | ICD-10-CM | POA: Diagnosis not present

## 2015-09-28 DIAGNOSIS — H40011 Open angle with borderline findings, low risk, right eye: Secondary | ICD-10-CM | POA: Diagnosis not present

## 2015-09-28 LAB — HM DIABETES EYE EXAM

## 2015-10-01 ENCOUNTER — Encounter: Payer: Self-pay | Admitting: Internal Medicine

## 2015-10-20 ENCOUNTER — Telehealth: Payer: Self-pay | Admitting: Internal Medicine

## 2015-10-20 ENCOUNTER — Encounter: Payer: Self-pay | Admitting: Internal Medicine

## 2015-10-20 ENCOUNTER — Ambulatory Visit (INDEPENDENT_AMBULATORY_CARE_PROVIDER_SITE_OTHER): Payer: Medicare Other | Admitting: Internal Medicine

## 2015-10-20 ENCOUNTER — Other Ambulatory Visit (INDEPENDENT_AMBULATORY_CARE_PROVIDER_SITE_OTHER): Payer: Medicare Other

## 2015-10-20 VITALS — BP 150/84 | HR 88 | Wt 192.0 lb

## 2015-10-20 DIAGNOSIS — E785 Hyperlipidemia, unspecified: Secondary | ICD-10-CM

## 2015-10-20 DIAGNOSIS — K921 Melena: Secondary | ICD-10-CM

## 2015-10-20 DIAGNOSIS — R739 Hyperglycemia, unspecified: Secondary | ICD-10-CM

## 2015-10-20 DIAGNOSIS — Z0000 Encounter for general adult medical examination without abnormal findings: Secondary | ICD-10-CM

## 2015-10-20 LAB — BASIC METABOLIC PANEL
BUN: 12 mg/dL (ref 6–23)
CHLORIDE: 106 meq/L (ref 96–112)
CO2: 26 mEq/L (ref 19–32)
Calcium: 9.1 mg/dL (ref 8.4–10.5)
Creatinine, Ser: 0.76 mg/dL (ref 0.40–1.20)
GFR: 80 mL/min (ref >60.00)
Glucose, Bld: 95 mg/dL (ref 70–99)
POTASSIUM: 3.5 meq/L (ref 3.5–5.1)
SODIUM: 142 meq/L (ref 135–145)

## 2015-10-20 LAB — CBC WITH DIFFERENTIAL/PLATELET
Basophils Absolute: 0 10*3/uL (ref 0.0–0.1)
Basophils Relative: 0.4 % (ref 0.0–3.0)
EOS ABS: 0.1 10*3/uL (ref 0.0–0.7)
Eosinophils Relative: 1 % (ref 0.0–5.0)
HCT: 35.5 % — ABNORMAL LOW (ref 36.0–46.0)
Hemoglobin: 12.3 g/dL (ref 12.0–15.0)
LYMPHS PCT: 28.2 % (ref 12.0–46.0)
Lymphs Abs: 1.7 10*3/uL (ref 0.7–4.0)
MCHC: 34.6 g/dL (ref 30.0–36.0)
MCV: 82.6 fl (ref 78.0–100.0)
MONO ABS: 0.4 10*3/uL (ref 0.1–1.0)
Monocytes Relative: 6.9 % (ref 3.0–12.0)
Neutro Abs: 3.9 10*3/uL (ref 1.4–7.7)
Neutrophils Relative %: 63.5 % (ref 43.0–77.0)
PLATELETS: 222 10*3/uL (ref 150.0–400.0)
RBC: 4.3 Mil/uL (ref 3.87–5.11)
RDW: 14 % (ref 11.5–15.5)
WBC: 6.1 10*3/uL (ref 4.0–10.5)

## 2015-10-20 LAB — URINALYSIS, ROUTINE W REFLEX MICROSCOPIC
Bilirubin Urine: NEGATIVE
Ketones, ur: 15 — AB
NITRITE: NEGATIVE
Specific Gravity, Urine: 1.02 (ref 1.000–1.030)
Total Protein, Urine: NEGATIVE
URINE GLUCOSE: NEGATIVE
Urobilinogen, UA: 0.2 (ref 0.0–1.0)
pH: 6 (ref 5.0–8.0)

## 2015-10-20 LAB — HEPATIC FUNCTION PANEL
ALBUMIN: 4.4 g/dL (ref 3.5–5.2)
ALT: 28 U/L (ref 0–35)
AST: 27 U/L (ref 0–37)
Alkaline Phosphatase: 62 U/L (ref 39–117)
BILIRUBIN DIRECT: 0.1 mg/dL (ref 0.0–0.3)
BILIRUBIN TOTAL: 0.7 mg/dL (ref 0.2–1.2)
Total Protein: 7.6 g/dL (ref 6.0–8.3)

## 2015-10-20 LAB — HEMOGLOBIN A1C: HEMOGLOBIN A1C: 6 % (ref 4.6–6.5)

## 2015-10-20 LAB — LIPID PANEL
CHOL/HDL RATIO: 4
Cholesterol: 148 mg/dL (ref 0–200)
HDL: 33.3 mg/dL — AB (ref >39.00)
LDL CALC: 84 mg/dL (ref 0–99)
NonHDL: 115.02
Triglycerides: 155 mg/dL — ABNORMAL HIGH (ref 0.0–149.0)
VLDL: 31 mg/dL (ref 0.0–40.0)

## 2015-10-20 LAB — TSH: TSH: 0.91 u[IU]/mL (ref 0.35–4.50)

## 2015-10-20 NOTE — Progress Notes (Signed)
Pre visit review using our clinic review tool, if applicable. No additional management support is needed unless otherwise documented below in the visit note. 

## 2015-10-20 NOTE — Assessment & Plan Note (Addendum)
2/17 H/o polyps - colon due 2017 Will sch

## 2015-10-20 NOTE — Progress Notes (Signed)
Subjective:  Patient ID: [name], female    DOB: 1946/03/13  Age: 70 y.o. MRN: [id number]  CC: No chief complaint on file.   HPI [name] presents for DM2, diarrhea Pt had blood in the stool last Wed - it was in the commode and on paper - no pain  Dr Satira Sark told the pt that she has a diabetic retinopathy - mild  Outpatient Prescriptions Prior to Visit  Medication Sig Dispense Refill  . aspirin 81 MG EC tablet Take 81 mg by mouth daily.      . bimatoprost (LUMIGAN) 0.03 % ophthalmic solution Place 1 drop into both eyes at bedtime.      . Cholecalciferol (VITAMIN D3 SUPER STRENGTH) 2000 UNITS TABS Take 2 tablets (4,000 Units total) by mouth every morning. 100 each 3  . clindamycin (CLEOCIN T) 1 % lotion Apply 1 application topically daily. Reported on 09/18/2015    . cloNIDine (CATAPRES) 0.1 MG tablet TAKE 1 TABLET BY MOUTH 2 TIMES DAILY 60 tablet 11  . denosumab (PROLIA) 60 MG/ML SOLN injection Inject 60 mg into the skin every 6 (six) months. Administer in upper arm, thigh, or abdomen    . diphenoxylate-atropine (LOMOTIL) 2.5-0.025 MG per tablet Take 1 tablet by mouth 4 (four) times daily as needed for diarrhea or loose stools. 60 tablet 1  . NASCOBAL 500 MCG/0.1ML SOLN USE ONE SPRAY INTO THE NOSE ONCE A WEEK 12 mL 2  . potassium chloride (KLOR-CON) 8 MEQ tablet Take 1 tablet (8 mEq total) by mouth daily. 90 tablet 3  . RABEprazole (ACIPHEX) 20 MG tablet TAKE 1 TABLET DAILY 90 tablet 3  . timolol (TIMOPTIC-XR) 0.5 % ophthalmic gel-forming Place 1 drop into both eyes 2 (two) times daily.      No facility-administered medications prior to visit.    ROS Review of Systems  Constitutional: Negative for chills, activity change, appetite change, fatigue and unexpected weight change.  HENT: Negative for congestion, mouth sores and sinus pressure.   Eyes: Negative for visual disturbance.  Respiratory: Negative for cough and chest tightness.   Cardiovascular: Negative for  leg swelling.  Gastrointestinal: Positive for diarrhea and blood in stool. Negative for nausea, vomiting, abdominal pain and rectal pain.  Genitourinary: Negative for frequency, difficulty urinating and vaginal pain.  Musculoskeletal: Negative for back pain and gait problem.  Skin: Negative for pallor and rash.  Neurological: Negative for dizziness, tremors, weakness, numbness and headaches.  Psychiatric/Behavioral: Negative for suicidal ideas, confusion and sleep disturbance. The patient is nervous/anxious.     Objective:  BP 150/84 mmHg  Pulse 88  Wt 192 lb (87.091 kg)  SpO2 96%  BP Readings from Last 3 Encounters:  10/20/15 150/84  09/18/15 144/60  05/15/15 140/70    Wt Readings from Last 3 Encounters:  10/20/15 192 lb (87.091 kg)  09/18/15 195 lb 8 oz (88.678 kg)  05/15/15 193 lb (87.544 kg)    Physical Exam  Constitutional: She appears well-developed. No distress.  HENT:  Head: Normocephalic.  Right Ear: External ear normal.  Left Ear: External ear normal.  Nose: Nose normal.  Mouth/Throat: Oropharynx is clear and moist.  Eyes: Conjunctivae are normal. Pupils are equal, round, and reactive to light. Right eye exhibits no discharge. Left eye exhibits no discharge.  Neck: Normal range of motion. Neck supple. No JVD present. No tracheal deviation present. No thyromegaly present.  Cardiovascular: Normal rate, regular rhythm and normal heart sounds.   Pulmonary/Chest: No stridor. No respiratory distress.  She has no wheezes.  Abdominal: Soft. Bowel sounds are normal. She exhibits no distension and no mass. There is no tenderness. There is no rebound and no guarding.  Musculoskeletal: She exhibits no edema or tenderness.  Lymphadenopathy:    She has no cervical adenopathy.  Neurological: She displays normal reflexes. No cranial nerve deficit. She exhibits normal muscle tone. Coordination normal.  Skin: No rash noted. No erythema.  Psychiatric: She has a normal mood and  affect. Her behavior is normal. Judgment and thought content normal.    Lab Results  Component Value Date   WBC 6.8 03/26/2015   HGB 12.3 03/26/2015   HCT 35.9 03/26/2015   PLT 233 03/26/2015   GLUCOSE 86 05/15/2015   CHOL 171 11/12/2014   TRIG 141.0 11/12/2014   HDL 47.00 11/12/2014   LDLCALC 96 11/12/2014   ALT 19 05/15/2015   AST 20 05/15/2015   NA 143 05/15/2015   K 3.4* 05/15/2015   CL 106 05/15/2015   CREATININE 0.81 05/15/2015   BUN 13 05/15/2015   CO2 28 05/15/2015   TSH 1.55 11/12/2014   HGBA1C 5.9 05/15/2015    Mm Screening Breast Tomo Bilateral  12/23/2014  CLINICAL DATA:  Screening. EXAM: DIGITAL SCREENING BILATERAL MAMMOGRAM WITH 3D TOMO WITH CAD COMPARISON:  Previous exam(s). ACR Breast Density Category c: The breast tissue is heterogeneously dense, which may obscure small masses. FINDINGS: There are no findings suspicious for malignancy. Images were processed with CAD. IMPRESSION: No mammographic evidence of malignancy. A result letter of this screening mammogram will be mailed directly to the patient. RECOMMENDATION: Screening mammogram in one year. (Code:SM-B-01Y) BI-RADS CATEGORY  1: Negative. Electronically Signed   By: Lajean Manes M.D.   On: 12/23/2014 15:41    Assessment & Plan:   There are no diagnoses linked to this encounter. I am having [name] maintain her aspirin, bimatoprost, timolol, denosumab, RABEprazole, cloNIDine, clindamycin, diphenoxylate-atropine, Cholecalciferol, potassium chloride, and NASCOBAL.  No orders of the defined types were placed in this encounter.     Follow-up: No Follow-up on file.  Walker Kehr, MD

## 2015-10-20 NOTE — Assessment & Plan Note (Signed)
Mild  Dr Satira Sark told the pt that she has a diabetic retinopathy - mild

## 2015-10-20 NOTE — Telephone Encounter (Signed)
Pt came back in after her visit today and wanted to make sure her pharmacy information is updated for this year to [facility].

## 2015-10-21 NOTE — Telephone Encounter (Signed)
Notified pt pharmacy's updated...[name]

## 2015-10-23 ENCOUNTER — Ambulatory Visit (INDEPENDENT_AMBULATORY_CARE_PROVIDER_SITE_OTHER): Payer: Medicare Other | Admitting: Endocrinology

## 2015-10-23 ENCOUNTER — Encounter: Payer: Self-pay | Admitting: Endocrinology

## 2015-10-23 VITALS — BP 126/86 | HR 84 | Temp 98.0°F | Resp 16 | Ht 63.5 in | Wt 191.0 lb

## 2015-10-23 DIAGNOSIS — R7303 Prediabetes: Secondary | ICD-10-CM | POA: Diagnosis not present

## 2015-10-23 NOTE — Progress Notes (Signed)
Patient ID: [name], female   DOB: Apr 04, 1946, 70 y.o.   MRN: [id number]           Reason for Appointment: Consultation for ?  Type 2 Diabetes  Referring physician: Plotnickov  History of Present Illness:          Patient was seen by her ophthalmologist recently and was told to have background retinopathy for the first time She does go to a ophthalmologist regularly for follow-up of glaucoma suspect and other issues She had not been diagnosed to have diabetes No prior history of gestational diabetes  On her periodic general checkups she has had an A1c done regularly and is having persistently upper normal but no higher than 6%, the highest level being in 10/2015 Her glucose levels have been consistently below 100 except once when in nonfasting glucose was 102       Self-care: She does not follow any particular diet and will drink regular soft drinks at this [employer] fairly regularly               Exercise: None currently     Weight history:  Wt Readings from Last 3 Encounters:  10/23/15 191 lb (86.637 kg)  10/20/15 192 lb (87.091 kg)  09/18/15 195 lb 8 oz (88.678 kg)    Glycemic levels:   Lab Results  Component Value Date   HGBA1C 6.0 10/20/2015   HGBA1C 5.9 05/15/2015   HGBA1C 5.8 05/27/2014   Lab Results  Component Value Date   LDLCALC 84 10/20/2015   CREATININE 0.76 10/20/2015         Medication List       This list is accurate as of: 10/23/15 11:59 PM.  Always use your most recent med list.               aspirin 81 MG EC tablet  Take 81 mg by mouth daily.     bimatoprost 0.03 % ophthalmic solution  Commonly known as:  LUMIGAN  Place 1 drop into both eyes at bedtime.     Cholecalciferol 2000 units Tabs  Commonly known as:  VITAMIN D3 SUPER STRENGTH  Take 2 tablets (4,000 Units total) by mouth every morning.     clindamycin 1 % lotion  Commonly known as:  CLEOCIN T  Apply 1 application topically daily. Reported on 09/18/2015     cloNIDine  0.1 MG tablet  Commonly known as:  CATAPRES  TAKE 1 TABLET BY MOUTH 2 TIMES DAILY     denosumab 60 MG/ML Soln injection  Commonly known as:  PROLIA  Inject 60 mg into the skin every 6 (six) months. Administer in upper arm, thigh, or abdomen     diphenoxylate-atropine 2.5-0.025 MG tablet  Commonly known as:  LOMOTIL  Take 1 tablet by mouth 4 (four) times daily as needed for diarrhea or loose stools.     NASCOBAL 500 MCG/0.1ML Soln  Generic drug:  Cyanocobalamin  USE ONE SPRAY INTO THE NOSE ONCE A WEEK     potassium chloride 8 MEQ tablet  Commonly known as:  KLOR-CON  Take 1 tablet (8 mEq total) by mouth daily.     RABEprazole 20 MG tablet  Commonly known as:  ACIPHEX  TAKE 1 TABLET DAILY     timolol 0.5 % ophthalmic gel-forming  Commonly known as:  TIMOPTIC-XR  Place 1 drop into both eyes 2 (two) times daily.        Allergies:  Allergies  Allergen Reactions  . Actonel [  Risedronate Sodium]     Upset stomach  . Boniva [Ibandronate Sodium]     cramp  . Calcium Channel Blockers     Upset stomach  . Compazine     Daughter reacts to compazine/pt does not want to take  . Lyrica [Pregabalin]     Dizzy     Past Medical History  Diagnosis Date  . HTN (hypertension)   . LBP (low back pain)   . Menopause   . Vitamin B12 deficiency   . IBS (irritable bowel syndrome)   . Vitamin D deficiency   . GERD (gastroesophageal reflux disease)   . Ovarian cancer (HCC) 09/2008    Dr Janae Sauce  . Glaucoma (increased eye pressure)   . Pancreatic cyst   . Adenomatous colon polyp   . Osteoporosis     Past Surgical History  Procedure Laterality Date  . Ovarian cancer debulking  09/2008  . Hemorrhoid surgery  2001    Family History  Problem Relation Age of Onset  . Alzheimer's disease Mother 50  . Other Mother 33    TAH for excessive bleeding  . Lung cancer Father     smoker; metastasis to stomach and other areas  . Heart attack Maternal Uncle   . Other Paternal Aunt      stomach issues  . Heart Problems Paternal Uncle   . Other Maternal Grandmother     stomach issues; +hysterectomy  . Heart attack Maternal Grandfather   . Infertility Daughter   . Stomach cancer Cousin     dx. mid-60s  . Other Cousin     stomach issues  . Leukemia Cousin 18  . Stomach cancer Other   . Cancer Cousin     unknown type  . Other Cousin     stomach issues  . Heart Problems Maternal Uncle   . Diabetes Paternal Aunt   . Heart Problems Paternal Uncle   . Emphysema Paternal Uncle     work exposure  . Breast cancer Cousin     dx. late 60s-early 13s    Social History:  reports that she has never smoked. She has never used smokeless tobacco. She reports that she does not drink alcohol or use illicit drugs.    Review of Systems    Lipid history:     Lab Results  Component Value Date   CHOL 148 10/20/2015   HDL 33.30* 10/20/2015   LDLCALC 84 10/20/2015   TRIG 155.0* 10/20/2015   CHOLHDL 4 10/20/2015           Hypertension:  Most recent eye exam was   Most recent foot exam:  Review of Systems  Constitutional: Positive for weight gain.       Gradual weight can over the last few years  Gastrointestinal: Positive for constipation.       Has abdominal discomfort  Endocrine: Negative for fatigue and polydipsia.  Genitourinary: Negative for frequency.  Musculoskeletal: Positive for muscle aches and back pain.  Neurological: Positive for tingling.      LABS:  Appointment on 10/20/2015  Component Date Value Ref Range Status  . WBC 10/20/2015 6.1  4.0 - 10.5 K/uL Final  . RBC 10/20/2015 4.30  3.87 - 5.11 Mil/uL Final  . Hemoglobin 10/20/2015 12.3  12.0 - 15.0 g/dL Final  . HCT 16/06/9603 35.5* 36.0 - 46.0 % Final  . MCV 10/20/2015 82.6  78.0 - 100.0 fl Final  . MCHC 10/20/2015 34.6  30.0 - 36.0 g/dL Final  .  RDW 10/20/2015 14.0  11.5 - 15.5 % Final  . Platelets 10/20/2015 222.0  150.0 - 400.0 K/uL Final  . Neutrophils Relative % 10/20/2015 63.5  43.0 -  77.0 % Final  . Lymphocytes Relative 10/20/2015 28.2  12.0 - 46.0 % Final  . Monocytes Relative 10/20/2015 6.9  3.0 - 12.0 % Final  . Eosinophils Relative 10/20/2015 1.0  0.0 - 5.0 % Final  . Basophils Relative 10/20/2015 0.4  0.0 - 3.0 % Final  . Neutro Abs 10/20/2015 3.9  1.4 - 7.7 K/uL Final  . Lymphs Abs 10/20/2015 1.7  0.7 - 4.0 K/uL Final  . Monocytes Absolute 10/20/2015 0.4  0.1 - 1.0 K/uL Final  . Eosinophils Absolute 10/20/2015 0.1  0.0 - 0.7 K/uL Final  . Basophils Absolute 10/20/2015 0.0  0.0 - 0.1 K/uL Final  . Sodium 10/20/2015 142  135 - 145 mEq/L Final  . Potassium 10/20/2015 3.5  3.5 - 5.1 mEq/L Final  . Chloride 10/20/2015 106  96 - 112 mEq/L Final  . CO2 10/20/2015 26  19 - 32 mEq/L Final  . Glucose, Bld 10/20/2015 95  70 - 99 mg/dL Final  . BUN 40/98/1191 12  6 - 23 mg/dL Final  . Creatinine, Ser 10/20/2015 0.76  0.40 - 1.20 mg/dL Final  . Calcium 47/82/9562 9.1  8.4 - 10.5 mg/dL Final  . GFR 13/04/6577 80.00  >60.00 mL/min Final  . Hgb A1c MFr Bld 10/20/2015 6.0  4.6 - 6.5 % Final   Glycemic Control Guidelines for People with Diabetes:Non Diabetic:  <6%Goal of Therapy: <7%Additional Action Suggested:  >8%   . Total Bilirubin 10/20/2015 0.7  0.2 - 1.2 mg/dL Final  . Bilirubin, Direct 10/20/2015 0.1  0.0 - 0.3 mg/dL Final  . Alkaline Phosphatase 10/20/2015 62  39 - 117 U/L Final  . AST 10/20/2015 27  0 - 37 U/L Final  . ALT 10/20/2015 28  0 - 35 U/L Final  . Total Protein 10/20/2015 7.6  6.0 - 8.3 g/dL Final  . Albumin 46/96/2952 4.4  3.5 - 5.2 g/dL Final  . Cholesterol 84/13/2440 148  0 - 200 mg/dL Final   ATP III Classification       Desirable:  < 200 mg/dL               Borderline High:  200 - 239 mg/dL          High:  > = 102 mg/dL  . Triglycerides 10/20/2015 155.0* 0.0 - 149.0 mg/dL Final   Normal:  <725 mg/dLBorderline High:  150 - 199 mg/dL  . HDL 10/20/2015 33.30* >39.00 mg/dL Final  . VLDL 36/64/4034 31.0  0.0 - 40.0 mg/dL Final  . LDL Cholesterol  10/20/2015 84  0 - 99 mg/dL Final  . Total CHOL/HDL Ratio 10/20/2015 4   Final                  Men          Women1/2 Average Risk     3.4          3.3Average Risk          5.0          4.42X Average Risk          9.6          7.13X Average Risk          15.0          11.0                      .  NonHDL 10/20/2015 115.02   Final   NOTE:  Non-HDL goal should be 30 mg/dL higher than patient's LDL goal (i.e. LDL goal of < 70 mg/dL, would have non-HDL goal of < 100 mg/dL)  . TSH 10/20/2015 0.91  0.35 - 4.50 uIU/mL Final  . Color, Urine 10/20/2015 YELLOW  Yellow;Lt. Yellow Final  . APPearance 10/20/2015 CLEAR  Clear Final  . Specific Gravity, Urine 10/20/2015 1.020  1.000-1.030 Final  . pH 10/20/2015 6.0  5.0 - 8.0 Final  . Total Protein, Urine 10/20/2015 NEGATIVE  Negative Final  . Urine Glucose 10/20/2015 NEGATIVE  Negative Final  . Ketones, ur 10/20/2015 15* Negative Final  . Bilirubin Urine 10/20/2015 NEGATIVE  Negative Final  . Hgb urine dipstick 10/20/2015 TRACE-INTACT* Negative Final  . Urobilinogen, UA 10/20/2015 0.2  0.0 - 1.0 Final  . Leukocytes, UA 10/20/2015 TRACE* Negative Final  . Nitrite 10/20/2015 NEGATIVE  Negative Final  . WBC, UA 10/20/2015 0-2/hpf  0-2/hpf Final  . RBC / HPF 10/20/2015 0-2/hpf  0-2/hpf Final  . Squamous Epithelial / LPF 10/20/2015 Rare(0-4/hpf)  Rare(0-4/hpf) Final    Physical Examination:  BP 126/86 mmHg  Pulse 84  Temp(Src) 98 F (36.7 C)  Resp 16  Ht 5' 3.5" (1.613 m)  Wt 191 lb (86.637 kg)  BMI 33.30 kg/m2  SpO2 96%  GENERAL:         Patient has generalized obesity  No cushingoid features .   HEENT:         Eye exam shows normal external appearance. Fundus exam shows no obvious retinopathy.  Oral exam shows normal mucosa .  NECK:   There is no lymphadenopathy Thyroid is not enlarged and no nodules felt.  Carotids are normal to palpation and no bruit heard LUNGS:         Chest is symmetrical. Lungs are clear to auscultation.[name]   HEART:          Heart sounds:  S1 and S2 are normal. No murmur or click heard., no S3 or S4.   ABDOMEN:   There is no distention present. Liver and spleen are not palpable. No other mass or tenderness present.   NEUROLOGICAL:   Ankle jerks are 1+ bilaterally.    Diabetic Foot Exam - Simple   Simple Foot Form  Visual Inspection  No deformities, no ulcerations, no other skin breakdown bilaterally:  Yes  Sensation Testing  See comments:  Yes  Pulse Check  Posterior Tibialis and Dorsalis pulse intact bilaterally:  Yes  Comments  Slight decrease in monofilament sensation in the big toes             Vibration sense is  practically absent in distal first toes. MUSCULOSKELETAL:  There is no swelling or deformity of the peripheral joints. Spine is normal to inspection.   EXTREMITIES:     There is no edema. No skin lesions present.[name] SKIN:       No rash or lesions of concern.        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  Since her A1c is not in the diabetic range she may do well with lifestyle changes alone.  Discussed at least cutting out regular soft drinks.  She is open to seeing the dietitian for meal planning.  Discussed importance of regular exercise    Kahlee Metivier 10/26/2015, 8:06 AM   Note: This office note was prepared with Insurance underwriter. Any transcriptional errors that result from this process are unintentional.

## 2015-10-26 ENCOUNTER — Encounter: Payer: Self-pay | Admitting: Endocrinology

## 2015-10-26 ENCOUNTER — Other Ambulatory Visit: Payer: Medicare Other

## 2015-10-26 DIAGNOSIS — R7303 Prediabetes: Secondary | ICD-10-CM

## 2015-10-26 LAB — GLUCOSE TOLERANCE, 2 HOURS
GLUCOSE 1 HOUR GTT: 200 mg/dL
GLUCOSE, 2 HOUR: 147 mg/dL
GLUCOSE, FASTING: 122 mg/dL — AB (ref 70–99)

## 2015-10-27 ENCOUNTER — Other Ambulatory Visit: Payer: Self-pay | Admitting: Endocrinology

## 2015-10-27 DIAGNOSIS — R7303 Prediabetes: Secondary | ICD-10-CM

## 2015-10-27 DIAGNOSIS — E782 Mixed hyperlipidemia: Secondary | ICD-10-CM

## 2015-10-27 DIAGNOSIS — E669 Obesity, unspecified: Secondary | ICD-10-CM

## 2015-10-28 ENCOUNTER — Ambulatory Visit: Payer: Medicare Other | Admitting: Physician Assistant

## 2015-11-04 ENCOUNTER — Ambulatory Visit: Payer: Medicare Other | Admitting: Physician Assistant

## 2015-11-11 ENCOUNTER — Encounter: Payer: Self-pay | Admitting: Physician Assistant

## 2015-11-11 ENCOUNTER — Ambulatory Visit (INDEPENDENT_AMBULATORY_CARE_PROVIDER_SITE_OTHER): Payer: Medicare Other | Admitting: Physician Assistant

## 2015-11-11 ENCOUNTER — Telehealth: Payer: Self-pay | Admitting: Physician Assistant

## 2015-11-11 VITALS — BP 142/82 | HR 88 | Ht 63.0 in | Wt 190.0 lb

## 2015-11-11 DIAGNOSIS — R1031 Right lower quadrant pain: Secondary | ICD-10-CM | POA: Diagnosis not present

## 2015-11-11 DIAGNOSIS — Z86010 Personal history of colonic polyps: Secondary | ICD-10-CM | POA: Diagnosis not present

## 2015-11-11 DIAGNOSIS — R197 Diarrhea, unspecified: Secondary | ICD-10-CM | POA: Diagnosis not present

## 2015-11-11 DIAGNOSIS — K589 Irritable bowel syndrome without diarrhea: Secondary | ICD-10-CM

## 2015-11-11 MED ORDER — NA SULFATE-K SULFATE-MG SULF 17.5-3.13-1.6 GM/177ML PO SOLN: 1.0000 | Freq: Once | ORAL | Status: AC

## 2015-11-11 MED ORDER — RIFAXIMIN 550 MG PO TABS: 550.0000 mg | ORAL_TABLET | Freq: Three times a day (TID) | ORAL | Status: AC

## 2015-11-11 NOTE — Patient Instructions (Signed)
You will be contacted by [facility] regarding the Xifaxan 500 mg medication.  We sent a prescription  for Verbizi 100 mg to [facility]. We also sent a prescription for the colonoscopy prep-Suprep.  You have been scheduled for a colonoscopy. Please follow written instructions given to you at your visit today.  Please pick up your prep supplies at the pharmacy within the next 1-3 days. [facility] [address] If you use inhalers (even only as needed), please bring them with you on the day of your procedure. Your physician has requested that you go to www.startemmi.com and enter the access code given to you at your visit today. This web site gives a general overview about your procedure. However, you should still follow specific instructions given to you by our office regarding your preparation for the procedure.

## 2015-11-11 NOTE — Progress Notes (Signed)
Patient ID: [name], female   DOB: July 15, 1946, 70 y.o.   MRN: [id number]   Subjective:    Patient ID: [name], female    DOB: 20-Aug-1946, 70 y.o.   MRN: [id number]  HPI  [name] is a pleasant 70 year old white female previously known to Dr. Lina Shepherd who would like to establish with Dr. Marina Shepherd. She is referred by Dr. Posey Shepherd  today for evaluation of diarrhea and an episode of rectal bleeding. She has history of IBS, she also had a fallopian tube adenocarcinoma diagnosed in 2010 for which she underwent surgery and chemotherapy but no radiation. She has history of adenomatous colon polyps and last had colonoscopy in August 2012 with finding of 3 polyps all were 3-5 mm in size and removed. These were all tubular adenomas. Also noted internal hemorrhoids. Patient says she has been on Prolia  for osteoporosis which she takes once every 6 months. She seems to correlate worsening of her diarrhea with the Prolia  Injections. She says she rarely has a normal bowel movement and usually is having 4-7 loose bowel movements per day, at least over the past couple of years. She has some gas and bloating though this hasn't been terrible.. She says this morning she had 4-5 bowel movements prior to coming to this office visit. She also has intermittent fecal soilage particularly with coughing etc. She had one episode on February 9 where she passed bright red blood with a bowel movement. She noticed blood in the commode and mixed with the stool. This has not recurred He is on chronic AcipHex for GERD which controls her symptoms. Most recent labs to 14 2017 hemoglobin 12.3 hematocrit of 35 MCV of 82  Review of Systems Pertinent positive and negative review of systems were noted in the above HPI section.  All other review of systems was otherwise negative.  Outpatient Encounter Prescriptions as of 11/11/2015  Medication Sig  . aspirin 81 MG EC tablet Take 81 mg by mouth daily.    . bimatoprost  (LUMIGAN) 0.03 % ophthalmic solution Place 1 drop into both eyes at bedtime.    . Cholecalciferol (VITAMIN D3 SUPER STRENGTH) 2000 UNITS TABS Take 2 tablets (4,000 Units total) by mouth every morning.  . clindamycin (CLEOCIN T) 1 % lotion Apply 1 application topically daily. Reported on 09/18/2015  . cloNIDine (CATAPRES) 0.1 MG tablet TAKE 1 TABLET BY MOUTH 2 TIMES DAILY  . denosumab (PROLIA) 60 MG/ML SOLN injection Inject 60 mg into the skin every 6 (six) months. Administer in upper arm, thigh, or abdomen  . diphenoxylate-atropine (LOMOTIL) 2.5-0.025 MG per tablet Take 1 tablet by mouth 4 (four) times daily as needed for diarrhea or loose stools.  [name] NASCOBAL 500 MCG/0.1ML SOLN USE ONE SPRAY INTO THE NOSE ONCE A WEEK  . potassium chloride (KLOR-CON) 8 MEQ tablet Take 1 tablet (8 mEq total) by mouth daily.  . RABEprazole (ACIPHEX) 20 MG tablet TAKE 1 TABLET DAILY  . timolol (TIMOPTIC-XR) 0.5 % ophthalmic gel-forming Place 1 drop into both eyes 2 (two) times daily.   . Na Sulfate-K Sulfate-Mg Sulf SOLN Take 1 kit by mouth once.  . rifaximin (XIFAXAN) 550 MG TABS tablet Take 1 tablet (550 mg total) by mouth 3 (three) times daily.   No facility-administered encounter medications on file as of 11/11/2015.   Allergies  Allergen Reactions  . Actonel [Risedronate Sodium]     Upset stomach  . Boniva [Ibandronate Sodium]     cramp  . Calcium  Channel Blockers     Upset stomach  . Compazine     Daughter reacts to compazine/pt does not want to take  . Lyrica [Pregabalin]     Dizzy    Patient Active Problem List   Diagnosis Date Noted  . Hematochezia 10/20/2015  . Genetic testing 05/04/2015  . History of colonic polyps 05/04/2015  . Family history of stomach cancer 05/04/2015  . Dense breast tissue 03/28/2015  . Flank pain 12/22/2014  . Well adult exam 11/12/2014  . IBS (irritable bowel syndrome) 07/09/2014  . CN (constipation) 07/07/2014  . LLQ abdominal pain 07/07/2014  . Obstipation  06/20/2014  . Dizziness, nonspecific 02/24/2014  . Fallopian tube cancer, carcinoma (HCC) 02/03/2014  . Diarrhea 01/20/2014  . Left groin pain 10/07/2013  . Piriformis syndrome of left side 09/23/2013  . Leg length discrepancy 09/23/2013  . Osteoporosis, unspecified 02/05/2013  . Hyperglycemia 02/05/2013  . Dyslipidemia 02/05/2013  . Anxiety 06/03/2011  . Paresthesia of both legs 12/03/2010  . Paresthesia of hand 12/03/2010  . ACUTE BRONCHITIS 08/27/2009  . Neoplasm of digestive system 09/25/2008  . Abdominal pain 09/22/2008  . WARTS, VIRAL, UNSPECIFIED 11/12/2007  . Vitamin D deficiency 11/12/2007  . GERD 11/12/2007  . Essential hypertension 08/10/2007  . HIP PAIN 08/10/2007  . LOW BACK PAIN 08/10/2007  . B12 deficiency 05/31/2007  . IBS 05/31/2007   Social History   Social History  . Marital Status: Married    Spouse Name: Jillyn Hidden  . Number of Children: 2  . Years of Education: N/A   Occupational History  . Homemaker    Social History Main Topics  . Smoking status: Never Smoker   . Smokeless tobacco: Never Used  . Alcohol Use: No  . Drug Use: No  . Sexual Activity: Not on file   Other Topics Concern  . Not on file   Social History Narrative    [name]'s family history includes Alzheimer's disease (age of onset: 64) in her mother; Breast cancer in her cousin; Cancer in her cousin; Diabetes in her paternal aunt; Emphysema in her paternal uncle; Heart Problems in her maternal uncle, paternal uncle, and paternal uncle; Heart attack in her maternal grandfather and maternal uncle; Infertility in her daughter; Leukemia (age of onset: 17) in her cousin; Lung cancer in her father; Other in her cousin, cousin, maternal grandmother, and paternal aunt; Other (age of onset: 49) in her mother; Stomach cancer in her cousin and other.      Objective:    Filed Vitals:   11/11/15 1001  BP: 142/82  Pulse: 88    Physical Exam   well-developed older white female in no acute  distress, pleasant blood pressure 142/80 pulse 88 height 5 foot 3 weight 190. HEENT :nontraumatic, cephalic EOMI PERRLA sclera anicteric she is mildly coarse regular rate and rhythm with S1-S2 soft systolic murmur, Pulmonary: clear bilaterally, Abdomen: obese soft , mild tenderness in the right lower quadrant, no guarding or rebound no palpable mass or hepatosplenomegaly. Rectal: exam not done, Extremities: no clubbing cyanosis or edema skin warm and dry, Neuropsych: mood and affect appropriate     Assessment & Plan:   #1 70 yo female With history of IBS and chronic diarrhea somewhat worsened over the past couple of years #2 intermittent fecal soilage #3 history of adenomatous colon polyps due for follow-up August 2017 #4 isolated incidence of bright red blood per rectum on one occasion in February 2017 no recurrence #5 history of fallopian tube adenocarcinoma #6  chronic GERD #7 osteoporosis  Plan; Will schedule for colonoscopy with Dr. Marina Shepherd. May benefit from small bowel biopsies to rule out microscopic colitis. Procedure discussed in detail with the patient and she is agreeable to proceed In the interim will give her a course of Xifaxan 550 mg by mouth 3 times a day 14 days if we can get this covered for her We'll also give her a trial of Viberzi  by mouth twice a day.    Brandi Aki S Trelyn Vanderlinde PA-C 11/11/2015   Cc: [name], [name], MD

## 2015-11-11 NOTE — Progress Notes (Signed)
Agree with initial assessment and plans 

## 2015-11-13 ENCOUNTER — Encounter: Payer: Self-pay | Admitting: Internal Medicine

## 2015-11-16 ENCOUNTER — Encounter: Payer: Self-pay | Admitting: Internal Medicine

## 2015-11-16 ENCOUNTER — Ambulatory Visit (INDEPENDENT_AMBULATORY_CARE_PROVIDER_SITE_OTHER): Payer: Medicare Other | Admitting: Internal Medicine

## 2015-11-16 VITALS — BP 150/86 | HR 79 | Ht 63.0 in | Wt 191.0 lb

## 2015-11-16 DIAGNOSIS — I10 Essential (primary) hypertension: Secondary | ICD-10-CM

## 2015-11-16 DIAGNOSIS — R197 Diarrhea, unspecified: Secondary | ICD-10-CM | POA: Diagnosis not present

## 2015-11-16 DIAGNOSIS — K589 Irritable bowel syndrome without diarrhea: Secondary | ICD-10-CM | POA: Diagnosis not present

## 2015-11-16 DIAGNOSIS — R739 Hyperglycemia, unspecified: Secondary | ICD-10-CM

## 2015-11-16 DIAGNOSIS — K921 Melena: Secondary | ICD-10-CM | POA: Diagnosis not present

## 2015-11-16 DIAGNOSIS — M545 Low back pain: Secondary | ICD-10-CM

## 2015-11-16 DIAGNOSIS — G8929 Other chronic pain: Secondary | ICD-10-CM

## 2015-11-16 DIAGNOSIS — Z0000 Encounter for general adult medical examination without abnormal findings: Secondary | ICD-10-CM | POA: Diagnosis not present

## 2015-11-16 DIAGNOSIS — Z23 Encounter for immunization: Secondary | ICD-10-CM | POA: Diagnosis not present

## 2015-11-16 DIAGNOSIS — E559 Vitamin D deficiency, unspecified: Secondary | ICD-10-CM

## 2015-11-16 MED ORDER — POTASSIUM CHLORIDE ER 8 MEQ PO TBCR: 8.0000 meq | EXTENDED_RELEASE_TABLET | Freq: Every day | ORAL | Status: DC

## 2015-11-16 NOTE — Assessment & Plan Note (Addendum)
Here for medicare wellness/physical  Diet: heart healthy  Physical activity: sedentary  Depression/mood screen: negative  Hearing: intact to whispered voice  Visual acuity: grossly normal, performs annual eye exam  ADLs: capable  Fall risk: none  Home safety: good  Cognitive evaluation: intact to orientation, naming, recall and repetition  EOL planning: adv directives, full code/ I agree  I have personally reviewed and have noted  1. The patient's medical, surgical and social history  2. Their use of alcohol, tobacco or illicit drugs  3. Their current medications and supplements  4. The patient's functional ability including ADL's, fall risks, home safety risks and hearing or visual impairment.  5. Diet and physical activities  6. Evidence for depression or mood disorders 7. The roster of all physicians providing medical care to patient - is listed in the Snapshot section of the chart and reviewed today.    Today patient counseled on age appropriate routine health concerns for screening and prevention, each reviewed and up to date or declined. Immunizations reviewed and up to date or declined. Labs ordered and reviewed. Risk factors for depression reviewed and negative. Hearing function and visual acuity are intact. ADLs screened and addressed as needed. Functional ability and level of safety reviewed and appropriate. Education, counseling and referrals performed based on assessed risks today. Patient provided with a copy of personalized plan for preventive services.   Pt declined a flu shot

## 2015-11-16 NOTE — Assessment & Plan Note (Signed)
Dr Henrene Pastor IBS-D Welchol did not work Try [employer] 3/17

## 2015-11-16 NOTE — Assessment & Plan Note (Signed)
Catapress (per Dr Ubaldo Glassing) - increase to bid

## 2015-11-16 NOTE — Assessment & Plan Note (Signed)
Improved a lot

## 2015-11-16 NOTE — Assessment & Plan Note (Signed)
No relapse in 1 mo GI ref

## 2015-11-16 NOTE — Assessment & Plan Note (Signed)
Dr Dwyane Dee

## 2015-11-16 NOTE — Assessment & Plan Note (Addendum)
Welchol did not work 3/17 Dr Jola Baptist Trellis Paganini 3/17 trial of Xifaxan and viberzi

## 2015-11-16 NOTE — Progress Notes (Signed)
 Subjective:  Patient ID: [name], female    DOB: 07/22/1946  Age: 69 y.o. MRN: [id number]  CC: No chief complaint on file.   HPI [name] presents for a well exam. F/u diarrhea, elevated glucose, B12 def. IBS has been an issue  Outpatient Prescriptions Prior to Visit  Medication Sig Dispense Refill  . aspirin 81 MG EC tablet Take 81 mg by mouth daily.      . bimatoprost (LUMIGAN) 0.03 % ophthalmic solution Place 1 drop into both eyes at bedtime.      . Cholecalciferol (VITAMIN D3 SUPER STRENGTH) 2000 UNITS TABS Take 2 tablets (4,000 Units total) by mouth every morning. 100 each 3  . clindamycin (CLEOCIN T) 1 % lotion Apply 1 application topically daily. Reported on 09/18/2015    . cloNIDine (CATAPRES) 0.1 MG tablet TAKE 1 TABLET BY MOUTH 2 TIMES DAILY 60 tablet 11  . denosumab (PROLIA) 60 MG/ML SOLN injection Inject 60 mg into the skin every 6 (six) months. Administer in upper arm, thigh, or abdomen    . diphenoxylate-atropine (LOMOTIL) 2.5-0.025 MG per tablet Take 1 tablet by mouth 4 (four) times daily as needed for diarrhea or loose stools. 60 tablet 1  . Na Sulfate-K Sulfate-Mg Sulf SOLN Take 1 kit by mouth once. 354 mL 0  . NASCOBAL 500 MCG/0.1ML SOLN USE ONE SPRAY INTO THE NOSE ONCE A WEEK 12 mL 2  . RABEprazole (ACIPHEX) 20 MG tablet TAKE 1 TABLET DAILY 90 tablet 3  . rifaximin (XIFAXAN) 550 MG TABS tablet Take 1 tablet (550 mg total) by mouth 3 (three) times daily. 42 tablet 0  . timolol (TIMOPTIC-XR) 0.5 % ophthalmic gel-forming Place 1 drop into both eyes 2 (two) times daily.     . potassium chloride (KLOR-CON) 8 MEQ tablet Take 1 tablet (8 mEq total) by mouth daily. 90 tablet 3   No facility-administered medications prior to visit.    ROS Review of Systems  Constitutional: Negative for chills, activity change, appetite change, fatigue and unexpected weight change.  HENT: Negative for congestion, mouth sores, rhinorrhea and sinus pressure.   Eyes: Negative  for visual disturbance.  Respiratory: Negative for cough, chest tightness and wheezing.   Cardiovascular: Negative for leg swelling.  Gastrointestinal: Positive for diarrhea. Negative for nausea, vomiting, abdominal pain, anal bleeding and rectal pain.  Genitourinary: Positive for frequency. Negative for difficulty urinating and vaginal pain.  Musculoskeletal: Negative for back pain and gait problem.  Skin: Negative for pallor and rash.  Neurological: Negative for dizziness, tremors, weakness, numbness and headaches.  Psychiatric/Behavioral: Negative for suicidal ideas, confusion, sleep disturbance and decreased concentration. The patient is nervous/anxious.     Objective:  BP 150/86 mmHg  Pulse 79  Ht 5' 3" (1.6 m)  Wt 191 lb (86.637 kg)  BMI 33.84 kg/m2  SpO2 95%  BP Readings from Last 3 Encounters:  11/16/15 150/86  11/11/15 142/82  10/23/15 126/86    Wt Readings from Last 3 Encounters:  11/16/15 191 lb (86.637 kg)  11/11/15 190 lb (86.183 kg)  10/23/15 191 lb (86.637 kg)    Physical Exam  Constitutional: She appears well-developed. No distress.  HENT:  Head: Normocephalic.  Right Ear: External ear normal.  Left Ear: External ear normal.  Nose: Nose normal.  Mouth/Throat: Oropharynx is clear and moist.  Eyes: Conjunctivae are normal. Pupils are equal, round, and reactive to light. Right eye exhibits no discharge. Left eye exhibits no discharge.  Neck: Normal range of motion. Neck   supple. No JVD present. No tracheal deviation present. No thyromegaly present.  Cardiovascular: Normal rate, regular rhythm and normal heart sounds.   Pulmonary/Chest: No stridor. No respiratory distress. She has no wheezes.  Abdominal: Soft. Bowel sounds are normal. She exhibits no distension and no mass. There is no tenderness. There is no rebound and no guarding.  Musculoskeletal: She exhibits no edema or tenderness.  Lymphadenopathy:    She has no cervical adenopathy.  Neurological: She  displays normal reflexes. No cranial nerve deficit. She exhibits normal muscle tone. Coordination normal.  Skin: No rash noted. No erythema.  Psychiatric: She has a normal mood and affect. Her behavior is normal. Judgment and thought content normal.    Lab Results  Component Value Date   WBC 6.1 10/20/2015   HGB 12.3 10/20/2015   HCT 35.5* 10/20/2015   PLT 222.0 10/20/2015   GLUCOSE 95 10/20/2015   CHOL 148 10/20/2015   TRIG 155.0* 10/20/2015   HDL 33.30* 10/20/2015   LDLCALC 84 10/20/2015   ALT 28 10/20/2015   AST 27 10/20/2015   NA 142 10/20/2015   K 3.5 10/20/2015   CL 106 10/20/2015   CREATININE 0.76 10/20/2015   BUN 12 10/20/2015   CO2 26 10/20/2015   TSH 0.91 10/20/2015   HGBA1C 6.0 10/20/2015    Mm Screening Breast Tomo Bilateral  12/23/2014  CLINICAL DATA:  Screening. EXAM: DIGITAL SCREENING BILATERAL MAMMOGRAM WITH 3D TOMO WITH CAD COMPARISON:  Previous exam(s). ACR Breast Density Category c: The breast tissue is heterogeneously dense, which may obscure small masses. FINDINGS: There are no findings suspicious for malignancy. Images were processed with CAD. IMPRESSION: No mammographic evidence of malignancy. A result letter of this screening mammogram will be mailed directly to the patient. RECOMMENDATION: Screening mammogram in one year. (Code:SM-B-01Y) BI-RADS CATEGORY  1: Negative. Electronically Signed   By: David  Ormond M.D.   On: 12/23/2014 15:41    Assessment & Plan:   Diagnoses and all orders for this visit:  Well adult exam  Diarrhea, unspecified type  Hematochezia  Hyperglycemia  Vitamin D deficiency  Essential hypertension  IBS (irritable bowel syndrome)  Chronic midline low back pain without sciatica  Need for prophylactic vaccination against Streptococcus pneumoniae (pneumococcus) -     Pneumococcal polysaccharide vaccine 23-valent greater than or equal to 2yo subcutaneous/IM  Other orders -     potassium chloride (KLOR-CON) 8 MEQ tablet;  Take 1 tablet (8 mEq total) by mouth daily.   I am having Ms. Belles maintain her aspirin, bimatoprost, timolol, denosumab, RABEprazole, cloNIDine, clindamycin, diphenoxylate-atropine, Cholecalciferol, NASCOBAL, Na Sulfate-K Sulfate-Mg Sulf, rifaximin, and potassium chloride.  Meds ordered this encounter  Medications  . potassium chloride (KLOR-CON) 8 MEQ tablet    Sig: Take 1 tablet (8 mEq total) by mouth daily.    Dispense:  90 tablet    Refill:  3     Follow-up: Return in about 6 months (around 05/18/2016) for a follow-up visit.  Alex , MD  

## 2015-11-16 NOTE — Assessment & Plan Note (Signed)
On Vit D 

## 2015-11-16 NOTE — Progress Notes (Signed)
Pre visit review using our clinic review tool, if applicable. No additional management support is needed unless otherwise documented below in the visit note. 

## 2015-11-16 NOTE — Patient Instructions (Signed)
Preventive Care for Adults, Female A healthy lifestyle and preventive care can promote health and wellness. Preventive health guidelines for women include the following key practices.  A routine yearly physical is a good way to check with your health care provider about your health and preventive screening. It is a chance to share any concerns and updates on your health and to receive a thorough exam.  Visit your dentist for a routine exam and preventive care every 6 months. Brush your teeth twice a day and floss once a day. Good oral hygiene prevents tooth decay and gum disease.  The frequency of eye exams is based on your age, health, family medical history, use of contact lenses, and other factors. Follow your health care provider's recommendations for frequency of eye exams.  Eat a healthy diet. Foods like vegetables, fruits, whole grains, low-fat dairy products, and lean protein foods contain the nutrients you need without too many calories. Decrease your intake of foods high in solid fats, added sugars, and salt. Eat the right amount of calories for you.Get information about a proper diet from your health care provider, if necessary.  Regular physical exercise is one of the most important things you can do for your health. Most adults should get at least 150 minutes of moderate-intensity exercise (any activity that increases your heart rate and causes you to sweat) each week. In addition, most adults need muscle-strengthening exercises on 2 or more days a week.  Maintain a healthy weight. The body mass index (BMI) is a screening tool to identify possible weight problems. It provides an estimate of body fat based on height and weight. Your health care provider can find your BMI and can help you achieve or maintain a healthy weight.For adults 20 years and older:  A BMI below 18.5 is considered underweight.  A BMI of 18.5 to 24.9 is normal.  A BMI of 25 to 29.9 is considered overweight.  A  BMI of 30 and above is considered obese.  Maintain normal blood lipids and cholesterol levels by exercising and minimizing your intake of saturated fat. Eat a balanced diet with plenty of fruit and vegetables. Blood tests for lipids and cholesterol should begin at age 45 and be repeated every 5 years. If your lipid or cholesterol levels are high, you are over 50, or you are at high risk for heart disease, you may need your cholesterol levels checked more frequently.Ongoing high lipid and cholesterol levels should be treated with medicines if diet and exercise are not working.  If you smoke, find out from your health care provider how to quit. If you do not use tobacco, do not start.  Lung cancer screening is recommended for adults aged 45-80 years who are at high risk for developing lung cancer because of a history of smoking. A yearly low-dose CT scan of the lungs is recommended for people who have at least a 30-pack-year history of smoking and are a current smoker or have quit within the past 15 years. A pack year of smoking is smoking an average of 1 pack of cigarettes a day for 1 year (for example: 1 pack a day for 30 years or 2 packs a day for 15 years). Yearly screening should continue until the smoker has stopped smoking for at least 15 years. Yearly screening should be stopped for people who develop a health problem that would prevent them from having lung cancer treatment.  If you are pregnant, do not drink alcohol. If you are  breastfeeding, be very cautious about drinking alcohol. If you are not pregnant and choose to drink alcohol, do not have more than 1 drink per day. One drink is considered to be 12 ounces (355 mL) of beer, 5 ounces (148 mL) of wine, or 1.5 ounces (44 mL) of liquor.  Avoid use of street drugs. Do not share needles with anyone. Ask for help if you need support or instructions about stopping the use of drugs.  High blood pressure causes heart disease and increases the risk  of stroke. Your blood pressure should be checked at least every 1 to 2 years. Ongoing high blood pressure should be treated with medicines if weight loss and exercise do not work.  If you are 55-79 years old, ask your health care provider if you should take aspirin to prevent strokes.  Diabetes screening is done by taking a blood sample to check your blood glucose level after you have not eaten for a certain period of time (fasting). If you are not overweight and you do not have risk factors for diabetes, you should be screened once every 3 years starting at age 45. If you are overweight or obese and you are 40-70 years of age, you should be screened for diabetes every year as part of your cardiovascular risk assessment.  Breast cancer screening is essential preventive care for women. You should practice "breast self-awareness." This means understanding the normal appearance and feel of your breasts and may include breast self-examination. Any changes detected, no matter how small, should be reported to a health care provider. Women in their 20s and 30s should have a clinical breast exam (CBE) by a health care provider as part of a regular health exam every 1 to 3 years. After age 40, women should have a CBE every year. Starting at age 40, women should consider having a mammogram (breast X-ray test) every year. Women who have a family history of breast cancer should talk to their health care provider about genetic screening. Women at a high risk of breast cancer should talk to their health care providers about having an MRI and a mammogram every year.  Breast cancer gene (BRCA)-related cancer risk assessment is recommended for women who have family members with BRCA-related cancers. BRCA-related cancers include breast, ovarian, tubal, and peritoneal cancers. Having family members with these cancers may be associated with an increased risk for harmful changes (mutations) in the breast cancer genes BRCA1 and  BRCA2. Results of the assessment will determine the need for genetic counseling and BRCA1 and BRCA2 testing.  Your health care provider may recommend that you be screened regularly for cancer of the pelvic organs (ovaries, uterus, and vagina). This screening involves a pelvic examination, including checking for microscopic changes to the surface of your cervix (Pap test). You may be encouraged to have this screening done every 3 years, beginning at age 21.  For women ages 30-65, health care providers may recommend pelvic exams and Pap testing every 3 years, or they may recommend the Pap and pelvic exam, combined with testing for human papilloma virus (HPV), every 5 years. Some types of HPV increase your risk of cervical cancer. Testing for HPV may also be done on women of any age with unclear Pap test results.  Other health care providers may not recommend any screening for nonpregnant women who are considered low risk for pelvic cancer and who do not have symptoms. Ask your health care provider if a screening pelvic exam is right for   you.  If you have had past treatment for cervical cancer or a condition that could lead to cancer, you need Pap tests and screening for cancer for at least 20 years after your treatment. If Pap tests have been discontinued, your risk factors (such as having a new sexual partner) need to be reassessed to determine if screening should resume. Some women have medical problems that increase the chance of getting cervical cancer. In these cases, your health care provider may recommend more frequent screening and Pap tests.  Colorectal cancer can be detected and often prevented. Most routine colorectal cancer screening begins at the age of 50 years and continues through age 75 years. However, your health care provider may recommend screening at an earlier age if you have risk factors for colon cancer. On a yearly basis, your health care provider may provide home test kits to check  for hidden blood in the stool. Use of a small camera at the end of a tube, to directly examine the colon (sigmoidoscopy or colonoscopy), can detect the earliest forms of colorectal cancer. Talk to your health care provider about this at age 50, when routine screening begins. Direct exam of the colon should be repeated every 5-10 years through age 75 years, unless early forms of precancerous polyps or small growths are found.  People who are at an increased risk for hepatitis B should be screened for this virus. You are considered at high risk for hepatitis B if:  You were born in a country where hepatitis B occurs often. Talk with your health care provider about which countries are considered high risk.  Your parents were born in a high-risk country and you have not received a shot to protect against hepatitis B (hepatitis B vaccine).  You have HIV or AIDS.  You use needles to inject street drugs.  You live with, or have sex with, someone who has hepatitis B.  You get hemodialysis treatment.  You take certain medicines for conditions like cancer, organ transplantation, and autoimmune conditions.  Hepatitis C blood testing is recommended for all people born from 1945 through 1965 and any individual with known risks for hepatitis C.  Practice safe sex. Use condoms and avoid high-risk sexual practices to reduce the spread of sexually transmitted infections (STIs). STIs include gonorrhea, chlamydia, syphilis, trichomonas, herpes, HPV, and human immunodeficiency virus (HIV). Herpes, HIV, and HPV are viral illnesses that have no cure. They can result in disability, cancer, and death.  You should be screened for sexually transmitted illnesses (STIs) including gonorrhea and chlamydia if:  You are sexually active and are younger than 24 years.  You are older than 24 years and your health care provider tells you that you are at risk for this type of infection.  Your sexual activity has changed  since you were last screened and you are at an increased risk for chlamydia or gonorrhea. Ask your health care provider if you are at risk.  If you are at risk of being infected with HIV, it is recommended that you take a prescription medicine daily to prevent HIV infection. This is called preexposure prophylaxis (PrEP). You are considered at risk if:  You are sexually active and do not regularly use condoms or know the HIV status of your partner(s).  You take drugs by injection.  You are sexually active with a partner who has HIV.  Talk with your health care provider about whether you are at high risk of being infected with HIV. If   you choose to begin PrEP, you should first be tested for HIV. You should then be tested every 3 months for as long as you are taking PrEP.  Osteoporosis is a disease in which the bones lose minerals and strength with aging. This can result in serious bone fractures or breaks. The risk of osteoporosis can be identified using a bone density scan. Women ages 67 years and over and women at risk for fractures or osteoporosis should discuss screening with their health care providers. Ask your health care provider whether you should take a calcium supplement or vitamin D to reduce the rate of osteoporosis.  Menopause can be associated with physical symptoms and risks. Hormone replacement therapy is available to decrease symptoms and risks. You should talk to your health care provider about whether hormone replacement therapy is right for you.  Use sunscreen. Apply sunscreen liberally and repeatedly throughout the day. You should seek shade when your shadow is shorter than you. Protect yourself by wearing long sleeves, pants, a wide-brimmed hat, and sunglasses year round, whenever you are outdoors.  Once a month, do a whole body skin exam, using a mirror to look at the skin on your back. Tell your health care provider of new moles, moles that have irregular borders, moles that  are larger than a pencil eraser, or moles that have changed in shape or color.  Stay current with required vaccines (immunizations).  Influenza vaccine. All adults should be immunized every year.  Tetanus, diphtheria, and acellular pertussis (Td, Tdap) vaccine. Pregnant women should receive 1 dose of Tdap vaccine during each pregnancy. The dose should be obtained regardless of the length of time since the last dose. Immunization is preferred during the 27th-36th week of gestation. An adult who has not previously received Tdap or who does not know her vaccine status should receive 1 dose of Tdap. This initial dose should be followed by tetanus and diphtheria toxoids (Td) booster doses every 10 years. Adults with an unknown or incomplete history of completing a 3-dose immunization series with Td-containing vaccines should begin or complete a primary immunization series including a Tdap dose. Adults should receive a Td booster every 10 years.  Varicella vaccine. An adult without evidence of immunity to varicella should receive 2 doses or a second dose if she has previously received 1 dose. Pregnant females who do not have evidence of immunity should receive the first dose after pregnancy. This first dose should be obtained before leaving the health care facility. The second dose should be obtained 4-8 weeks after the first dose.  Human papillomavirus (HPV) vaccine. Females aged 13-26 years who have not received the vaccine previously should obtain the 3-dose series. The vaccine is not recommended for use in pregnant females. However, pregnancy testing is not needed before receiving a dose. If a female is found to be pregnant after receiving a dose, no treatment is needed. In that case, the remaining doses should be delayed until after the pregnancy. Immunization is recommended for any person with an immunocompromised condition through the age of 61 years if she did not get any or all doses earlier. During the  3-dose series, the second dose should be obtained 4-8 weeks after the first dose. The third dose should be obtained 24 weeks after the first dose and 16 weeks after the second dose.  Zoster vaccine. One dose is recommended for adults aged 30 years or older unless certain conditions are present.  Measles, mumps, and rubella (MMR) vaccine. Adults born  before 1957 generally are considered immune to measles and mumps. Adults born in 1957 or later should have 1 or more doses of MMR vaccine unless there is a contraindication to the vaccine or there is laboratory evidence of immunity to each of the three diseases. A routine second dose of MMR vaccine should be obtained at least 28 days after the first dose for students attending postsecondary schools, health care workers, or international travelers. People who received inactivated measles vaccine or an unknown type of measles vaccine during 1963-1967 should receive 2 doses of MMR vaccine. People who received inactivated mumps vaccine or an unknown type of mumps vaccine before 1979 and are at high risk for mumps infection should consider immunization with 2 doses of MMR vaccine. For females of childbearing age, rubella immunity should be determined. If there is no evidence of immunity, females who are not pregnant should be vaccinated. If there is no evidence of immunity, females who are pregnant should delay immunization until after pregnancy. Unvaccinated health care workers born before 1957 who lack laboratory evidence of measles, mumps, or rubella immunity or laboratory confirmation of disease should consider measles and mumps immunization with 2 doses of MMR vaccine or rubella immunization with 1 dose of MMR vaccine.  Pneumococcal 13-valent conjugate (PCV13) vaccine. When indicated, a person who is uncertain of his immunization history and has no record of immunization should receive the PCV13 vaccine. All adults 65 years of age and older should receive this  vaccine. An adult aged 19 years or older who has certain medical conditions and has not been previously immunized should receive 1 dose of PCV13 vaccine. This PCV13 should be followed with a dose of pneumococcal polysaccharide (PPSV23) vaccine. Adults who are at high risk for pneumococcal disease should obtain the PPSV23 vaccine at least 8 weeks after the dose of PCV13 vaccine. Adults older than 70 years of age who have normal immune system function should obtain the PPSV23 vaccine dose at least 1 year after the dose of PCV13 vaccine.  Pneumococcal polysaccharide (PPSV23) vaccine. When PCV13 is also indicated, PCV13 should be obtained first. All adults aged 65 years and older should be immunized. An adult younger than age 65 years who has certain medical conditions should be immunized. Any person who resides in a nursing home or long-term care facility should be immunized. An adult smoker should be immunized. People with an immunocompromised condition and certain other conditions should receive both PCV13 and PPSV23 vaccines. People with human immunodeficiency virus (HIV) infection should be immunized as soon as possible after diagnosis. Immunization during chemotherapy or radiation therapy should be avoided. Routine use of PPSV23 vaccine is not recommended for American Indians, [place] Natives, or people younger than 65 years unless there are medical conditions that require PPSV23 vaccine. When indicated, people who have unknown immunization and have no record of immunization should receive PPSV23 vaccine. One-time revaccination 5 years after the first dose of PPSV23 is recommended for people aged 19-64 years who have chronic kidney failure, nephrotic syndrome, asplenia, or immunocompromised conditions. People who received 1-2 doses of PPSV23 before age 65 years should receive another dose of PPSV23 vaccine at age 65 years or later if at least 5 years have passed since the previous dose. Doses of PPSV23 are not  needed for people immunized with PPSV23 at or after age 65 years.  Meningococcal vaccine. Adults with asplenia or persistent complement component deficiencies should receive 2 doses of quadrivalent meningococcal conjugate (MenACWY-D) vaccine. The doses should be obtained   at least 2 months apart. Microbiologists working with certain meningococcal bacteria, [place] recruits, people at risk during an outbreak, and people who travel to or live in countries with a high rate of meningitis should be immunized. A first-year college student up through age 34 years who is living in a residence hall should receive a dose if she did not receive a dose on or after her 16th birthday. Adults who have certain high-risk conditions should receive one or more doses of vaccine.  Hepatitis A vaccine. Adults who wish to be protected from this disease, have certain high-risk conditions, work with hepatitis A-infected animals, work in hepatitis A research labs, or travel to or work in countries with a high rate of hepatitis A should be immunized. Adults who were previously unvaccinated and who anticipate close contact with an international adoptee during the first 60 days after arrival in the [place] from a country with a high rate of hepatitis A should be immunized.  Hepatitis B vaccine. Adults who wish to be protected from this disease, have certain high-risk conditions, may be exposed to blood or other infectious body fluids, are household contacts or sex partners of hepatitis B positive people, are clients or workers in certain care facilities, or travel to or work in countries with a high rate of hepatitis B should be immunized.  Haemophilus influenzae type b (Hib) vaccine. A previously unvaccinated person with asplenia or sickle cell disease or having a scheduled splenectomy should receive 1 dose of Hib vaccine. Regardless of previous immunization, a recipient of a hematopoietic stem cell transplant should receive a  3-dose series 6-12 months after her successful transplant. Hib vaccine is not recommended for adults with HIV infection. Preventive Services / Frequency Ages 35 to 4 years  Blood pressure check.** / Every 3-5 years.  Lipid and cholesterol check.** / Every 5 years beginning at age 60.  Clinical breast exam.** / Every 3 years for women in their 71s and 10s.  BRCA-related cancer risk assessment.** / For women who have family members with a BRCA-related cancer (breast, ovarian, tubal, or peritoneal cancers).  Pap test.** / Every 2 years from ages 76 through 26. Every 3 years starting at age 61 through age 76 or 93 with a history of 3 consecutive normal Pap tests.  HPV screening.** / Every 3 years from ages 37 through ages 60 to 51 with a history of 3 consecutive normal Pap tests.  Hepatitis C blood test.** / For any individual with known risks for hepatitis C.  Skin self-exam. / Monthly.  Influenza vaccine. / Every year.  Tetanus, diphtheria, and acellular pertussis (Tdap, Td) vaccine.** / Consult your health care provider. Pregnant women should receive 1 dose of Tdap vaccine during each pregnancy. 1 dose of Td every 10 years.  Varicella vaccine.** / Consult your health care provider. Pregnant females who do not have evidence of immunity should receive the first dose after pregnancy.  HPV vaccine. / 3 doses over 6 months, if 93 and younger. The vaccine is not recommended for use in pregnant females. However, pregnancy testing is not needed before receiving a dose.  Measles, mumps, rubella (MMR) vaccine.** / You need at least 1 dose of MMR if you were born in 1957 or later. You may also need a 2nd dose. For females of childbearing age, rubella immunity should be determined. If there is no evidence of immunity, females who are not pregnant should be vaccinated. If there is no evidence of immunity, females who are  pregnant should delay immunization until after pregnancy.  Pneumococcal  13-valent conjugate (PCV13) vaccine.** / Consult your health care provider.  Pneumococcal polysaccharide (PPSV23) vaccine.** / 1 to 2 doses if you smoke cigarettes or if you have certain conditions.  Meningococcal vaccine.** / 1 dose if you are age 68 to 8 years and a Market researcher living in a residence hall, or have one of several medical conditions, you need to get vaccinated against meningococcal disease. You may also need additional booster doses.  Hepatitis A vaccine.** / Consult your health care provider.  Hepatitis B vaccine.** / Consult your health care provider.  Haemophilus influenzae type b (Hib) vaccine.** / Consult your health care provider. Ages 7 to 53 years  Blood pressure check.** / Every year.  Lipid and cholesterol check.** / Every 5 years beginning at age 25 years.  Lung cancer screening. / Every year if you are aged 11-80 years and have a 30-pack-year history of smoking and currently smoke or have quit within the past 15 years. Yearly screening is stopped once you have quit smoking for at least 15 years or develop a health problem that would prevent you from having lung cancer treatment.  Clinical breast exam.** / Every year after age 48 years.  BRCA-related cancer risk assessment.** / For women who have family members with a BRCA-related cancer (breast, ovarian, tubal, or peritoneal cancers).  Mammogram.** / Every year beginning at age 41 years and continuing for as long as you are in good health. Consult with your health care provider.  Pap test.** / Every 3 years starting at age 65 years through age 37 or 70 years with a history of 3 consecutive normal Pap tests.  HPV screening.** / Every 3 years from ages 72 years through ages 60 to 40 years with a history of 3 consecutive normal Pap tests.  Fecal occult blood test (FOBT) of stool. / Every year beginning at age 21 years and continuing until age 5 years. You may not need to do this test if you get  a colonoscopy every 10 years.  Flexible sigmoidoscopy or colonoscopy.** / Every 5 years for a flexible sigmoidoscopy or every 10 years for a colonoscopy beginning at age 35 years and continuing until age 48 years.  Hepatitis C blood test.** / For all people born from [birth year] through 1965 and any individual with known risks for hepatitis C.  Skin self-exam. / Monthly.  Influenza vaccine. / Every year.  Tetanus, diphtheria, and acellular pertussis (Tdap/Td) vaccine.** / Consult your health care provider. Pregnant women should receive 1 dose of Tdap vaccine during each pregnancy. 1 dose of Td every 10 years.  Varicella vaccine.** / Consult your health care provider. Pregnant females who do not have evidence of immunity should receive the first dose after pregnancy.  Zoster vaccine.** / 1 dose for adults aged 30 years or older.  Measles, mumps, rubella (MMR) vaccine.** / You need at least 1 dose of MMR if you were born in 1957 or later. You may also need a second dose. For females of childbearing age, rubella immunity should be determined. If there is no evidence of immunity, females who are not pregnant should be vaccinated. If there is no evidence of immunity, females who are pregnant should delay immunization until after pregnancy.  Pneumococcal 13-valent conjugate (PCV13) vaccine.** / Consult your health care provider.  Pneumococcal polysaccharide (PPSV23) vaccine.** / 1 to 2 doses if you smoke cigarettes or if you have certain conditions.  Meningococcal vaccine.** /  Consult your health care provider.  Hepatitis A vaccine.** / Consult your health care provider.  Hepatitis B vaccine.** / Consult your health care provider.  Haemophilus influenzae type b (Hib) vaccine.** / Consult your health care provider. Ages 64 years and over  Blood pressure check.** / Every year.  Lipid and cholesterol check.** / Every 5 years beginning at age 23 years.  Lung cancer screening. / Every year if you  are aged 16-80 years and have a 30-pack-year history of smoking and currently smoke or have quit within the past 15 years. Yearly screening is stopped once you have quit smoking for at least 15 years or develop a health problem that would prevent you from having lung cancer treatment.  Clinical breast exam.** / Every year after age 74 years.  BRCA-related cancer risk assessment.** / For women who have family members with a BRCA-related cancer (breast, ovarian, tubal, or peritoneal cancers).  Mammogram.** / Every year beginning at age 44 years and continuing for as long as you are in good health. Consult with your health care provider.  Pap test.** / Every 3 years starting at age 58 years through age 22 or 39 years with 3 consecutive normal Pap tests. Testing can be stopped between 65 and 70 years with 3 consecutive normal Pap tests and no abnormal Pap or HPV tests in the past 10 years.  HPV screening.** / Every 3 years from ages 64 years through ages 70 or 61 years with a history of 3 consecutive normal Pap tests. Testing can be stopped between 65 and 70 years with 3 consecutive normal Pap tests and no abnormal Pap or HPV tests in the past 10 years.  Fecal occult blood test (FOBT) of stool. / Every year beginning at age 40 years and continuing until age 27 years. You may not need to do this test if you get a colonoscopy every 10 years.  Flexible sigmoidoscopy or colonoscopy.** / Every 5 years for a flexible sigmoidoscopy or every 10 years for a colonoscopy beginning at age 7 years and continuing until age 32 years.  Hepatitis C blood test.** / For all people born from [birth year] through 1965 and any individual with known risks for hepatitis C.  Osteoporosis screening.** / A one-time screening for women ages 30 years and over and women at risk for fractures or osteoporosis.  Skin self-exam. / Monthly.  Influenza vaccine. / Every year.  Tetanus, diphtheria, and acellular pertussis (Tdap/Td)  vaccine.** / 1 dose of Td every 10 years.  Varicella vaccine.** / Consult your health care provider.  Zoster vaccine.** / 1 dose for adults aged 35 years or older.  Pneumococcal 13-valent conjugate (PCV13) vaccine.** / Consult your health care provider.  Pneumococcal polysaccharide (PPSV23) vaccine.** / 1 dose for all adults aged 46 years and older.  Meningococcal vaccine.** / Consult your health care provider.  Hepatitis A vaccine.** / Consult your health care provider.  Hepatitis B vaccine.** / Consult your health care provider.  Haemophilus influenzae type b (Hib) vaccine.** / Consult your health care provider. ** Family history and personal history of risk and conditions may change your health care provider's recommendations.   This information is not intended to replace advice given to you by your health care provider. Make sure you discuss any questions you have with your health care provider.   Document Released: 10/18/2001 Document Revised: 09/12/2014 Document Reviewed: 01/17/2011 Elsevier Interactive Patient Education [employer].

## 2015-11-20 ENCOUNTER — Telehealth: Payer: Self-pay | Admitting: *Deleted

## 2015-11-20 NOTE — Telephone Encounter (Signed)
Called and tried to leave message for the patient. No answering device.  Only one phone number in [place], the home number of [phone]. We got a fax from [facility] that her Doreene Nest was approved.  Dont know if the patient was able to get the medication.

## 2015-11-20 NOTE — Telephone Encounter (Signed)
Called [facility], spoke to [place]. She advised me that the patient was notified that her medication was approved and the medication would be shipped out to her early this past week, 11-16-2015.

## 2015-11-23 ENCOUNTER — Other Ambulatory Visit: Payer: Self-pay

## 2015-11-23 DIAGNOSIS — Z1231 Encounter for screening mammogram for malignant neoplasm of breast: Secondary | ICD-10-CM

## 2015-11-26 ENCOUNTER — Encounter: Payer: Medicare Other | Attending: Internal Medicine | Admitting: Dietician

## 2015-11-26 ENCOUNTER — Encounter: Payer: Self-pay | Admitting: Dietician

## 2015-11-26 VITALS — Ht 64.0 in | Wt 192.0 lb

## 2015-11-26 DIAGNOSIS — R7303 Prediabetes: Secondary | ICD-10-CM | POA: Diagnosis not present

## 2015-11-26 DIAGNOSIS — E782 Mixed hyperlipidemia: Secondary | ICD-10-CM | POA: Insufficient documentation

## 2015-11-26 DIAGNOSIS — E669 Obesity, unspecified: Secondary | ICD-10-CM | POA: Diagnosis not present

## 2015-11-26 NOTE — Progress Notes (Signed)
  Medical Nutrition Therapy:  Appt start time: [id number] end time:  [id number].   Assessment:  Primary concerns today: Patient is here with her husband.  She stated that her opthamologist saw diabetic damage in her eyes and she was found to have prediabetes.  She would like to learn more about controlling her blood sugar, IBS symptoms, and weight.  Patient states that she lacks discipline for weight loss.  Hx includes GERD, HTN, Prediabetes, Osteoporosis, Ovarian Cancer 8 years ago, Neuropathy, Vitamin B-12 deficiency.  She is going for another colonoscopy soon due to chronic diarrhea.  HgbA1C 6% (10/20/15) and reported  Patient lives with her husband.  Husband shops and patient cooks. Her daughter Anderson Malta works for [employer] and lives nearby.  Preferred Learning Style:   No preference indicated   Learning Readiness:   Ready  Change in progress  MEDICATIONS: see list   DIETARY INTAKE:  24-hr recall:  B ( AM): rice krispies, strawberries, 2% milk and Aunt Jemimah Waffle, butter and "drop" of syrup although usually toast  Snk ( AM): none  L ( PM): crackers and ham OR sandwich with chips Snk ( PM): a few cracker or pretzels or apple D ( PM): [place] last night-saled with chicken, roll, cherry pie OR chicken, fish, or pork shop (broiled) or hamburger, vegetables, potatoes or rice Snk ( PM): small amount of crackers Beverages: water with lemon, occasional regular coke, unsweetened tea, cranberry juice  Usual physical activity: none.  Yard work at times and states that she likes walking.    Estimated energy needs: 1400 calories 158 g carbohydrates 88 g protein 47 g fat  Progress Towards Goal(s):  In progress.   Nutritional Diagnosis:  NB-1.1 Food and nutrition-related knowledge deficit As related to balance of carbohydrate, protein, and fat.  As evidenced by patient report.    Intervention:  Nutrition counseling and education for prediabetes initiated. Discussed Carb Counting by food  group as method of portion control, reading food labels, and benefits of increased activity. Also discussed basic physiology of Diabetes, target BG ranges pre and post meals.   Discussed diet in relationship to her other medical issues.  Discussed meal planning and mindful eating.  Exercise most days of the week.  Start slow (5-10 minutes twice per week) and increase as tolerated to 30 minutes most days. Listen to your body.  Stop eating when you are full.  Eat slowly. Avoid mindless eating (bordom, habit). Make half your plate non starchy vegetables. More variety with lunch. Be very careful with your beverages.  Avoid drinking carbohydrates Bake or broil rather than fry.  Plan:  Aim for 2-3 Carb Choices per meal (30-45 grams) +/- 1 either way  Aim for 0-15 Carbs per snack if hungry  Include protein in moderation with your meals and snacks Consider reading food labels for Total Carbohydrate and Fat Grams of foods  Teaching Method Utilized:  Visual Auditory Hands on  Handouts given during visit include: Living Well with Diabetes Food Label handouts Meal Plan Card My plate Snack list  Barriers to learning/adherence to lifestyle change: none  Demonstrated degree of understanding via:  Teach Back   Monitoring/Evaluation:  Dietary intake, exercise, label readin, and body weight prn.

## 2015-11-26 NOTE — Patient Instructions (Signed)
Exercise most days of the week.  Start slow (5-10 minutes twice per week) and increase as tolerated to 30 minutes most days. Listen to your body.  Stop eating when you are full.  Eat slowly. Avoid mindless eating (bordom, habit). Make half your plate non starchy vegetables. More variety with lunch. Be very careful with your beverages.  Avoid drinking carbohydrates Bake or broil rather than fry.  Plan:  Aim for 2-3 Carb Choices per meal (30-45 grams) +/- 1 either way  Aim for 0-15 Carbs per snack if hungry  Include protein in moderation with your meals and snacks Consider reading food labels for Total Carbohydrate and Fat Grams of foods

## 2015-12-04 NOTE — Telephone Encounter (Signed)
See Previous Encounter

## 2015-12-14 ENCOUNTER — Telehealth: Payer: Self-pay | Admitting: Internal Medicine

## 2015-12-14 NOTE — Telephone Encounter (Signed)
I have electronically submitted pt's info for Prolia insurance verification and will notify you once I have a response. Thank you. °

## 2015-12-23 ENCOUNTER — Ambulatory Visit (AMBULATORY_SURGERY_CENTER): Payer: Medicare Other | Admitting: Internal Medicine

## 2015-12-23 ENCOUNTER — Encounter: Payer: Self-pay | Admitting: Internal Medicine

## 2015-12-23 VITALS — BP 118/58 | HR 55 | Temp 99.3°F | Resp 12 | Ht 63.0 in | Wt 191.0 lb

## 2015-12-23 DIAGNOSIS — D123 Benign neoplasm of transverse colon: Secondary | ICD-10-CM | POA: Diagnosis not present

## 2015-12-23 DIAGNOSIS — D120 Benign neoplasm of cecum: Secondary | ICD-10-CM | POA: Diagnosis not present

## 2015-12-23 DIAGNOSIS — R197 Diarrhea, unspecified: Secondary | ICD-10-CM

## 2015-12-23 DIAGNOSIS — D122 Benign neoplasm of ascending colon: Secondary | ICD-10-CM | POA: Diagnosis not present

## 2015-12-23 DIAGNOSIS — K529 Noninfective gastroenteritis and colitis, unspecified: Secondary | ICD-10-CM | POA: Diagnosis not present

## 2015-12-23 DIAGNOSIS — I10 Essential (primary) hypertension: Secondary | ICD-10-CM | POA: Diagnosis not present

## 2015-12-23 DIAGNOSIS — Z86010 Personal history of colonic polyps: Secondary | ICD-10-CM | POA: Diagnosis not present

## 2015-12-23 MED ORDER — SODIUM CHLORIDE 0.9 % IV SOLN: 500.0000 mL | INTRAVENOUS | Status: DC

## 2015-12-23 NOTE — Patient Instructions (Signed)
YOU HAD AN ENDOSCOPIC PROCEDURE TODAY AT THE [facility]:   Refer to the procedure report that was given to you for any specific questions about what was found during the examination.  If the procedure report does not answer your questions, please call your gastroenterologist to clarify.  If you requested that your care partner not be given the details of your procedure findings, then the procedure report has been included in a sealed envelope for you to review at your convenience later.  YOU SHOULD EXPECT: Some feelings of bloating in the abdomen. Passage of more gas than usual.  Walking can help get rid of the air that was put into your GI tract during the procedure and reduce the bloating. If you had a lower endoscopy (such as a colonoscopy or flexible sigmoidoscopy) you may notice spotting of blood in your stool or on the toilet paper. If you underwent a bowel prep for your procedure, you may not have a normal bowel movement for a few days.  Please Note:  You might notice some irritation and congestion in your nose or some drainage.  This is from the oxygen used during your procedure.  There is no need for concern and it should clear up in a day or so.  SYMPTOMS TO REPORT IMMEDIATELY:   Following lower endoscopy (colonoscopy or flexible sigmoidoscopy):  Excessive amounts of blood in the stool  Significant tenderness or worsening of abdominal pains  Swelling of the abdomen that is new, acute  Fever of 100F or higher   For urgent or emergent issues, a gastroenterologist can be reached at any hour by calling [phone].   DIET: Your first meal following the procedure should be a small meal and then it is ok to progress to your normal diet. Heavy or fried foods are harder to digest and may make you feel nauseous or bloated.  Likewise, meals heavy in dairy and vegetables can increase bloating.  Drink plenty of fluids but you should avoid alcoholic beverages for 24  hours.  ACTIVITY:  You should plan to take it easy for the rest of today and you should NOT DRIVE or use heavy machinery until tomorrow (because of the sedation medicines used during the test).    FOLLOW UP: Our staff will call the number listed on your records the next business day following your procedure to check on you and address any questions or concerns that you may have regarding the information given to you following your procedure. If we do not reach you, we will leave a message.  However, if you are feeling well and you are not experiencing any problems, there is no need to return our call.  We will assume that you have returned to your regular daily activities without incident.  If any biopsies were taken you will be contacted by phone or by letter within the next 1-3 weeks.  Please call us at [phone] if you have not heard about the biopsies in 3 weeks.    SIGNATURES/CONFIDENTIALITY: You and/or your care partner have signed paperwork which will be entered into your electronic medical record.  These signatures attest to the fact that that the information above on your After Visit Summary has been reviewed and is understood.  Full responsibility of the confidentiality of this discharge information lies with you and/or your care-partner.  Polyp information given.  

## 2015-12-23 NOTE — Progress Notes (Signed)
Patient awakening,vss,report to rn 

## 2015-12-23 NOTE — Progress Notes (Signed)
Called to room to assist during endoscopic procedure.  Patient ID and intended procedure confirmed with present staff. Received instructions for my participation in the procedure from the performing physician.  

## 2015-12-23 NOTE — Op Note (Signed)
[facility] Patient Name: [name] Procedure Date: 12/23/2015 2:27 PM MRN: [id number] Endoscopist: Docia Chuck. Henrene Pastor , MD Age: 70 Date of Birth: September 08, 1945 Gender: Female Procedure:                Colonoscopy, with snare polypectomy, random colon                            biopsies, and submucosal injec Indications:              High risk colon cancer surveillance: Personal                            history of advanced adenoma(Dr. Velora Heckler and Dr.                            Olevia Perches). Index examination 2001 with tubular                            adenoma and tubulovillous adenoma; 2005 negative;                            2012 with 3 subcentimeter adenomas, Incidental -                            Clinically significant diarrhea of unexplained                            origin. Recently given Xifaxan which helped Medicines:                Monitored Anesthesia Care Procedure:                Pre-Anesthesia Assessment:                           - Prior to the procedure, a History and Physical                            was performed, and patient medications and                            allergies were reviewed. The patient's tolerance of                            previous anesthesia was also reviewed. The risks                            and benefits of the procedure and the sedation                            options and risks were discussed with the patient.                            All questions were answered, and informed consent  was obtained. Prior Anticoagulants: The patient has                            taken no previous anticoagulant or antiplatelet                            agents. ASA Grade Assessment: II - A patient with                            mild systemic disease. After reviewing the risks                            and benefits, the patient was deemed in                            satisfactory condition to undergo the  procedure.                           After obtaining informed consent, the colonoscope                            was passed under direct vision. Throughout the                            procedure, the patient's blood pressure, pulse, and                            oxygen saturations were monitored continuously. The                            Model CF-HQ190L [id number]) scope was introduced                            through the anus and advanced to the the cecum,                            identified by appendiceal orifice and ileocecal                            valve. The ileocecal valve, appendiceal orifice,                            and rectum were photographed. The quality of the                            bowel preparation was excellent. The colonoscopy                            was performed without difficulty. The patient                            tolerated the procedure well. The bowel preparation  used was SUPREP. Scope In: [id number] PM Scope Out: 3:08:57 PM Scope Withdrawal Time: 0 hours 20 minutes 39 seconds  Total Procedure Duration: 0 hours 22 minutes 41 seconds  Findings:                 A 15 mm polyp was found in the transverse colon.                            The polyp was sessile. This was raised with                            submucosal normal saline injection. As was                            completely removed with cold snare piecemeal                            fashion. A submucosal [place] ink tattoo was placed                            just distal for future identification purposes.                           Two polyps were found in the ascending colon and                            cecum. The polyps were 3 to 5 mm in size. These                            polyps were removed with a cold snare. Resection                            and retrieval were complete.                           A single medium-sized angiodysplastic lesion was                             found in the ascending colon.                           The exam was otherwise without abnormality on                            direct and retroflexion views. Random colon                            biopsies were taken to rule out microscopic colitis. Complications:            No immediate complications. Estimated blood loss:                            None. Estimated Blood Loss:     Estimated blood loss: none. Impression:               -  One 15 mm polyp in the transverse colon, removed                            after submucosal saline lift piecemeal fashion.                            Tattoo placed distal.                           - Two 3 to 5 mm polyps in the ascending colon and                            in the cecum, removed with a cold snare. Resected                            and retrieved.                           - A single colonic angiodysplastic lesion.                           - The examination was otherwise normal on direct                            and retroflexion views. Recommendation:           - Repeat colonoscopy in 1 year for surveillance                            after piecemeal polypectomy.                           - Patient has a contact number available for                            emergencies. The signs and symptoms of potential                            delayed complications were discussed with the                            patient. Return to normal activities tomorrow.                            Written discharge instructions were provided to the                            patient.                           - Resume previous diet.                           - Continue present medications.                           -  Await pathology results. Docia Chuck. Henrene Pastor, MD 12/23/2015 3:29:12 PM This report has been signed electronically. CC Letter to:             Evie Lacks. [name], MD

## 2015-12-24 ENCOUNTER — Telehealth: Payer: Self-pay

## 2015-12-24 NOTE — Telephone Encounter (Signed)
  Follow up Call-  Call back number 12/23/2015  Post procedure Call Back phone  # [phone]  Permission to leave phone message Yes     Patient questions:  Do you have a fever, pain , or abdominal swelling? No. Pain Score  0 *  Have you tolerated food without any problems? Yes.    Have you been able to return to your normal activities? Yes.    Do you have any questions about your discharge instructions: Diet   No. Medications  No. Follow up visit  No.  Do you have questions or concerns about your Care? No.  Actions: * If pain score is 4 or above: No action needed, pain <4.

## 2015-12-25 NOTE — Telephone Encounter (Signed)
Estimated copay $25---patient has scheduled nurse visit for 5/3 for prolia injection

## 2015-12-28 ENCOUNTER — Ambulatory Visit
Admission: RE | Admit: 2015-12-28 | Discharge: 2015-12-28 | Disposition: A | Discharge status: Home / Self Care | Payer: Medicare Other | Source: Ambulatory Visit

## 2015-12-28 DIAGNOSIS — Z1231 Encounter for screening mammogram for malignant neoplasm of breast: Secondary | ICD-10-CM

## 2015-12-30 ENCOUNTER — Encounter: Payer: Self-pay | Admitting: Internal Medicine

## 2016-01-04 ENCOUNTER — Telehealth: Payer: Self-pay | Admitting: Internal Medicine

## 2016-01-04 MED ORDER — BUDESONIDE 3 MG PO CPEP: 9.0000 mg | ORAL_CAPSULE | Freq: Every day | ORAL | Status: DC

## 2016-01-04 NOTE — Telephone Encounter (Signed)
Spoke with pt and she is aware. Script sent to pharmacy. 

## 2016-01-04 NOTE — Telephone Encounter (Signed)
Pt states she received her path letter in the mail today, she has OV scheduled in June. Pt states she continues to have diarrhea and pain in her RLQ. Pt wants to know what else she can do for the discomfort and diarrhea. Please advise.

## 2016-01-04 NOTE — Telephone Encounter (Signed)
Entocort 9 mg daily until office visit

## 2016-01-05 ENCOUNTER — Ambulatory Visit: Payer: Medicare Other

## 2016-01-05 ENCOUNTER — Telehealth: Payer: Self-pay | Admitting: Internal Medicine

## 2016-01-05 NOTE — Telephone Encounter (Signed)
Patient's pharmacist advised patient of a possible interaction between Prolia and Entocort.  I reassured her that Dr. Henrene Pastor was familiar with her medication list and would not prescribe anything she shouldn't have but I would be happy to send him a message to verify this for her peace of mind.  Patient declined and said she was not that worried.

## 2016-01-06 ENCOUNTER — Ambulatory Visit (INDEPENDENT_AMBULATORY_CARE_PROVIDER_SITE_OTHER): Payer: Medicare Other

## 2016-01-06 DIAGNOSIS — M810 Age-related osteoporosis without current pathological fracture: Secondary | ICD-10-CM | POA: Diagnosis not present

## 2016-01-06 MED ORDER — DENOSUMAB 60 MG/ML ~~LOC~~ SOLN
60.0000 mg | Freq: Once | SUBCUTANEOUS | Status: AC
  Administered 2016-01-06: 60 mg via SUBCUTANEOUS

## 2016-01-16 ENCOUNTER — Other Ambulatory Visit: Payer: Self-pay | Admitting: Internal Medicine

## 2016-01-25 ENCOUNTER — Other Ambulatory Visit: Payer: Self-pay | Admitting: Endocrinology

## 2016-01-25 ENCOUNTER — Other Ambulatory Visit (INDEPENDENT_AMBULATORY_CARE_PROVIDER_SITE_OTHER): Payer: Medicare Other

## 2016-01-25 DIAGNOSIS — R7303 Prediabetes: Secondary | ICD-10-CM | POA: Diagnosis not present

## 2016-01-25 LAB — GLUCOSE, RANDOM: Glucose, Bld: 90 mg/dL (ref 70–99)

## 2016-01-25 LAB — HEMOGLOBIN A1C: HEMOGLOBIN A1C: 6.3 % (ref 4.6–6.5)

## 2016-01-28 ENCOUNTER — Ambulatory Visit (INDEPENDENT_AMBULATORY_CARE_PROVIDER_SITE_OTHER): Payer: Medicare Other | Admitting: Endocrinology

## 2016-01-28 ENCOUNTER — Encounter: Payer: Self-pay | Admitting: Endocrinology

## 2016-01-28 VITALS — BP 134/70 | HR 64 | Temp 98.0°F | Resp 16 | Ht 63.0 in | Wt 185.8 lb

## 2016-01-28 DIAGNOSIS — R7303 Prediabetes: Secondary | ICD-10-CM

## 2016-01-28 NOTE — Progress Notes (Signed)
Patient ID: [name], female   DOB: 02-16-1946, 70 y.o.   MRN: [id number]           Reason for Appointment: Follow up  Referring physician: Plotnickov  History of Present Illness:          She has prediabetes but also was told to have background retinopathy  On her periodic general checkups she has had an A1c done regularly and these are upper normal but no higher than 6%, the highest level being in 10/2015 Her glucose levels have been consistently below 100 except once when in nonfasting glucose was 102       She was evaluated with a glucose tolerance test which showed the following: Fasting glucose 122, one-hour glucose 202 hour glucose 147  He was referred to the dietitian for meal planning which she has done in 3/17 She has lost weight since her last visit             Exercise: walking some currently   Although her A1c is slightly higher at 6.3 her fasting glucose is now excellent at 89     Weight history:  Wt Readings from Last 3 Encounters:  01/28/16 185 lb 12.8 oz (84.278 kg)  12/23/15 191 lb (86.637 kg)  11/26/15 192 lb (87.091 kg)    Glycemic levels:   Lab Results  Component Value Date   HGBA1C 6.3 01/25/2016   HGBA1C 6.0 10/20/2015   HGBA1C 5.9 05/15/2015   Lab Results  Component Value Date   LDLCALC 84 10/20/2015   CREATININE 0.76 10/20/2015         Medication List       This list is accurate as of: 01/28/16  9:31 PM.  Always use your most recent med list.               aspirin 81 MG EC tablet  Take 81 mg by mouth daily.     bimatoprost 0.03 % ophthalmic solution  Commonly known as:  LUMIGAN  Place 1 drop into both eyes at bedtime.     budesonide 3 MG 24 hr capsule  Commonly known as:  ENTOCORT EC  Take 3 capsules (9 mg total) by mouth daily.     Cholecalciferol 2000 units Tabs  Commonly known as:  VITAMIN D3 SUPER STRENGTH  Take 2 tablets (4,000 Units total) by mouth every morning.     clindamycin 1 % lotion  Commonly known  as:  CLEOCIN T  Apply 1 application topically daily. Reported on 11/26/2015     cloNIDine 0.1 MG tablet  Commonly known as:  CATAPRES  TAKE 1 TABLET BY MOUTH 2 TIMES DAILY     denosumab 60 MG/ML Soln injection  Commonly known as:  PROLIA  Inject 60 mg into the skin every 6 (six) months. Administer in upper arm, thigh, or abdomen     diphenoxylate-atropine 2.5-0.025 MG tablet  Commonly known as:  LOMOTIL  Take 1 tablet by mouth 4 (four) times daily as needed for diarrhea or loose stools.     NASCOBAL 500 MCG/0.1ML Soln  Generic drug:  Cyanocobalamin  USE ONE SPRAY INTO THE NOSE ONCE A WEEK     potassium chloride 8 MEQ tablet  Commonly known as:  KLOR-CON  Take 1 tablet (8 mEq total) by mouth daily.     RABEprazole 20 MG tablet  Commonly known as:  ACIPHEX  Take 1 tablet by mouth  daily     rifaximin 200 MG tablet  Commonly  known as:  XIFAXAN  Take 550 mg by mouth 3 (three) times daily.     timolol 0.5 % ophthalmic gel-forming  Commonly known as:  TIMOPTIC-XR  Place 1 drop into both eyes 2 (two) times daily.        Allergies:  Allergies  Allergen Reactions  . Actonel [Risedronate Sodium]     Upset stomach  . Boniva [Ibandronate Sodium]     cramp  . Calcium Channel Blockers     Upset stomach  . Compazine     Daughter reacts to compazine/pt does not want to take  . Lyrica [Pregabalin]     Dizzy     Past Medical History  Diagnosis Date  . HTN (hypertension)   . LBP (low back pain)   . Menopause   . Vitamin B12 deficiency   . IBS (irritable bowel syndrome)   . Vitamin D deficiency   . GERD (gastroesophageal reflux disease)   . Ovarian cancer (HCC) 09/2008    Dr Janae Sauce  . Glaucoma (increased eye pressure)   . Pancreatic cyst   . Adenomatous colon polyp 04/08/2011  . Osteoporosis     Past Surgical History  Procedure Laterality Date  . Ovarian cancer debulking  09/2008  . Hemorrhoid surgery  2001    Family History  Problem Relation Age of Onset  .  Alzheimer's disease Mother 56  . Other Mother 28    TAH for excessive bleeding  . Lung cancer Father     smoker; metastasis to stomach and other areas  . Heart attack Maternal Uncle   . Other Paternal Aunt     stomach issues  . Heart Problems Paternal Uncle   . Other Maternal Grandmother     stomach issues; +hysterectomy  . Heart attack Maternal Grandfather   . Infertility Daughter   . Stomach cancer Cousin     dx. mid-60s  . Other Cousin     stomach issues  . Leukemia Cousin 18  . Stomach cancer Other   . Cancer Cousin     unknown type  . Other Cousin     stomach issues  . Heart Problems Maternal Uncle   . Diabetes Paternal Aunt   . Heart Problems Paternal Uncle   . Emphysema Paternal Uncle     work exposure  . Breast cancer Cousin     dx. late 60s-early 55s    Social History:  reports that she has never smoked. She has never used smokeless tobacco. She reports that she does not drink alcohol or use illicit drugs.    Review of Systems    Lipid history: She has not been on any statin drugs    Lab Results  Component Value Date   CHOL 148 10/20/2015   HDL 33.30* 10/20/2015   LDLCALC 84 10/20/2015   TRIG 155.0* 10/20/2015   CHOLHDL 4 10/20/2015          Most recent foot exam: 2/17  Review of Systems      LABS:  Lab on 01/25/2016  Component Date Value Ref Range Status  . Glucose, Bld 01/25/2016 90  70 - 99 mg/dL Final  . Hgb W1X MFr Bld 01/25/2016 6.3  4.6 - 6.5 % Final   Glycemic Control Guidelines for People with Diabetes:Non Diabetic:  <6%Goal of Therapy: <7%Additional Action Suggested:  >8%     Physical Examination:  BP 134/70 mmHg  Pulse 64  Temp(Src) 98 F (36.7 C)  Resp 16  Ht 5\' 3"  (1.6 m)  Wt 185 lb 12.8 oz (84.278 kg)  BMI 32.92 kg/m2  SpO2 97%        ASSESSMENT/PLAN:  Prediabetes by virtue of having impaired fasting glucose, mild glucose intolerance and A1c below 6.5%.  She apparently also has RETINOPATHY, background and mild,  seen on routine ophthalmologic exam  Since she is not showing any impaired fasting glucose currently her only management needs to be continued weight loss with diet modification and increased exercise which she is planning to do   F/u 3 months   [name] 01/28/2016, 9:31 PM   Note: This office note was prepared with Insurance underwriter. Any transcriptional errors that result from this process are unintentional.

## 2016-02-09 ENCOUNTER — Encounter: Payer: Self-pay | Admitting: Internal Medicine

## 2016-02-09 ENCOUNTER — Ambulatory Visit (INDEPENDENT_AMBULATORY_CARE_PROVIDER_SITE_OTHER): Payer: Medicare Other | Admitting: Internal Medicine

## 2016-02-09 VITALS — BP 124/70 | HR 62 | Ht 63.0 in | Wt 188.0 lb

## 2016-02-09 DIAGNOSIS — K52838 Other microscopic colitis: Secondary | ICD-10-CM | POA: Diagnosis not present

## 2016-02-09 DIAGNOSIS — Z86010 Personal history of colonic polyps: Secondary | ICD-10-CM

## 2016-02-09 DIAGNOSIS — R197 Diarrhea, unspecified: Secondary | ICD-10-CM | POA: Diagnosis not present

## 2016-02-09 MED ORDER — BUDESONIDE 3 MG PO CPEP: 9.0000 mg | ORAL_CAPSULE | Freq: Every day | ORAL | Status: DC

## 2016-02-09 NOTE — Progress Notes (Signed)
HISTORY OF PRESENT ILLNESS:  [name] is a 70 y.o. female with past medical history as listed below. She was evaluated by GI physician assistant 11/11/2015 regarding worsening chronic diarrhea on the background of IBS. Associated fecal soilage. History of adenomatous colon polyps for which she was due surveillance. Previous patient of Dr. Olevia Perches and [place]. I performed complete colonoscopy 12/23/2015. The patient was found to have an advanced transverse colon sessile adenoma which was removed piecemeal with adjacent tattoo. As well diminutive adenomas. A single angiodysplasia noted. Random colon biopsies were performed and revealed microscopic colitis. The patient was placed on budesonide 9 mg daily which she took for 4 weeks. While on the medication significant improvement in diarrhea. Less Lomotil. She stopped the medication 1 week ago. Diarrhea is worsened. She presents today for follow-up. Patient has no new complaints but has multiple questions. She wanted to review her procedure findings in detail.  REVIEW OF SYSTEMS:  All non-GI ROS negative except for anxiety, visual change, muscle cramps, urinary leakage, urinary frequency  Past Medical History  Diagnosis Date  . HTN (hypertension)   . LBP (low back pain)   . Menopause   . Vitamin B12 deficiency   . IBS (irritable bowel syndrome)   . Vitamin D deficiency   . GERD (gastroesophageal reflux disease)   . Ovarian cancer ([place]) 09/2008    Dr Gwyneth Revels  . Glaucoma (increased eye pressure)   . Pancreatic cyst   . Adenomatous colon polyp 04/08/2011  . Osteoporosis     Past Surgical History  Procedure Laterality Date  . Ovarian cancer debulking  09/2008  . Hemorrhoid surgery  2001    Social History [name]  reports that she has never smoked. She has never used smokeless tobacco. She reports that she does not drink alcohol or use illicit drugs.  family history includes Alzheimer's disease (age of onset: 65) in her  mother; Breast cancer in her cousin; Cancer in her cousin; Diabetes in her paternal aunt; Emphysema in her paternal uncle; Heart Problems in her maternal uncle, paternal uncle, and paternal uncle; Heart attack in her maternal grandfather and maternal uncle; Infertility in her daughter; Leukemia (age of onset: 77) in her cousin; Lung cancer in her father; Other in her cousin, cousin, maternal grandmother, and paternal aunt; Other (age of onset: 37) in her mother; Stomach cancer in her cousin and other.  Allergies  Allergen Reactions  . Actonel [Risedronate Sodium]     Upset stomach  . Boniva [Ibandronate Sodium]     cramp  . Calcium Channel Blockers     Upset stomach  . Compazine     Daughter reacts to compazine/pt does not want to take  . Lyrica [Pregabalin]     Dizzy        PHYSICAL EXAMINATION: Vital signs: BP 124/70 mmHg  Pulse 62  Ht 5\' 3"  (1.6 m)  Wt 188 lb (85.276 kg)  BMI 33.31 kg/m2  Constitutional: generally well-appearing, no acute distress Psychiatric: alert and oriented x3, cooperative Abdomen: Not reexamined Neuro: No focal deficits. No asterixis.  ASSESSMENT:  #1. Microscopic colitis. Responded to budesonide, but discontinued prematurely #2. History of IBS #3. History of multiple and advanced adenomas. Piecemeal lesion as described #4. History of GERD   PLAN:  #1. Discussed microscopic colitis in detail #2. Recommend resuming budesonide 9 mg daily for 3 months #3. Okay to use Lomotil on demand #4. Repeat surveillance colonoscopy April 2018 #5. GI follow-up 3 months #6. Reflux precautions and  continue PPI   25 minutes was spent face-to-face with the patient. Greater than 50% a time used for counseling regarding her microscopic colitis, treatment, expectations, and follow-up

## 2016-02-09 NOTE — Patient Instructions (Signed)
We have sent the following medications to your pharmacy for you to pick up at your convenience:  Entocort  Please follow up in 3 months

## 2016-02-15 ENCOUNTER — Other Ambulatory Visit: Payer: Self-pay | Admitting: Internal Medicine

## 2016-02-23 ENCOUNTER — Other Ambulatory Visit: Payer: Self-pay | Admitting: Internal Medicine

## 2016-02-26 NOTE — Telephone Encounter (Signed)
[facility]otil called into [facility]

## 2016-03-18 DIAGNOSIS — L819 Disorder of pigmentation, unspecified: Secondary | ICD-10-CM | POA: Diagnosis not present

## 2016-03-18 DIAGNOSIS — D1801 Hemangioma of skin and subcutaneous tissue: Secondary | ICD-10-CM | POA: Diagnosis not present

## 2016-03-18 DIAGNOSIS — L821 Other seborrheic keratosis: Secondary | ICD-10-CM | POA: Diagnosis not present

## 2016-03-18 DIAGNOSIS — D2272 Melanocytic nevi of left lower limb, including hip: Secondary | ICD-10-CM | POA: Diagnosis not present

## 2016-03-18 DIAGNOSIS — L814 Other melanin hyperpigmentation: Secondary | ICD-10-CM | POA: Diagnosis not present

## 2016-03-18 DIAGNOSIS — D692 Other nonthrombocytopenic purpura: Secondary | ICD-10-CM | POA: Diagnosis not present

## 2016-03-18 DIAGNOSIS — Z85828 Personal history of other malignant neoplasm of skin: Secondary | ICD-10-CM | POA: Diagnosis not present

## 2016-03-21 ENCOUNTER — Telehealth: Payer: Self-pay | Admitting: Oncology

## 2016-03-21 ENCOUNTER — Other Ambulatory Visit (HOSPITAL_BASED_OUTPATIENT_CLINIC_OR_DEPARTMENT_OTHER): Payer: Medicare Other

## 2016-03-21 ENCOUNTER — Other Ambulatory Visit: Payer: Self-pay | Admitting: Oncology

## 2016-03-21 DIAGNOSIS — C5700 Malignant neoplasm of unspecified fallopian tube: Secondary | ICD-10-CM

## 2016-03-21 LAB — CBC WITH DIFFERENTIAL/PLATELET
BASO%: 0.7 % (ref 0.0–2.0)
Basophils Absolute: 0 10*3/uL (ref 0.0–0.1)
EOS%: 1.3 % (ref 0.0–7.0)
Eosinophils Absolute: 0.1 10*3/uL (ref 0.0–0.5)
HCT: 35.6 % (ref 34.8–46.6)
HGB: 11.9 g/dL (ref 11.6–15.9)
LYMPH%: 23.4 % (ref 14.0–49.7)
MCH: 29 pg (ref 25.1–34.0)
MCHC: 33.6 g/dL (ref 31.5–36.0)
MCV: 86.3 fL (ref 79.5–101.0)
MONO#: 0.4 10*3/uL (ref 0.1–0.9)
MONO%: 7 % (ref 0.0–14.0)
NEUT%: 67.6 % (ref 38.4–76.8)
NEUTROS ABS: 4.2 10*3/uL (ref 1.5–6.5)
Platelets: 191 10*3/uL (ref 145–400)
RBC: 4.12 10*6/uL (ref 3.70–5.45)
RDW: 13.9 % (ref 11.2–14.5)
WBC: 6.3 10*3/uL (ref 3.9–10.3)
lymph#: 1.5 10*3/uL (ref 0.9–3.3)

## 2016-03-21 NOTE — Telephone Encounter (Signed)
left msg confirming apt change on 7/24... pt moved to APP  sched per pof, MD schedule blocked due to epic upgrade

## 2016-03-22 ENCOUNTER — Telehealth: Payer: Self-pay

## 2016-03-22 LAB — CA 125: Cancer Antigen (CA) 125: 10.3 U/mL (ref 0.0–38.1)

## 2016-03-22 LAB — CANCER ANTIGEN 125 (PARALLEL TESTING): CA 125: 10 U/mL (ref <35)

## 2016-03-22 NOTE — Telephone Encounter (Signed)
-----   Message from Gordy Levan, MD sent at 03/22/2016  7:58 AM EDT ----- Labs seen and need follow up: please let her know marker is stable in good low range. I have had to ask schedulers to move her apt from next week due to active chemo patient with limited clinic due to EMR upgrade

## 2016-03-22 NOTE — Telephone Encounter (Signed)
Told [name] the results of her CA-125 as noted below by Dr. Marko Plume. Rescheduled pt's appointment with Ned Card from 7-24 to [id number] due to conflicking appointments and pt did not wasn't to be scheduled with Dr. Marko Plume in September.

## 2016-03-23 ENCOUNTER — Other Ambulatory Visit: Payer: Self-pay | Admitting: Oncology

## 2016-03-28 ENCOUNTER — Ambulatory Visit: Payer: Medicare Other | Admitting: Nurse Practitioner

## 2016-03-28 ENCOUNTER — Ambulatory Visit: Payer: Self-pay | Admitting: Oncology

## 2016-03-28 DIAGNOSIS — H04123 Dry eye syndrome of bilateral lacrimal glands: Secondary | ICD-10-CM | POA: Diagnosis not present

## 2016-03-28 DIAGNOSIS — H40011 Open angle with borderline findings, low risk, right eye: Secondary | ICD-10-CM | POA: Diagnosis not present

## 2016-03-28 DIAGNOSIS — H40012 Open angle with borderline findings, low risk, left eye: Secondary | ICD-10-CM | POA: Diagnosis not present

## 2016-03-29 ENCOUNTER — Telehealth: Payer: Self-pay | Admitting: Oncology

## 2016-03-29 ENCOUNTER — Ambulatory Visit (HOSPITAL_BASED_OUTPATIENT_CLINIC_OR_DEPARTMENT_OTHER): Payer: Medicare Other | Admitting: Nurse Practitioner

## 2016-03-29 VITALS — BP 158/63 | HR 89 | Temp 98.3°F | Resp 18 | Ht 63.0 in | Wt 186.8 lb

## 2016-03-29 DIAGNOSIS — R109 Unspecified abdominal pain: Secondary | ICD-10-CM | POA: Diagnosis not present

## 2016-03-29 DIAGNOSIS — C5700 Malignant neoplasm of unspecified fallopian tube: Secondary | ICD-10-CM

## 2016-03-29 DIAGNOSIS — R197 Diarrhea, unspecified: Secondary | ICD-10-CM

## 2016-03-29 NOTE — Telephone Encounter (Signed)
Gave pt cal & avs °

## 2016-03-29 NOTE — Progress Notes (Signed)
  [place] OFFICE PROGRESS NOTE   Diagnosis:  Serous fallopian carcinoma  INTERVAL HISTORY:   [name] returns as scheduled. She was last seen 1 year ago. She reports continued chronic diarrhea and intermittent abdominal pain. She occasionally notes a small amount of rectal bleeding. She attributes this to hemorrhoids. She is followed by Dr. Henrene Pastor. She recently started Entocort. She denies nausea/vomiting. She has a good appetite. No shortness of breath.  Objective:  Vital signs in last 24 hours:  Blood pressure (!) 158/63, pulse 89, temperature 98.3 F (36.8 C), temperature source Oral, resp. rate 18, height 5\' 3"  (1.6 m), weight 186 lb 12.8 oz (84.7 kg), SpO2 100 %.    HEENT: No thrush or ulcers. Lymphatics: No palpable cervical, supra clavicular, axillary or inguinal lymph nodes. Resp: Lungs clear bilaterally. Cardio: Regular rate and rhythm. GI: Abdomen soft and nontender. No organomegaly. No mass. Vascular: No leg edema.   Lab Results:  Lab Results  Component Value Date   WBC 6.3 03/21/2016   HGB 11.9 03/21/2016   HCT 35.6 03/21/2016   MCV 86.3 03/21/2016   PLT 191 03/21/2016   NEUTROABS 4.2 03/21/2016    Imaging:  No results found.  Medications: I have reviewed the patient's current medications.  Assessment/Plan: 1. History of stage IIIc serous fallopian carcinoma 2010; TAH/BSO and omentectomy 10/07/2008, optimal debulking with only residual being small involvement on peritoneal surfaces at completion of surgery. She completed 6 cycles of Taxol/carboplatin 11/12/2008 through 03/03/2009. CA-125 in normal range 03/21/2016. 2. Intermittent diarrhea and abdominal pain. She is followed by Dr. Henrene Pastor.  3. Type 2 diabetes 4. Hypertension, GERD, IBS, glaucoma 5. Obesity 6. Family history of dystonic reactions to Compazine 7. Dense breast tissue 8. Presumed nephrolithiasis April 2016 9. CT findings suggestive of lax pelvic floor  muscles    Disposition: [name] appears stable. She remains in clinical remission from fallopian carcinoma. She will schedule a six-month follow-up visit with Dr. Fermin Schwab. She will return for a follow-up visit with Dr. Marko Plume in one year with a CA-125. She understands to contact the office in the interim with any problems.    Ned Card ANP/GNP-BC   03/29/2016  2:12 PM

## 2016-04-26 ENCOUNTER — Other Ambulatory Visit (INDEPENDENT_AMBULATORY_CARE_PROVIDER_SITE_OTHER): Payer: Medicare Other

## 2016-04-26 DIAGNOSIS — R7303 Prediabetes: Secondary | ICD-10-CM | POA: Diagnosis not present

## 2016-04-26 LAB — HEMOGLOBIN A1C: HEMOGLOBIN A1C: 6.1 % (ref 4.6–6.5)

## 2016-04-26 LAB — BASIC METABOLIC PANEL
BUN: 14 mg/dL (ref 6–23)
CALCIUM: 9 mg/dL (ref 8.4–10.5)
CO2: 31 mEq/L (ref 19–32)
CREATININE: 0.87 mg/dL (ref 0.40–1.20)
Chloride: 104 mEq/L (ref 96–112)
GFR: 68.34 mL/min (ref >60.00)
Glucose, Bld: 109 mg/dL — ABNORMAL HIGH (ref 70–99)
Potassium: 3.7 mEq/L (ref 3.5–5.1)
Sodium: 142 mEq/L (ref 135–145)

## 2016-04-29 ENCOUNTER — Ambulatory Visit (INDEPENDENT_AMBULATORY_CARE_PROVIDER_SITE_OTHER): Payer: Medicare Other | Admitting: Endocrinology

## 2016-04-29 ENCOUNTER — Encounter: Payer: Self-pay | Admitting: Endocrinology

## 2016-04-29 VITALS — BP 134/76 | HR 71 | Ht 63.0 in | Wt 189.0 lb

## 2016-04-29 DIAGNOSIS — E785 Hyperlipidemia, unspecified: Secondary | ICD-10-CM

## 2016-04-29 DIAGNOSIS — R7303 Prediabetes: Secondary | ICD-10-CM | POA: Diagnosis not present

## 2016-04-29 NOTE — Progress Notes (Signed)
Patient ID: [name], female   DOB: [birth year], 70 y.o.   MRN: [id number]           Reason for Appointment: Follow up of prediabetes  Referring physician: Plotnickov  History of Present Illness:          She has prediabetes but also was told to have background retinopathy  On her periodic general checkups she has had an A1c done regularly and these have been upper normal around 6.0  She was evaluated with a glucose tolerance test in 10/2015 which showed the following: Fasting glucose 122, one-hour glucose 202 hour glucose 147  He was referred to the dietitian for meal planning which she has done in 3/17 However her A1c was higher at 6.3 in 5/17 despite her fasting glucose being 89  She thinks she has been recently inconsistent with her diet because of traveling Also has not been motivated to exercise recently Has not lost any weight  A1c is slightly better at 6.1 but her fasting glucose is now 109  Weight history:  Wt Readings from Last 3 Encounters:  04/29/16 189 lb (85.7 kg)  03/29/16 186 lb 12.8 oz (84.7 kg)  02/09/16 188 lb (85.3 kg)    Glycemic levels:   Lab Results  Component Value Date   HGBA1C 6.1 04/26/2016   HGBA1C 6.3 01/25/2016   HGBA1C 6.0 10/20/2015   Lab Results  Component Value Date   LDLCALC 84 10/20/2015   CREATININE 0.87 04/26/2016         Medication List       Accurate as of 04/29/16 10:56 AM. Always use your most recent med list.          aspirin 81 MG EC tablet Take 81 mg by mouth daily.   bimatoprost 0.03 % ophthalmic solution Commonly known as:  LUMIGAN Place 1 drop into both eyes at bedtime.   budesonide 3 MG 24 hr capsule Commonly known as:  ENTOCORT EC Take 3 capsules (9 mg total) by mouth daily.   Cholecalciferol 2000 units Tabs Commonly known as:  VITAMIN D3 SUPER STRENGTH Take 2 tablets (4,000 Units total) by mouth every morning.   clindamycin 1 % lotion Commonly known as:  CLEOCIN T Apply 1 application  topically daily. Reported on 11/26/2015   cloNIDine 0.1 MG tablet Commonly known as:  CATAPRES TAKE 1 TABLET BY MOUTH 2 TIMES DAILY   denosumab 60 MG/ML Soln injection Commonly known as:  PROLIA Inject 60 mg into the skin every 6 (six) months. Administer in upper arm, thigh, or abdomen   diphenoxylate-atropine 2.5-0.025 MG tablet Commonly known as:  LOMOTIL TAKE ONE TABLET BY MOUTH FOUR TIMES DAILY AS NEEDED FOR DIARRHEA OR LOOSE STOOLS   NASCOBAL 500 MCG/0.1ML Soln Generic drug:  Cyanocobalamin Use one spray nasally once  a week   RABEprazole 20 MG tablet Commonly known as:  ACIPHEX Take 1 tablet by mouth  daily   rifaximin 200 MG tablet Commonly known as:  XIFAXAN Take 550 mg by mouth 3 (three) times daily.   timolol 0.5 % ophthalmic gel-forming Commonly known as:  TIMOPTIC-XR Place 1 drop into both eyes 2 (two) times daily.       Allergies:  Allergies  Allergen Reactions  . Actonel [Risedronate Sodium]     Upset stomach  . Boniva [Ibandronate Sodium]     cramp  . Calcium Channel Blockers     Upset stomach  . Compazine     Daughter reacts to compazine/pt does  not want to take  . Lyrica [Pregabalin]     Dizzy     Past Medical History:  Diagnosis Date  . Adenomatous colon polyp 04/08/2011  . GERD (gastroesophageal reflux disease)   . Glaucoma (increased eye pressure)   . HTN (hypertension)   . IBS (irritable bowel syndrome)   . LBP (low back pain)   . Menopause   . Osteoporosis   . Ovarian cancer (HCC) 09/2008   Dr Janae Sauce  . Pancreatic cyst   . Vitamin B12 deficiency   . Vitamin D deficiency     Past Surgical History:  Procedure Laterality Date  . HEMORRHOID SURGERY  2001  . Ovarian Cancer Debulking  09/2008    Family History  Problem Relation Age of Onset  . Alzheimer's disease Mother 11  . Other Mother 87    TAH for excessive bleeding  . Lung cancer Father     smoker; metastasis to stomach and other areas  . Heart attack Maternal Uncle     . Other Paternal Aunt     stomach issues  . Heart Problems Paternal Uncle   . Other Maternal Grandmother     stomach issues; +hysterectomy  . Heart attack Maternal Grandfather   . Infertility Daughter   . Stomach cancer Cousin     dx. mid-60s  . Other Cousin     stomach issues  . Leukemia Cousin 18  . Stomach cancer Other   . Cancer Cousin     unknown type  . Other Cousin     stomach issues  . Heart Problems Maternal Uncle   . Diabetes Paternal Aunt   . Heart Problems Paternal Uncle   . Emphysema Paternal Uncle     work exposure  . Breast cancer Cousin     dx. late 60s-early 82s    Social History:  reports that she has never smoked. She has never used smokeless tobacco. She reports that she does not drink alcohol or use drugs.    Review of Systems    Lipid history: She has not been on any statin drugs    Lab Results  Component Value Date   CHOL 148 10/20/2015   HDL 33.30 (L) 10/20/2015   LDLCALC 84 10/20/2015   TRIG 155.0 (H) 10/20/2015   CHOLHDL 4 10/20/2015          Most recent foot exam: 2/17  Review of Systems    LABS:  Lab on 04/26/2016  Component Date Value Ref Range Status  . Hgb A1c MFr Bld 04/26/2016 6.1  4.6 - 6.5 % Final  . Sodium 04/26/2016 142  135 - 145 mEq/L Final  . Potassium 04/26/2016 3.7  3.5 - 5.1 mEq/L Final  . Chloride 04/26/2016 104  96 - 112 mEq/L Final  . CO2 04/26/2016 31  19 - 32 mEq/L Final  . Glucose, Bld 04/26/2016 109* 70 - 99 mg/dL Final  . BUN 16/06/9603 14  6 - 23 mg/dL Final  . Creatinine, Ser 04/26/2016 0.87  0.40 - 1.20 mg/dL Final  . Calcium 54/05/8118 9.0  8.4 - 10.5 mg/dL Final  . GFR 14/78/2956 68.34  >60.00 mL/min Final    Physical Examination:  BP 134/76   Pulse 71   Ht 5\' 3"  (1.6 m)   Wt 189 lb (85.7 kg)   SpO2 97%   BMI 33.48 kg/m         ASSESSMENT/PLAN:  Prediabetes by virtue of having impaired fasting glucose, mild glucose intolerance and A1c below  6.5%.  She apparently also has  RETINOPATHY, background and mild, seen on routine ophthalmologic exam  Recently her glucose is 109 fasting and A1c upper normal at 6.1  Since she is not able to be consistent with her diet and exercise regimen have recommended that she join the prediabetes/ diabetes prevention program either at the [facility] or [facility]  She will follow-up in January   [facility] 04/29/2016, 10:56 AM   Note: This office note was prepared with Dragon voice recognition system technology. Any transcriptional errors that result from this process are unintentional.

## 2016-05-18 ENCOUNTER — Encounter: Payer: Self-pay | Admitting: Internal Medicine

## 2016-05-18 ENCOUNTER — Ambulatory Visit (INDEPENDENT_AMBULATORY_CARE_PROVIDER_SITE_OTHER): Payer: Medicare Other | Admitting: Internal Medicine

## 2016-05-18 DIAGNOSIS — Z23 Encounter for immunization: Secondary | ICD-10-CM

## 2016-05-18 DIAGNOSIS — E538 Deficiency of other specified B group vitamins: Secondary | ICD-10-CM

## 2016-05-18 DIAGNOSIS — K589 Irritable bowel syndrome without diarrhea: Secondary | ICD-10-CM | POA: Diagnosis not present

## 2016-05-18 DIAGNOSIS — K909 Intestinal malabsorption, unspecified: Secondary | ICD-10-CM

## 2016-05-18 DIAGNOSIS — I10 Essential (primary) hypertension: Secondary | ICD-10-CM | POA: Diagnosis not present

## 2016-05-18 DIAGNOSIS — R739 Hyperglycemia, unspecified: Secondary | ICD-10-CM

## 2016-05-18 DIAGNOSIS — R197 Diarrhea, unspecified: Secondary | ICD-10-CM

## 2016-05-18 DIAGNOSIS — E876 Hypokalemia: Secondary | ICD-10-CM

## 2016-05-18 MED ORDER — POTASSIUM CHLORIDE CRYS ER 10 MEQ PO TBCR: 10.0000 meq | EXTENDED_RELEASE_TABLET | Freq: Two times a day (BID) | ORAL | 11 refills | Status: DC

## 2016-05-18 MED ORDER — PANCRELIPASE (LIP-PROT-AMYL) 36000-114000 UNITS PO CPEP: 36000.0000 [IU] | ORAL_CAPSULE | Freq: Three times a day (TID) | ORAL | 3 refills | Status: DC

## 2016-05-18 NOTE — Assessment & Plan Note (Signed)
On KCl 

## 2016-05-18 NOTE — Assessment & Plan Note (Signed)
Diet and exercise.   

## 2016-05-18 NOTE — Addendum Note (Signed)
Addended by: Cresenciano Lick on: 05/18/2016 04:37 PM   Modules accepted: Orders

## 2016-05-18 NOTE — Assessment & Plan Note (Addendum)
Lomotil prn Welchol, Viberzi, Xifaxan did not work Appt w/Dr Henrene Pastor in 2 wks Try Creon

## 2016-05-18 NOTE — Progress Notes (Signed)
Pre visit review using our clinic review tool, if applicable. No additional management support is needed unless otherwise documented below in the visit note. 

## 2016-05-18 NOTE — Progress Notes (Signed)
Subjective:  Patient ID: [name], female    DOB: [birth year]  Age: 70 y.o. MRN: [id number]  CC: No chief complaint on file.   HPI [name] presents for diarrhea, elevated CBGs, B12 def f/u  Outpatient Medications Prior to Visit  Medication Sig Dispense Refill  . aspirin 81 MG EC tablet Take 81 mg by mouth daily.      . bimatoprost (LUMIGAN) 0.03 % ophthalmic solution Place 1 drop into both eyes at bedtime.      . Cholecalciferol (VITAMIN D3 SUPER STRENGTH) 2000 UNITS TABS Take 2 tablets (4,000 Units total) by mouth every morning. 100 each 3  . clindamycin (CLEOCIN T) 1 % lotion Apply 1 application topically daily. Reported on 11/26/2015    . cloNIDine (CATAPRES) 0.1 MG tablet TAKE 1 TABLET BY MOUTH 2 TIMES DAILY 60 tablet 11  . denosumab (PROLIA) 60 MG/ML SOLN injection Inject 60 mg into the skin every 6 (six) months. Administer in upper arm, thigh, or abdomen    . diphenoxylate-atropine (LOMOTIL) 2.5-0.025 MG tablet TAKE ONE TABLET BY MOUTH FOUR TIMES DAILY AS NEEDED FOR DIARRHEA OR LOOSE STOOLS 60 tablet 2  . NASCOBAL 500 MCG/0.1ML SOLN Use one spray nasally once  a week 12 mL 1  . RABEprazole (ACIPHEX) 20 MG tablet Take 1 tablet by mouth  daily 90 tablet 2  . timolol (TIMOPTIC-XR) 0.5 % ophthalmic gel-forming Place 1 drop into both eyes 2 (two) times daily.     . budesonide (ENTOCORT EC) 3 MG 24 hr capsule Take 3 capsules (9 mg total) by mouth daily. (Patient not taking: Reported on 05/18/2016) 90 capsule 6  . rifaximin (XIFAXAN) 200 MG tablet Take 550 mg by mouth 3 (three) times daily.     No facility-administered medications prior to visit.     ROS Review of Systems  Constitutional: Negative for activity change, appetite change, chills, fatigue and unexpected weight change.  HENT: Negative for congestion, mouth sores and sinus pressure.   Eyes: Negative for visual disturbance.  Respiratory: Negative for cough and chest tightness.   Gastrointestinal: Positive for  diarrhea. Negative for abdominal pain and nausea.  Genitourinary: Negative for difficulty urinating, frequency and vaginal pain.  Musculoskeletal: Negative for back pain and gait problem.  Skin: Negative for pallor and rash.  Neurological: Negative for dizziness, tremors, weakness, numbness and headaches.  Psychiatric/Behavioral: Negative for confusion and sleep disturbance.    Objective:  BP 130/62   Pulse 62   Temp 97.4 F (36.3 C) (Oral)   Wt 189 lb (85.7 kg)   SpO2 97%   BMI 33.48 kg/m   BP Readings from Last 3 Encounters:  05/18/16 130/62  04/29/16 134/76  03/29/16 (!) 158/63    Wt Readings from Last 3 Encounters:  05/18/16 189 lb (85.7 kg)  04/29/16 189 lb (85.7 kg)  03/29/16 186 lb 12.8 oz (84.7 kg)    Physical Exam  Constitutional: She appears well-developed. No distress.  HENT:  Head: Normocephalic.  Right Ear: External ear normal.  Left Ear: External ear normal.  Nose: Nose normal.  Mouth/Throat: Oropharynx is clear and moist.  Eyes: Conjunctivae are normal. Pupils are equal, round, and reactive to light. Right eye exhibits no discharge. Left eye exhibits no discharge.  Neck: Normal range of motion. Neck supple. No JVD present. No tracheal deviation present. No thyromegaly present.  Cardiovascular: Normal rate, regular rhythm and normal heart sounds.   Pulmonary/Chest: No stridor. No respiratory distress. She has no wheezes.  Abdominal:  Soft. Bowel sounds are normal. She exhibits no distension and no mass. There is no tenderness. There is no rebound and no guarding.  Musculoskeletal: She exhibits no edema or tenderness.  Lymphadenopathy:    She has no cervical adenopathy.  Neurological: She displays normal reflexes. No cranial nerve deficit. She exhibits normal muscle tone. Coordination normal.  Skin: No rash noted. No erythema.  Psychiatric: She has a normal mood and affect. Her behavior is normal. Judgment and thought content normal.  obese Anxious    Lab Results  Component Value Date   WBC 6.3 03/21/2016   HGB 11.9 03/21/2016   HCT 35.6 03/21/2016   PLT 191 03/21/2016   GLUCOSE 109 (H) 04/26/2016   CHOL 148 10/20/2015   TRIG 155.0 (H) 10/20/2015   HDL 33.30 (L) 10/20/2015   LDLCALC 84 10/20/2015   ALT 28 10/20/2015   AST 27 10/20/2015   NA 142 04/26/2016   K 3.7 04/26/2016   CL 104 04/26/2016   CREATININE 0.87 04/26/2016   BUN 14 04/26/2016   CO2 31 04/26/2016   TSH 0.91 10/20/2015   HGBA1C 6.1 04/26/2016    Mm Screening Breast Tomo Bilateral  Result Date: 12/29/2015 CLINICAL DATA:  Screening. EXAM: 2D DIGITAL SCREENING BILATERAL MAMMOGRAM WITH CAD AND ADJUNCT TOMO COMPARISON:  Previous exam(s). ACR Breast Density Category b: There are scattered areas of fibroglandular density. FINDINGS: There are no findings suspicious for malignancy. Images were processed with CAD. IMPRESSION: No mammographic evidence of malignancy. A result letter of this screening mammogram will be mailed directly to the patient. RECOMMENDATION: Screening mammogram in one year. (Code:SM-B-01Y) BI-RADS CATEGORY  1: Negative. Electronically Signed   By: Lajean Manes M.D.   On: 12/29/2015 09:55    Assessment & Plan:   There are no diagnoses linked to this encounter. I am having [name] maintain her aspirin, bimatoprost, timolol, denosumab, cloNIDine, clindamycin, Cholecalciferol, rifaximin, RABEprazole, budesonide, NASCOBAL, and diphenoxylate-atropine.  No orders of the defined types were placed in this encounter.    Follow-up: No Follow-up on file.  Walker Kehr, MD

## 2016-05-18 NOTE — Assessment & Plan Note (Signed)
Lomotil prn Welchol, Viberzi, Xifaxan did not work Appt w/Dr Henrene Pastor in 2 wks Try Creon

## 2016-05-18 NOTE — Assessment & Plan Note (Signed)
On B12 

## 2016-05-18 NOTE — Assessment & Plan Note (Signed)
On Catapress

## 2016-05-31 ENCOUNTER — Encounter: Payer: Self-pay | Admitting: Internal Medicine

## 2016-05-31 ENCOUNTER — Ambulatory Visit (INDEPENDENT_AMBULATORY_CARE_PROVIDER_SITE_OTHER): Payer: Medicare Other | Admitting: Internal Medicine

## 2016-05-31 VITALS — BP 138/80 | HR 78 | Ht 63.0 in | Wt 187.2 lb

## 2016-05-31 DIAGNOSIS — F329 Major depressive disorder, single episode, unspecified: Secondary | ICD-10-CM

## 2016-05-31 DIAGNOSIS — R197 Diarrhea, unspecified: Secondary | ICD-10-CM

## 2016-05-31 DIAGNOSIS — K589 Irritable bowel syndrome without diarrhea: Secondary | ICD-10-CM

## 2016-05-31 DIAGNOSIS — K52838 Other microscopic colitis: Secondary | ICD-10-CM | POA: Diagnosis not present

## 2016-05-31 DIAGNOSIS — F32A Depression, unspecified: Secondary | ICD-10-CM

## 2016-05-31 MED ORDER — COLESTIPOL HCL 1 G PO TABS: 2.0000 g | ORAL_TABLET | Freq: Two times a day (BID) | ORAL | 3 refills | Status: DC

## 2016-05-31 NOTE — Progress Notes (Signed)
HISTORY OF PRESENT ILLNESS:  [name] is a 70 y.o. female with past medical history as listed below. She was evaluated by the GI physician assistant 11/11/2015 regarding worsening chronic diarrhea on a background of IBS. Fecal soilage as well. Previous patient of Dr. Olevia Perches and [place]. I performed complete colonoscopy 12/23/2015. Patient was found to have an advanced transverse colon sessile adenoma which was removed piecemeal with adjacent tattoo placed. Diminutive adenoma and single AVM as well. One year follow-up recommended. Random colon biopsies were work performed and revealed microscopic colitis. Lymphocytic version. She was placed on budesonide 9 mg daily which she took for 4 weeks. While in the medication she reported significant improvement in diarrhea and required less Lomotil. When she stopped the medication 1 week prior to her last follow-up 02/09/2016 she reported that her diarrhea was worse. See that dictation for details. We resume budesonide 9 mg daily, permitted Lomotil as needed, and recommended three-month follow-up. Patient reports to me that she took budesonide 9 mg daily for 2 months but noticed no improvement in her diarrhea. Therefore, she stopped the medication. She still has occasional fecal soilage. She seems distraught. Takes approximately 2 Lomotil per day. Does not take regularly but rather after episodes of diarrhea. No constipation. Has tried WelChol remotely with transient relief. Prescribed Creon by Dr. [name], which she has not started. Multiple questions and concerns.  REVIEW OF SYSTEMS:  All non-GI ROS negative except for urinary leakage  Past Medical History:  Diagnosis Date  . Adenomatous colon polyp 04/08/2011  . GERD (gastroesophageal reflux disease)   . Glaucoma (increased eye pressure)   . HTN (hypertension)   . IBS (irritable bowel syndrome)   . LBP (low back pain)   . Menopause   . Osteoporosis   . Ovarian cancer ([place]) 09/2008   Dr Gwyneth Revels   . Pancreatic cyst   . Vitamin B12 deficiency   . Vitamin D deficiency     Past Surgical History:  Procedure Laterality Date  . [place]  2001  . Ovarian Cancer Debulking  09/2008    Social History SYDNA THACKERY  reports that she has never smoked. She has never used smokeless tobacco. She reports that she does not drink alcohol or use drugs.  family history includes Alzheimer's disease (age of onset: 54) in her mother; Breast cancer in her cousin; Cancer in her cousin; Diabetes in her paternal aunt; Emphysema in her paternal uncle; Heart Problems in her maternal uncle, paternal uncle, and paternal uncle; Heart attack in her maternal grandfather and maternal uncle; Infertility in her daughter; Leukemia (age of onset: 84) in her cousin; Lung cancer in her father; Other in her cousin, cousin, maternal grandmother, and paternal aunt; Other (age of onset: 29) in her mother; Stomach cancer in her cousin and other.  Allergies  Allergen Reactions  . Actonel [Risedronate Sodium]     Upset stomach  . Boniva [Ibandronate Sodium]     cramp  . Calcium Channel Blockers     Upset stomach  . Compazine     Daughter reacts to compazine/pt does not want to take  . Lyrica [Pregabalin]     Dizzy        PHYSICAL EXAMINATION: Vital signs: BP 138/80   Pulse 78   Ht 5\' 3"  (1.6 m)   Wt 187 lb 4 oz (84.9 kg)   BMI 33.17 kg/m   Constitutional: generally well-appearing, no acute distress Psychiatric: alert and oriented x3, cooperative Eyes: extraocular movements intact, anicteric,  conjunctiva pink Mouth: oral pharynx moist, no lesions Neck: supple no lymphadenopathy Cardiovascular: heart regular rate and rhythm, no murmur Lungs: clear to auscultation bilaterally Abdomen: soft, nontender, nondistended, no obvious ascites, no peritoneal signs, normal bowel sounds, no organomegaly Rectal: Omitted Extremities: no clubbing cyanosis or lower extremity edema bilaterally Skin: no lesions  on visible extremities Neuro: No focal deficits. Cranial nerves intact  ASSESSMENT:  #1. Chronic diarrhea. History of lymphocytic colitis. History of IBS. Difficult to sort out if diarrhea is related to one or the other, or both. #2. History of advanced adenoma removed piecemeal April 2017 #3. History of GERD. On chronic AcipHex.   PLAN:  #1. Recommended taking Lomotil regularly. We will start with one twice daily. May adjust if needed #2. Prescribed and initiate Colestid 2 g twice daily. Do not take within 2 hours of other medications #3. Routine office follow-up 3 months. Contact the office in the interim for questions or problems #4. We will hold Creon for now. May try later #5. Surveillance colonoscopy April 2018  25 minutes was spent face-to-face with the patient. The overwhelming majority of the time was spent counseling regarding her chronic diarrhea, possible causes, treatment recommendations as well as follow-up

## 2016-05-31 NOTE — Patient Instructions (Signed)
We have sent the following medications to your pharmacy for you to pick up at your convenience:  Colestid  As directed by Dr. Henrene Pastor, take Lomotil twice a day, hold for severe constipation  Please follow up on 08/25/2016 8:30am

## 2016-07-04 ENCOUNTER — Telehealth: Payer: Self-pay

## 2016-07-04 NOTE — Telephone Encounter (Signed)
Patients next prolia injection is due either on/after 07/09/16----I am verifying insurance benefits and will call patient to schedule appt----can talk with Brianne Maina if any questions

## 2016-07-13 ENCOUNTER — Ambulatory Visit (INDEPENDENT_AMBULATORY_CARE_PROVIDER_SITE_OTHER): Payer: Medicare Other

## 2016-07-13 DIAGNOSIS — M810 Age-related osteoporosis without current pathological fracture: Secondary | ICD-10-CM

## 2016-07-13 MED ORDER — DENOSUMAB 60 MG/ML ~~LOC~~ SOLN
60.0000 mg | Freq: Once | SUBCUTANEOUS | Status: AC
  Administered 2016-07-13: 60 mg via SUBCUTANEOUS

## 2016-08-25 ENCOUNTER — Encounter: Payer: Self-pay | Admitting: Internal Medicine

## 2016-08-25 ENCOUNTER — Ambulatory Visit (INDEPENDENT_AMBULATORY_CARE_PROVIDER_SITE_OTHER): Payer: Medicare Other | Admitting: Internal Medicine

## 2016-08-25 VITALS — BP 130/70 | HR 76 | Ht 63.0 in | Wt 187.2 lb

## 2016-08-25 DIAGNOSIS — K52838 Other microscopic colitis: Secondary | ICD-10-CM

## 2016-08-25 DIAGNOSIS — K219 Gastro-esophageal reflux disease without esophagitis: Secondary | ICD-10-CM

## 2016-08-25 DIAGNOSIS — R197 Diarrhea, unspecified: Secondary | ICD-10-CM

## 2016-08-25 DIAGNOSIS — K580 Irritable bowel syndrome with diarrhea: Secondary | ICD-10-CM | POA: Diagnosis not present

## 2016-08-25 DIAGNOSIS — D122 Benign neoplasm of ascending colon: Secondary | ICD-10-CM

## 2016-08-25 MED ORDER — DIPHENOXYLATE-ATROPINE 2.5-0.025 MG PO TABS: 1.0000 | ORAL_TABLET | Freq: Four times a day (QID) | ORAL | 3 refills | Status: DC | PRN

## 2016-08-25 MED ORDER — RABEPRAZOLE SODIUM 20 MG PO TBEC: 20.0000 mg | DELAYED_RELEASE_TABLET | Freq: Every day | ORAL | 3 refills | Status: DC

## 2016-08-25 MED ORDER — COLESTIPOL HCL 1 G PO TABS: 2.0000 g | ORAL_TABLET | Freq: Two times a day (BID) | ORAL | 3 refills | Status: DC

## 2016-08-25 NOTE — Progress Notes (Signed)
HISTORY OF PRESENT ILLNESS:  [name] is a 70 y.o. female , previous patient of Dr. Olevia Perches, who established with me last year. GI diagnoses include advanced transverse colon adenoma removed piecemeal and tattooed, incidental colonic AVM, and biopsy proven microscopic (lymphocytic) colitis. Also, GERD and IBS. Last evaluated in the office 05/31/2016 at which point she was having difficulties with diarrhea and incontinence. See that dictation. She was instructed to take Lomotil regularly and she was prescribed Colestid 2 g twice daily. Titration strategies discussed. She presents today for scheduled follow-up. Patient reports significant improvement in her symptom complex. Bowels are soft but no diarrhea. She will explain several bowel movements in the a.m. and then do well thereafter. She is taking Lomotil one twice daily. She reduced Colestid to 1 g twice daily as the higher dose caused constipation. No problems with incontinence. She does request refill of her GI medications. No other complaints. She is due for her follow-up colonoscopy in April. Remote EGD with esophageal dilation for esophageal stricture and hiatal hernia 2005 with Dr. Olevia Perches. She takes AcipHex for her GERD. No rectum symptoms. No recurrent dysphagia. Interval blood work and data reviewed.  REVIEW OF SYSTEMS:  All non-GI ROS negative upon review  Past Medical History:  Diagnosis Date  . Adenomatous colon polyp 04/08/2011  . GERD (gastroesophageal reflux disease)   . Glaucoma (increased eye pressure)   . HTN (hypertension)   . IBS (irritable bowel syndrome)   . LBP (low back pain)   . Menopause   . Osteoporosis   . Ovarian cancer ([place]) 09/2008   Dr Gwyneth Revels  . Pancreatic cyst   . Vitamin B12 deficiency   . Vitamin D deficiency     Past Surgical History:  Procedure Laterality Date  . [place]  2001  . Ovarian Cancer Debulking  09/2008    Social History ADDALYNN SOKOLOV  reports that she has never  smoked. She has never used smokeless tobacco. She reports that she does not drink alcohol or use drugs.  family history includes Alzheimer's disease (age of onset: 43) in her mother; Breast cancer in her cousin; Cancer in her cousin; Diabetes in her paternal aunt; Emphysema in her paternal uncle; Heart Problems in her maternal uncle, paternal uncle, and paternal uncle; Heart attack in her maternal grandfather and maternal uncle; Infertility in her daughter; Leukemia (age of onset: 10) in her cousin; Lung cancer in her father; Other in her cousin, cousin, maternal grandmother, and paternal aunt; Other (age of onset: 54) in her mother; Stomach cancer in her cousin and other.  Allergies  Allergen Reactions  . Actonel [Risedronate Sodium]     Upset stomach  . Boniva [Ibandronate Sodium]     cramp  . Calcium Channel Blockers     Upset stomach  . Compazine     Daughter reacts to compazine/pt does not want to take  . Lyrica [Pregabalin]     Dizzy        PHYSICAL EXAMINATION: Vital signs: BP 130/70 (BP Location: Left Arm, Patient Position: Sitting, Cuff Size: Normal)   Pulse 76   Ht 5\' 3"  (1.6 m)   Wt 187 lb 3.2 oz (84.9 kg)   BMI 33.16 kg/m  General: Well-developed, well-nourished, no acute distress HEENT: Sclerae are anicteric, conjunctiva pink. Oral mucosa intact Lungs: Clear Heart: Regular Abdomen: soft, nontender, nondistended, no obvious ascites, no peritoneal signs, normal bowel sounds. No organomegaly. Extremities: No cyanosis or edema Psychiatric: alert and oriented x3. Cooperative  ASSESSMENT:  #  1. Lymphocytic colitis. #2. Diarrhea predominant IBS #3. Recent problems with diarrhea and incontinence secondary to #1 and/or #2 above. Improved on current regimen of Colestid 1 g twice daily and Lomotil one twice daily #4. GERD with history peptic stricture requiring dilation remotely. Asymptomatic on PPI. Request refill #5. Advanced adenoma of the colon removed piecemeal April  2017. Due for surveillance April 2018  PLAN:  #1. Refill Colestid #2. Refill Lomotil #3. Refill AcipHex and strict adherence to reflux precautions with attention to weight loss #4. Colonoscopy in April as planned #5. Interval follow-up as needed. #6. Resume general care with PCP  25 minutes was spent face-to-face with the patient. Greater than 50% of the time was use for counseling regarding her issues with diarrhea, GERD treatment, and plans for adenoma surveillance

## 2016-08-25 NOTE — Patient Instructions (Signed)
We have sent the following medications to your pharmacy for you to pick up at your convenience: Lomotil  Aciphex Colestid

## 2016-09-05 HISTORY — PX: COLONOSCOPY: SHX174

## 2016-09-26 ENCOUNTER — Other Ambulatory Visit (INDEPENDENT_AMBULATORY_CARE_PROVIDER_SITE_OTHER): Payer: Medicare Other

## 2016-09-26 DIAGNOSIS — E785 Hyperlipidemia, unspecified: Secondary | ICD-10-CM

## 2016-09-26 DIAGNOSIS — H40013 Open angle with borderline findings, low risk, bilateral: Secondary | ICD-10-CM | POA: Diagnosis not present

## 2016-09-26 DIAGNOSIS — R7303 Prediabetes: Secondary | ICD-10-CM

## 2016-09-26 DIAGNOSIS — H2513 Age-related nuclear cataract, bilateral: Secondary | ICD-10-CM | POA: Diagnosis not present

## 2016-09-26 DIAGNOSIS — E119 Type 2 diabetes mellitus without complications: Secondary | ICD-10-CM | POA: Diagnosis not present

## 2016-09-26 DIAGNOSIS — H52203 Unspecified astigmatism, bilateral: Secondary | ICD-10-CM | POA: Diagnosis not present

## 2016-09-26 LAB — HM DIABETES EYE EXAM

## 2016-09-26 LAB — COMPREHENSIVE METABOLIC PANEL
ALBUMIN: 4 g/dL (ref 3.5–5.2)
ALT: 17 U/L (ref 0–35)
AST: 18 U/L (ref 0–37)
Alkaline Phosphatase: 59 U/L (ref 39–117)
BUN: 14 mg/dL (ref 6–23)
CALCIUM: 9.1 mg/dL (ref 8.4–10.5)
CHLORIDE: 104 meq/L (ref 96–112)
CO2: 29 meq/L (ref 19–32)
CREATININE: 0.78 mg/dL (ref 0.40–1.20)
GFR: 77.43 mL/min (ref >60.00)
Glucose, Bld: 114 mg/dL — ABNORMAL HIGH (ref 70–99)
POTASSIUM: 3.5 meq/L (ref 3.5–5.1)
Sodium: 142 mEq/L (ref 135–145)
Total Bilirubin: 0.6 mg/dL (ref 0.2–1.2)
Total Protein: 7.4 g/dL (ref 6.0–8.3)

## 2016-09-26 LAB — HEMOGLOBIN A1C: Hgb A1c MFr Bld: 6 % (ref 4.6–6.5)

## 2016-09-26 LAB — LIPID PANEL
CHOLESTEROL: 170 mg/dL (ref 0–200)
HDL: 40.1 mg/dL (ref >39.00)
LDL CALC: 101 mg/dL — AB (ref 0–99)
NonHDL: 130.38
TRIGLYCERIDES: 148 mg/dL (ref 0.0–149.0)
Total CHOL/HDL Ratio: 4
VLDL: 29.6 mg/dL (ref 0.0–40.0)

## 2016-09-29 ENCOUNTER — Ambulatory Visit (INDEPENDENT_AMBULATORY_CARE_PROVIDER_SITE_OTHER): Payer: Medicare Other | Admitting: Endocrinology

## 2016-09-29 ENCOUNTER — Encounter: Payer: Self-pay | Admitting: Endocrinology

## 2016-09-29 VITALS — BP 128/80 | HR 72 | Ht 63.0 in | Wt 188.0 lb

## 2016-09-29 DIAGNOSIS — R7303 Prediabetes: Secondary | ICD-10-CM | POA: Diagnosis not present

## 2016-09-29 NOTE — Progress Notes (Signed)
Patient ID: [name], female   DOB: 1945/10/24, 71 y.o.   MRN: [id number]           Reason for Appointment: Follow up of prediabetes  Referring physician: [name]  History of Present Illness:          She has prediabetes and also was told to have background retinopathy  On her periodic general checkups she has had an A1c done regularly and these have been upper normal around 6.0  She was evaluated with a glucose tolerance test in 10/2015 which showed the following: Fasting glucose 122, one-hour glucose 202 hour glucose 147  She was referred to the dietitian for meal planning which she has done in 3/17  However she has not been able to lose any weight, has been somewhat inconsistent with her diet and not planning her meals well Still not motivated to exercise She was asked to join the diabetes prevention program at the [facility] last time but she did not do so  However her A1c is relatively better at 6% Fasting glucose is 114, previously 109  Weight history:  Wt Readings from Last 3 Encounters:  09/29/16 188 lb (85.3 kg)  08/25/16 187 lb 3.2 oz (84.9 kg)  05/31/16 187 lb 4 oz (84.9 kg)    Glycemic levels:   Lab Results  Component Value Date   HGBA1C 6.0 09/26/2016   HGBA1C 6.1 04/26/2016   HGBA1C 6.3 01/25/2016   Lab Results  Component Value Date   LDLCALC 101 (H) 09/26/2016   CREATININE 0.78 09/26/2016       Allergies as of 09/29/2016      Reactions   Actonel [risedronate Sodium]    Upset stomach   Boniva [ibandronate Sodium]    cramp   Calcium Channel Blockers    Upset stomach   Compazine    Daughter reacts to compazine/pt does not want to take   Lyrica [pregabalin]    Dizzy      Medication List       Accurate as of 09/29/16 10:38 AM. Always use your most recent med list.          aspirin 81 MG EC tablet Take 81 mg by mouth daily.   bimatoprost 0.03 % ophthalmic solution Commonly known as:  LUMIGAN Place 1 drop into both eyes at  bedtime.   Cholecalciferol 2000 units Tabs Commonly known as:  VITAMIN D3 SUPER STRENGTH Take 2 tablets (4,000 Units total) by mouth every morning.   clindamycin 1 % lotion Commonly known as:  CLEOCIN T Apply 1 application topically daily. Reported on 11/26/2015   cloNIDine 0.1 MG tablet Commonly known as:  CATAPRES TAKE 1 TABLET BY MOUTH 2 TIMES DAILY   colestipol 1 g tablet Commonly known as:  COLESTID Take 2 tablets (2 g total) by mouth 2 (two) times daily.   colestipol 1 g tablet Commonly known as:  COLESTID 1 tablet 2 (two) times daily.   denosumab 60 MG/ML Soln injection Commonly known as:  PROLIA Inject 60 mg into the skin every 6 (six) months. Administer in upper arm, thigh, or abdomen   diphenoxylate-atropine 2.5-0.025 MG tablet Commonly known as:  LOMOTIL Take 1 tablet by mouth 4 (four) times daily as needed for diarrhea or loose stools.   NASCOBAL 500 MCG/0.1ML Soln Generic drug:  Cyanocobalamin Use one spray nasally once  a week   potassium chloride 10 MEQ tablet Commonly known as:  K-DUR,KLOR-CON Take 1 tablet (10 mEq total) by mouth 2 (two)  times daily.   RABEprazole 20 MG tablet Commonly known as:  ACIPHEX Take 1 tablet (20 mg total) by mouth daily.   timolol 0.5 % ophthalmic gel-forming Commonly known as:  TIMOPTIC-XR Place 1 drop into both eyes 2 (two) times daily.       Allergies:  Allergies  Allergen Reactions  . Actonel [Risedronate Sodium]     Upset stomach  . Boniva [Ibandronate Sodium]     cramp  . Calcium Channel Blockers     Upset stomach  . Compazine     Daughter reacts to compazine/pt does not want to take  . Lyrica [Pregabalin]     Dizzy     Past Medical History:  Diagnosis Date  . Adenomatous colon polyp 04/08/2011  . GERD (gastroesophageal reflux disease)   . Glaucoma (increased eye pressure)   . HTN (hypertension)   . IBS (irritable bowel syndrome)   . LBP (low back pain)   . Menopause   . Osteoporosis   .  Ovarian cancer ([place]) 09/2008   Dr Gwyneth Revels  . Pancreatic cyst   . Vitamin B12 deficiency   . Vitamin D deficiency     Past Surgical History:  Procedure Laterality Date  . [place]  2001  . Ovarian Cancer Debulking  09/2008    Family History  Problem Relation Age of Onset  . Alzheimer's disease Mother 71  . Other Mother 98    TAH for excessive bleeding  . Lung cancer Father     smoker; metastasis to stomach and other areas  . Heart attack Maternal Uncle   . Other Paternal Aunt     stomach issues  . Heart Problems Paternal Uncle   . Other Maternal Grandmother     stomach issues; +hysterectomy  . Heart attack Maternal Grandfather   . Infertility Daughter   . Stomach cancer Cousin     dx. mid-60s  . Other Cousin     stomach issues  . Leukemia Cousin 18  . Stomach cancer Other   . Cancer Cousin     unknown type  . Other Cousin     stomach issues  . Heart Problems Maternal Uncle   . Diabetes Paternal Aunt   . Heart Problems Paternal Uncle   . Emphysema Paternal Uncle     work exposure  . Breast cancer Cousin     dx. late 60s-early 66s    Social History:  reports that she has never smoked. She has never used smokeless tobacco. She reports that she does not drink alcohol or use drugs.    Review of Systems   On Prolia from PCP For osteopenia   Lipid history: She has not been on any statin drugs, is taking Colestid for GI problems However LDL appears to be trending higher    Lab Results  Component Value Date   CHOL 170 09/26/2016   HDL 40.10 09/26/2016   LDLCALC 101 (H) 09/26/2016   TRIG 148.0 09/26/2016   CHOLHDL 4 09/26/2016        RETINOPATHY: She was told by her ophthalmologist that her retinopathy changes are relatively better recently  Most recent foot exam: 2/17  Review of Systems  Gastrointestinal: Positive for diarrhea.      LABS:  Lab on 09/26/2016  Component Date Value Ref Range Status  . Hgb A1c MFr Bld 09/26/2016 6.0  4.6 - 6.5  % Final  . Sodium 09/26/2016 142  135 - 145 mEq/L Final  . Potassium 09/26/2016 3.5  3.5 - 5.1 mEq/L Final  . Chloride 09/26/2016 104  96 - 112 mEq/L Final  . CO2 09/26/2016 29  19 - 32 mEq/L Final  . Glucose, Bld 09/26/2016 114* 70 - 99 mg/dL Final  . BUN 09/26/2016 14  6 - 23 mg/dL Final  . Creatinine, Ser 09/26/2016 0.78  0.40 - 1.20 mg/dL Final  . Total Bilirubin 09/26/2016 0.6  0.2 - 1.2 mg/dL Final  . Alkaline Phosphatase 09/26/2016 59  39 - 117 U/L Final  . AST 09/26/2016 18  0 - 37 U/L Final  . ALT 09/26/2016 17  0 - 35 U/L Final  . Total Protein 09/26/2016 7.4  6.0 - 8.3 g/dL Final  . Albumin 09/26/2016 4.0  3.5 - 5.2 g/dL Final  . Calcium 09/26/2016 9.1  8.4 - 10.5 mg/dL Final  . GFR 09/26/2016 77.43  >60.00 mL/min Final  . Cholesterol 09/26/2016 170  0 - 200 mg/dL Final  . Triglycerides 09/26/2016 148.0  0.0 - 149.0 mg/dL Final  . HDL 09/26/2016 40.10  >39.00 mg/dL Final  . VLDL 09/26/2016 29.6  0.0 - 40.0 mg/dL Final  . LDL Cholesterol 09/26/2016 101* 0 - 99 mg/dL Final  . Total CHOL/HDL Ratio 09/26/2016 4   Final  . NonHDL 09/26/2016 130.38   Final    Physical Examination:  BP 128/80   Pulse 72   Ht 5\' 3"  (1.6 m)   Wt 188 lb (85.3 kg)   SpO2 98%   BMI 33.30 kg/m         ASSESSMENT/PLAN:  PREDIABETES: Her A1c has been around 6% mouth relatively stable now However fasting glucoses 114, relatively higher She has not lost weight and is not committed to changing her lifestyle with diet and exercise changes as instructed repeatedly  Again discussed the benefits of the prediabetes management program set up at the [facility] or the hospital Given her a flyer on the [facility] program with the contact information Discussed that she needs a structured program for compliance Not a candidate for metformin at this time  HYPERCHOLESTEROLEMIA: LDL is just over 100, may improve with improved diet  She will follow-up in 4 months again   [facility] 09/29/2016, 10:38 AM   Note:  This office note was prepared with Estate agent. Any transcriptional errors that result from this process are unintentional.

## 2016-10-13 ENCOUNTER — Encounter: Payer: Self-pay | Admitting: Internal Medicine

## 2016-10-13 NOTE — Progress Notes (Signed)
Result abstracted 

## 2016-10-24 ENCOUNTER — Ambulatory Visit: Payer: Medicare Other | Attending: Gynecology | Admitting: Gynecology

## 2016-10-24 ENCOUNTER — Encounter: Payer: Self-pay | Admitting: Gynecology

## 2016-10-24 VITALS — BP 147/67 | HR 83 | Temp 97.7°F | Resp 18 | Ht 63.0 in | Wt 187.7 lb

## 2016-10-24 DIAGNOSIS — E538 Deficiency of other specified B group vitamins: Secondary | ICD-10-CM | POA: Insufficient documentation

## 2016-10-24 DIAGNOSIS — E559 Vitamin D deficiency, unspecified: Secondary | ICD-10-CM | POA: Insufficient documentation

## 2016-10-24 DIAGNOSIS — K529 Noninfective gastroenteritis and colitis, unspecified: Secondary | ICD-10-CM | POA: Diagnosis not present

## 2016-10-24 DIAGNOSIS — H409 Unspecified glaucoma: Secondary | ICD-10-CM | POA: Diagnosis not present

## 2016-10-24 DIAGNOSIS — Z7982 Long term (current) use of aspirin: Secondary | ICD-10-CM | POA: Insufficient documentation

## 2016-10-24 DIAGNOSIS — Z8544 Personal history of malignant neoplasm of other female genital organs: Secondary | ICD-10-CM | POA: Insufficient documentation

## 2016-10-24 DIAGNOSIS — K580 Irritable bowel syndrome with diarrhea: Secondary | ICD-10-CM | POA: Insufficient documentation

## 2016-10-24 DIAGNOSIS — K219 Gastro-esophageal reflux disease without esophagitis: Secondary | ICD-10-CM | POA: Diagnosis not present

## 2016-10-24 DIAGNOSIS — I10 Essential (primary) hypertension: Secondary | ICD-10-CM | POA: Diagnosis not present

## 2016-10-24 DIAGNOSIS — C5700 Malignant neoplasm of unspecified fallopian tube: Secondary | ICD-10-CM

## 2016-10-24 DIAGNOSIS — Z8543 Personal history of malignant neoplasm of ovary: Secondary | ICD-10-CM | POA: Diagnosis not present

## 2016-10-24 NOTE — Progress Notes (Signed)
Consult Note: Gyn-Onc   [name] 71 y.o. female  Chief Complaint  Patient presents with  . Fallopian Tube Carcinoma   Assessment: Stage III fallopian tube cancer 2010. Clinically free of disease. Chronic diarrhea.  Plan  the patient return to see [place] in one year. At this juncture we will discontinue monitoring CA-125 values. .  Interval History Since her last visit the patient has done well. She has had continuing difficulties over the past several months with diarrhea. In fact, today she's had 3 episodes are ready and feel somewhat lightheaded..  She is under the care of Dr. Olevia Perches for this problem.Otherwise she has no other GI or GU symptoms. Functional status is excellent. She denies any breast symptoms. She is up-to-date with mammograms.[name]   HPI: Stage III fallopian tube carcinoma undergoing initial surgical debulking February 2010. She was optimally debulked with minimal disease on peritoneal surfaces. She received 6 cycles of carboplatin and Taxol chemotherapy completed in June 2010. He CT scan and a PET scan at that time showed no evidence disease. The patient has been followed since that time with normal exams and CA 125 values.    Review of Systems:10 point review of systems is negative as noted above.   Vitals: Blood pressure (!) 147/67, pulse 83, temperature 97.7 F (36.5 C), temperature source Oral, resp. rate 18, height 5\' 3"  (1.6 m), weight 187 lb 11.2 oz (85.1 kg), SpO2 97 %.  Physical Exam: General : The patient is a healthy woman in no acute distress.  HEENT: normocephalic, extraoccular movements normal; neck is supple without thyromegally  Lynphnodes: Supraclavicular, axillary and inguinal nodes not enlarged   Breasts are without masses skin changes or nipple discharge. Abdomen: Soft, non-tender, no ascites, no organomegally, no masses, no hernias  Pelvic:  EGBUS: Normal female  Vagina: Patient has a grade 1-2 cystocele and rectocele. Urethra and Bladder:  Normal, non-tender, cystocele.  Cervix: Surgically absent  Uterus: Surgically absent  Bi-manual examination: Non-tender; no adenxal masses or nodularity  Rectal: normal sphincter tone, no masses, no blood  Lower extremities: No edema or varicosities. Normal range of motion     Allergies  Allergen Reactions  . Actonel [Risedronate Sodium]     Upset stomach  . Boniva [Ibandronate Sodium]     cramp  . Calcium Channel Blockers     Upset stomach  . Compazine     Daughter reacts to compazine/pt does not want to take  . Lyrica [Pregabalin]     Dizzy     Past Medical History:  Diagnosis Date  . Adenomatous colon polyp 04/08/2011  . GERD (gastroesophageal reflux disease)   . Glaucoma (increased eye pressure)   . HTN (hypertension)   . IBS (irritable bowel syndrome)   . LBP (low back pain)   . Menopause   . Osteoporosis   . Ovarian cancer ([place]) 09/2008   Dr Gwyneth Revels  . Pancreatic cyst   . Vitamin B12 deficiency   . Vitamin D deficiency     Past Surgical History:  Procedure Laterality Date  . [place]  2001  . Ovarian Cancer Debulking  09/2008    Current Outpatient Prescriptions  Medication Sig Dispense Refill  . aspirin 81 MG EC tablet Take 81 mg by mouth daily.      . bimatoprost (LUMIGAN) 0.03 % ophthalmic solution Place 1 drop into both eyes at bedtime.      . Cholecalciferol (VITAMIN D3 SUPER STRENGTH) 2000 UNITS TABS Take 2 tablets (4,000 Units total)  by mouth every morning. 100 each 3  . clindamycin (CLEOCIN T) 1 % lotion Apply 1 application topically daily. Reported on 11/26/2015    . cloNIDine (CATAPRES) 0.1 MG tablet TAKE 1 TABLET BY MOUTH 2 TIMES DAILY 60 tablet 11  . colestipol (COLESTID) 1 g tablet Take 2 tablets (2 g total) by mouth 2 (two) times daily. 120 tablet 3  . colestipol (COLESTID) 1 g tablet 1 tablet 2 (two) times daily.    [name] denosumab (PROLIA) 60 MG/ML SOLN injection Inject 60 mg into the skin every 6 (six) months. Administer in upper arm,  thigh, or abdomen    . diphenoxylate-atropine (LOMOTIL) 2.5-0.025 MG tablet Take 1 tablet by mouth 4 (four) times daily as needed for diarrhea or loose stools. (Patient taking differently: Take 1 tablet by mouth 2 (two) times daily. ) 60 tablet 3  . NASCOBAL 500 MCG/0.1ML SOLN Use one spray nasally once  a week 12 mL 1  . potassium chloride (K-DUR,KLOR-CON) 10 MEQ tablet Take 1 tablet (10 mEq total) by mouth 2 (two) times daily. (Patient taking differently: Take 10 mEq by mouth daily. ) 30 tablet 11  . RABEprazole (ACIPHEX) 20 MG tablet Take 1 tablet (20 mg total) by mouth daily. 90 tablet 3  . timolol (TIMOPTIC-XR) 0.5 % ophthalmic gel-forming Place 1 drop into both eyes 2 (two) times daily.      No current facility-administered medications for this visit.     Social History   Social History  . Marital status: Married    Spouse name: Dominica Severin  . Number of children: 2  . Years of education: N/A   Occupational History  . Homemaker Homemaker   Social History Main Topics  . Smoking status: Never Smoker  . Smokeless tobacco: Never Used  . Alcohol use No  . Drug use: No  . Sexual activity: Not on file   Other Topics Concern  . Not on file   Social History Narrative  . No narrative on file    Family History  Problem Relation Age of Onset  . Alzheimer's disease Mother 60  . Other Mother 64    TAH for excessive bleeding  . Lung cancer Father     smoker; metastasis to stomach and other areas  . Heart attack Maternal Uncle   . Other Paternal Aunt     stomach issues  . Heart Problems Paternal Uncle   . Other Maternal Grandmother     stomach issues; +hysterectomy  . Heart attack Maternal Grandfather   . Infertility Daughter   . Stomach cancer Cousin     dx. mid-60s  . Other Cousin     stomach issues  . Leukemia Cousin 18  . Stomach cancer Other   . Cancer Cousin     unknown type  . Other Cousin     stomach issues  . Heart Problems Maternal Uncle   . Diabetes Paternal Aunt    . Heart Problems Paternal Uncle   . Emphysema Paternal Uncle     work exposure  . Breast cancer Cousin     dx. late 60s-early 70s      Marti Sleigh, MD 10/24/2016, 11:50 AM

## 2016-10-24 NOTE — Patient Instructions (Signed)
Call in October to schedule yearly appointment for February 2019.

## 2016-11-01 ENCOUNTER — Encounter: Payer: Self-pay | Admitting: Internal Medicine

## 2016-11-02 ENCOUNTER — Encounter: Payer: Self-pay | Admitting: Internal Medicine

## 2016-11-16 ENCOUNTER — Other Ambulatory Visit (INDEPENDENT_AMBULATORY_CARE_PROVIDER_SITE_OTHER): Payer: Medicare Other

## 2016-11-16 ENCOUNTER — Ambulatory Visit (INDEPENDENT_AMBULATORY_CARE_PROVIDER_SITE_OTHER): Payer: Medicare Other | Admitting: Internal Medicine

## 2016-11-16 ENCOUNTER — Encounter: Payer: Self-pay | Admitting: Internal Medicine

## 2016-11-16 VITALS — BP 160/92 | HR 70 | Temp 97.7°F | Resp 16 | Ht 62.0 in | Wt 190.5 lb

## 2016-11-16 DIAGNOSIS — R197 Diarrhea, unspecified: Secondary | ICD-10-CM

## 2016-11-16 DIAGNOSIS — I10 Essential (primary) hypertension: Secondary | ICD-10-CM | POA: Diagnosis not present

## 2016-11-16 DIAGNOSIS — R55 Syncope and collapse: Secondary | ICD-10-CM

## 2016-11-16 DIAGNOSIS — C5700 Malignant neoplasm of unspecified fallopian tube: Secondary | ICD-10-CM

## 2016-11-16 DIAGNOSIS — E538 Deficiency of other specified B group vitamins: Secondary | ICD-10-CM | POA: Diagnosis not present

## 2016-11-16 DIAGNOSIS — R42 Dizziness and giddiness: Secondary | ICD-10-CM | POA: Diagnosis not present

## 2016-11-16 LAB — CBC WITH DIFFERENTIAL/PLATELET
BASOS ABS: 0 10*3/uL (ref 0.0–0.1)
Basophils Relative: 0.7 % (ref 0.0–3.0)
Eosinophils Absolute: 0.1 10*3/uL (ref 0.0–0.7)
Eosinophils Relative: 2 % (ref 0.0–5.0)
HEMATOCRIT: 35.9 % — AB (ref 36.0–46.0)
HEMOGLOBIN: 12.1 g/dL (ref 12.0–15.0)
LYMPHS ABS: 1.7 10*3/uL (ref 0.7–4.0)
LYMPHS PCT: 25.7 % (ref 12.0–46.0)
MCHC: 33.8 g/dL (ref 30.0–36.0)
MCV: 84.3 fl (ref 78.0–100.0)
MONOS PCT: 6.3 % (ref 3.0–12.0)
Monocytes Absolute: 0.4 10*3/uL (ref 0.1–1.0)
Neutro Abs: 4.3 10*3/uL (ref 1.4–7.7)
Neutrophils Relative %: 65.3 % (ref 43.0–77.0)
Platelets: 219 10*3/uL (ref 150.0–400.0)
RBC: 4.26 Mil/uL (ref 3.87–5.11)
RDW: 14.1 % (ref 11.5–15.5)
WBC: 6.5 10*3/uL (ref 4.0–10.5)

## 2016-11-16 LAB — BASIC METABOLIC PANEL
BUN: 16 mg/dL (ref 6–23)
CO2: 27 mEq/L (ref 19–32)
Calcium: 9.7 mg/dL (ref 8.4–10.5)
Chloride: 105 mEq/L (ref 96–112)
Creatinine, Ser: 0.73 mg/dL (ref 0.40–1.20)
GFR: 83.55 mL/min (ref >60.00)
GLUCOSE: 103 mg/dL — AB (ref 70–99)
POTASSIUM: 3.6 meq/L (ref 3.5–5.1)
SODIUM: 142 meq/L (ref 135–145)

## 2016-11-16 LAB — URINALYSIS
BILIRUBIN URINE: NEGATIVE
Ketones, ur: NEGATIVE
Leukocytes, UA: NEGATIVE
Nitrite: NEGATIVE
PH: 6 (ref 5.0–8.0)
Specific Gravity, Urine: <=1.005 — AB (ref 1.000–1.030)
TOTAL PROTEIN, URINE-UPE24: NEGATIVE
URINE GLUCOSE: NEGATIVE
Urobilinogen, UA: 0.2 (ref 0.0–1.0)

## 2016-11-16 LAB — HEPATIC FUNCTION PANEL
ALK PHOS: 64 U/L (ref 39–117)
ALT: 20 U/L (ref 0–35)
AST: 20 U/L (ref 0–37)
Albumin: 4.3 g/dL (ref 3.5–5.2)
Bilirubin, Direct: 0.1 mg/dL (ref 0.0–0.3)
TOTAL PROTEIN: 7.6 g/dL (ref 6.0–8.3)
Total Bilirubin: 0.5 mg/dL (ref 0.2–1.2)

## 2016-11-16 LAB — CORTISOL: Cortisol, Plasma: 14.7 ug/dL

## 2016-11-16 LAB — VITAMIN B12: Vitamin B-12: 696 pg/mL (ref 211–911)

## 2016-11-16 LAB — TSH: TSH: 2.25 u[IU]/mL (ref 0.35–4.50)

## 2016-11-16 NOTE — Assessment & Plan Note (Signed)
Chronic Labs incl vitamins

## 2016-11-16 NOTE — Assessment & Plan Note (Signed)
On nasal B12

## 2016-11-16 NOTE — Progress Notes (Signed)
Pre-visit discussion using our clinic review tool. No additional management support is needed unless otherwise documented below in the visit note.  

## 2016-11-16 NOTE — Assessment & Plan Note (Signed)
Catapress

## 2016-11-16 NOTE — Progress Notes (Signed)
Subjective:  Patient ID: [name], female    DOB: July 20, 1946  Age: 71 y.o. MRN: [id number]  CC: Dizziness (lightheadedness)   HPI DAVON FOLTA presents for lightheadedness off and on - episodic starting 1 mo ago. It felt she could pass out x2. No CP. No LOC. The pt stopped driving...  Outpatient Medications Prior to Visit  Medication Sig Dispense Refill  . aspirin 81 MG EC tablet Take 81 mg by mouth daily.      . bimatoprost (LUMIGAN) 0.03 % ophthalmic solution Place 1 drop into both eyes at bedtime.      . Cholecalciferol (VITAMIN D3 SUPER STRENGTH) 2000 UNITS TABS Take 2 tablets (4,000 Units total) by mouth every morning. 100 each 3  . cloNIDine (CATAPRES) 0.1 MG tablet TAKE 1 TABLET BY MOUTH 2 TIMES DAILY 60 tablet 11  . colestipol (COLESTID) 1 g tablet Take 2 tablets (2 g total) by mouth 2 (two) times daily. (Patient taking differently: Take 1 g by mouth daily. ) 120 tablet 3  . denosumab (PROLIA) 60 MG/ML SOLN injection Inject 60 mg into the skin every 6 (six) months. Administer in upper arm, thigh, or abdomen    . diphenoxylate-atropine (LOMOTIL) 2.5-0.025 MG tablet Take 1 tablet by mouth 4 (four) times daily as needed for diarrhea or loose stools. (Patient taking differently: Take 1 tablet by mouth 2 (two) times daily. ) 60 tablet 3  . NASCOBAL 500 MCG/0.1ML SOLN Use one spray nasally once  a week 12 mL 1  . potassium chloride (K-DUR,KLOR-CON) 10 MEQ tablet Take 1 tablet (10 mEq total) by mouth 2 (two) times daily. (Patient taking differently: Take 10 mEq by mouth every 3 (three) days. ) 30 tablet 11  . RABEprazole (ACIPHEX) 20 MG tablet Take 1 tablet (20 mg total) by mouth daily. 90 tablet 3  . timolol (TIMOPTIC-XR) 0.5 % ophthalmic gel-forming Place 1 drop into both eyes 2 (two) times daily.     . clindamycin (CLEOCIN T) 1 % lotion Apply 1 application topically daily. Reported on 11/26/2015     No facility-administered medications prior to visit.     ROS Review of  Systems  Constitutional: Positive for fatigue. Negative for activity change, appetite change, chills and unexpected weight change.  HENT: Negative for congestion, mouth sores and sinus pressure.   Eyes: Negative for visual disturbance.  Respiratory: Negative for cough and chest tightness.   Gastrointestinal: Negative for abdominal pain and nausea.  Genitourinary: Negative for difficulty urinating, frequency and vaginal pain.  Musculoskeletal: Negative for back pain and gait problem.  Skin: Negative for pallor and rash.  Neurological: Positive for weakness and light-headedness. Negative for dizziness, tremors, numbness and headaches.  Psychiatric/Behavioral: Negative for confusion, sleep disturbance and suicidal ideas. The patient is nervous/anxious.     Objective:  BP (!) 160/92   Pulse 70   Temp 97.7 F (36.5 C) (Oral)   Resp 16   Ht 5\' 2"  (1.575 m)   Wt 190 lb 8 oz (86.4 kg)   SpO2 97%   BMI 34.84 kg/m   BP Readings from Last 3 Encounters:  11/16/16 (!) 160/92  10/24/16 (!) 147/67  09/29/16 128/80    Wt Readings from Last 3 Encounters:  11/16/16 190 lb 8 oz (86.4 kg)  10/24/16 187 lb 11.2 oz (85.1 kg)  09/29/16 188 lb (85.3 kg)    Physical Exam  Constitutional: She appears well-developed. No distress.  HENT:  Head: Normocephalic.  Right Ear: External ear normal.  Left Ear: External ear normal.  Nose: Nose normal.  Mouth/Throat: Oropharynx is clear and moist.  Eyes: Conjunctivae are normal. Pupils are equal, round, and reactive to light. Right eye exhibits no discharge. Left eye exhibits no discharge.  Neck: Normal range of motion. Neck supple. No JVD present. No tracheal deviation present. No thyromegaly present.  Cardiovascular: Normal rate, regular rhythm and normal heart sounds.   Pulmonary/Chest: No stridor. No respiratory distress. She has no wheezes.  Abdominal: Soft. Bowel sounds are normal. She exhibits no distension and no mass. There is no tenderness.  There is no rebound and no guarding.  Musculoskeletal: She exhibits no edema or tenderness.  Lymphadenopathy:    She has no cervical adenopathy.  Neurological: She displays normal reflexes. No cranial nerve deficit. She exhibits normal muscle tone. Coordination normal.  Skin: No rash noted. No erythema.  Psychiatric: She has a normal mood and affect. Her behavior is normal. Judgment and thought content normal.  Obese. Anxious  Procedure: EKG Indication: near syncope  Impression: NSR. Nonspecific ST. No new changes since 2016.   Lab Results  Component Value Date   WBC 6.3 03/21/2016   HGB 11.9 03/21/2016   HCT 35.6 03/21/2016   PLT 191 03/21/2016   GLUCOSE 114 (H) 09/26/2016   CHOL 170 09/26/2016   TRIG 148.0 09/26/2016   HDL 40.10 09/26/2016   LDLCALC 101 (H) 09/26/2016   ALT 17 09/26/2016   AST 18 09/26/2016   NA 142 09/26/2016   K 3.5 09/26/2016   CL 104 09/26/2016   CREATININE 0.78 09/26/2016   BUN 14 09/26/2016   CO2 29 09/26/2016   TSH 0.91 10/20/2015   HGBA1C 6.0 09/26/2016    Mm Screening Breast Tomo Bilateral  Result Date: 12/28/2015 CLINICAL DATA:  Screening. EXAM: 2D DIGITAL SCREENING BILATERAL MAMMOGRAM WITH CAD AND ADJUNCT TOMO COMPARISON:  Previous exam(s). ACR Breast Density Category b: There are scattered areas of fibroglandular density. FINDINGS: There are no findings suspicious for malignancy. Images were processed with CAD. IMPRESSION: No mammographic evidence of malignancy. A result letter of this screening mammogram will be mailed directly to the patient. RECOMMENDATION: Screening mammogram in one year. (Code:SM-B-01Y) BI-RADS CATEGORY  1: Negative. Electronically Signed   By: Lajean Manes M.D.   On: 12/29/2015 09:55    Assessment & Plan:   There are no diagnoses linked to this encounter. I have discontinued Ms. Mcginty clindamycin. I am also having her maintain her aspirin, bimatoprost, timolol, denosumab, cloNIDine, Cholecalciferol, NASCOBAL,  potassium chloride, colestipol, RABEprazole, and diphenoxylate-atropine.  No orders of the defined types were placed in this encounter.    Follow-up: No Follow-up on file.  Walker Kehr, MD

## 2016-11-16 NOTE — Assessment & Plan Note (Signed)
Labs

## 2016-11-16 NOTE — Assessment & Plan Note (Signed)
x2 episodes  Labs EKG Head CT Ortho VS Agree with no driving

## 2016-11-17 LAB — CA 125: CA 125: 10 U/mL (ref <35)

## 2016-11-20 LAB — VITAMIN B1: Vitamin B1 (Thiamine): 15 nmol/L (ref 8–30)

## 2016-11-23 ENCOUNTER — Ambulatory Visit (INDEPENDENT_AMBULATORY_CARE_PROVIDER_SITE_OTHER)
Admission: RE | Admit: 2016-11-23 | Discharge: 2016-11-23 | Disposition: A | Discharge status: Home / Self Care | Payer: Medicare Other | Source: Ambulatory Visit | Attending: Internal Medicine | Admitting: Internal Medicine

## 2016-11-23 DIAGNOSIS — R55 Syncope and collapse: Secondary | ICD-10-CM

## 2016-11-23 DIAGNOSIS — R42 Dizziness and giddiness: Secondary | ICD-10-CM | POA: Diagnosis not present

## 2016-11-23 DIAGNOSIS — R51 Headache: Secondary | ICD-10-CM | POA: Diagnosis not present

## 2016-11-25 ENCOUNTER — Encounter: Payer: Self-pay | Admitting: Internal Medicine

## 2016-11-28 ENCOUNTER — Encounter (HOSPITAL_COMMUNITY): Payer: Self-pay | Admitting: Emergency Medicine

## 2016-11-28 ENCOUNTER — Inpatient Hospital Stay (HOSPITAL_COMMUNITY)
Admission: EM | Admit: 2016-11-28 | Discharge: 2016-11-30 | DRG: 312 | Disposition: A | Discharge status: Home / Self Care | Payer: Medicare Other | Attending: Family Medicine | Admitting: Family Medicine

## 2016-11-28 DIAGNOSIS — Z86010 Personal history of colonic polyps: Secondary | ICD-10-CM

## 2016-11-28 DIAGNOSIS — K580 Irritable bowel syndrome with diarrhea: Secondary | ICD-10-CM | POA: Diagnosis present

## 2016-11-28 DIAGNOSIS — R404 Transient alteration of awareness: Secondary | ICD-10-CM | POA: Diagnosis not present

## 2016-11-28 DIAGNOSIS — E785 Hyperlipidemia, unspecified: Secondary | ICD-10-CM | POA: Diagnosis present

## 2016-11-28 DIAGNOSIS — R55 Syncope and collapse: Principal | ICD-10-CM | POA: Diagnosis present

## 2016-11-28 DIAGNOSIS — Z800 Family history of malignant neoplasm of digestive organs: Secondary | ICD-10-CM

## 2016-11-28 DIAGNOSIS — Z801 Family history of malignant neoplasm of trachea, bronchus and lung: Secondary | ICD-10-CM

## 2016-11-28 DIAGNOSIS — Z7982 Long term (current) use of aspirin: Secondary | ICD-10-CM

## 2016-11-28 DIAGNOSIS — I10 Essential (primary) hypertension: Secondary | ICD-10-CM | POA: Diagnosis present

## 2016-11-28 DIAGNOSIS — Z820 Family history of epilepsy and other diseases of the nervous system: Secondary | ICD-10-CM

## 2016-11-28 DIAGNOSIS — Z833 Family history of diabetes mellitus: Secondary | ICD-10-CM

## 2016-11-28 DIAGNOSIS — N951 Menopausal and female climacteric states: Secondary | ICD-10-CM | POA: Diagnosis present

## 2016-11-28 DIAGNOSIS — R42 Dizziness and giddiness: Secondary | ICD-10-CM | POA: Diagnosis not present

## 2016-11-28 DIAGNOSIS — Z803 Family history of malignant neoplasm of breast: Secondary | ICD-10-CM

## 2016-11-28 DIAGNOSIS — Z8249 Family history of ischemic heart disease and other diseases of the circulatory system: Secondary | ICD-10-CM

## 2016-11-28 DIAGNOSIS — Z79899 Other long term (current) drug therapy: Secondary | ICD-10-CM

## 2016-11-28 DIAGNOSIS — Z8544 Personal history of malignant neoplasm of other female genital organs: Secondary | ICD-10-CM | POA: Diagnosis not present

## 2016-11-28 DIAGNOSIS — K219 Gastro-esophageal reflux disease without esophagitis: Secondary | ICD-10-CM | POA: Diagnosis present

## 2016-11-28 DIAGNOSIS — Z806 Family history of leukemia: Secondary | ICD-10-CM

## 2016-11-28 DIAGNOSIS — M810 Age-related osteoporosis without current pathological fracture: Secondary | ICD-10-CM | POA: Diagnosis present

## 2016-11-28 DIAGNOSIS — H409 Unspecified glaucoma: Secondary | ICD-10-CM | POA: Diagnosis present

## 2016-11-28 DIAGNOSIS — K862 Cyst of pancreas: Secondary | ICD-10-CM | POA: Diagnosis not present

## 2016-11-28 DIAGNOSIS — Z888 Allergy status to other drugs, medicaments and biological substances status: Secondary | ICD-10-CM

## 2016-11-28 DIAGNOSIS — Z8379 Family history of other diseases of the digestive system: Secondary | ICD-10-CM

## 2016-11-28 DIAGNOSIS — Z825 Family history of asthma and other chronic lower respiratory diseases: Secondary | ICD-10-CM

## 2016-11-28 DIAGNOSIS — C5700 Malignant neoplasm of unspecified fallopian tube: Secondary | ICD-10-CM | POA: Diagnosis present

## 2016-11-28 DIAGNOSIS — Z8543 Personal history of malignant neoplasm of ovary: Secondary | ICD-10-CM

## 2016-11-28 LAB — URINALYSIS, ROUTINE W REFLEX MICROSCOPIC
BILIRUBIN URINE: NEGATIVE
GLUCOSE, UA: NEGATIVE mg/dL
KETONES UR: NEGATIVE mg/dL
LEUKOCYTES UA: NEGATIVE
Nitrite: NEGATIVE
PH: 5 (ref 5.0–8.0)
Protein, ur: NEGATIVE mg/dL
Specific Gravity, Urine: 1.006 (ref 1.005–1.030)
Squamous Epithelial / LPF: NONE SEEN

## 2016-11-28 LAB — CBC
HCT: 34.5 % — ABNORMAL LOW (ref 36.0–46.0)
Hemoglobin: 11.9 g/dL — ABNORMAL LOW (ref 12.0–15.0)
MCH: 29.4 pg (ref 26.0–34.0)
MCHC: 34.5 g/dL (ref 30.0–36.0)
MCV: 85.2 fL (ref 78.0–100.0)
PLATELETS: 208 10*3/uL (ref 150–400)
RBC: 4.05 MIL/uL (ref 3.87–5.11)
RDW: 13.7 % (ref 11.5–15.5)
WBC: 7.9 10*3/uL (ref 4.0–10.5)

## 2016-11-28 LAB — BASIC METABOLIC PANEL
Anion gap: 11 (ref 5–15)
BUN: 11 mg/dL (ref 6–20)
CALCIUM: 9.2 mg/dL (ref 8.9–10.3)
CO2: 25 mmol/L (ref 22–32)
CREATININE: 0.82 mg/dL (ref 0.44–1.00)
Chloride: 105 mmol/L (ref 101–111)
GFR calc non Af Amer: >60 mL/min (ref >60)
Glucose, Bld: 145 mg/dL — ABNORMAL HIGH (ref 65–99)
Potassium: 3.5 mmol/L (ref 3.5–5.1)
SODIUM: 141 mmol/L (ref 135–145)

## 2016-11-28 LAB — CBG MONITORING, ED: Glucose-Capillary: 158 mg/dL — ABNORMAL HIGH (ref 65–99)

## 2016-11-28 NOTE — ED Triage Notes (Signed)
Per EMS, pt from home c/o dizziness/near syncope.  Pt reports she was leaning over to get something and became very dizzy and felt that she was going to "pass out".  She had one episode of diarrhea but denies n/v.

## 2016-11-28 NOTE — ED Provider Notes (Signed)
By signing my name below, I, Avnee Patel, attest that this documentation has been prepared under the direction and in the presence of [place], DO  Electronically Signed: Delton Pra[place], ED Scribe. 11/28/16. 11:55 PM.  TIME SEEN: 11:33 PM  CHIEF COMPLAINT:  Chief Complaint  Patient presents with  . Dizziness  . Near Syncope    HPI:   [name] is a 71 y.o. female, with a PMhx of HTN on clonidine, HLD, pre diabetes who presents to the Emergency Department complaining of acute onset episode of lightheadedness which lasted for about 15 minutes x today. She states she was sitting on a sofa and leaning over when her symptoms began. Pt describes intermittent episodes of a sensation of feeling warm and "not right" but notes she is still functional during these episodes which have been occurring since 10/2016.  She felt like she was going to pass out. Tonight was one of the worst episodes. Pt also reports an elevated blood pressure which was around 200s/101 PTA, a pulse rate which was 134 PTA.  She felt SOB, palpitations, bilateral forearm pain. She had 2 bowel movements at home after this event. States she is still not feeling right but is no longer feeling dizzy like she will pass out. Pt denies chest pain, chest discomfort, nausea, vomiting, diaphoresis, vertigo, headache, weakness, numbness, focal weakness, vision changes or any other associated symptoms. She also denies a hx of strokes or heart failure. Pt reports she recently saw her PCP Dr. [name], had CT-scan of her head with no abnormalities, normal EKG and was referred to see Dr. Caryl Comes for consultation on 12/08/16.   PCP: Walker Kehr, MD  ROS: See HPI Constitutional: no fever  Eyes: no drainage  ENT: no runny nose   Cardiovascular:  no chest pain  Resp:  SOB  GI: no vomiting GU: no dysuria Integumentary: no rash  Allergy: no hives  Musculoskeletal: no leg swelling  Neurological: no slurred speech ROS otherwise  negative  PAST MEDICAL HISTORY/PAST SURGICAL HISTORY:  Past Medical History:  Diagnosis Date  . Adenomatous colon polyp 04/08/2011  . GERD (gastroesophageal reflux disease)   . Glaucoma (increased eye pressure)   . HTN (hypertension)   . IBS (irritable bowel syndrome)   . LBP (low back pain)   . Menopause   . Osteoporosis   . Ovarian cancer ([facility]) 09/2008   Dr Gwyneth Revels  . Pancreatic cyst   . Vitamin B12 deficiency   . Vitamin D deficiency     MEDICATIONS:  Prior to Admission medications   Medication Sig Start Date End Date Taking? Authorizing Provider  aspirin 81 MG EC tablet Take 81 mg by mouth daily.      Historical Provider, MD  bimatoprost (LUMIGAN) 0.03 % ophthalmic solution Place 1 drop into both eyes at bedtime.      Historical Provider, MD  Cholecalciferol (VITAMIN D3 SUPER STRENGTH) 2000 UNITS TABS Take 2 tablets (4,000 Units total) by mouth every morning. 05/15/15   Aleksei [name] V, MD  cloNIDine (CATAPRES) 0.1 MG tablet TAKE 1 TABLET BY MOUTH 2 TIMES DAILY 03/02/15   Aleksei [name] V, MD  colestipol (COLESTID) 1 g tablet Take 2 tablets (2 g total) by mouth 2 (two) times daily. Patient taking differently: Take 1 g by mouth daily.  08/25/16   Irene Shipper, MD  denosumab (PROLIA) 60 MG/ML SOLN injection Inject 60 mg into the skin every 6 (six) months. Administer in upper arm, thigh, or abdomen 09/02/13  Historical Provider, MD  diphenoxylate-atropine (LOMOTIL) 2.5-0.025 MG tablet Take 1 tablet by mouth 4 (four) times daily as needed for diarrhea or loose stools. Patient taking differently: Take 1 tablet by mouth 2 (two) times daily.  08/25/16   Irene Shipper, MD  NASCOBAL 500 MCG/0.1ML SOLN Use one spray nasally once  a week 02/15/16   Cassandria Anger, MD  potassium chloride (K-DUR,KLOR-CON) 10 MEQ tablet Take 1 tablet (10 mEq total) by mouth 2 (two) times daily. Patient taking differently: Take 10 mEq by mouth every 3 (three) days.  05/18/16   Aleksei [name] V, MD   RABEprazole (ACIPHEX) 20 MG tablet Take 1 tablet (20 mg total) by mouth daily. 08/25/16   Irene Shipper, MD  timolol (TIMOPTIC-XR) 0.5 % ophthalmic gel-forming Place 1 drop into both eyes 2 (two) times daily.  03/03/11   Historical Provider, MD    ALLERGIES:  Allergies  Allergen Reactions  . Actonel [Risedronate Sodium]     Upset stomach  . Boniva [Ibandronate Sodium]     cramp  . Calcium Channel Blockers     Upset stomach  . Compazine     Daughter reacts to compazine/pt does not want to take  . Lyrica [Pregabalin]     Dizzy     SOCIAL HISTORY:  Social History  Substance Use Topics  . Smoking status: Never Smoker  . Smokeless tobacco: Never Used  . Alcohol use No    FAMILY HISTORY: Family History  Problem Relation Age of Onset  . Alzheimer's disease Mother 39  . Other Mother 42    TAH for excessive bleeding  . Lung cancer Father     smoker; metastasis to stomach and other areas  . Heart attack Maternal Uncle   . Other Paternal Aunt     stomach issues  . Heart Problems Paternal Uncle   . Other Maternal Grandmother     stomach issues; +hysterectomy  . Heart attack Maternal Grandfather   . Infertility Daughter   . Stomach cancer Cousin     dx. mid-60s  . Other Cousin     stomach issues  . Leukemia Cousin 18  . Stomach cancer Other   . Cancer Cousin     unknown type  . Other Cousin     stomach issues  . Heart Problems Maternal Uncle   . Diabetes Paternal Aunt   . Heart Problems Paternal Uncle   . Emphysema Paternal Uncle     work exposure  . Breast cancer Cousin     dx. late 60s-early 70s    EXAM: BP (!) 161/92 (BP Location: Right Arm)   Pulse 86   Temp 98.2 F (36.8 C) (Oral)   Ht 5\' 3"  (1.6 m)   Wt 189 lb (85.7 kg)   SpO2 100%   BMI 33.48 kg/m  CONSTITUTIONAL: Alert and oriented and responds appropriately to questions. Elderly and anxious; well-nourished HEAD: Normocephalic EYES: Conjunctivae clear, pupils appear equal, EOMI ENT: normal nose;  moist mucous membranes NECK: Supple, no meningismus, no nuchal rigidity, no LAD  CARD: RRR; S1 and S2 appreciated; no murmurs, no clicks, no rubs, no gallops RESP: Normal chest excursion without splinting or tachypnea; breath sounds clear and equal bilaterally; no wheezes, no rhonchi, no rales, no hypoxia or respiratory distress, speaking full sentences ABD/GI: Normal bowel sounds; non-distended; soft, non-tender, no rebound, no guarding, no peritoneal signs, no hepatosplenomegaly BACK:  The back appears normal and is non-tender to palpation, there is no CVA tenderness EXT:  Normal ROM in all joints; non-tender to palpation; no edema; normal capillary refill; no cyanosis, no calf tenderness or swelling    SKIN: Normal color for age and race; warm; no rash NEURO: Moves all extremities equally; Strength 5/5 in all four extremities.  Normal sensation diffusely.  CN 2-12 grossly intact.  No dysmetria to finger to nose testing bilaterally.  Normal speech.  Normal heel to shin testing bilaterally. PSYCH: The patient's mood and manner are appropriate. Grooming and personal hygiene are appropriate.  MEDICAL DECISION MAKING: Patient here with episode of near-syncope. She has hypertension, hyperlipidemia. Has had these intermittent episodes February 14 but states tonight was the worst. She is extremely hypertensive at home as well as tachycardic. She did have some shortness of breath, bilateral arm pain and palpitations. No focal neurologic deficits. Reports she is feeling better. She is mildly hypertensive here. She does report she is on clonidine and is supposed to take it twice a day but has a live-in taking it once a day for several months. Last dose of clonidine was yesterday night before bed. No other new medications. It is unclear of her heart rate in the 130s at home was regular or irregular. She denies history of arrhythmia. So far her workup has been unremarkable. Potassium 3.5. Normal hemoglobin. Will  check troponin. Differential diagnosis includes ACS, arrhythmia, electrolyte abnormality. Doubt stroke. Neurologically intact here. Discussed with patient and family that this also could be related to not taking clonidine as prescribed. She states when she takes it in the daytime she becomes very lightheaded and sleepy. Family would prefer admission which I do not feel is unreasonable for further monitoring on the cardiac monitor especially given heart rate in the 130s and possible echocardiogram here in the hospital.  ED PROGRESS: Patient's potassium is 3.5, magnesium 1.6. Will give replacement optimize these electrolytes. Troponin is negative. Still asymptomatic. No cardiac events seen on the cardiac monitor. We'll discuss with hospitalist for admission.  1:33 AM Discussed patient's case with hospitalist, Dr. Hal Hope.  I have recommended admission and patient (and family if present) agree with this plan. Admitting physician will place admission orders.   I reviewed all nursing notes, vitals, pertinent previous records, EKGs, lab and urine results, imaging (as available).     EKG Interpretation  Date/Time:  Monday November 28 2016 22:22:52 EDT Ventricular Rate:  84 PR Interval:    QRS Duration: 99 QT Interval:  375 QTC Calculation: 444 R Axis:   45 Text Interpretation:  Sinus rhythm Low voltage, precordial leads RSR' in V1 or V2, right VCD or RVH No significant change since last tracing Confirmed by Valentine Kuechle,  DO, Alaynah Schutter ([id number]) on 11/28/2016 11:08:45 PM       I personally performed the services described in this documentation, which was scribed in my presence. The recorded information has been reviewed and is accurate.     [place], DO 11/29/16 [phone]

## 2016-11-28 NOTE — ED Notes (Signed)
Pt ambulated to the bathroom w/ standby assistance, no problems

## 2016-11-29 ENCOUNTER — Other Ambulatory Visit: Payer: Self-pay | Admitting: Physician Assistant

## 2016-11-29 ENCOUNTER — Encounter (HOSPITAL_COMMUNITY): Payer: Self-pay | Admitting: Internal Medicine

## 2016-11-29 ENCOUNTER — Observation Stay (HOSPITAL_COMMUNITY): Payer: Medicare Other

## 2016-11-29 DIAGNOSIS — C5700 Malignant neoplasm of unspecified fallopian tube: Secondary | ICD-10-CM | POA: Diagnosis not present

## 2016-11-29 DIAGNOSIS — H409 Unspecified glaucoma: Secondary | ICD-10-CM | POA: Diagnosis present

## 2016-11-29 DIAGNOSIS — K862 Cyst of pancreas: Secondary | ICD-10-CM | POA: Diagnosis present

## 2016-11-29 DIAGNOSIS — Z86010 Personal history of colonic polyps: Secondary | ICD-10-CM | POA: Diagnosis not present

## 2016-11-29 DIAGNOSIS — Z820 Family history of epilepsy and other diseases of the nervous system: Secondary | ICD-10-CM | POA: Diagnosis not present

## 2016-11-29 DIAGNOSIS — I10 Essential (primary) hypertension: Secondary | ICD-10-CM | POA: Diagnosis not present

## 2016-11-29 DIAGNOSIS — Z888 Allergy status to other drugs, medicaments and biological substances status: Secondary | ICD-10-CM | POA: Diagnosis not present

## 2016-11-29 DIAGNOSIS — Z803 Family history of malignant neoplasm of breast: Secondary | ICD-10-CM | POA: Diagnosis not present

## 2016-11-29 DIAGNOSIS — N951 Menopausal and female climacteric states: Secondary | ICD-10-CM | POA: Diagnosis present

## 2016-11-29 DIAGNOSIS — Z8379 Family history of other diseases of the digestive system: Secondary | ICD-10-CM | POA: Diagnosis not present

## 2016-11-29 DIAGNOSIS — R55 Syncope and collapse: Secondary | ICD-10-CM | POA: Diagnosis not present

## 2016-11-29 DIAGNOSIS — M810 Age-related osteoporosis without current pathological fracture: Secondary | ICD-10-CM | POA: Diagnosis present

## 2016-11-29 DIAGNOSIS — Z8544 Personal history of malignant neoplasm of other female genital organs: Secondary | ICD-10-CM | POA: Diagnosis not present

## 2016-11-29 DIAGNOSIS — E785 Hyperlipidemia, unspecified: Secondary | ICD-10-CM | POA: Diagnosis present

## 2016-11-29 DIAGNOSIS — K580 Irritable bowel syndrome with diarrhea: Secondary | ICD-10-CM | POA: Diagnosis present

## 2016-11-29 DIAGNOSIS — Z833 Family history of diabetes mellitus: Secondary | ICD-10-CM | POA: Diagnosis not present

## 2016-11-29 DIAGNOSIS — Z800 Family history of malignant neoplasm of digestive organs: Secondary | ICD-10-CM | POA: Diagnosis not present

## 2016-11-29 DIAGNOSIS — Z825 Family history of asthma and other chronic lower respiratory diseases: Secondary | ICD-10-CM | POA: Diagnosis not present

## 2016-11-29 DIAGNOSIS — K219 Gastro-esophageal reflux disease without esophagitis: Secondary | ICD-10-CM | POA: Diagnosis present

## 2016-11-29 DIAGNOSIS — Z806 Family history of leukemia: Secondary | ICD-10-CM | POA: Diagnosis not present

## 2016-11-29 DIAGNOSIS — Z8249 Family history of ischemic heart disease and other diseases of the circulatory system: Secondary | ICD-10-CM | POA: Diagnosis not present

## 2016-11-29 DIAGNOSIS — R030 Elevated blood-pressure reading, without diagnosis of hypertension: Secondary | ICD-10-CM | POA: Diagnosis not present

## 2016-11-29 DIAGNOSIS — Z7982 Long term (current) use of aspirin: Secondary | ICD-10-CM | POA: Diagnosis not present

## 2016-11-29 DIAGNOSIS — Z79899 Other long term (current) drug therapy: Secondary | ICD-10-CM | POA: Diagnosis not present

## 2016-11-29 DIAGNOSIS — Z801 Family history of malignant neoplasm of trachea, bronchus and lung: Secondary | ICD-10-CM | POA: Diagnosis not present

## 2016-11-29 DIAGNOSIS — Z8543 Personal history of malignant neoplasm of ovary: Secondary | ICD-10-CM | POA: Diagnosis not present

## 2016-11-29 LAB — CBC WITH DIFFERENTIAL/PLATELET
Basophils Absolute: 0 10*3/uL (ref 0.0–0.1)
Basophils Relative: 0 %
EOS PCT: 1 %
Eosinophils Absolute: 0.1 10*3/uL (ref 0.0–0.7)
HEMATOCRIT: 31.8 % — AB (ref 36.0–46.0)
Hemoglobin: 10.5 g/dL — ABNORMAL LOW (ref 12.0–15.0)
Lymphocytes Relative: 26 %
Lymphs Abs: 1.7 10*3/uL (ref 0.7–4.0)
MCH: 28 pg (ref 26.0–34.0)
MCHC: 33 g/dL (ref 30.0–36.0)
MCV: 84.8 fL (ref 78.0–100.0)
MONO ABS: 0.4 10*3/uL (ref 0.1–1.0)
Monocytes Relative: 7 %
NEUTROS PCT: 66 %
Neutro Abs: 4.5 10*3/uL (ref 1.7–7.7)
PLATELETS: 199 10*3/uL (ref 150–400)
RBC: 3.75 MIL/uL — ABNORMAL LOW (ref 3.87–5.11)
RDW: 13.8 % (ref 11.5–15.5)
WBC: 6.8 10*3/uL (ref 4.0–10.5)

## 2016-11-29 LAB — COMPREHENSIVE METABOLIC PANEL
ALT: 19 U/L (ref 14–54)
AST: 19 U/L (ref 15–41)
Albumin: 3.7 g/dL (ref 3.5–5.0)
Alkaline Phosphatase: 58 U/L (ref 38–126)
Anion gap: 9 (ref 5–15)
BILIRUBIN TOTAL: 0.7 mg/dL (ref 0.3–1.2)
BUN: 10 mg/dL (ref 6–20)
CHLORIDE: 107 mmol/L (ref 101–111)
CO2: 25 mmol/L (ref 22–32)
Calcium: 8.8 mg/dL — ABNORMAL LOW (ref 8.9–10.3)
Creatinine, Ser: 0.69 mg/dL (ref 0.44–1.00)
GFR calc Af Amer: >60 mL/min (ref >60)
Glucose, Bld: 115 mg/dL — ABNORMAL HIGH (ref 65–99)
POTASSIUM: 3.7 mmol/L (ref 3.5–5.1)
Sodium: 141 mmol/L (ref 135–145)
Total Protein: 6.7 g/dL (ref 6.5–8.1)

## 2016-11-29 LAB — MAGNESIUM: MAGNESIUM: 1.6 mg/dL — AB (ref 1.7–2.4)

## 2016-11-29 LAB — TROPONIN I: Troponin I: <0.03 ng/mL (ref <0.03)

## 2016-11-29 LAB — I-STAT TROPONIN, ED: TROPONIN I, POC: 0 ng/mL (ref 0.00–0.08)

## 2016-11-29 LAB — TSH: TSH: 1.53 u[IU]/mL (ref 0.350–4.500)

## 2016-11-29 MED ORDER — ACETAMINOPHEN 650 MG RE SUPP: 650.0000 mg | Freq: Four times a day (QID) | RECTAL | Status: DC | PRN

## 2016-11-29 MED ORDER — MAGNESIUM SULFATE IN D5W 1-5 GM/100ML-% IV SOLN
1.0000 g | Freq: Once | INTRAVENOUS | Status: AC
  Administered 2016-11-29: 1 g via INTRAVENOUS
  Filled 2016-11-29: qty 100

## 2016-11-29 MED ORDER — POTASSIUM CHLORIDE CRYS ER 20 MEQ PO TBCR
40.0000 meq | EXTENDED_RELEASE_TABLET | Freq: Once | ORAL | Status: AC
  Administered 2016-11-29: 40 meq via ORAL
  Filled 2016-11-29: qty 2

## 2016-11-29 MED ORDER — SODIUM CHLORIDE 0.9 % IV SOLN
INTRAVENOUS | Status: DC
  Administered 2016-11-29: 07:00:00 via INTRAVENOUS
  Administered 2016-11-29: 1000 mL via INTRAVENOUS
  Administered 2016-11-30: 08:00:00 via INTRAVENOUS

## 2016-11-29 MED ORDER — ENOXAPARIN SODIUM 40 MG/0.4ML ~~LOC~~ SOLN
40.0000 mg | SUBCUTANEOUS | Status: DC
  Administered 2016-11-29 – 2016-11-30 (×2): 40 mg via SUBCUTANEOUS
  Filled 2016-11-29 (×2): qty 0.4

## 2016-11-29 MED ORDER — ASPIRIN EC 81 MG PO TBEC
81.0000 mg | DELAYED_RELEASE_TABLET | Freq: Every day | ORAL | Status: DC
  Administered 2016-11-29 – 2016-11-30 (×2): 81 mg via ORAL
  Filled 2016-11-29 (×2): qty 1

## 2016-11-29 MED ORDER — LATANOPROST 0.005 % OP SOLN
1.0000 [drp] | Freq: Every day | OPHTHALMIC | Status: DC
  Administered 2016-11-29: 1 [drp] via OPHTHALMIC
  Filled 2016-11-29: qty 2.5

## 2016-11-29 MED ORDER — DIPHENOXYLATE-ATROPINE 2.5-0.025 MG PO TABS
1.0000 | ORAL_TABLET | Freq: Two times a day (BID) | ORAL | Status: DC
  Administered 2016-11-29 – 2016-11-30 (×3): 1 via ORAL
  Filled 2016-11-29 (×3): qty 1

## 2016-11-29 MED ORDER — POTASSIUM CHLORIDE CRYS ER 10 MEQ PO TBCR
10.0000 meq | EXTENDED_RELEASE_TABLET | ORAL | Status: DC
  Administered 2016-11-29: 10 meq via ORAL
  Filled 2016-11-29: qty 1

## 2016-11-29 MED ORDER — CLONIDINE HCL 0.1 MG PO TABS
0.1000 mg | ORAL_TABLET | Freq: Once | ORAL | Status: AC
  Administered 2016-11-29: 0.1 mg via ORAL
  Filled 2016-11-29: qty 1

## 2016-11-29 MED ORDER — CLONIDINE HCL 0.1 MG PO TABS
0.1000 mg | ORAL_TABLET | Freq: Every day | ORAL | Status: DC
  Administered 2016-11-29: 0.1 mg via ORAL
  Filled 2016-11-29: qty 1

## 2016-11-29 MED ORDER — POTASSIUM CHLORIDE CRYS ER 10 MEQ PO TBCR: 10.0000 meq | EXTENDED_RELEASE_TABLET | ORAL | Status: DC

## 2016-11-29 MED ORDER — TIMOLOL MALEATE 0.5 % OP SOLG
1.0000 [drp] | Freq: Every day | OPHTHALMIC | Status: DC
Start: 2016-11-29 — End: 2016-11-30
  Administered 2016-11-29 – 2016-11-30 (×2): 1 [drp] via OPHTHALMIC
  Filled 2016-11-29: qty 5

## 2016-11-29 MED ORDER — PANTOPRAZOLE SODIUM 40 MG PO TBEC
40.0000 mg | DELAYED_RELEASE_TABLET | Freq: Every day | ORAL | Status: DC
  Administered 2016-11-29 – 2016-11-30 (×2): 40 mg via ORAL
  Filled 2016-11-29 (×2): qty 1

## 2016-11-29 MED ORDER — COLESTIPOL HCL 1 G PO TABS
1.0000 g | ORAL_TABLET | Freq: Every day | ORAL | Status: DC
  Administered 2016-11-29 – 2016-11-30 (×2): 1 g via ORAL
  Filled 2016-11-29 (×2): qty 1

## 2016-11-29 MED ORDER — VITAMIN D 1000 UNITS PO TABS
4000.0000 [IU] | ORAL_TABLET | Freq: Every morning | ORAL | Status: DC
  Administered 2016-11-29 – 2016-11-30 (×2): 4000 [IU] via ORAL
  Filled 2016-11-29 (×2): qty 4

## 2016-11-29 MED ORDER — ACETAMINOPHEN 325 MG PO TABS: 650.0000 mg | ORAL_TABLET | Freq: Four times a day (QID) | ORAL | Status: DC | PRN

## 2016-11-29 NOTE — H&P (Addendum)
History and Physical    [name] UGQ:[id number] DOB: 1946/06/15 DOA: 11/28/2016  PCP: Walker Kehr, MD  Patient coming from: Home.  Chief Complaint: Dizziness.  HPI: [name] is a 71 y.o. female with history of fallopian tube cancer in remission, B-12 deficiency and hypertension presents to the ER after patient had experienced dizziness last evening. Patient states this was the third episode in the last month and a half. First episode was on February 14 while patient was attending a party. Episode lasted for a few minutes and resolved. Patient again had an episode after 1 week. Patient had followed up with her PCP and had CT of the head done last week which was unremarkable. Patient was referred to Dr. Caryl Comes cardiologist. Which was scheduled for next week. Last evening patient while at home was working when patient suddenly felt flushed and palpitations with dizziness. Patient's husband checked her blood pressure it was in the 882C systolic. Heart rate was in the 130s. Patient was brought to the ER.   ED Course: EKG shows normal sinus rhythm. Initial blood pressure was in the high 190s and 200s. Improved after patient given patient's night dose of clonidine. Patient denies any chest pain or shortness of breath. Chest x-ray unremarkable. Patient admitted for further observation.  Review of Systems: As per HPI, rest all negative.   Past Medical History:  Diagnosis Date  . Adenomatous colon polyp 04/08/2011  . GERD (gastroesophageal reflux disease)   . Glaucoma (increased eye pressure)   . HTN (hypertension)   . IBS (irritable bowel syndrome)   . LBP (low back pain)   . Menopause   . Osteoporosis   . Ovarian cancer ([place]) 09/2008   Dr Gwyneth Revels  . Pancreatic cyst   . Vitamin B12 deficiency   . Vitamin D deficiency     Past Surgical History:  Procedure Laterality Date  . [place]  2001  . Ovarian Cancer Debulking  09/2008     reports that she has never  smoked. She has never used smokeless tobacco. She reports that she does not drink alcohol or use drugs.  Allergies  Allergen Reactions  . Actonel [Risedronate Sodium]     Upset stomach  . Boniva [Ibandronate Sodium]     cramp  . Calcium Channel Blockers     Upset stomach  . Compazine     Daughter reacts to compazine/pt does not want to take  . Lyrica [Pregabalin]     Dizzy     Family History  Problem Relation Age of Onset  . Alzheimer's disease Mother 74  . Other Mother 60    TAH for excessive bleeding  . Lung cancer Father     smoker; metastasis to stomach and other areas  . Heart attack Maternal Uncle   . Other Paternal Aunt     stomach issues  . Heart Problems Paternal Uncle   . Other Maternal Grandmother     stomach issues; +hysterectomy  . Heart attack Maternal Grandfather   . Infertility Daughter   . Stomach cancer Cousin     dx. mid-60s  . Other Cousin     stomach issues  . Leukemia Cousin 18  . Stomach cancer Other   . Cancer Cousin     unknown type  . Other Cousin     stomach issues  . Heart Problems Maternal Uncle   . Diabetes Paternal Aunt   . Heart Problems Paternal Uncle   . Emphysema Paternal Uncle  work exposure  . Breast cancer Cousin     dx. late 60s-early 70s    Prior to Admission medications   Medication Sig Start Date End Date Taking? Authorizing Provider  aspirin 81 MG EC tablet Take 81 mg by mouth daily.     Yes Historical Provider, MD  bimatoprost (LUMIGAN) 0.03 % ophthalmic solution Place 1 drop into both eyes at bedtime.     Yes Historical Provider, MD  Cholecalciferol (VITAMIN D3 SUPER STRENGTH) 2000 UNITS TABS Take 2 tablets (4,000 Units total) by mouth every morning. 05/15/15  Yes Aleksei [name] V, MD  cloNIDine (CATAPRES) 0.1 MG tablet Take 0.1 mg by mouth at bedtime.   Yes Historical Provider, MD  colestipol (COLESTID) 1 g tablet Take 2 tablets (2 g total) by mouth 2 (two) times daily. Patient taking differently: Take 1 g by  mouth daily with lunch.  08/25/16  Yes Irene Shipper, MD  denosumab (PROLIA) 60 MG/ML SOLN injection Inject 60 mg into the skin every 6 (six) months. Administer in upper arm, thigh, or abdomen 09/02/13  Yes Historical Provider, MD  diphenoxylate-atropine (LOMOTIL) 2.5-0.025 MG tablet Take 1 tablet by mouth 4 (four) times daily as needed for diarrhea or loose stools. Patient taking differently: Take 1 tablet by mouth 2 (two) times daily.  08/25/16  Yes Irene Shipper, MD  NASCOBAL 500 MCG/0.1ML SOLN Use one spray nasally once  a week 02/15/16  Yes Aleksei [name] V, MD  potassium chloride (K-DUR,KLOR-CON) 10 MEQ tablet Take 1 tablet (10 mEq total) by mouth 2 (two) times daily. Patient taking differently: Take 10 mEq by mouth every 3 (three) days.  05/18/16  Yes Aleksei [name] V, MD  RABEprazole (ACIPHEX) 20 MG tablet Take 1 tablet (20 mg total) by mouth daily. 08/25/16  Yes Irene Shipper, MD  timolol (TIMOPTIC-XR) 0.5 % ophthalmic gel-forming Place 1 drop into both eyes daily.  03/03/11  Yes Historical Provider, MD  cloNIDine (CATAPRES) 0.1 MG tablet TAKE 1 TABLET BY MOUTH 2 TIMES DAILY Patient not taking: Reported on 11/29/2016 03/02/15   Cassandria Anger, MD    Physical Exam: Vitals:   11/29/16 0015 11/29/16 0100 11/29/16 0130 11/29/16 0245  BP: (!) 168/79 (!) 159/63 (!) 159/63 (!) 151/60  Pulse: 79 83 81 75  Resp: 15 15 12 15   Temp:      TempSrc:      SpO2: 98% 99% 97% 97%  Weight:      Height:          Constitutional: Moderately built and nourished. Vitals:   11/29/16 0015 11/29/16 0100 11/29/16 0130 11/29/16 0245  BP: (!) 168/79 (!) 159/63 (!) 159/63 (!) 151/60  Pulse: 79 83 81 75  Resp: 15 15 12 15   Temp:      TempSrc:      SpO2: 98% 99% 97% 97%  Weight:      Height:       Eyes: Anicteric no pallor. ENMT: No discharge from the ears eyes nose and mouth. Neck: No JVD appreciated no mass felt. Respiratory: No rhonchi or crepitations. Cardiovascular: S1-S2 heard. No  murmurs appreciated. Abdomen: Soft nontender bowel sounds present. Musculoskeletal: No edema. No joint effusion. Skin: No rash. Skin appears warm. Neurologic: Alert awake oriented to time place and person. Moves all extremities. Psychiatric: Appears normal. Normal affect.   Labs on Admission: I have personally reviewed following labs and imaging studies  CBC:  Recent Labs Lab 11/28/16 2247  WBC 7.9  HGB 11.9*  HCT 34.5*  MCV 85.2  PLT 283   Basic Metabolic Panel:  Recent Labs Lab 11/28/16 2247 11/28/16 2350  NA 141  --   K 3.5  --   CL 105  --   CO2 25  --   GLUCOSE 145*  --   BUN 11  --   CREATININE 0.82  --   CALCIUM 9.2  --   MG  --  1.6*   GFR: Estimated Creatinine Clearance: 66.2 mL/min (by C-G formula based on SCr of 0.82 mg/dL). Liver Function Tests: No results for input(s): AST, ALT, ALKPHOS, BILITOT, PROT, ALBUMIN in the last 168 hours. No results for input(s): LIPASE, AMYLASE in the last 168 hours. No results for input(s): AMMONIA in the last 168 hours. Coagulation Profile: No results for input(s): INR, PROTIME in the last 168 hours. Cardiac Enzymes: No results for input(s): CKTOTAL, CKMB, CKMBINDEX, TROPONINI in the last 168 hours. BNP (last 3 results) No results for input(s): PROBNP in the last 8760 hours. HbA1C: No results for input(s): HGBA1C in the last 72 hours. CBG:  Recent Labs Lab 11/28/16 2226  GLUCAP 158*   Lipid Profile: No results for input(s): CHOL, HDL, LDLCALC, TRIG, CHOLHDL, LDLDIRECT in the last 72 hours. Thyroid Function Tests:  Recent Labs  11/28/16 2350  TSH 1.530   Anemia Panel: No results for input(s): VITAMINB12, FOLATE, FERRITIN, TIBC, IRON, RETICCTPCT in the last 72 hours. Urine analysis:    Component Value Date/Time   COLORURINE STRAW (A) 11/28/2016 2321   APPEARANCEUR CLEAR 11/28/2016 2321   LABSPEC 1.006 11/28/2016 2321   LABSPEC 1.025 02/03/2014 1104   PHURINE 5.0 11/28/2016 2321   GLUCOSEU NEGATIVE  11/28/2016 2321   GLUCOSEU NEGATIVE 11/16/2016 1440   GLUCOSEU Negative 02/03/2014 1104   HGBUR SMALL (A) 11/28/2016 2321   BILIRUBINUR NEGATIVE 11/28/2016 2321   BILIRUBINUR negative 12/22/2014 0850   BILIRUBINUR Negative 02/03/2014 1104   KETONESUR NEGATIVE 11/28/2016 2321   PROTEINUR NEGATIVE 11/28/2016 2321   UROBILINOGEN 0.2 11/16/2016 1440   UROBILINOGEN 0.2 02/03/2014 1104   NITRITE NEGATIVE 11/28/2016 2321   LEUKOCYTESUR NEGATIVE 11/28/2016 2321   LEUKOCYTESUR Negative 02/03/2014 1104   Sepsis Labs: @LABRCNTIP (procalcitonin:4,lacticidven:4) )No results found for this or any previous visit (from the past 240 hour(s)).   Radiological Exams on Admission: Dg Chest 2 View  Result Date: 11/29/2016 CLINICAL DATA:  Acute onset of high blood pressure. Initial encounter. EXAM: CHEST  2 VIEW COMPARISON:  Chest radiograph performed 11/12/2014 FINDINGS: The lungs are well-aerated and clear. There is no evidence of focal opacification, pleural effusion or pneumothorax. The heart is normal in size; the mediastinal contour is within normal limits. No acute osseous abnormalities are seen. IMPRESSION: No acute cardiopulmonary process seen. Electronically Signed   By: Garald Balding M.D.   On: 11/29/2016 03:20    EKG: Independently reviewed. Normal sinus rhythm. QTC within acceptable limits.  Assessment/Plan Principal Problem:   Near syncope Active Problems:   Essential hypertension   Fallopian tube cancer, carcinoma ([place])    1. Near syncope - symptoms were associated with flushing. TSH was normal. We'll check plasma metanephrine. Check 2-D echo continue to monitor in telemetry. Consult Dr. Caryl Comes in a.m. 2. Hypertension uncontrolled - improved after patient received her night dose of clonidine. I have placed patient on when necessary IV hydralazine. 3. History of fallopian cancer tumor in remission. 4. Chronic diarrhea - being followed by Dr. Henrene Pastor gastroenterologist. Continue home  medications.   DVT prophylaxis: Lovenox. Code Status: Full code.  Family Communication: Patient's husband.  Disposition Plan: Home.  Consults called: None.  Admission status: Observation.    Rise Patience MD [facility] Pager [phone].  If 7PM-7AM, please contact night-coverage [email or web address] Password [email or web address]  11/29/2016, 4:02 AM

## 2016-11-29 NOTE — Progress Notes (Signed)
30 day event monitor

## 2016-11-29 NOTE — Consult Note (Signed)
Cardiology Consult    Patient ID: EEVIE LAPP MRN: [id number], DOB/AGE: 10-May-1946   Admit date: 11/28/2016 Date of Consult: 11/29/2016  Primary Physician: Walker Kehr, MD Primary Cardiologist: new - Dr. Sallyanne Kuster Requesting Provider: Dr. Ree Kida  Reason for Consult: near syncope  Patient Profile    Ms. Mcbeth is a 71 yo female with a PMH significant for ovarian/fallopian tube cancer, GERD, HTN, and pancreatic cyst. She reported to the [facility] after three episodes of near syncope over the past month. Cardiology was consulted for near syncope.  Past Medical History   Past Medical History:  Diagnosis Date  . Adenomatous colon polyp 04/08/2011  . GERD (gastroesophageal reflux disease)   . Glaucoma (increased eye pressure)   . HTN (hypertension)   . IBS (irritable bowel syndrome)   . LBP (low back pain)   . Menopause   . Osteoporosis   . Ovarian cancer ([place]) 09/2008   Dr Gwyneth Revels  . Pancreatic cyst   . Vitamin B12 deficiency   . Vitamin D deficiency     Past Surgical History:  Procedure Laterality Date  . [place]  2001  . Ovarian Cancer Debulking  09/2008     Allergies  Allergies  Allergen Reactions  . Actonel [Risedronate Sodium]     Upset stomach  . Boniva [Ibandronate Sodium]     cramp  . Calcium Channel Blockers     Upset stomach  . Compazine     Daughter reacts to compazine/pt does not want to take  . Lyrica [Pregabalin]     Dizzy     History of Present Illness    Ms. Aguon visited her PCP on 11/16/16 with 2 episodes of near syncope over the previous month. At that time, she denied chest pain, shortness of breath, or nausea. She does report palpitations with each episode. The first episode was on Feb 13 during a meal; the second episode on 10/23/16 she was driving. She predominately feels a warm sensation and lightheadedness.  Per PCP, CT head (11/23/16) was ordered and was without abnormalities; EKG was WNL.  Yesterday, she reported to the  [facility] after a third episode of near syncope. He was sitting on the couch and leaned over to pick up something and became dizzy and felt like she was going to faint. She felt hot but did not lose consciousness. She reported shortness of breath and palpitations.  At the time of this episode, her husband and a Industrial/product designer (who is a Marine scientist) took her BP and states sBP was in the 200s and her heart rate was in the 130s. Per the neighbor, her pulse "was regular." These episodes generally last only a few minutes, but the last episode was more intense, prompting her to report to the ED. She denies vision changes, neurological changes, vomiting, and sweating.  She has taken clonidine for several years to help control hot flashes. It was prescribed BID, but pt states she has only been taking it daily before bedtime. She says she gets dizzy and lightheaded if she takes it in the morning. Question possible rebound HTN as a cause of her near syncopal events.  Given her high heart rate during the last episode, also question arrhythmia.   Inpatient Medications    . aspirin EC  81 mg Oral Daily  . cholecalciferol  4,000 Units Oral q morning - 10a  . cloNIDine  0.1 mg Oral QHS  . colestipol  1 g Oral Q lunch  . diphenoxylate-atropine  1 tablet  Oral BID  . enoxaparin (LOVENOX) injection  40 mg Subcutaneous Q24H  . latanoprost  1 drop Both Eyes QHS  . pantoprazole  40 mg Oral Daily  . potassium chloride  10 mEq Oral Q3 days  . timolol  1 drop Both Eyes Daily     Outpatient Medications    Prior to Admission medications   Medication Sig Start Date End Date Taking? Authorizing Provider  aspirin 81 MG EC tablet Take 81 mg by mouth daily.     Yes Historical Provider, MD  bimatoprost (LUMIGAN) 0.03 % ophthalmic solution Place 1 drop into both eyes at bedtime.     Yes Historical Provider, MD  Cholecalciferol (VITAMIN D3 SUPER STRENGTH) 2000 UNITS TABS Take 2 tablets (4,000 Units total) by mouth every morning. 05/15/15  Yes  Aleksei [name] V, MD  cloNIDine (CATAPRES) 0.1 MG tablet Take 0.1 mg by mouth at bedtime.   Yes Historical Provider, MD  colestipol (COLESTID) 1 g tablet Take 2 tablets (2 g total) by mouth 2 (two) times daily. Patient taking differently: Take 1 g by mouth daily with lunch.  08/25/16  Yes Irene Shipper, MD  denosumab (PROLIA) 60 MG/ML SOLN injection Inject 60 mg into the skin every 6 (six) months. Administer in upper arm, thigh, or abdomen 09/02/13  Yes Historical Provider, MD  diphenoxylate-atropine (LOMOTIL) 2.5-0.025 MG tablet Take 1 tablet by mouth 4 (four) times daily as needed for diarrhea or loose stools. Patient taking differently: Take 1 tablet by mouth 2 (two) times daily.  08/25/16  Yes Irene Shipper, MD  NASCOBAL 500 MCG/0.1ML SOLN Use one spray nasally once  a week 02/15/16  Yes Aleksei [name] V, MD  potassium chloride (K-DUR,KLOR-CON) 10 MEQ tablet Take 1 tablet (10 mEq total) by mouth 2 (two) times daily. Patient taking differently: Take 10 mEq by mouth every 3 (three) days.  05/18/16  Yes Aleksei [name] V, MD  RABEprazole (ACIPHEX) 20 MG tablet Take 1 tablet (20 mg total) by mouth daily. 08/25/16  Yes Irene Shipper, MD  timolol (TIMOPTIC-XR) 0.5 % ophthalmic gel-forming Place 1 drop into both eyes daily.  03/03/11  Yes Historical Provider, MD  cloNIDine (CATAPRES) 0.1 MG tablet TAKE 1 TABLET BY MOUTH 2 TIMES DAILY Patient not taking: Reported on 11/29/2016 03/02/15   Cassandria Anger, MD     Family History    Family History  Problem Relation Age of Onset  . Alzheimer's disease Mother 37  . Other Mother 62    TAH for excessive bleeding  . Lung cancer Father     smoker; metastasis to stomach and other areas  . Heart attack Maternal Uncle   . Other Paternal Aunt     stomach issues  . Heart Problems Paternal Uncle   . Other Maternal Grandmother     stomach issues; +hysterectomy  . Heart attack Maternal Grandfather   . Infertility Daughter   . Stomach cancer Cousin       dx. mid-60s  . Other Cousin     stomach issues  . Leukemia Cousin 18  . Stomach cancer Other   . Cancer Cousin     unknown type  . Other Cousin     stomach issues  . Heart Problems Maternal Uncle   . Diabetes Paternal Aunt   . Heart Problems Paternal Uncle   . Emphysema Paternal Uncle     work exposure  . Breast cancer Cousin     dx. late 60s-early 74s  Social History    Social History   Social History  . Marital status: Married    Spouse name: Dominica Severin  . Number of children: 2  . Years of education: N/A   Occupational History  . Homemaker Homemaker   Social History Main Topics  . Smoking status: Never Smoker  . Smokeless tobacco: Never Used  . Alcohol use No  . Drug use: No  . Sexual activity: Not on file   Other Topics Concern  . Not on file   Social History Narrative  . No narrative on file     Review of Systems    General:  No chills, fever, night sweats or weight changes.  Cardiovascular:  No chest pain, dyspnea on exertion, edema, orthopnea, paroxysmal nocturnal dyspnea. Positive for palpitations during events Dermatological: No rash, lesions/masses Respiratory: No cough, Positive for dyspnea during events Urologic: No hematuria, dysuria Abdominal:   No nausea, vomiting, diarrhea, bright red blood per rectum, melena, or hematemesis Neurologic:  No visual changes, changes in mental status. All other systems reviewed and are otherwise negative except as noted above.  Physical Exam    Blood pressure (!) 123/48, pulse 85, temperature 98.3 F (36.8 C), temperature source Oral, resp. rate 18, height 5\' 3"  (1.6 m), weight 189 lb 2.5 oz (85.8 kg), SpO2 99 %.  General: Pleasant, NAD Psych: Normal affect. Neuro: Alert and oriented X 3. Moves all extremities spontaneously. HEENT: Normal  Neck: Supple without bruits or JVD. Lungs:  Resp regular and unlabored, CTA. Heart: RRR no s3, s4, systolic murmur 3/6. Abdomen: Soft, non-tender, non-distended, BS + x  4.  Extremities: No clubbing, cyanosis. Trace edema. DP/PT/Radials 2+ and equal bilaterally.  Labs    Troponin [facility] of Care Test)  Recent Labs  11/29/16 0014  TROPIPOC 0.00    Recent Labs  11/29/16 0515  TROPONINI <0.03   Lab Results  Component Value Date   WBC 6.8 11/29/2016   HGB 10.5 (L) 11/29/2016   HCT 31.8 (L) 11/29/2016   MCV 84.8 11/29/2016   PLT 199 11/29/2016     Recent Labs Lab 11/29/16 0515  NA 141  K 3.7  CL 107  CO2 25  BUN 10  CREATININE 0.69  CALCIUM 8.8*  PROT 6.7  BILITOT 0.7  ALKPHOS 58  ALT 19  AST 19  GLUCOSE 115*   Lab Results  Component Value Date   CHOL 170 09/26/2016   HDL 40.10 09/26/2016   LDLCALC 101 (H) 09/26/2016   TRIG 148.0 09/26/2016   No results found for: [facility]   Radiology Studies    Dg Chest 2 View  Result Date: 11/29/2016 CLINICAL DATA:  Acute onset of high blood pressure. Initial encounter. EXAM: CHEST  2 VIEW COMPARISON:  Chest radiograph performed 11/12/2014 FINDINGS: The lungs are well-aerated and clear. There is no evidence of focal opacification, pleural effusion or pneumothorax. The heart is normal in size; the mediastinal contour is within normal limits. No acute osseous abnormalities are seen. IMPRESSION: No acute cardiopulmonary process seen. Electronically Signed   By: Garald Balding M.D.   On: 11/29/2016 03:20   Ct Head Wo Contrast  Result Date: 11/23/2016 CLINICAL DATA:  Recent dizziness and near syncope. Headaches. No history of recent injury. History of fallopian tube cancer 8 years ago. EXAM: CT HEAD WITHOUT CONTRAST TECHNIQUE: Contiguous axial images were obtained from the base of the skull through the vertex without intravenous contrast. COMPARISON:  None. FINDINGS: Brain: Ventricles are normal in size and configuration. There  is mild generalized parenchymal atrophy, with bilateral frontal lobe predominance, with commensurate dilatation of the sulci. There is no mass, hemorrhage, edema or other  evidence of acute parenchymal abnormality. No extra-axial hemorrhage. Vascular: No hyperdense vessel or unexpected calcification. Skull: Normal. Negative for fracture or focal lesion. Sinuses/Orbits: Visualized upper paranasal sinuses are clear. Visualized upper periorbital and retro-orbital soft tissues are unremarkable. Other: None. IMPRESSION: 1. No acute findings.  No intracranial mass, hemorrhage or edema. 2. Mild atrophy, with frontal lobe predominance. Electronically Signed   By: Franki Cabot M.D.   On: 11/23/2016 14:21    ECG & Cardiac Imaging    EKG 11/28/16: sinus rhythm  Echocardiogram 11/29/16: pending  Echocardiogram 08/23/04: SUMMARY - Left ventricular ejection fraction was estimated to be 65 %.    There were no left ventricular regional wall motion    abnormalities. - There was mild aortic valvular regurgitation. - Left atrial size was at the upper limits of normal. - No obvious vegetations are seen. IMPRESSIONS - No obvious vegetations are seen.  Assessment & Plan    1. Near syncope - electrolytes WNL; she is not anemic; TSH WNL - Mag pending; metanephrines pending - EKG without arrhythmias - troponin x 2 negative - possible causes of these near-syncopal events include rebound HTN from clonidine, systolic murmur, and arrhythmias - echocardiogram is pending to check heart anatomy and function; echo in 2005 with aortic regurgitation and left atrium at the upper limits of normal - unfortunately, she is on a floor without telemetry, no strips are available - will recommend a 30-day event monitor for arrhythmias vs loop recorder (appt made) - would also recommend carotid duplex, although bruits not appreciated on exam - recommend no driving until near-syncopal episodes are resolved   2. HTN - check orthostatics - clonidine may not be a good medication for this patient given the potential for rebound HTN. Her near syncope may be related to taking  clonidine only daily; however, pt states she has taken it for years for hot flashes - recommend stopping clonidine and assess blood pressure needs   3. Chronic diarrhea - per primary team, on home medications - cannot exclude dehydration as a cause of her near-syncopal episodes; she states she has not been drinking enough water    Signed, Ledora Bottcher, PA-C 11/29/2016, 2:47 PM  I have seen and examined the patient along with Ledora Bottcher, PA.  I have reviewed the chart, notes and new data.  I agree with PA's note.  Key new complaints: episodes of palpitations and near syncope could represent a true arrhythmia, but might also represent appropriate sinus tachycardia as a compensatory mechanism for hypotension (chronic diarrhea?). Some episodes appear related to changes in position, although not consistently.  It is not clear to me whether she truly has HTN or just situational hypertension. Once daily clonidine (for hot flashes) is muddying the picture. Key examination changes: normal CV exam Key new findings / data: low risk ECG and enzymes, echo pending.  PLAN: 30 day event monitor. Review echo. If no surprising findings on echo, further workup can be done as an outpatient. Avoid driving until we clarify the cause of her events. Stop clonidine. Record BP at home when feeling well, calm and relaxed. Bring detailed log to appt. Drink plenty of fluids.   Sanda Klein, MD, [place] [phone] 11/29/2016, 5:20 PM

## 2016-11-29 NOTE — Care Management Obs Status (Signed)
Lupus NOTIFICATION   Patient Details  Name: [name] MRN: [id number] Date of Birth: 04/02/1946   Medicare Observation Status Notification Given:  Yes    Burgess Sheriff, Rory Percy, RN 11/29/2016, 11:45 AM

## 2016-11-29 NOTE — ED Notes (Signed)
Patient transported to X-ray 

## 2016-11-29 NOTE — Plan of Care (Signed)
Problem: Education: Goal: Knowledge of [facility] Education information/materials will improve Outcome: Progressing POC reviewed with pt.   

## 2016-11-29 NOTE — Progress Notes (Signed)
Patient admitted earlier today by Dr. Gean Birchwood. See H&P for full details.  71 year old female history of cancer, in remission, B-12 deficiency, hypertension, prediabetes, who presented to emergency department with complaints of dizziness and almost passing out. Patient had a similar episode back in February. Patient has never lost consciousness. She has had a CT scan of her head last week which was unremarkable. Patient's primary doctor wanted to refer her to Dr. Caryl Comes, cardiologist. Today evening, patient was noted to be at home and she started to feel very flushed having dizziness and feeling as if she was going to pass out again. Patient's blood pressure was elevated, and heart rates were in the 130s. Patient was then brought to the emergency department. Of note, patient has had many issues with IBS, diarrheal type. Wonder if her near syncopal episode could be caused by GI losses. She has never felt dizzy while having a bowel movement. She does follow up with Dr.Perry. Patient does seem to be anxious and mildly depressed, wonder if this is having an affect on her dizziness.  Cardiology consulted and appreciated. Pending recommendations.   Time spent: 25 minutes  Ricci Dirocco D.O. [facility] Pager [phone]  If 7PM-7AM, please contact night-coverage [email or web address] Password [email or web address] 11/29/2016, 1:03 PM

## 2016-11-29 NOTE — ED Notes (Signed)
Admitting provider bedside 

## 2016-11-30 ENCOUNTER — Inpatient Hospital Stay (HOSPITAL_COMMUNITY): Payer: Medicare Other

## 2016-11-30 DIAGNOSIS — R55 Syncope and collapse: Secondary | ICD-10-CM

## 2016-11-30 DIAGNOSIS — I10 Essential (primary) hypertension: Secondary | ICD-10-CM

## 2016-11-30 DIAGNOSIS — C5700 Malignant neoplasm of unspecified fallopian tube: Secondary | ICD-10-CM

## 2016-11-30 LAB — ECHOCARDIOGRAM COMPLETE
AVLVOTPG: 8 mmHg
E decel time: 158 msec
EERAT: 18.06
FS: 26 % — AB (ref 28–44)
Height: 63 in
IVS/LV PW RATIO, ED: 1
LA ID, A-P, ES: 40 mm
LA diam end sys: 40 mm
LA vol A4C: 65 ml
LA vol index: 30.2 mL/m2
LADIAMINDEX: 2.12 cm/m2
LAVOL: 57 mL
LV E/e'average: 18.06
LVEEMED: 18.06
LVELAT: 5.87 cm/s
LVOT VTI: 31.5 cm
LVOT peak vel: 142 cm/s
MV Dec: 158
MVPG: 4 mmHg
MVPKAVEL: 139 m/s
MVPKEVEL: 106 m/s
P 1/2 time: 368 ms
PW: 10 mm — AB (ref 0.6–1.1)
RV LATERAL S' VELOCITY: 14.7 cm/s
Reg peak vel: 276 cm/s
TAPSE: 19.2 mm
TDI e' lateral: 5.87
TDI e' medial: 8.16
TRMAXVEL: 276 cm/s
Weight: 3026.47 oz

## 2016-11-30 LAB — CBC
HCT: 32.2 % — ABNORMAL LOW (ref 36.0–46.0)
Hemoglobin: 10.7 g/dL — ABNORMAL LOW (ref 12.0–15.0)
MCH: 28.8 pg (ref 26.0–34.0)
MCHC: 33.2 g/dL (ref 30.0–36.0)
MCV: 86.6 fL (ref 78.0–100.0)
PLATELETS: 178 10*3/uL (ref 150–400)
RBC: 3.72 MIL/uL — AB (ref 3.87–5.11)
RDW: 14.1 % (ref 11.5–15.5)
WBC: 5.4 10*3/uL (ref 4.0–10.5)

## 2016-11-30 LAB — BASIC METABOLIC PANEL
Anion gap: 6 (ref 5–15)
BUN: 8 mg/dL (ref 6–20)
CALCIUM: 8.2 mg/dL — AB (ref 8.9–10.3)
CO2: 26 mmol/L (ref 22–32)
CREATININE: 0.66 mg/dL (ref 0.44–1.00)
Chloride: 110 mmol/L (ref 101–111)
GFR calc Af Amer: >60 mL/min (ref >60)
GLUCOSE: 94 mg/dL (ref 65–99)
Potassium: 3.3 mmol/L — ABNORMAL LOW (ref 3.5–5.1)
SODIUM: 142 mmol/L (ref 135–145)

## 2016-11-30 LAB — METANEPHRINES, PLASMA
Metanephrine, Free: 19 pg/mL (ref 0–62)
Normetanephrine, Free: 41 pg/mL (ref 0–145)

## 2016-11-30 LAB — MAGNESIUM: Magnesium: 1.8 mg/dL (ref 1.7–2.4)

## 2016-11-30 MED ORDER — POTASSIUM CHLORIDE CRYS ER 20 MEQ PO TBCR
20.0000 meq | EXTENDED_RELEASE_TABLET | Freq: Once | ORAL | Status: AC
  Administered 2016-11-30: 20 meq via ORAL
  Filled 2016-11-30: qty 1

## 2016-11-30 NOTE — Progress Notes (Signed)
  Echocardiogram 2D Echocardiogram has been performed.  Darlina Sicilian M 11/30/2016, 2:45 PM

## 2016-11-30 NOTE — Progress Notes (Signed)
Discharge instructions and medications discussed with patient and husband.  All questions answered.

## 2016-11-30 NOTE — Progress Notes (Signed)
Progress Note  Patient Name: [name] Date of Encounter: 11/30/2016  Primary Cardiologist: Dr. Sallyanne Kuster  Subjective   Patient is feeling well; denies chest pain, SOB, and palpitations. No further episodes of near syncope.  Inpatient Medications    Scheduled Meds: . aspirin EC  81 mg Oral Daily  . cholecalciferol  4,000 Units Oral q morning - 10a  . cloNIDine  0.1 mg Oral QHS  . colestipol  1 g Oral Q lunch  . diphenoxylate-atropine  1 tablet Oral BID  . enoxaparin (LOVENOX) injection  40 mg Subcutaneous Q24H  . latanoprost  1 drop Both Eyes QHS  . pantoprazole  40 mg Oral Daily  . potassium chloride  10 mEq Oral Q3 days  . timolol  1 drop Both Eyes Daily   Continuous Infusions: . sodium chloride 75 mL/hr at 11/30/16 0829   PRN Meds: acetaminophen **OR** acetaminophen   Vital Signs    Vitals:   11/29/16 1015 11/29/16 2143 11/30/16 0418 11/30/16 0950  BP: (!) 123/48 (!) 144/86 (!) 140/54 (!) 114/53  Pulse: 85 72 63 74  Resp: 18 19 19 20   Temp: 98.3 F (36.8 C) 99 F (37.2 C) 97.8 F (36.6 C) 97.7 F (36.5 C)  TempSrc: Oral Oral Oral Oral  SpO2: 99% 99% 98% 98%  Weight:      Height:        Intake/Output Summary (Last 24 hours) at 11/30/16 1015 Last data filed at 11/30/16 0920  Gross per 24 hour  Intake             2640 ml  Output                0 ml  Net             2640 ml   Filed Weights   11/28/16 2224 11/29/16 0407  Weight: 189 lb (85.7 kg) 189 lb 2.5 oz (85.8 kg)     Physical Exam   General: Well developed, well nourished, female appearing in no acute distress. Head: Normocephalic, atraumatic.  Neck: Supple without bruits, JVD. Lungs:  Resp regular and unlabored, CTA. Heart: RRR, S1, S2, no S3, S4, or murmur; no rub. Abdomen: Soft, non-tender, non-distended with normoactive bowel sounds. No hepatomegaly. No rebound/guarding. No obvious abdominal masses. Extremities: No clubbing, cyanosis, trace edema, likely third-spacing with IVF.   Distal pedal pulses are 2+ bilaterally. Neuro: Alert and oriented X 3. Moves all extremities spontaneously. Psych: Normal affect.  Labs    Chemistry Recent Labs Lab 11/28/16 2247 11/29/16 0515 11/30/16 0547  NA 141 141 142  K 3.5 3.7 3.3*  CL 105 107 110  CO2 25 25 26   GLUCOSE 145* 115* 94  BUN 11 10 8   CREATININE 0.82 0.69 0.66  CALCIUM 9.2 8.8* 8.2*  PROT  --  6.7  --   ALBUMIN  --  3.7  --   AST  --  19  --   ALT  --  19  --   ALKPHOS  --  58  --   BILITOT  --  0.7  --   GFRNONAA >60 >60 >60  GFRAA >60 >60 >60  ANIONGAP 11 9 6      Hematology Recent Labs Lab 11/28/16 2247 11/29/16 0515 11/30/16 0547  WBC 7.9 6.8 5.4  RBC 4.05 3.75* 3.72*  HGB 11.9* 10.5* 10.7*  HCT 34.5* 31.8* 32.2*  MCV 85.2 84.8 86.6  MCH 29.4 28.0 28.8  MCHC 34.5 33.0 33.2  RDW  13.7 13.8 14.1  PLT 208 199 178    Cardiac Enzymes Recent Labs Lab 11/29/16 0515  TROPONINI <0.03    Recent Labs Lab 11/29/16 0014  TROPIPOC 0.00     BNPNo results for input(s): BNP, PROBNP in the last 168 hours.   DDimer No results for input(s): DDIMER in the last 168 hours.   Radiology    Dg Chest 2 View  Result Date: 11/29/2016 CLINICAL DATA:  Acute onset of high blood pressure. Initial encounter. EXAM: CHEST  2 VIEW COMPARISON:  Chest radiograph performed 11/12/2014 FINDINGS: The lungs are well-aerated and clear. There is no evidence of focal opacification, pleural effusion or pneumothorax. The heart is normal in size; the mediastinal contour is within normal limits. No acute osseous abnormalities are seen. IMPRESSION: No acute cardiopulmonary process seen. Electronically Signed   By: Garald Balding M.D.   On: 11/29/2016 03:20     Telemetry    NA - Personally Reviewed  ECG    No new tracings- Personally Reviewed   Cardiac Studies   Echo pending  Patient Profile     71 y.o. female with a PMH significant for ovarian/fallopian tube cancer, GERD, HTN, and pancreatic cyst. She reported  to the [facility] after three episodes of near syncope over the past month. Cardiology was consulted for near syncope.  Assessment & Plan    1. Near syncope - electrolytes WNL; she is not anemic; TSH WNL - Mag pending; metanephrines pending - EKG without arrhythmias - troponin x 2 negative - normal telemetry - will recommend a 30-day event monitor for arrhythmias vs loop recorder (appt made) - would also recommend carotid duplex, although bruits not appreciated on exam - recommend no driving until near-syncopal episodes are resolved - echocardiogram is pending to check heart anatomy and function; echo in 2005 with aortic regurgitation and left atrium at the upper limits of normal - if echo normal, OK to discharge from a cardiology standpoint (on the schedule for today) - recommend D/C clonidine   2. HTN - check orthostatics - clonidine may not be a good medication for this patient given the potential for rebound HTN. Her near syncope may be related to taking clonidine only daily; however, pt states she has taken it for years for hot flashes - recommend stopping clonidine and assess blood pressure needs - instructed to log BPs   3. Chronic diarrhea - per primary team, on home medications - cannot exclude dehydration as a cause of her near-syncopal episodes; she states she has not been drinking enough water    Signed, Ledora Bottcher , PA-C 10:15 AM 11/30/2016 Pager: [phone]  I have seen and examined the patient along with Ledora Bottcher, PA.  I have reviewed the chart, notes and new data.  I agree with PA's note.  Key new complaints: no new events Key examination changes: normal hemodynamics, sinus rhyuthm on monitor Key new findings / data: waiting for echo  PLAN: No new recommendations. Unless there is a major echo abnormality, OK to DC with outpatient 30 day event monitor after the echo.  Sanda Klein, MD, [place] [phone] 11/30/2016, 12:45  PM

## 2016-11-30 NOTE — Progress Notes (Signed)
Echo stable. From our standpoint ok for discharge.

## 2016-11-30 NOTE — Discharge Instructions (Signed)
You're going to discontinue taking the clonidine according to the cardiologist's recommendations. Your instructions are to take one half tablet every evening for the next 3 nights and then discontinue clonidine completely.  No driving or operating a motor vehicle until cleared by cardiology that it is safe to do so.  Go and pick up event monitor at [facility] on 4/5 as scheduled.      Cardiac Event Monitoring A cardiac event monitor is a small recording device that is used to detect abnormal heart rhythms (arrhythmias). The monitor is used to record your heart rhythm when you have symptoms, such as:  Fast heartbeats (palpitations), such as heart racing or fluttering.  Dizziness.  Fainting or light-headedness.  Unexplained weakness. Some monitors are wired to electrodes placed on your chest. Electrodes are flat, sticky disks that attach to your skin. Other monitors may be hand-held or worn on the wrist. The monitor can be worn for up to 30 days. If the monitor is attached to your chest, a technician will prepare your chest for the electrode placement and show you how to work the monitor. Take time to practice using the monitor before you leave the office. Make sure you understand how to send the information from the monitor to your health care provider. In some cases, you may need to use a landline telephone instead of a cell phone. What are the risks? Generally, this device is safe to use, but it possible that the skin under the electrodes will become irritated. How to use your cardiac event monitor  Wear your monitor at all times, except when you are in water:  Do not let the monitor get wet.  Take the monitor off when you bathe. Do not swim or use a hot tub with it on.  Keep your skin clean. Do not put body lotion or moisturizer on your chest.  Change the electrodes as told by your health care provider or any time they stop sticking to your skin. You may need to use medical tape to  keep them on.  Try to put the electrodes in slightly different places on your chest to help prevent skin irritation. They must remain in the area under your left breast and in the upper right section of your chest.  Make sure the monitor is safely clipped to your clothing or in a location close to your body that your health care provider recommends.  Press the button to record as soon as you feel heart-related symptoms, such as:  Dizziness.  Weakness.  Light-headedness.  Palpitations.  Thumping or pounding in your chest.  Shortness of breath.  Unexplained weakness.  Keep a diary of your activities, such as walking, doing chores, and taking medicine. It is very important to note what you were doing when you pushed the button to record your symptoms. This will help your health care provider determine what might be contributing to your symptoms.  Send the recorded information as recommended by your health care provider. It may take some time for your health care provider to process the results.  Change the batteries as told by your health care provider.  Keep electronic devices away from your monitor. This includes:  Tablets.  MP3 players.  Cell phones.  While wearing your monitor you should avoid:  Electric blankets.  Armed forces operational officer.  Electric toothbrushes.  Microwave ovens.  Magnets.  Metal detectors. Get help right away if:  You have chest pain.  You have extreme difficulty breathing or shortness of  breath.  You develop a very fast heartbeat that persists.  You develop dizziness that does not go away.  You faint or constantly feel like you are about to faint. Summary  A cardiac event monitor is a small recording device that is used to help detect abnormal heart rhythms (arrhythmias).  The monitor is used to record your heart rhythm when you have heart-related symptoms.  Make sure you understand how to send the information from the monitor to your  health care provider.  It is important to press the button on the monitor when you have any heart-related symptoms.  Keep a diary of your activities, such as walking, doing chores, and taking medicine. It is very important to note what you were doing when you pushed the button to record your symptoms. This will help your health care provider learn what might be causing your symptoms. This information is not intended to replace advice given to you by your health care provider. Make sure you discuss any questions you have with your health care provider. Document Released: 05/31/2008 Document Revised: 08/06/2016 Document Reviewed: 08/06/2016 Elsevier Interactive Patient Education  2017 [employer]   Near-Syncope Near-syncope is when you suddenly get weak or dizzy, or you feel like you might pass out (faint). During an episode of near-syncope, you may:  Feel dizzy or light-headed.  Feel sick to your stomach (nauseous).  See all white or all black.  Have cold, clammy skin. If you passed out, get help right away.Call your local emergency services (911 in the [place] Do not drive yourself to the hospital. Follow these instructions at home: Pay attention to any changes in your symptoms. Take these actions to help with your condition:  Have someone stay with you until you feel stable.  Do not drive, use machinery, or play sports until your doctor says it is okay.  Keep all follow-up visits as told by your doctor. This is important.  If you start to feel like you might pass out, lie down right away and raise (elevate) your feet above the level of your heart. Breathe deeply and steadily. Wait until all of the symptoms are gone.  Drink enough fluid to keep your pee (urine) clear or pale yellow.  If you are taking blood pressure or heart medicine, get up slowly and spend many minutes getting ready to sit and then stand. This can help with dizziness.  Take over-the-counter and prescription  medicines only as told by your doctor. Get help right away if:  You have a very bad headache.  You have unusual pain in your chest, tummy, or back.  You are bleeding from your mouth or rectum.  You have black or tarry poop (stool).  You have a very fast or uneven heartbeat (palpitations).  You pass out one time or more than once.  You have jerky movements that you cannot control (seizure).  You are confused.  You have trouble walking.  You are very weak.  You have vision problems. These symptoms may be an emergency. Do not wait to see if the symptoms will go away. Get medical help right away. Call your local emergency services (911 in the [place] Do not drive yourself to the hospital. This information is not intended to replace advice given to you by your health care provider. Make sure you discuss any questions you have with your health care provider. Document Released: 02/08/2008 Document Revised: 01/28/2016 Document Reviewed: 05/06/2015 Elsevier Interactive Patient Education  2017 [employer].

## 2016-11-30 NOTE — Discharge Summary (Signed)
Physician Discharge Summary  MICKELLE ROLOFF XBJ:[id number] DOB: 05-11-1946 DOA: 11/28/2016  PCP: Sonda Primes, MD  Admit date: 11/28/2016 Discharge date: 11/30/2016  Admitted From: Home  Disposition:  Home   Recommendations for Outpatient Follow-up:  1. Follow up with PCP in 1 weeks 2. Please recheck blood pressure and home BP log on follow up  3. Follow up with cardiology as scheduled  Discharge Condition: STABLE CODE STATUS: FULL  Diet recommendation: Heart Healthy  Instructions: You're going to discontinue taking the clonidine according to the cardiologist's recommendations. Your instructions are to take one half tablet every evening for the next 3 nights and then discontinue clonidine completely.  No driving or operating a motor vehicle until cleared by cardiology that it is safe to do so.   Brief/Interim Summary: HPI: ANOOP AMEND is a 71 y.o. female with history of fallopian tube cancer in remission, B-12 deficiency and hypertension presents to the ER after patient had experienced dizziness last evening. Patient states this was the third episode in the last month and a half. First episode was on February 14 while patient was attending a party. Episode lasted for a few minutes and resolved. Patient again had an episode after 1 week. Patient had followed up with her PCP and had CT of the head done last week which was unremarkable. Patient was referred to Dr. Graciela Husbands cardiologist. Which was scheduled for next week. Last evening patient while at home was working when patient suddenly felt flushed and palpitations with dizziness. Patient's husband checked her blood pressure it was in the 200s systolic. Heart rate was in the 130s. Patient was brought to the ER.   ED Course: EKG shows normal sinus rhythm. Initial blood pressure was in the high 190s and 200s. Improved after patient given patient's night dose of clonidine. Patient denies any chest pain or shortness of breath. Chest x-ray  unremarkable. Patient admitted for further observation.  1. Near syncope - symptoms were associated with flushing. TSH was normal. We'll check plasma metanephrine. Check 2-D echo continue to monitor in telemetry. Consulted cardiology.  2. Hypertension - cardiology recommended discontinuing clonidine which will be weaned over the next 3 days, further outpatient adjustment of blood pressure medications as needed. Close follow-up with PCP and cardiology recommended..  3. History of fallopian cancer tumor in remission. 4. Chronic diarrhea - being followed by Dr. Marina Goodell gastroenterologist. Continue home medications.  Echocardiogram: 11/30/16  Study Conclusions  - Left ventricle: The cavity size was normal. Wall thickness was   increased in a pattern of mild LVH. Systolic function was normal.   The estimated ejection fraction was in the range of 55% to 60%.   Wall motion was normal; there were no regional wall motion   abnormalities. Doppler parameters are consistent with abnormal   left ventricular relaxation (grade 1 diastolic dysfunction). The   E/e&' ratio is >15, suggesting elevated LV filling pressure. - Aortic valve: Trileaflet. Sclerosis without stenosis. There was mild regurgitation. - Mitral valve: Mildly thickened leaflets . There was trivial  regurgitation. - Left atrium: The atrium was normal in size. - Tricuspid valve: There was mild regurgitation. - Pulmonary arteries: PA peak pressure: 33 mm Hg (S). - Inferior vena cava: The vessel was normal in size. The  respirophasic diameter changes were in the normal range (>= 50%), consistent with normal central venous pressure.  Impressions:  - LVEF 55-60%, mild LVH, normal wall motion, diastolic dysfunction, elevated LV filling pressure, aortic valve sclerosis with mild   AI,  trivial MR, normal LA size, mild TR, RVSP 33 mmHg, normal IVC.   DVT prophylaxis: Lovenox. Code Status: Full code.  Family Communication: Patient's  husband.  Disposition Plan: Home.  Consults called: Cardiology.  Discharge Diagnoses:  Principal Problem:   Near syncope Active Problems:   Essential hypertension   Fallopian tube cancer, carcinoma [facility])  Discharge Instructions  Discharge Instructions    Discharge instructions    Complete by:  As directed    You're going to discontinue taking the clonidine according to the cardiologist's recommendations. Your instructions are to take one half tablet every evening for the next 3 nights and then discontinue clonidine completely.  No driving or operating a motor vehicle until cleared by cardiology that it is safe to do so.   Increase activity slowly    Complete by:  As directed      Allergies as of 11/30/2016      Reactions   Actonel [risedronate Sodium]    Upset stomach   Boniva [ibandronate Sodium]    cramp   Calcium Channel Blockers    Upset stomach   Compazine    Daughter reacts to compazine/pt does not want to take   Lyrica [pregabalin]    Dizzy      Medication List    STOP taking these medications   cloNIDine 0.1 MG tablet Commonly known as:  CATAPRES     TAKE these medications   aspirin 81 MG EC tablet Take 81 mg by mouth daily.   bimatoprost 0.03 % ophthalmic solution Commonly known as:  LUMIGAN Place 1 drop into both eyes at bedtime.   Cholecalciferol 2000 units Tabs Commonly known as:  VITAMIN D3 SUPER STRENGTH Take 2 tablets (4,000 Units total) by mouth every morning.   colestipol 1 g tablet Commonly known as:  COLESTID Take 2 tablets (2 g total) by mouth 2 (two) times daily. What changed:  how much to take  when to take this   denosumab 60 MG/ML Soln injection Commonly known as:  PROLIA Inject 60 mg into the skin every 6 (six) months. Administer in upper arm, thigh, or abdomen   diphenoxylate-atropine 2.5-0.025 MG tablet Commonly known as:  LOMOTIL Take 1 tablet by mouth 4 (four) times daily as needed for diarrhea or loose stools. What  changed:  when to take this   NASCOBAL 500 MCG/0.1ML Soln Generic drug:  Cyanocobalamin Use one spray nasally once  a week   potassium chloride 10 MEQ tablet Commonly known as:  K-DUR,KLOR-CON Take 1 tablet (10 mEq total) by mouth 2 (two) times daily. What changed:  when to take this   RABEprazole 20 MG tablet Commonly known as:  ACIPHEX Take 1 tablet (20 mg total) by mouth daily.   timolol 0.5 % ophthalmic gel-forming Commonly known as:  TIMOPTIC-XR Place 1 drop into both eyes daily.      Follow-up Information    [facility] [address] [facility] Follow up on 12/08/2016.   Specialty:  Cardiology Why:  pick up event monitor - 30 day monitor at 12:30pm Contact information: [address], [address] [place] [address] [phone]       Thurmon Fair, MD. Schedule an appointment as soon as possible for a visit in 1 month(s).   Specialty:  Cardiology Contact information: [address] [place] [address] [phone]        Sonda Primes, MD. Schedule an appointment as soon as possible for a visit in 1 week(s).   Specialty:  Internal Medicine Why:  Hospital Follow Up  Contact information: [address] [place] [address] [phone]          Allergies  Allergen Reactions  . Actonel [Risedronate Sodium]     Upset stomach  . Boniva [Ibandronate Sodium]     cramp  . Calcium Channel Blockers     Upset stomach  . Compazine     Daughter reacts to compazine/pt does not want to take  . Lyrica [Pregabalin]     Dizzy    Procedures/Studies: Dg Chest 2 View  Result Date: 11/29/2016 CLINICAL DATA:  Acute onset of high blood pressure. Initial encounter. EXAM: CHEST  2 VIEW COMPARISON:  Chest radiograph performed 11/12/2014 FINDINGS: The lungs are well-aerated and clear. There is no evidence of focal opacification, pleural effusion or pneumothorax. The heart is normal in size; the mediastinal contour is within normal limits. No  acute osseous abnormalities are seen. IMPRESSION: No acute cardiopulmonary process seen. Electronically Signed   By: Roanna Raider M.D.   On: 11/29/2016 03:20   Ct Head Wo Contrast  Result Date: 11/23/2016 CLINICAL DATA:  Recent dizziness and near syncope. Headaches. No history of recent injury. History of fallopian tube cancer 8 years ago. EXAM: CT HEAD WITHOUT CONTRAST TECHNIQUE: Contiguous axial images were obtained from the base of the skull through the vertex without intravenous contrast. COMPARISON:  None. FINDINGS: Brain: Ventricles are normal in size and configuration. There is mild generalized parenchymal atrophy, with bilateral frontal lobe predominance, with commensurate dilatation of the sulci. There is no mass, hemorrhage, edema or other evidence of acute parenchymal abnormality. No extra-axial hemorrhage. Vascular: No hyperdense vessel or unexpected calcification. Skull: Normal. Negative for fracture or focal lesion. Sinuses/Orbits: Visualized upper paranasal sinuses are clear. Visualized upper periorbital and retro-orbital soft tissues are unremarkable. Other: None. IMPRESSION: 1. No acute findings.  No intracranial mass, hemorrhage or edema. 2. Mild atrophy, with frontal lobe predominance. Electronically Signed   By: Bary Richard M.D.   On: 11/23/2016 14:21     Subjective: Pt feels better and wants to go home.    Discharge Exam: Vitals:   11/30/16 0418 11/30/16 0950  BP: (!) 140/54 (!) 114/53  Pulse: 63 74  Resp: 19 20  Temp: 97.8 F (36.6 C) 97.7 F (36.5 C)   Vitals:   11/29/16 1015 11/29/16 2143 11/30/16 0418 11/30/16 0950  BP: (!) 123/48 (!) 144/86 (!) 140/54 (!) 114/53  Pulse: 85 72 63 74  Resp: 18 19 19 20   Temp: 98.3 F (36.8 C) 99 F (37.2 C) 97.8 F (36.6 C) 97.7 F (36.5 C)  TempSrc: Oral Oral Oral Oral  SpO2: 99% 99% 98% 98%  Weight:      Height:        General: Pt is alert, awake, not in acute distress Cardiovascular: RRR, S1/S2 +, no rubs, no  gallops Respiratory: CTA bilaterally, no wheezing, no rhonchi Abdominal: Soft, NT, ND, bowel sounds + Extremities: no edema, no cyanosis  The results of significant diagnostics from this hospitalization (including imaging, microbiology, ancillary and laboratory) are listed below for reference.     Microbiology: No results found for this or any previous visit (from the past 240 hour(s)).   Labs: BNP (last 3 results) No results for input(s): BNP in the last 8760 hours. Basic Metabolic Panel:  Recent Labs Lab 11/28/16 2247 11/28/16 2350 11/29/16 0515 11/30/16 0547  NA 141  --  141 142  K 3.5  --  3.7 3.3*  CL 105  --  107 110  CO2 25  --  25 26  GLUCOSE 145*  --  115* 94  BUN 11  --  10 8  CREATININE 0.82  --  0.69 0.66  CALCIUM 9.2  --  8.8* 8.2*  MG  --  1.6*  --  1.8   Liver Function Tests:  Recent Labs Lab 11/29/16 0515  AST 19  ALT 19  ALKPHOS 58  BILITOT 0.7  PROT 6.7  ALBUMIN 3.7   No results for input(s): LIPASE, AMYLASE in the last 168 hours. No results for input(s): AMMONIA in the last 168 hours. CBC:  Recent Labs Lab 11/28/16 2247 11/29/16 0515 11/30/16 0547  WBC 7.9 6.8 5.4  NEUTROABS  --  4.5  --   HGB 11.9* 10.5* 10.7*  HCT 34.5* 31.8* 32.2*  MCV 85.2 84.8 86.6  PLT 208 199 178   Cardiac Enzymes:  Recent Labs Lab 11/29/16 0515  TROPONINI <0.03   BNP: Invalid input(s): POCBNP CBG:  Recent Labs Lab 11/28/16 2226  GLUCAP 158*   D-Dimer No results for input(s): DDIMER in the last 72 hours. Hgb A1c No results for input(s): HGBA1C in the last 72 hours. Lipid Profile No results for input(s): CHOL, HDL, LDLCALC, TRIG, CHOLHDL, LDLDIRECT in the last 72 hours. Thyroid function studies  Recent Labs  11/28/16 2350  TSH 1.530   Anemia work up No results for input(s): VITAMINB12, FOLATE, FERRITIN, TIBC, IRON, RETICCTPCT in the last 72 hours. Urinalysis    Component Value Date/Time   COLORURINE STRAW (A) 11/28/2016 2321    APPEARANCEUR CLEAR 11/28/2016 2321   LABSPEC 1.006 11/28/2016 2321   LABSPEC 1.025 02/03/2014 1104   PHURINE 5.0 11/28/2016 2321   GLUCOSEU NEGATIVE 11/28/2016 2321   GLUCOSEU NEGATIVE 11/16/2016 1440   GLUCOSEU Negative 02/03/2014 1104   HGBUR SMALL (A) 11/28/2016 2321   BILIRUBINUR NEGATIVE 11/28/2016 2321   BILIRUBINUR negative 12/22/2014 0850   BILIRUBINUR Negative 02/03/2014 1104   KETONESUR NEGATIVE 11/28/2016 2321   PROTEINUR NEGATIVE 11/28/2016 2321   UROBILINOGEN 0.2 11/16/2016 1440   UROBILINOGEN 0.2 02/03/2014 1104   NITRITE NEGATIVE 11/28/2016 2321   LEUKOCYTESUR NEGATIVE 11/28/2016 2321   LEUKOCYTESUR Negative 02/03/2014 1104   Sepsis Labs Invalid input(s): PROCALCITONIN,  WBC,  LACTICIDVEN Microbiology No results found for this or any previous visit (from the past 240 hour(s)).  Time coordinating discharge: 32 minutes  SIGNED:  Standley Dakins, MD  [facility] 11/30/2016, 4:28 PM Pager   If 7PM-7AM, please contact night-coverage [email or web address] Password [email or web address]

## 2016-12-01 ENCOUNTER — Telehealth: Payer: Self-pay | Admitting: Internal Medicine

## 2016-12-01 ENCOUNTER — Telehealth: Payer: Self-pay

## 2016-12-01 NOTE — Consult Note (Signed)
           [facility] CM Primary Care Navigator  12/01/2016  ANNEKE CUNDY [birth year] [id number]   Wentto see patient today at the bedside to identify possible discharge needs but staffreports that she had beendischarged.  Patient was discharged home yesterday evening.  Primary care provider's office called ([place])to notify of patient's discharge and need for post hospital follow-up and transition of care. [place] notified of need for close monitoring of patient's blood pressure and adjustment of blood pressure medications as well as close follow-up with primary care provider and cardiology.  Made aware to refer patient to [facility] care management as deemed necessary for services.  For questions, please contact:  Dannielle Huh, BSN, RN- [facility] Primary Care Navigator  Telephone: [phone] [place]

## 2016-12-01 NOTE — Telephone Encounter (Signed)
Transition Care Management Follow-up Telephone Call   Date discharged? 11/30/2016   How have you been since you were released from the hospital? Pt has been okay and is still having some near syncope episodes.    Do you understand why you were in the hospital? Yes   Do you understand the discharge instructions? Yes   Where were you discharged to? Home   Items Reviewed:  Medications reviewed:Yes  Allergies reviewed:Yes  Dietary changes reviewed: Yes  Referrals reviewed: Yes   Functional Questionnaire:   Activities of Daily Living (ADLs):   States they are independent in the following: All ADL's States they require assistance with the following: ADL's when light headedness.    Any transportation issues/concerns?:No   Any patient concerns? Pt is still feeling light headed.    Confirmed importance and date/time of follow-up visits scheduled Yes  Provider Appointment booked with PCP on 12/13/2016 at 10:30am  Confirmed with patient if condition begins to worsen call PCP or go to the ER.  Patient was given the office number and encouraged to call back with question or concerns:  Yes.

## 2016-12-01 NOTE — Telephone Encounter (Signed)
Routing to dr plotnikov, fyi.... 

## 2016-12-01 NOTE — Telephone Encounter (Signed)
Loraine from [facility] called stating that the pt was discharged yesterday evenining (11/30/16). She was there for elevated BP and hypertension. She needs a hospital follow up and they are recommending that the pt uses [facility]. They discontinued Clonodine and are weaning her off in the next 3 days. She will be needing adjustments made to her BP medications and a close fu with Dr Camila Li and her cardiologist.  Appointment has been scheduled for 12/13/16 at 10:30am.

## 2016-12-03 NOTE — Telephone Encounter (Signed)
Noted. Thx.

## 2016-12-08 ENCOUNTER — Ambulatory Visit (INDEPENDENT_AMBULATORY_CARE_PROVIDER_SITE_OTHER): Payer: Medicare Other

## 2016-12-08 ENCOUNTER — Institutional Professional Consult (permissible substitution): Payer: Medicare Other | Admitting: Internal Medicine

## 2016-12-08 ENCOUNTER — Other Ambulatory Visit: Payer: Self-pay | Admitting: Internal Medicine

## 2016-12-08 DIAGNOSIS — R55 Syncope and collapse: Secondary | ICD-10-CM | POA: Diagnosis not present

## 2016-12-13 ENCOUNTER — Encounter: Payer: Self-pay | Admitting: Internal Medicine

## 2016-12-13 ENCOUNTER — Ambulatory Visit (INDEPENDENT_AMBULATORY_CARE_PROVIDER_SITE_OTHER): Payer: Medicare Other | Admitting: Internal Medicine

## 2016-12-13 ENCOUNTER — Other Ambulatory Visit (INDEPENDENT_AMBULATORY_CARE_PROVIDER_SITE_OTHER): Payer: Medicare Other

## 2016-12-13 DIAGNOSIS — R55 Syncope and collapse: Secondary | ICD-10-CM

## 2016-12-13 DIAGNOSIS — D649 Anemia, unspecified: Secondary | ICD-10-CM | POA: Diagnosis not present

## 2016-12-13 DIAGNOSIS — R42 Dizziness and giddiness: Secondary | ICD-10-CM

## 2016-12-13 DIAGNOSIS — E559 Vitamin D deficiency, unspecified: Secondary | ICD-10-CM

## 2016-12-13 DIAGNOSIS — I10 Essential (primary) hypertension: Secondary | ICD-10-CM | POA: Diagnosis not present

## 2016-12-13 DIAGNOSIS — E538 Deficiency of other specified B group vitamins: Secondary | ICD-10-CM

## 2016-12-13 DIAGNOSIS — R197 Diarrhea, unspecified: Secondary | ICD-10-CM

## 2016-12-13 LAB — BASIC METABOLIC PANEL
BUN: 16 mg/dL (ref 6–23)
CHLORIDE: 105 meq/L (ref 96–112)
CO2: 27 meq/L (ref 19–32)
CREATININE: 0.83 mg/dL (ref 0.40–1.20)
Calcium: 9.6 mg/dL (ref 8.4–10.5)
GFR: 72.03 mL/min (ref >60.00)
Glucose, Bld: 111 mg/dL — ABNORMAL HIGH (ref 70–99)
Potassium: 4.3 mEq/L (ref 3.5–5.1)
Sodium: 138 mEq/L (ref 135–145)

## 2016-12-13 LAB — CBC WITH DIFFERENTIAL/PLATELET
BASOS PCT: 0.6 % (ref 0.0–3.0)
Basophils Absolute: 0 10*3/uL (ref 0.0–0.1)
EOS ABS: 0.1 10*3/uL (ref 0.0–0.7)
EOS PCT: 0.9 % (ref 0.0–5.0)
HEMATOCRIT: 37.1 % (ref 36.0–46.0)
Hemoglobin: 12.7 g/dL (ref 12.0–15.0)
LYMPHS PCT: 24.3 % (ref 12.0–46.0)
Lymphs Abs: 1.7 10*3/uL (ref 0.7–4.0)
MCHC: 34.3 g/dL (ref 30.0–36.0)
MCV: 85.1 fl (ref 78.0–100.0)
Monocytes Absolute: 0.6 10*3/uL (ref 0.1–1.0)
Monocytes Relative: 7.8 % (ref 3.0–12.0)
NEUTROS ABS: 4.7 10*3/uL (ref 1.4–7.7)
Neutrophils Relative %: 66.4 % (ref 43.0–77.0)
Platelets: 244 10*3/uL (ref 150.0–400.0)
RBC: 4.35 Mil/uL (ref 3.87–5.11)
RDW: 14 % (ref 11.5–15.5)
WBC: 7 10*3/uL (ref 4.0–10.5)

## 2016-12-13 MED ORDER — CLONAZEPAM 0.25 MG PO TBDP: 0.2500 mg | ORAL_TABLET | Freq: Two times a day (BID) | ORAL | 1 refills | Status: DC

## 2016-12-13 NOTE — Assessment & Plan Note (Addendum)
No relapse Off Clonidine On a heart monitor now ??panic attacks - Clonazepam wafer prn No driving for now

## 2016-12-13 NOTE — Assessment & Plan Note (Signed)
On B12 nasal

## 2016-12-13 NOTE — Progress Notes (Signed)
Subjective:  Patient ID: [name], female    DOB: March 22, 1946  Age: 71 y.o. MRN: [id number]  CC: No chief complaint on file.   HPI [name] presents for post-hosp f/u for lightheadedness 3/26-3/28 - off Clonidine now. BP ok at home  Outpatient Medications Prior to Visit  Medication Sig Dispense Refill  . aspirin 81 MG EC tablet Take 81 mg by mouth daily.      . bimatoprost (LUMIGAN) 0.03 % ophthalmic solution Place 1 drop into both eyes at bedtime.      . Cholecalciferol (VITAMIN D3 SUPER STRENGTH) 2000 UNITS TABS Take 2 tablets (4,000 Units total) by mouth every morning. 100 each 3  . colestipol (COLESTID) 1 g tablet Take 2 tablets (2 g total) by mouth 2 (two) times daily. (Patient taking differently: Take 1 g by mouth daily with lunch. ) 120 tablet 3  . denosumab (PROLIA) 60 MG/ML SOLN injection Inject 60 mg into the skin every 6 (six) months. Administer in upper arm, thigh, or abdomen    . diphenoxylate-atropine (LOMOTIL) 2.5-0.025 MG tablet Take 1 tablet by mouth 4 (four) times daily as needed for diarrhea or loose stools. (Patient taking differently: Take 1 tablet by mouth 2 (two) times daily. ) 60 tablet 3  . NASCOBAL 500 MCG/0.1ML SOLN USE ONE SPRAY NASALLY ONCE  A WEEK 3 Bottle 1  . potassium chloride (K-DUR,KLOR-CON) 10 MEQ tablet Take 1 tablet (10 mEq total) by mouth 2 (two) times daily. (Patient taking differently: Take 10 mEq by mouth every 3 (three) days. ) 30 tablet 11  . RABEprazole (ACIPHEX) 20 MG tablet Take 1 tablet (20 mg total) by mouth daily. 90 tablet 3  . timolol (TIMOPTIC-XR) 0.5 % ophthalmic gel-forming Place 1 drop into both eyes daily.      No facility-administered medications prior to visit.     ROS Review of Systems  Constitutional: Positive for diaphoresis. Negative for activity change, appetite change, chills, fatigue and unexpected weight change.  HENT: Negative for congestion, mouth sores and sinus pressure.   Eyes: Negative for visual  disturbance.  Respiratory: Negative for cough and chest tightness.   Gastrointestinal: Negative for abdominal pain and nausea.  Genitourinary: Negative for difficulty urinating, frequency and vaginal pain.  Musculoskeletal: Negative for back pain and gait problem.  Skin: Negative for pallor and rash.  Neurological: Positive for light-headedness. Negative for dizziness, tremors, weakness, numbness and headaches.  Psychiatric/Behavioral: Negative for confusion, sleep disturbance and suicidal ideas.    Objective:  BP 124/76 (BP Location: Left Arm, Patient Position: Sitting, Cuff Size: Large)   Pulse 76   Temp 97.5 F (36.4 C) (Oral)   Ht 5\' 3"  (1.6 m)   Wt 187 lb (84.8 kg)   SpO2 98%   BMI 33.13 kg/m   BP Readings from Last 3 Encounters:  12/13/16 124/76  11/30/16 (!) 111/56  11/16/16 (!) 160/92    Wt Readings from Last 3 Encounters:  12/13/16 187 lb (84.8 kg)  11/29/16 189 lb 2.5 oz (85.8 kg)  11/16/16 190 lb 8 oz (86.4 kg)    Physical Exam  Constitutional: She appears well-developed. No distress.  HENT:  Head: Normocephalic.  Right Ear: External ear normal.  Left Ear: External ear normal.  Nose: Nose normal.  Mouth/Throat: Oropharynx is clear and moist.  Eyes: Conjunctivae are normal. Pupils are equal, round, and reactive to light. Right eye exhibits no discharge. Left eye exhibits no discharge.  Neck: Normal range of motion. Neck supple. No  JVD present. No tracheal deviation present. No thyromegaly present.  Cardiovascular: Normal rate, regular rhythm and normal heart sounds.   Pulmonary/Chest: No stridor. No respiratory distress. She has no wheezes.  Abdominal: Soft. Bowel sounds are normal. She exhibits no distension and no mass. There is no tenderness. There is no rebound and no guarding.  Musculoskeletal: She exhibits no edema or tenderness.  Lymphadenopathy:    She has no cervical adenopathy.  Neurological: She displays normal reflexes. No cranial nerve deficit.  She exhibits normal muscle tone. Coordination normal.  Skin: No rash noted. No erythema.  Psychiatric: She has a normal mood and affect. Her behavior is normal. Judgment and thought content normal.    Lab Results  Component Value Date   WBC 5.4 11/30/2016   HGB 10.7 (L) 11/30/2016   HCT 32.2 (L) 11/30/2016   PLT 178 11/30/2016   GLUCOSE 94 11/30/2016   CHOL 170 09/26/2016   TRIG 148.0 09/26/2016   HDL 40.10 09/26/2016   LDLCALC 101 (H) 09/26/2016   ALT 19 11/29/2016   AST 19 11/29/2016   NA 142 11/30/2016   K 3.3 (L) 11/30/2016   CL 110 11/30/2016   CREATININE 0.66 11/30/2016   BUN 8 11/30/2016   CO2 26 11/30/2016   TSH 1.530 11/28/2016   HGBA1C 6.0 09/26/2016    Dg Chest 2 View  Result Date: 11/29/2016 CLINICAL DATA:  Acute onset of high blood pressure. Initial encounter. EXAM: CHEST  2 VIEW COMPARISON:  Chest radiograph performed 11/12/2014 FINDINGS: The lungs are well-aerated and clear. There is no evidence of focal opacification, pleural effusion or pneumothorax. The heart is normal in size; the mediastinal contour is within normal limits. No acute osseous abnormalities are seen. IMPRESSION: No acute cardiopulmonary process seen. Electronically Signed   By: Garald Balding M.D.   On: 11/29/2016 03:20    Assessment & Plan:   There are no diagnoses linked to this encounter. I am having Ms. Mcelwee maintain her aspirin, bimatoprost, timolol, denosumab, Cholecalciferol, potassium chloride, colestipol, RABEprazole, diphenoxylate-atropine, and NASCOBAL.  No orders of the defined types were placed in this encounter.    Follow-up: No Follow-up on file.  Walker Kehr, MD

## 2016-12-13 NOTE — Progress Notes (Signed)
Pre visit review using our clinic review tool, if applicable. No additional management support is needed unless otherwise documented below in the visit note. 

## 2016-12-13 NOTE — Assessment & Plan Note (Signed)
Better  

## 2016-12-13 NOTE — Assessment & Plan Note (Addendum)
On Cholestid F/u w/Dr Henrene Pastor Colon pending May consider a CT of abd if colon is (-)

## 2016-12-13 NOTE — Assessment & Plan Note (Signed)
Labs

## 2016-12-13 NOTE — Assessment & Plan Note (Signed)
On Vit D 

## 2016-12-13 NOTE — Assessment & Plan Note (Signed)
Discussed.

## 2016-12-15 ENCOUNTER — Inpatient Hospital Stay: Payer: Medicare Other | Admitting: Internal Medicine

## 2016-12-19 ENCOUNTER — Other Ambulatory Visit: Payer: Self-pay | Admitting: Internal Medicine

## 2016-12-19 ENCOUNTER — Ambulatory Visit (AMBULATORY_SURGERY_CENTER): Payer: Self-pay

## 2016-12-19 ENCOUNTER — Ambulatory Visit: Payer: Medicare Other | Admitting: Internal Medicine

## 2016-12-19 ENCOUNTER — Telehealth: Payer: Self-pay

## 2016-12-19 VITALS — Ht 63.0 in | Wt 187.0 lb

## 2016-12-19 DIAGNOSIS — Z86010 Personal history of colonic polyps: Secondary | ICD-10-CM

## 2016-12-19 DIAGNOSIS — Z1231 Encounter for screening mammogram for malignant neoplasm of breast: Secondary | ICD-10-CM

## 2016-12-19 MED ORDER — NA SULFATE-K SULFATE-MG SULF 17.5-3.13-1.6 GM/177ML PO SOLN: 1.0000 | Freq: Once | ORAL | 0 refills | Status: AC

## 2016-12-19 NOTE — Telephone Encounter (Signed)
She should wait and reschedule after she has been cleared by her cardiologist. Thanks

## 2016-12-19 NOTE — Telephone Encounter (Signed)
Dr. Henrene Pastor,    I just saw [name] in [facility]. Patient informed me that she is wearing a heart monitor due to erratic blood pressure. Patient was seen in the ER on 11/28/16 with elevated BP and near syncope.  Heart rate was 135 per patient. Patient states Dr. [name] took her off of her BP medication while she has the heart monitor. [name] is under the care of Dr. Caryl Comes. Patient will wear the heart monitor until May 4th, and she follows up with Dr. Caryl Comes on May 17. [name] is scheduled for a colonoscopy on 01/02/17 @ 1:30 pm. Should the patient proceed with her colonoscopy as planned or should she wait until after she see's Dr. Caryl Comes on May 17 th ? Please advise.  Riki Sheer, LPN

## 2016-12-19 NOTE — Progress Notes (Signed)
Denies allergies to eggs or soy products. Denies complication of anesthesia or sedation. Denies use of weight loss medication. Denies use of O2.   Emmi instructions given for colonoscopy.  

## 2016-12-20 NOTE — Telephone Encounter (Signed)
Patient was notified that we will cancel colonoscopy on 01/02/17 per Dr. Henrene Pastor. Patient was advised to reschedule after she has cardiac clearance from Dr. Caryl Comes. Patient was relieved to schedule colonoscopy at a later time.    Riki Sheer, LPN

## 2017-01-02 ENCOUNTER — Encounter: Payer: Medicare Other | Admitting: Internal Medicine

## 2017-01-09 ENCOUNTER — Other Ambulatory Visit: Payer: Self-pay | Admitting: Internal Medicine

## 2017-01-09 ENCOUNTER — Ambulatory Visit
Admission: RE | Admit: 2017-01-09 | Discharge: 2017-01-09 | Disposition: A | Discharge status: Home / Self Care | Payer: Medicare Other | Source: Ambulatory Visit | Attending: Internal Medicine | Admitting: Internal Medicine

## 2017-01-09 ENCOUNTER — Encounter: Payer: Self-pay | Admitting: *Deleted

## 2017-01-09 ENCOUNTER — Emergency Department (INDEPENDENT_AMBULATORY_CARE_PROVIDER_SITE_OTHER)
Admission: EM | Admit: 2017-01-09 | Discharge: 2017-01-09 | Disposition: A | Discharge status: Home / Self Care | Payer: Medicare Other | Source: Home / Self Care | Attending: Family Medicine | Admitting: Family Medicine

## 2017-01-09 ENCOUNTER — Emergency Department (INDEPENDENT_AMBULATORY_CARE_PROVIDER_SITE_OTHER): Payer: Medicare Other

## 2017-01-09 DIAGNOSIS — M7731 Calcaneal spur, right foot: Secondary | ICD-10-CM | POA: Diagnosis not present

## 2017-01-09 DIAGNOSIS — R922 Inconclusive mammogram: Secondary | ICD-10-CM | POA: Diagnosis not present

## 2017-01-09 DIAGNOSIS — N6489 Other specified disorders of breast: Secondary | ICD-10-CM

## 2017-01-09 DIAGNOSIS — M7661 Achilles tendinitis, right leg: Secondary | ICD-10-CM | POA: Diagnosis not present

## 2017-01-09 DIAGNOSIS — M19071 Primary osteoarthritis, right ankle and foot: Secondary | ICD-10-CM | POA: Diagnosis not present

## 2017-01-09 DIAGNOSIS — Z1231 Encounter for screening mammogram for malignant neoplasm of breast: Secondary | ICD-10-CM

## 2017-01-09 MED ORDER — HYDROCODONE-ACETAMINOPHEN 5-325 MG PO TABS: ORAL_TABLET | ORAL | 0 refills | Status: DC

## 2017-01-09 MED ORDER — CELECOXIB 100 MG PO CAPS: 100.0000 mg | ORAL_CAPSULE | Freq: Two times a day (BID) | ORAL | 1 refills | Status: DC

## 2017-01-09 NOTE — ED Triage Notes (Signed)
Pt c/o RT heel pain x 2 days. Denies injury. She has taken tylenol, Aleve and applied ice. Last dose aleve at 0400 today.

## 2017-01-09 NOTE — ED Provider Notes (Signed)
[facility]    CSN: [id number] Arrival date & time: 01/09/17  1550     History   Chief Complaint Chief Complaint  Patient presents with  . Foot Pain    HPI ONESTI BONFIGLIO is a 71 y.o. female.   Patient complains of onset of pain in her right heel two days ago without known injury or change in activities.  No new shoes, etc.  She has significant pain with weight-bearing.  She notes that she has scheduled an appointment with an orthopedist in 3 days.   The history is provided by the patient and the spouse.  Foot Pain  This is a new problem. The current episode started 2 days ago. The problem occurs constantly. The problem has been gradually worsening. Associated symptoms comments: none. The symptoms are aggravated by walking and standing. Nothing relieves the symptoms. Treatments tried: Aleve. The treatment provided mild relief.    Past Medical History:  Diagnosis Date  . Adenomatous colon polyp 04/08/2011  . Anemia   . Anxiety   . GERD (gastroesophageal reflux disease)   . Glaucoma (increased eye pressure)   . Heart murmur   . HTN (hypertension)   . IBS (irritable bowel syndrome)   . LBP (low back pain)   . Menopause   . Osteoporosis   . Ovarian cancer ([place]) 09/2008   Dr Gwyneth Revels  . Pancreatic cyst   . Vitamin B12 deficiency   . Vitamin D deficiency     Patient Active Problem List   Diagnosis Date Noted  . Anemia 12/13/2016  . Near syncope 11/16/2016  . Hypokalemia 05/18/2016  . Hematochezia 10/20/2015  . Genetic testing 05/04/2015  . History of colonic polyps 05/04/2015  . Family history of stomach cancer 05/04/2015  . Dense breast tissue 03/28/2015  . Flank pain 12/22/2014  . Well adult exam 11/12/2014  . IBS (irritable bowel syndrome) 07/09/2014  . LLQ abdominal pain 07/07/2014  . Obstipation 06/20/2014  . Dizziness, nonspecific 02/24/2014  . Fallopian tube cancer, carcinoma ([place]) 02/03/2014  . Diarrhea 01/20/2014  . Left groin pain  10/07/2013  . Piriformis syndrome of left side 09/23/2013  . Leg length discrepancy 09/23/2013  . Osteoporosis, unspecified 02/05/2013  . Hyperglycemia 02/05/2013  . Dyslipidemia 02/05/2013  . Anxiety 06/03/2011  . Paresthesia of both legs 12/03/2010  . Paresthesia of hand 12/03/2010  . ACUTE BRONCHITIS 08/27/2009  . Neoplasm of digestive system 09/25/2008  . Abdominal pain 09/22/2008  . WARTS, VIRAL, UNSPECIFIED 11/12/2007  . Vitamin D deficiency 11/12/2007  . GERD 11/12/2007  . Essential hypertension 08/10/2007  . HIP PAIN 08/10/2007  . LOW BACK PAIN 08/10/2007  . B12 deficiency 05/31/2007    Past Surgical History:  Procedure Laterality Date  . ABDOMINAL HYSTERECTOMY    . APPENDECTOMY    . [place]  2001  . Ovarian Cancer Debulking  09/2008    OB History    No data available       Home Medications    Prior to Admission medications   Medication Sig Start Date End Date Taking? Authorizing Provider  aspirin 81 MG EC tablet Take 81 mg by mouth daily.      [provider]  bimatoprost (LUMIGAN) 0.03 % ophthalmic solution Place 1 drop into both eyes at bedtime.      [provider]  celecoxib (CELEBREX) 100 MG capsule Take 1 capsule (100 mg total) by mouth 2 (two) times daily. Take with food 01/09/17 01/09/18  Kandra Nicolas, MD  Cholecalciferol (VITAMIN D3 SUPER STRENGTH) 2000 UNITS TABS Take 2 tablets (4,000 Units total) by mouth every morning. Patient not taking: Reported on 12/19/2016 05/15/15   [name], Evie Lacks, MD  clonazePAM (KLONOPIN) 0.25 MG disintegrating tablet Take 1-2 tablets (0.25-0.5 mg total) by mouth 2 (two) times daily. 12/13/16   [name], Evie Lacks, MD  colestipol (COLESTID) 1 g tablet Take 2 tablets (2 g total) by mouth 2 (two) times daily. Patient taking differently: Take 1 g by mouth daily with lunch.  08/25/16   Irene Shipper, MD  denosumab (PROLIA) 60 MG/ML SOLN injection Inject 60 mg into the skin every 6 (six) months.  Administer in upper arm, thigh, or abdomen 09/02/13   [provider]  diphenoxylate-atropine (LOMOTIL) 2.5-0.025 MG tablet Take 1 tablet by mouth 4 (four) times daily as needed for diarrhea or loose stools. Patient taking differently: Take 1 tablet by mouth 2 (two) times daily.  08/25/16   Irene Shipper, MD  HYDROcodone-acetaminophen (NORCO/VICODIN) 5-325 MG tablet Take one by mouth at bedtime as needed for pain 01/09/17   Kandra Nicolas, MD  NASCOBAL 500 MCG/0.1ML SOLN USE ONE SPRAY NASALLY ONCE  A WEEK 12/08/16   [name], Evie Lacks, MD  potassium chloride (K-DUR,KLOR-CON) 10 MEQ tablet Take 1 tablet (10 mEq total) by mouth 2 (two) times daily. Patient taking differently: Take 10 mEq by mouth every 3 (three) days.  05/18/16   [name], Evie Lacks, MD  RABEprazole (ACIPHEX) 20 MG tablet Take 1 tablet (20 mg total) by mouth daily. 08/25/16   Irene Shipper, MD  timolol (TIMOPTIC-XR) 0.5 % ophthalmic gel-forming Place 1 drop into both eyes daily.  03/03/11   [provider]    Family History Family History  Problem Relation Age of Onset  . Alzheimer's disease Mother 58  . Other Mother 2    TAH for excessive bleeding  . Lung cancer Father     smoker; metastasis to stomach and other areas  . Heart attack Maternal Uncle   . Other Paternal Aunt     stomach issues  . Heart Problems Paternal Uncle   . Other Maternal Grandmother     stomach issues; +hysterectomy  . Heart attack Maternal Grandfather   . Infertility Daughter   . Stomach cancer Cousin     dx. mid-60s  . Other Cousin     stomach issues  . Leukemia Cousin 18  . Stomach cancer Other   . Cancer Cousin     unknown type  . Other Cousin     stomach issues  . Heart Problems Maternal Uncle   . Diabetes Paternal Aunt   . Heart Problems Paternal Uncle   . Emphysema Paternal Uncle     work exposure  . Breast cancer Cousin     dx. late 60s-early 70s  . Colon cancer Neg Hx   . Rectal cancer Neg Hx   . Esophageal  cancer Neg Hx     Social History Social History  Substance Use Topics  . Smoking status: Never Smoker  . Smokeless tobacco: Never Used  . Alcohol use No     Allergies   Actonel [risedronate sodium]; Boniva [ibandronate sodium]; Calcium channel blockers; Compazine; and Lyrica [pregabalin]   Review of Systems Review of Systems  All other systems reviewed and are negative.    Physical Exam Triage Vital Signs ED Triage Vitals [01/09/17 1619]  Enc Vitals Group     BP 117/73     Pulse Rate 80  Resp 16     Temp 97.7 F (36.5 C)     Temp Source Oral     SpO2 97 %     Weight      Height      Head Circumference      Peak Flow      Pain Score 5     Pain Loc      Pain Edu?      Excl. in [place]?    No data found.   Updated Vital Signs BP 117/73 (BP Location: Left Arm)   Pulse 80   Temp 97.7 F (36.5 C) (Oral)   Resp 16   SpO2 97%   Visual Acuity Right Eye Distance:   Left Eye Distance:   Bilateral Distance:    Right Eye Near:   Left Eye Near:    Bilateral Near:     Physical Exam  Constitutional: She appears well-developed and well-nourished. No distress.  HENT:  Head: Normocephalic.  Eyes: Pupils are equal, round, and reactive to light.  Neck: Normal range of motion.  Cardiovascular: Normal rate.   Pulmonary/Chest: Effort normal.  Musculoskeletal:       Right foot: There is decreased range of motion, tenderness and bony tenderness. There is no swelling.       Feet:  Right foot has distinct tenderness to palpation over the insertion of the achilles tendon, without swelling or erythema.  Achilles tendon is intact.  Patient has pain with plantar flexion of her right ankle.  Neurological: She is alert.  Skin: Skin is warm and dry.  Nursing note and vitals reviewed.    UC Treatments / Results  Labs (all labs ordered are listed, but only abnormal results are displayed) Labs Reviewed - No data to display  EKG  EKG Interpretation None        Radiology Dg Foot Complete Right  Result Date: 01/09/2017 CLINICAL DATA:  Right foot and heel pain for 2 days. No known injury. EXAM: RIGHT FOOT COMPLETE - 3+ VIEW COMPARISON:  None. FINDINGS: No acute bony or joint abnormality is identified. Midfoot osteoarthritis appears worst at the first tarsometatarsal joint. Small plantar calcaneal spur is noted. Soft tissues are unremarkable. IMPRESSION: No acute finding. Midfoot osteoarthritis appears worst at the first TMT joint. Plantar calcaneal spur. Electronically Signed   By: Inge Rise M.D.   On: 01/09/2017 16:35   Mm Diag Breast Tomo Bilateral  Result Date: 01/09/2017 CLINICAL DATA:  Patient with bilateral breast fullness. EXAM: 2D DIGITAL DIAGNOSTIC BILATERAL MAMMOGRAM WITH CAD AND ADJUNCT TOMO COMPARISON:  Previous exam(s). ACR Breast Density Category c: The breast tissue is heterogeneously dense, which may obscure small masses. FINDINGS: No concerning masses, calcifications or nonsurgical distortion identified within either breast. Mammographic images were processed with CAD. IMPRESSION: No mammographic evidence for malignancy. RECOMMENDATION: Screening mammogram in one year.(Code:SM-B-01Y) I have discussed the findings and recommendations with the patient. Results were also provided in writing at the conclusion of the visit. If applicable, a reminder letter will be sent to the patient regarding the next appointment. BI-RADS CATEGORY  1: Negative. Electronically Signed   By: Lovey Newcomer M.D.   On: 01/09/2017 14:18    Procedures Procedures (including critical care time)  Medications Ordered in UC Medications - No data to display   Initial Impression / Assessment and Plan / UC Course  I have reviewed the triage vital signs and the nursing notes.  Pertinent labs & imaging results that were available during my care of  the patient were reviewed by me and considered in my medical decision making (see chart for details).    Begin Celebrex  100mg  BID. Rx for Lortab at bedtime. Continue to use heel lifts.  Begin stretching exercises as tolerated.  Put ice on the injured area:  Put ice in a plastic bag.  Place a towel between your skin and the bag.  Leave the ice on for 20 minutes, 2-3 times a day  Followup with orthopedist as scheduled.  Final Clinical Impressions(s) / UC Diagnoses   Final diagnoses:  Tendonitis, Achilles, right    New Prescriptions New Prescriptions   CELECOXIB (CELEBREX) 100 MG CAPSULE    Take 1 capsule (100 mg total) by mouth 2 (two) times daily. Take with food   HYDROCODONE-ACETAMINOPHEN (NORCO/VICODIN) 5-325 MG TABLET    Take one by mouth at bedtime as needed for pain     Kandra Nicolas, MD 01/10/17 1008

## 2017-01-09 NOTE — Discharge Instructions (Signed)
Continue to use heel lifts.  Begin stretching exercises as tolerated. Put ice on the injured area: Put ice in a plastic bag. Place a towel between your skin and the bag. Leave the ice on for 20 minutes, 2-3 times a day

## 2017-01-12 DIAGNOSIS — M7661 Achilles tendinitis, right leg: Secondary | ICD-10-CM | POA: Diagnosis not present

## 2017-01-19 ENCOUNTER — Ambulatory Visit (INDEPENDENT_AMBULATORY_CARE_PROVIDER_SITE_OTHER): Payer: Medicare Other | Admitting: Internal Medicine

## 2017-01-19 VITALS — BP 140/90 | HR 80 | Ht 63.0 in | Wt 192.0 lb

## 2017-01-19 DIAGNOSIS — R55 Syncope and collapse: Secondary | ICD-10-CM

## 2017-01-19 MED ORDER — MAGNESIUM OXIDE 400 MG PO CAPS: 400.0000 mg | ORAL_CAPSULE | Freq: Two times a day (BID) | ORAL | 0 refills | Status: DC

## 2017-01-19 MED ORDER — HYDRALAZINE HCL 25 MG PO TABS: ORAL_TABLET | ORAL | 3 refills | Status: DC

## 2017-01-19 NOTE — Patient Instructions (Addendum)
Medication Instructions: - Your physician has recommended you make the following change in your medication:  1) Start Magnesium OTC (over the counter) 400 mg- take one tablet by mouth twice daily 2) Start Hydralazine 25 mg- take one tablet by mouth every 6 hours has needed  Labwork: - none ordered  Procedures/Testing: - none ordered  Follow-Up: - Dr. Caryl Comes will see you back on an as needed basis.  Any Additional Special Instructions Will Be Listed Below (If Applicable).     If you need a refill on your cardiac medications before your next appointment, please call your pharmacy.

## 2017-01-19 NOTE — Progress Notes (Signed)
ELECTROPHYSIOLOGY CONSULT NOTE  Patient ID: [name], MRN: [id number], DOB/AGE: 1945/10/09 71 y.o. Admit date: (Not on file) Date of Consult: 01/19/2017  Primary Physician: Cassandria Anger, MD Primary Cardiologist: *MCr   [name] is being seen today for the evaluation of spells at the request of  [name], Evie Lacks, MD.   HPI [name] is a 71 y.o. female  Was admitted to the hospital 3/18 following the second  Of a series of spells characterized by flush coming up over the top of her head.  When nature. Vital signs and with this the blood pressure was over 185 systolic and heart rate was 130 or so. EMS was called. The EMS record was reviewed. It confirmed blood pressures over 200/100. The heart rate was 109. Blood pressures and heart rate remained elevated after [facility]  Troponin levels were normal. TSH was normal;  plasma metanephrine was normal Echocardiogram was normal apart from an elevated E/E' and mild LVH  She has had a few other spells. She has stopped taking most of her medicines though including the clonidine which was used acutely. She brings in blood pressure recordings from home and they are most all the range of 120-130 with the rare outlier 160.  Outpatient rhythm recorder demonstrated sinus tachycardia and a rare PVC       Past Medical History:  Diagnosis Date  . Adenomatous colon polyp 04/08/2011  . Anemia   . Anxiety   . GERD (gastroesophageal reflux disease)   . Glaucoma (increased eye pressure)   . Heart murmur   . HTN (hypertension)   . IBS (irritable bowel syndrome)   . LBP (low back pain)   . Menopause   . Osteoporosis   . Ovarian cancer ([place]) 09/2008   Dr Gwyneth Revels  . Pancreatic cyst   . Vitamin B12 deficiency   . Vitamin D deficiency       Surgical History:  Past Surgical History:  Procedure Laterality Date  . ABDOMINAL HYSTERECTOMY    . APPENDECTOMY    . [place]  2001  . Ovarian  Cancer Debulking  09/2008     Home Meds: Prior to Admission medications   Medication Sig Start Date End Date Taking? Authorizing Provider  aspirin 81 MG EC tablet Take 81 mg by mouth daily.     Yes [provider]  bimatoprost (LUMIGAN) 0.03 % ophthalmic solution Place 1 drop into both eyes at bedtime.     Yes [provider]  Cholecalciferol (VITAMIN D3 SUPER STRENGTH) 2000 UNITS TABS Take 2 tablets (4,000 Units total) by mouth every morning. 05/15/15  Yes [name], Evie Lacks, MD  clonazePAM (KLONOPIN) 0.25 MG disintegrating tablet Take 1-2 tablets (0.25-0.5 mg total) by mouth 2 (two) times daily. 12/13/16  Yes [name], Evie Lacks, MD  colestipol (COLESTID) 1 g tablet Take 2 tablets (2 g total) by mouth 2 (two) times daily. Patient taking differently: Take 1 g by mouth daily with lunch.  08/25/16  Yes Irene Shipper, MD  denosumab (PROLIA) 60 MG/ML SOLN injection Inject 60 mg into the skin every 6 (six) months. Administer in upper arm, thigh, or abdomen 09/02/13  Yes [provider]  diphenoxylate-atropine (LOMOTIL) 2.5-0.025 MG tablet Take 1 tablet by mouth 4 (four) times daily as needed for diarrhea or loose stools. Patient taking differently: Take 1 tablet by mouth 2 (two) times daily.  08/25/16  Yes Irene Shipper, MD  HYDROcodone-acetaminophen (NORCO/VICODIN) [phone]  MG tablet Take one by mouth at bedtime as needed for pain 01/09/17  Yes Beese, Ishmael Holter, MD  NASCOBAL 500 MCG/0.1ML SOLN USE ONE SPRAY NASALLY ONCE  A WEEK 12/08/16  Yes [name], Evie Lacks, MD  potassium chloride (K-DUR,KLOR-CON) 10 MEQ tablet Take 1 tablet (10 mEq total) by mouth 2 (two) times daily. Patient taking differently: Take 10 mEq by mouth every 3 (three) days.  05/18/16  Yes [name], Evie Lacks, MD  RABEprazole (ACIPHEX) 20 MG tablet Take 1 tablet (20 mg total) by mouth daily. 08/25/16  Yes Irene Shipper, MD  timolol (TIMOPTIC-XR) 0.5 % ophthalmic gel-forming Place 1 drop into both eyes daily.   03/03/11  Yes [provider]    Allergies:  Allergies  Allergen Reactions  . Actonel [Risedronate Sodium]     Upset stomach  . Boniva [Ibandronate Sodium]     cramp  . Calcium Channel Blockers     Upset stomach  . Compazine     Daughter reacts to compazine/pt does not want to take  . Lyrica [Pregabalin]     Dizzy     Social History   Social History  . Marital status: Married    Spouse name: Dominica Severin  . Number of children: 2  . Years of education: N/A   Occupational History  . Homemaker Homemaker   Social History Main Topics  . Smoking status: Never Smoker  . Smokeless tobacco: Never Used  . Alcohol use No  . Drug use: No  . Sexual activity: Not on file   Other Topics Concern  . Not on file   Social History Narrative  . No narrative on file     Family History  Problem Relation Age of Onset  . Alzheimer's disease Mother 29  . Other Mother 25       TAH for excessive bleeding  . Lung cancer Father        smoker; metastasis to stomach and other areas  . Heart attack Maternal Uncle   . Other Paternal Aunt        stomach issues  . Heart Problems Paternal Uncle   . Other Maternal Grandmother        stomach issues; +hysterectomy  . Heart attack Maternal Grandfather   . Infertility Daughter   . Stomach cancer Cousin        dx. mid-60s  . Other Cousin        stomach issues  . Leukemia Cousin 18  . Stomach cancer Other   . Cancer Cousin        unknown type  . Other Cousin        stomach issues  . Heart Problems Maternal Uncle   . Diabetes Paternal Aunt   . Heart Problems Paternal Uncle   . Emphysema Paternal Uncle        work exposure  . Breast cancer Cousin        dx. late 60s-early 70s  . Colon cancer Neg Hx   . Rectal cancer Neg Hx   . Esophageal cancer Neg Hx      ROS:  Please see the history of present illness.     All other systems reviewed and negative.    Physical Exam: Blood pressure 140/90, pulse 80, height 5\' 3"  (1.6 m), weight  192 lb (87.1 kg), SpO2 98 %. General: Well developed, well nourished female in no acute distress. Head: Normocephalic, atraumatic, sclera non-icteric, no xanthomas, nares are without discharge. EENT: normal  Lymph Nodes:  none Neck: Negative for carotid bruits. JVD not elevated. Back:without scoliosis kyphosis Lungs: Clear bilaterally to auscultation without wheezes, rales, or rhonchi. Breathing is unlabored. Heart: RRR with S1 S2. No  murmur . No rubs, or gallops appreciated. Abdomen: Soft, non-tender, non-distended with normoactive bowel sounds. No hepatomegaly. No rebound/guarding. No obvious abdominal masses. Msk:  Strength and tone appear normal for age. Extremities: No clubbing or cyanosis. No edema.  Distal pedal pulses are 2+ and equal bilaterally. Skin: Warm and Dry Neuro: Alert and oriented X 3. CN III-XII intact Grossly normal sensory and motor function . Psych:  Responds to questions appropriately with a normal affect.      Labs: Cardiac Enzymes No results for input(s): CKTOTAL, CKMB, TROPONINI in the last 72 hours. CBC Lab Results  Component Value Date   WBC 7.0 12/13/2016   HGB 12.7 12/13/2016   HCT 37.1 12/13/2016   MCV 85.1 12/13/2016   PLT 244.0 12/13/2016   PROTIME: No results for input(s): LABPROT, INR in the last 72 hours. Chemistry No results for input(s): NA, K, CL, CO2, BUN, CREATININE, CALCIUM, PROT, BILITOT, ALKPHOS, ALT, AST, GLUCOSE in the last 168 hours.  Invalid input(s): LABALBU Lipids Lab Results  Component Value Date   CHOL 170 09/26/2016   HDL 40.10 09/26/2016   LDLCALC 101 (H) 09/26/2016   TRIG 148.0 09/26/2016   BNP No results found for: PROBNP Thyroid Function Tests: No results for input(s): TSH, T4TOTAL, T3FREE, THYROIDAB in the last 72 hours.  Invalid input(s): FREET3 Miscellaneous No results found for: DDIMER  Radiology/Studies:  Dg Foot Complete Right  Result Date: 01/09/2017 CLINICAL DATA:  Right foot and heel pain for 2  days. No known injury. EXAM: RIGHT FOOT COMPLETE - 3+ VIEW COMPARISON:  None. FINDINGS: No acute bony or joint abnormality is identified. Midfoot osteoarthritis appears worst at the first tarsometatarsal joint. Small plantar calcaneal spur is noted. Soft tissues are unremarkable. IMPRESSION: No acute finding. Midfoot osteoarthritis appears worst at the first TMT joint. Plantar calcaneal spur. Electronically Signed   By: Inge Rise M.D.   On: 01/09/2017 16:35   Mm Diag Breast Tomo Bilateral  Result Date: 01/09/2017 CLINICAL DATA:  Patient with bilateral breast fullness. EXAM: 2D DIGITAL DIAGNOSTIC BILATERAL MAMMOGRAM WITH CAD AND ADJUNCT TOMO COMPARISON:  Previous exam(s). ACR Breast Density Category c: The breast tissue is heterogeneously dense, which may obscure small masses. FINDINGS: No concerning masses, calcifications or nonsurgical distortion identified within either breast. Mammographic images were processed with CAD. IMPRESSION: No mammographic evidence for malignancy. RECOMMENDATION: Screening mammogram in one year.(Code:SM-B-01Y) I have discussed the findings and recommendations with the patient. Results were also provided in writing at the conclusion of the visit. If applicable, a reminder letter will be sent to the patient regarding the next appointment. BI-RADS CATEGORY  1: Negative. Electronically Signed   By: Lovey Newcomer M.D.   On: 01/09/2017 14:18    EKG:  Sinus 64  14/08/41   Assessment and Plan:  Hypertensive urgency  The patient had an episode of hypertensive urgency. The cause of her labile blood pressure is not clear. No arrhythmias were identified during monitoring post hospitalization nor on arrival of EMS when she was still symptomatic. I suspect the sinus tachycardia was secondary to a adrenaline surge the source of which is also not clear. This regard her plasma metanephrines were normal. These are fairly sensitive, obtained in the context of an event.  I've given her a  prescription for hydralazine 25 mg to take as  needed every 6 hours for blood pressure greater than 170. We will see her again as needed.     Virl Axe

## 2017-01-20 DIAGNOSIS — M7661 Achilles tendinitis, right leg: Secondary | ICD-10-CM | POA: Diagnosis not present

## 2017-01-24 ENCOUNTER — Other Ambulatory Visit: Payer: Medicare Other

## 2017-01-25 ENCOUNTER — Encounter: Payer: Self-pay | Admitting: Internal Medicine

## 2017-01-25 ENCOUNTER — Ambulatory Visit (INDEPENDENT_AMBULATORY_CARE_PROVIDER_SITE_OTHER): Payer: Medicare Other | Admitting: Internal Medicine

## 2017-01-25 DIAGNOSIS — K580 Irritable bowel syndrome with diarrhea: Secondary | ICD-10-CM

## 2017-01-25 DIAGNOSIS — M810 Age-related osteoporosis without current pathological fracture: Secondary | ICD-10-CM

## 2017-01-25 DIAGNOSIS — C5700 Malignant neoplasm of unspecified fallopian tube: Secondary | ICD-10-CM | POA: Diagnosis not present

## 2017-01-25 DIAGNOSIS — R55 Syncope and collapse: Secondary | ICD-10-CM

## 2017-01-25 DIAGNOSIS — I10 Essential (primary) hypertension: Secondary | ICD-10-CM | POA: Diagnosis not present

## 2017-01-25 DIAGNOSIS — R739 Hyperglycemia, unspecified: Secondary | ICD-10-CM

## 2017-01-25 DIAGNOSIS — E559 Vitamin D deficiency, unspecified: Secondary | ICD-10-CM | POA: Diagnosis not present

## 2017-01-25 DIAGNOSIS — E538 Deficiency of other specified B group vitamins: Secondary | ICD-10-CM | POA: Diagnosis not present

## 2017-01-25 MED ORDER — ZOSTER VAC RECOMB ADJUVANTED 50 MCG/0.5ML IM SUSR: 0.5000 mL | Freq: Once | INTRAMUSCULAR | 1 refills | Status: AC

## 2017-01-25 MED ORDER — DENOSUMAB 60 MG/ML ~~LOC~~ SOLN
60.0000 mg | Freq: Once | SUBCUTANEOUS | Status: AC
  Administered 2017-01-25: 60 mg via SUBCUTANEOUS

## 2017-01-25 NOTE — Assessment & Plan Note (Signed)
[place]

## 2017-01-25 NOTE — Addendum Note (Signed)
Addended by: Karren Cobble on: 01/25/2017 02:16 PM   Modules accepted: Orders

## 2017-01-25 NOTE — Assessment & Plan Note (Signed)
CT abd offered - the pt wants to do the colonoscopy first

## 2017-01-25 NOTE — Assessment & Plan Note (Signed)
F/u w/Dr Kumar 

## 2017-01-25 NOTE — Assessment & Plan Note (Signed)
On Vit D 

## 2017-01-25 NOTE — Progress Notes (Signed)
Subjective:  Patient ID: [name], female    DOB: 1946-02-26  Age: 71 y.o. MRN: [id number]  CC: No chief complaint on file.   HPI [name] presents for syncope, achilles tendonitis on the R, anxiety f/u  Outpatient Medications Prior to Visit  Medication Sig Dispense Refill  . aspirin 81 MG EC tablet Take 81 mg by mouth daily.      . bimatoprost (LUMIGAN) 0.03 % ophthalmic solution Place 1 drop into both eyes at bedtime.      . Cholecalciferol (VITAMIN D3 SUPER STRENGTH) 2000 UNITS TABS Take 2 tablets (4,000 Units total) by mouth every morning. 100 each 3  . clonazePAM (KLONOPIN) 0.25 MG disintegrating tablet Take 1-2 tablets (0.25-0.5 mg total) by mouth 2 (two) times daily. 60 tablet 1  . denosumab (PROLIA) 60 MG/ML SOLN injection Inject 60 mg into the skin every 6 (six) months. Administer in upper arm, thigh, or abdomen    . diphenoxylate-atropine (LOMOTIL) 2.5-0.025 MG tablet Take 1 tablet by mouth 4 (four) times daily as needed for diarrhea or loose stools. (Patient taking differently: Take 1 tablet by mouth 2 (two) times daily. ) 60 tablet 3  . hydrALAZINE (APRESOLINE) 25 MG tablet Take one tablet (25 mg) by mouth every 6 hours as needed for elevated blood pressure 30 tablet 3  . HYDROcodone-acetaminophen (NORCO/VICODIN) 5-325 MG tablet Take one by mouth at bedtime as needed for pain 10 tablet 0  . Magnesium Oxide 400 MG CAPS Take 1 capsule (400 mg total) by mouth 2 (two) times daily.  0  . NASCOBAL 500 MCG/0.1ML SOLN USE ONE SPRAY NASALLY ONCE  A WEEK 3 Bottle 1  . RABEprazole (ACIPHEX) 20 MG tablet Take 1 tablet (20 mg total) by mouth daily. 90 tablet 3  . timolol (TIMOPTIC-XR) 0.5 % ophthalmic gel-forming Place 1 drop into both eyes daily.     . potassium chloride (K-DUR,KLOR-CON) 10 MEQ tablet Take 1 tablet (10 mEq total) by mouth 2 (two) times daily. (Patient taking differently: Take 10 mEq by mouth every 3 (three) days. ) 30 tablet 11  . colestipol (COLESTID) 1 g  tablet Take 2 tablets (2 g total) by mouth 2 (two) times daily. (Patient not taking: Reported on 01/25/2017) 120 tablet 3   No facility-administered medications prior to visit.     ROS Review of Systems  Constitutional: Negative for activity change, appetite change, chills, fatigue and unexpected weight change.  HENT: Negative for congestion, mouth sores and sinus pressure.   Eyes: Negative for visual disturbance.  Respiratory: Negative for cough and chest tightness.   Gastrointestinal: Negative for abdominal pain and nausea.  Genitourinary: Negative for difficulty urinating, frequency and vaginal pain.  Musculoskeletal: Negative for back pain and gait problem.  Skin: Negative for pallor and rash.  Neurological: Negative for dizziness, tremors, weakness, numbness and headaches.  Psychiatric/Behavioral: Negative for confusion and sleep disturbance.    Objective:  BP 130/72 (BP Location: Left Arm, Patient Position: Sitting, Cuff Size: Normal)   Pulse 81   Temp 97.7 F (36.5 C) (Oral)   Ht 5\' 3"  (1.6 m)   Wt 190 lb (86.2 kg)   SpO2 98%   BMI 33.66 kg/m   BP Readings from Last 3 Encounters:  01/25/17 130/72  01/19/17 140/90  01/09/17 117/73    Wt Readings from Last 3 Encounters:  01/25/17 190 lb (86.2 kg)  01/19/17 192 lb (87.1 kg)  12/19/16 187 lb (84.8 kg)    Physical Exam  Constitutional:  She appears well-developed. No distress.  HENT:  Head: Normocephalic.  Right Ear: External ear normal.  Left Ear: External ear normal.  Nose: Nose normal.  Mouth/Throat: Oropharynx is clear and moist.  Eyes: Conjunctivae are normal. Pupils are equal, round, and reactive to light. Right eye exhibits no discharge. Left eye exhibits no discharge.  Neck: Normal range of motion. Neck supple. No JVD present. No tracheal deviation present. No thyromegaly present.  Cardiovascular: Normal rate, regular rhythm and normal heart sounds.   Pulmonary/Chest: No stridor. No respiratory distress.  She has no wheezes.  Abdominal: Soft. Bowel sounds are normal. She exhibits no distension and no mass. There is no tenderness. There is no rebound and no guarding.  Musculoskeletal: She exhibits no edema or tenderness.  Lymphadenopathy:    She has no cervical adenopathy.  Neurological: She displays normal reflexes. No cranial nerve deficit. She exhibits normal muscle tone. Coordination normal.  Skin: No rash noted. No erythema.  Psychiatric: She has a normal mood and affect. Her behavior is normal. Judgment and thought content normal.  obese  Lab Results  Component Value Date   WBC 7.0 12/13/2016   HGB 12.7 12/13/2016   HCT 37.1 12/13/2016   PLT 244.0 12/13/2016   GLUCOSE 111 (H) 12/13/2016   CHOL 170 09/26/2016   TRIG 148.0 09/26/2016   HDL 40.10 09/26/2016   LDLCALC 101 (H) 09/26/2016   ALT 19 11/29/2016   AST 19 11/29/2016   NA 138 12/13/2016   K 4.3 12/13/2016   CL 105 12/13/2016   CREATININE 0.83 12/13/2016   BUN 16 12/13/2016   CO2 27 12/13/2016   TSH 1.530 11/28/2016   HGBA1C 6.0 09/26/2016    Dg Foot Complete Right  Result Date: 01/09/2017 CLINICAL DATA:  Right foot and heel pain for 2 days. No known injury. EXAM: RIGHT FOOT COMPLETE - 3+ VIEW COMPARISON:  None. FINDINGS: No acute bony or joint abnormality is identified. Midfoot osteoarthritis appears worst at the first tarsometatarsal joint. Small plantar calcaneal spur is noted. Soft tissues are unremarkable. IMPRESSION: No acute finding. Midfoot osteoarthritis appears worst at the first TMT joint. Plantar calcaneal spur. Electronically Signed   By: Inge Rise M.D.   On: 01/09/2017 16:35   Mm Diag Breast Tomo Bilateral  Result Date: 01/09/2017 CLINICAL DATA:  Patient with bilateral breast fullness. EXAM: 2D DIGITAL DIAGNOSTIC BILATERAL MAMMOGRAM WITH CAD AND ADJUNCT TOMO COMPARISON:  Previous exam(s). ACR Breast Density Category c: The breast tissue is heterogeneously dense, which may obscure small masses.  FINDINGS: No concerning masses, calcifications or nonsurgical distortion identified within either breast. Mammographic images were processed with CAD. IMPRESSION: No mammographic evidence for malignancy. RECOMMENDATION: Screening mammogram in one year.(Code:SM-B-01Y) I have discussed the findings and recommendations with the patient. Results were also provided in writing at the conclusion of the visit. If applicable, a reminder letter will be sent to the patient regarding the next appointment. BI-RADS CATEGORY  1: Negative. Electronically Signed   By: Lovey Newcomer M.D.   On: 01/09/2017 14:18    Assessment & Plan:   There are no diagnoses linked to this encounter. I have discontinued Ms. Eduardo potassium chloride. I am also having her maintain her aspirin, bimatoprost, timolol, denosumab, Cholecalciferol, colestipol, RABEprazole, diphenoxylate-atropine, NASCOBAL, clonazePAM, HYDROcodone-acetaminophen, Magnesium Oxide, and hydrALAZINE.  No orders of the defined types were placed in this encounter.    Follow-up: No Follow-up on file.  Walker Kehr, MD

## 2017-01-25 NOTE — Assessment & Plan Note (Signed)
On B12 

## 2017-01-25 NOTE — Assessment & Plan Note (Addendum)
Hydralazine prn per Dr Caryl Comes

## 2017-01-25 NOTE — Assessment & Plan Note (Addendum)
Dr Lequita Halt Off Clonidine On a heart monitor now ??panic attacks - Clonazepam wafer prn GI w/up is pending w/Dr Henrene Pastor

## 2017-01-25 NOTE — Assessment & Plan Note (Addendum)
F/u w/Dr Henrene Pastor Lomotil prn Welchol, Viberzi, Xifaxan, Creon did not work

## 2017-01-26 ENCOUNTER — Ambulatory Visit: Payer: Medicare Other | Admitting: Physician Assistant

## 2017-01-27 ENCOUNTER — Ambulatory Visit: Payer: Medicare Other | Admitting: Endocrinology

## 2017-02-10 ENCOUNTER — Other Ambulatory Visit: Payer: Medicare Other

## 2017-02-15 ENCOUNTER — Ambulatory Visit: Payer: Medicare Other | Admitting: Endocrinology

## 2017-02-24 ENCOUNTER — Other Ambulatory Visit (INDEPENDENT_AMBULATORY_CARE_PROVIDER_SITE_OTHER): Payer: Medicare Other

## 2017-02-24 DIAGNOSIS — R7303 Prediabetes: Secondary | ICD-10-CM | POA: Diagnosis not present

## 2017-02-24 LAB — COMPREHENSIVE METABOLIC PANEL
ALBUMIN: 4.3 g/dL (ref 3.5–5.2)
ALT: 19 U/L (ref 0–35)
AST: 18 U/L (ref 0–37)
Alkaline Phosphatase: 65 U/L (ref 39–117)
BILIRUBIN TOTAL: 0.5 mg/dL (ref 0.2–1.2)
BUN: 14 mg/dL (ref 6–23)
CALCIUM: 9.4 mg/dL (ref 8.4–10.5)
CHLORIDE: 105 meq/L (ref 96–112)
CO2: 28 mEq/L (ref 19–32)
CREATININE: 0.78 mg/dL (ref 0.40–1.20)
GFR: 77.34 mL/min (ref >60.00)
Glucose, Bld: 122 mg/dL — ABNORMAL HIGH (ref 70–99)
Potassium: 3.9 mEq/L (ref 3.5–5.1)
SODIUM: 142 meq/L (ref 135–145)
Total Protein: 7.2 g/dL (ref 6.0–8.3)

## 2017-02-24 LAB — HEMOGLOBIN A1C: Hgb A1c MFr Bld: 6 % (ref 4.6–6.5)

## 2017-02-27 NOTE — Progress Notes (Signed)
Patient ID: [name], female   DOB: [birth year], 71 y.o.   MRN: [id number]           Reason for Appointment: Follow up of prediabetes  Referring physician: [name]  History of Present Illness:          She has prediabetes and also was told to have background retinopathy  On her periodic general checkups she has had an A1c done regularly and these have been upper normal around 6.0  She was evaluated with a glucose tolerance test in 10/2015 which showed the following: Fasting glucose 122, one-hour glucose 202 hour glucose 147  She was referred to the dietitian for meal planning in 3/17  Again she has not been able to lose any weight Although she has information from the nutritionist she says she does not follow it and is not paying attention to her type of meals or carbohydrates Still not motivated to exercise but also has had intercurrent medical problems recently She was asked to join the diabetes prevention program at the [facility] last time but she did not do so  However her A1c is quite stable at 6% Fasting glucose is 122, previously 114  Weight history:  Wt Readings from Last 3 Encounters:  02/28/17 188 lb 12.8 oz (85.6 kg)  01/25/17 190 lb (86.2 kg)  01/19/17 192 lb (87.1 kg)    Glycemic levels:   Lab Results  Component Value Date   HGBA1C 6.0 02/24/2017   HGBA1C 6.0 09/26/2016   HGBA1C 6.1 04/26/2016   Lab Results  Component Value Date   LDLCALC 101 (H) 09/26/2016   CREATININE 0.78 02/24/2017       Allergies as of 02/28/2017      Reactions   Actonel [risedronate Sodium]    Upset stomach   Boniva [ibandronate Sodium]    cramp   Calcium Channel Blockers    Upset stomach   Compazine    Daughter reacts to compazine/pt does not want to take   Lyrica [pregabalin]    Dizzy      Medication List       Accurate as of 02/28/17  8:38 AM. Always use your most recent med list.          aspirin 81 MG EC tablet Take 81 mg by mouth daily.     bimatoprost 0.03 % ophthalmic solution Commonly known as:  LUMIGAN Place 1 drop into both eyes at bedtime.   Cholecalciferol 2000 units Tabs Commonly known as:  VITAMIN D3 SUPER STRENGTH Take 2 tablets (4,000 Units total) by mouth every morning.   clonazePAM 0.25 MG disintegrating tablet Commonly known as:  KLONOPIN Take 1-2 tablets (0.25-0.5 mg total) by mouth 2 (two) times daily.   colestipol 1 g tablet Commonly known as:  COLESTID Take 2 tablets (2 g total) by mouth 2 (two) times daily.   denosumab 60 MG/ML Soln injection Commonly known as:  PROLIA Inject 60 mg into the skin every 6 (six) months. Administer in upper arm, thigh, or abdomen   diphenoxylate-atropine 2.5-0.025 MG tablet Commonly known as:  LOMOTIL Take 1 tablet by mouth 4 (four) times daily as needed for diarrhea or loose stools.   hydrALAZINE 25 MG tablet Commonly known as:  APRESOLINE Take one tablet (25 mg) by mouth every 6 hours as needed for elevated blood pressure   Magnesium Oxide 400 MG Caps Take 1 capsule (400 mg total) by mouth 2 (two) times daily.   NASCOBAL 500 MCG/0.1ML Soln Generic drug:  Cyanocobalamin USE ONE SPRAY NASALLY ONCE  A WEEK   RABEprazole 20 MG tablet Commonly known as:  ACIPHEX Take 1 tablet (20 mg total) by mouth daily.   timolol 0.5 % ophthalmic gel-forming Commonly known as:  TIMOPTIC-XR Place 1 drop into both eyes daily.       Allergies:  Allergies  Allergen Reactions  . Actonel [Risedronate Sodium]     Upset stomach  . Boniva [Ibandronate Sodium]     cramp  . Calcium Channel Blockers     Upset stomach  . Compazine     Daughter reacts to compazine/pt does not want to take  . Lyrica [Pregabalin]     Dizzy     Past Medical History:  Diagnosis Date  . Adenomatous colon polyp 04/08/2011  . Anemia   . Anxiety   . GERD (gastroesophageal reflux disease)   . Glaucoma (increased eye pressure)   . Heart murmur   . HTN (hypertension)   . IBS (irritable bowel  syndrome)   . LBP (low back pain)   . Menopause   . Osteoporosis   . Ovarian cancer ([place]) 09/2008   Dr Gwyneth Revels  . Pancreatic cyst   . Vitamin B12 deficiency   . Vitamin D deficiency     Past Surgical History:  Procedure Laterality Date  . ABDOMINAL HYSTERECTOMY    . APPENDECTOMY    . [place]  2001  . Ovarian Cancer [place]  09/2008    Family History  Problem Relation Age of Onset  . Alzheimer's disease Mother 83  . Other Mother 71       TAH for excessive bleeding  . Lung cancer Father        smoker; metastasis to stomach and other areas  . Heart attack Maternal Uncle   . Other Paternal Aunt        stomach issues  . Heart Problems Paternal Uncle   . Other Maternal Grandmother        stomach issues; +hysterectomy  . Heart attack Maternal Grandfather   . Infertility Daughter   . Stomach cancer Cousin        dx. mid-60s  . Other Cousin        stomach issues  . Leukemia Cousin 18  . Stomach cancer Other   . Cancer Cousin        unknown type  . Other Cousin        stomach issues  . Heart Problems Maternal Uncle   . Diabetes Paternal Aunt   . Heart Problems Paternal Uncle   . Emphysema Paternal Uncle        work exposure  . Breast cancer Cousin        dx. late 60s-early 70s  . Colon cancer Neg Hx   . Rectal cancer Neg Hx   . Esophageal cancer Neg Hx     Social History:  reports that she has never smoked. She has never used smokeless tobacco. She reports that she does not drink alcohol or use drugs.    Review of Systems   On Prolia from PCP For osteopenia   Lipid history: She has not been on any statin drugs, is taking Colestid for GI problems    Lab Results  Component Value Date   CHOL 170 09/26/2016   HDL 40.10 09/26/2016   LDLCALC 101 (H) 09/26/2016   TRIG 148.0 09/26/2016   CHOLHDL 4 09/26/2016         RETINOPATHY: She was told by her  ophthalmologistIn 1/18 that her retinopathy changes are relatively better    Review of Systems    Gastrointestinal: Positive for diarrhea.      LABS:  Lab on 02/24/2017  Component Date Value Ref Range Status  . Hgb A1c MFr Bld 02/24/2017 6.0  4.6 - 6.5 % Final   Glycemic Control Guidelines for People with Diabetes:Non Diabetic:  <6%Goal of Therapy: <7%Additional Action Suggested:  >8%   . Sodium 02/24/2017 142  135 - 145 mEq/L Final  . Potassium 02/24/2017 3.9  3.5 - 5.1 mEq/L Final  . Chloride 02/24/2017 105  96 - 112 mEq/L Final  . CO2 02/24/2017 28  19 - 32 mEq/L Final  . Glucose, Bld 02/24/2017 122* 70 - 99 mg/dL Final  . BUN 02/24/2017 14  6 - 23 mg/dL Final  . Creatinine, Ser 02/24/2017 0.78  0.40 - 1.20 mg/dL Final  . Total Bilirubin 02/24/2017 0.5  0.2 - 1.2 mg/dL Final  . Alkaline Phosphatase 02/24/2017 65  39 - 117 U/L Final  . AST 02/24/2017 18  0 - 37 U/L Final  . ALT 02/24/2017 19  0 - 35 U/L Final  . Total Protein 02/24/2017 7.2  6.0 - 8.3 g/dL Final  . Albumin 02/24/2017 4.3  3.5 - 5.2 g/dL Final  . Calcium 02/24/2017 9.4  8.4 - 10.5 mg/dL Final  . GFR 02/24/2017 77.34  >60.00 mL/min Final    Physical Examination:  BP (!) 144/92   Pulse 78   Ht 5\' 3"  (1.6 m)   Wt 188 lb 12.8 oz (85.6 kg)   SpO2 97%   BMI 33.44 kg/m         ASSESSMENT/PLAN:  PREDIABETES: Her A1c has been around 6% Been consistently stable now However fasting glucose is 122, relatively higher and similar to when she was first evaluated with GTT She has not lost weight  Have discussed that she has had information given to her by the nutritionist and she does need to start following this for weight loss and low glycemic index foods Currently she is not able to exercise but she thinks she can start walking when she feels better with her husband  Although she is asking about blood sugar monitoring because of her feeling lightheaded at times discussed that her blood sugars are not causing her to have symptoms and if she wants she can use an OTC monitor from [facility]  Not a candidate for  metformin because of continued problems with diarrhea  HYPERTENSION: Continue follow-up with PCP  She is asking about potassium and magnesium supplements and not clear if she is having potassium depletion because of her tendency to diarrhea, currently potassium normal  She will follow-up in 6 months again   [facility] 02/28/2017, 8:38 AM   Note: This office note was prepared with Estate agent. Any transcriptional errors that result from this process are unintentional.

## 2017-02-28 ENCOUNTER — Encounter: Payer: Self-pay | Admitting: Endocrinology

## 2017-02-28 ENCOUNTER — Ambulatory Visit (INDEPENDENT_AMBULATORY_CARE_PROVIDER_SITE_OTHER): Payer: Medicare Other | Admitting: Endocrinology

## 2017-02-28 VITALS — BP 144/92 | HR 78 | Ht 63.0 in | Wt 188.8 lb

## 2017-02-28 DIAGNOSIS — R7301 Impaired fasting glucose: Secondary | ICD-10-CM | POA: Diagnosis not present

## 2017-02-28 DIAGNOSIS — I10 Essential (primary) hypertension: Secondary | ICD-10-CM

## 2017-02-28 NOTE — Patient Instructions (Signed)
[facility]

## 2017-03-20 ENCOUNTER — Ambulatory Visit (AMBULATORY_SURGERY_CENTER): Payer: Self-pay

## 2017-03-20 VITALS — Ht 63.0 in | Wt 191.6 lb

## 2017-03-20 DIAGNOSIS — Z86010 Personal history of colonic polyps: Secondary | ICD-10-CM

## 2017-03-20 MED ORDER — NA SULFATE-K SULFATE-MG SULF 17.5-3.13-1.6 GM/177ML PO SOLN: 1.0000 | Freq: Once | ORAL | 0 refills | Status: AC

## 2017-03-20 NOTE — Progress Notes (Signed)
Denies allergies to eggs or soy products. Denies complication of anesthesia or sedation. Denies use of weight loss medication. Denies use of O2.   Emmi instructions declined.  

## 2017-03-22 DIAGNOSIS — D2271 Melanocytic nevi of right lower limb, including hip: Secondary | ICD-10-CM | POA: Diagnosis not present

## 2017-03-22 DIAGNOSIS — D692 Other nonthrombocytopenic purpura: Secondary | ICD-10-CM | POA: Diagnosis not present

## 2017-03-22 DIAGNOSIS — D1801 Hemangioma of skin and subcutaneous tissue: Secondary | ICD-10-CM | POA: Diagnosis not present

## 2017-03-22 DIAGNOSIS — Z85828 Personal history of other malignant neoplasm of skin: Secondary | ICD-10-CM | POA: Diagnosis not present

## 2017-03-22 DIAGNOSIS — L218 Other seborrheic dermatitis: Secondary | ICD-10-CM | POA: Diagnosis not present

## 2017-03-22 DIAGNOSIS — L821 Other seborrheic keratosis: Secondary | ICD-10-CM | POA: Diagnosis not present

## 2017-03-22 DIAGNOSIS — L814 Other melanin hyperpigmentation: Secondary | ICD-10-CM | POA: Diagnosis not present

## 2017-03-22 DIAGNOSIS — D225 Melanocytic nevi of trunk: Secondary | ICD-10-CM | POA: Diagnosis not present

## 2017-03-30 ENCOUNTER — Other Ambulatory Visit: Payer: Medicare Other

## 2017-03-30 ENCOUNTER — Ambulatory Visit: Payer: Medicare Other | Admitting: Hematology and Oncology

## 2017-03-31 DIAGNOSIS — H40013 Open angle with borderline findings, low risk, bilateral: Secondary | ICD-10-CM | POA: Diagnosis not present

## 2017-04-03 ENCOUNTER — Encounter: Payer: Self-pay | Admitting: Internal Medicine

## 2017-04-03 ENCOUNTER — Ambulatory Visit (AMBULATORY_SURGERY_CENTER): Payer: Medicare Other | Admitting: Internal Medicine

## 2017-04-03 VITALS — BP 157/49 | HR 57 | Temp 98.9°F | Resp 16 | Ht 63.0 in | Wt 191.0 lb

## 2017-04-03 DIAGNOSIS — D123 Benign neoplasm of transverse colon: Secondary | ICD-10-CM

## 2017-04-03 DIAGNOSIS — D122 Benign neoplasm of ascending colon: Secondary | ICD-10-CM | POA: Diagnosis not present

## 2017-04-03 DIAGNOSIS — Z86010 Personal history of colonic polyps: Secondary | ICD-10-CM | POA: Diagnosis not present

## 2017-04-03 DIAGNOSIS — Z1211 Encounter for screening for malignant neoplasm of colon: Secondary | ICD-10-CM | POA: Diagnosis not present

## 2017-04-03 MED ORDER — SODIUM CHLORIDE 0.9 % IV SOLN: 500.0000 mL | INTRAVENOUS | Status: DC

## 2017-04-03 NOTE — Progress Notes (Signed)
Pt's states no medical or surgical changes since previsit or office visit. 

## 2017-04-03 NOTE — Op Note (Signed)
[place] Patient Name: [name] Procedure Date: 04/03/2017 1:56 PM MRN: [id number] Endoscopist: Docia Chuck. Henrene Pastor , MD Age: 71 Referring MD:  Date of Birth: 1946-08-15 Gender: Female Account #: [id number] Procedure:                Colonoscopy, with cold snare polypectomy x 5 Indications:              High risk colon cancer surveillance: Personal                            history of adenoma (10 mm or greater in size), High                            risk colon cancer surveillance: Personal history of                            adenoma with villous component, High risk colon                            cancer surveillance: Personal history of multiple                            (3 or more) adenomas. Previous examinations 2001                            (Dr. Velora Heckler), 2005, 2007, 2012 (Dr. Olevia Perches). Last                            exam April 2017 with 15 mm sessile transverse colon                            polyp removed piecemeal with distal tattooing Medicines:                Monitored Anesthesia Care Procedure:                Pre-Anesthesia Assessment:                           - Prior to the procedure, a History and Physical                            was performed, and patient medications and                            allergies were reviewed. The patient's tolerance of                            previous anesthesia was also reviewed. The risks                            and benefits of the procedure and the sedation                            options and risks were discussed with the patient.  All questions were answered, and informed consent                            was obtained. Prior Anticoagulants: The patient has                            taken no previous anticoagulant or antiplatelet                            agents. ASA Grade Assessment: II - A patient with                            mild systemic disease. After reviewing the  risks                            and benefits, the patient was deemed in                            satisfactory condition to undergo the procedure.                           After obtaining informed consent, the colonoscope                            was passed under direct vision. Throughout the                            procedure, the patient's blood pressure, pulse, and                            oxygen saturations were monitored continuously. The                            Colonoscope was introduced through the anus and                            advanced to the the cecum, identified by                            appendiceal orifice and ileocecal valve. The                            ileocecal valve, appendiceal orifice, and rectum                            were photographed. The quality of the bowel                            preparation was excellent. The colonoscopy was                            performed without difficulty. The patient tolerated  the procedure well. The bowel preparation used was                            SUPREP. Scope In: 1:59:46 PM Scope Out: 2:14:54 PM Scope Withdrawal Time: 0 hours 12 minutes 13 seconds  Total Procedure Duration: 0 hours 15 minutes 8 seconds  Findings:                 A 3 mm polyp was found in the proximal transverse                            colon at the prior polypectomy site. The polyp was                            sessile. The polyp was removed with a cold snare.                            Resection and retrieval were complete.                           Four polyps were found in the ascending colon. The                            polyps were 2 to 4 mm in size. These polyps were                            removed with a cold snare. Resection and retrieval                            were complete.                           Internal hemorrhoids were found during retroflexion.                           The  exam was otherwise without abnormality on                            direct and retroflexion views. Complications:            No immediate complications. Estimated blood loss:                            None. Estimated Blood Loss:     Estimated blood loss: none. Impression:               - One 3 mm polyp in the proximal transverse colon                            at the prior polypectomy site, removed with a cold                            snare. Resected and retrieved.                           -  Four 2 to 4 mm polyps in the ascending colon,                            removed with a cold snare. Resected and retrieved.                           - Internal hemorrhoids.                           - The examination was otherwise normal on direct                            and retroflexion views. Recommendation:           - Repeat colonoscopy in 3 years for surveillance.                           - Patient has a contact number available for                            emergencies. The signs and symptoms of potential                            delayed complications were discussed with the                            patient. Return to normal activities tomorrow.                            Written discharge instructions were provided to the                            patient.                           - Resume previous diet.                           - Continue present medications.                           - Await pathology results. Docia Chuck. Henrene Pastor, MD 04/03/2017 2:21:51 PM This report has been signed electronically.

## 2017-04-03 NOTE — Progress Notes (Signed)
To PACU VSS. Report to [email or web address] 

## 2017-04-03 NOTE — Progress Notes (Signed)
Called to room to assist during endoscopic procedure.  Patient ID and intended procedure confirmed with present staff. Received instructions for my participation in the procedure from the performing physician.  

## 2017-04-03 NOTE — Patient Instructions (Signed)
Handouts given : Polyps and Hemorrhoids    YOU HAD AN ENDOSCOPIC PROCEDURE TODAY AT THE [facility]:   Refer to the procedure report that was given to you for any specific questions about what was found during the examination.  If the procedure report does not answer your questions, please call your gastroenterologist to clarify.  If you requested that your care partner not be given the details of your procedure findings, then the procedure report has been included in a sealed envelope for you to review at your convenience later.  YOU SHOULD EXPECT: Some feelings of bloating in the abdomen. Passage of more gas than usual.  Walking can help get rid of the air that was put into your GI tract during the procedure and reduce the bloating. If you had a lower endoscopy (such as a colonoscopy or flexible sigmoidoscopy) you may notice spotting of blood in your stool or on the toilet paper. If you underwent a bowel prep for your procedure, you may not have a normal bowel movement for a few days.  Please Note:  You might notice some irritation and congestion in your nose or some drainage.  This is from the oxygen used during your procedure.  There is no need for concern and it should clear up in a day or so.  SYMPTOMS TO REPORT IMMEDIATELY:   Following lower endoscopy (colonoscopy or flexible sigmoidoscopy):  Excessive amounts of blood in the stool  Significant tenderness or worsening of abdominal pains  Swelling of the abdomen that is new, acute  Fever of 100F or higher  For urgent or emergent issues, a gastroenterologist can be reached at any hour by calling [phone].   DIET:  We do recommend a small meal at first, but then you may proceed to your regular diet.  Drink plenty of fluids but you should avoid alcoholic beverages for 24 hours.  ACTIVITY:  You should plan to take it easy for the rest of today and you should NOT DRIVE or use heavy machinery until tomorrow (because of the  sedation medicines used during the test).    FOLLOW UP: Our staff will call the number listed on your records the next business day following your procedure to check on you and address any questions or concerns that you may have regarding the information given to you following your procedure. If we do not reach you, we will leave a message.  However, if you are feeling well and you are not experiencing any problems, there is no need to return our call.  We will assume that you have returned to your regular daily activities without incident.  If any biopsies were taken you will be contacted by phone or by letter within the next 1-3 weeks.  Please call us at [phone] if you have not heard about the biopsies in 3 weeks.    SIGNATURES/CONFIDENTIALITY: You and/or your care partner have signed paperwork which will be entered into your electronic medical record.  These signatures attest to the fact that that the information above on your After Visit Summary has been reviewed and is understood.  Full responsibility of the confidentiality of this discharge information lies with you and/or your care-partner. 

## 2017-04-04 ENCOUNTER — Telehealth: Payer: Self-pay | Admitting: *Deleted

## 2017-04-04 NOTE — Telephone Encounter (Signed)
  Follow up Call-  Call back number 04/03/2017 12/23/2015  Post procedure Call Back phone  # [phone]  Permission to leave phone message Yes Yes  Some recent data might be hidden     Patient questions:  Do you have a fever, pain , or abdominal swelling? No. Pain Score  0 *  Have you tolerated food without any problems? Yes.    Have you been able to return to your normal activities? Yes.    Do you have any questions about your discharge instructions: Diet   No. Medications  No. Follow up visit  No.  Do you have questions or concerns about your Care? No.  Actions: * If pain score is 4 or above: No action needed, pain <4.

## 2017-04-06 ENCOUNTER — Encounter: Payer: Self-pay | Admitting: Internal Medicine

## 2017-04-11 ENCOUNTER — Ambulatory Visit (INDEPENDENT_AMBULATORY_CARE_PROVIDER_SITE_OTHER): Payer: Medicare Other | Admitting: Gastroenterology

## 2017-04-11 ENCOUNTER — Other Ambulatory Visit (INDEPENDENT_AMBULATORY_CARE_PROVIDER_SITE_OTHER): Payer: Medicare Other

## 2017-04-11 ENCOUNTER — Ambulatory Visit (INDEPENDENT_AMBULATORY_CARE_PROVIDER_SITE_OTHER)
Admission: RE | Admit: 2017-04-11 | Discharge: 2017-04-11 | Disposition: A | Discharge status: Home / Self Care | Payer: Medicare Other | Source: Ambulatory Visit | Attending: Gastroenterology | Admitting: Gastroenterology

## 2017-04-11 ENCOUNTER — Telehealth: Payer: Self-pay | Admitting: Internal Medicine

## 2017-04-11 ENCOUNTER — Encounter: Payer: Self-pay | Admitting: Gastroenterology

## 2017-04-11 ENCOUNTER — Other Ambulatory Visit: Payer: Self-pay | Admitting: *Deleted

## 2017-04-11 ENCOUNTER — Telehealth: Payer: Self-pay | Admitting: *Deleted

## 2017-04-11 VITALS — BP 150/80 | HR 72 | Ht 63.0 in | Wt 188.8 lb

## 2017-04-11 DIAGNOSIS — G8929 Other chronic pain: Secondary | ICD-10-CM

## 2017-04-11 DIAGNOSIS — R1013 Epigastric pain: Secondary | ICD-10-CM

## 2017-04-11 DIAGNOSIS — R1010 Upper abdominal pain, unspecified: Secondary | ICD-10-CM

## 2017-04-11 DIAGNOSIS — R1011 Right upper quadrant pain: Secondary | ICD-10-CM

## 2017-04-11 DIAGNOSIS — R748 Abnormal levels of other serum enzymes: Secondary | ICD-10-CM

## 2017-04-11 LAB — CBC WITH DIFFERENTIAL/PLATELET
BASOS ABS: 0 10*3/uL (ref 0.0–0.1)
Basophils Relative: 0.7 % (ref 0.0–3.0)
EOS ABS: 0.1 10*3/uL (ref 0.0–0.7)
Eosinophils Relative: 1.3 % (ref 0.0–5.0)
HCT: 36.3 % (ref 36.0–46.0)
Hemoglobin: 12.1 g/dL (ref 12.0–15.0)
LYMPHS PCT: 22.1 % (ref 12.0–46.0)
Lymphs Abs: 1.4 10*3/uL (ref 0.7–4.0)
MCHC: 33.3 g/dL (ref 30.0–36.0)
MCV: 86.6 fl (ref 78.0–100.0)
MONO ABS: 0.4 10*3/uL (ref 0.1–1.0)
Monocytes Relative: 6.7 % (ref 3.0–12.0)
NEUTROS ABS: 4.2 10*3/uL (ref 1.4–7.7)
NEUTROS PCT: 69.2 % (ref 43.0–77.0)
PLATELETS: 225 10*3/uL (ref 150.0–400.0)
RBC: 4.2 Mil/uL (ref 3.87–5.11)
RDW: 13.8 % (ref 11.5–15.5)
WBC: 6.1 10*3/uL (ref 4.0–10.5)

## 2017-04-11 LAB — COMPREHENSIVE METABOLIC PANEL
ALT: 283 U/L — AB (ref 0–35)
AST: 409 U/L — AB (ref 0–37)
Albumin: 4.3 g/dL (ref 3.5–5.2)
Alkaline Phosphatase: 177 U/L — ABNORMAL HIGH (ref 39–117)
BILIRUBIN TOTAL: 1.9 mg/dL — AB (ref 0.2–1.2)
BUN: 12 mg/dL (ref 6–23)
CHLORIDE: 103 meq/L (ref 96–112)
CO2: 29 meq/L (ref 19–32)
CREATININE: 0.71 mg/dL (ref 0.40–1.20)
Calcium: 9.4 mg/dL (ref 8.4–10.5)
GFR: 86.17 mL/min (ref >60.00)
GLUCOSE: 154 mg/dL — AB (ref 70–99)
Potassium: 3.3 mEq/L — ABNORMAL LOW (ref 3.5–5.1)
SODIUM: 140 meq/L (ref 135–145)
Total Protein: 7.7 g/dL (ref 6.0–8.3)

## 2017-04-11 LAB — LIPASE: Lipase: 28 U/L (ref 11.0–59.0)

## 2017-04-11 MED ORDER — HYOSCYAMINE SULFATE SL 0.125 MG SL SUBL: SUBLINGUAL_TABLET | SUBLINGUAL | 0 refills | Status: DC

## 2017-04-11 NOTE — Telephone Encounter (Signed)
Called the patient to advise her we got lab results back and her liver enzymes were elevated.  We ordered an [place] for her at [facility] at 11:00 am. She is to be NPO 6-8 hours prior to the test.  Advised the patient we will call her with results.

## 2017-04-11 NOTE — Patient Instructions (Signed)
Please go to the basement level to have your labs drawn and an abdominal x-ray in the radiology deparment also on  We have sent the following medications to your pharmacy for you to pick up at your convenience: the basement level. . [place]  1. Levsin SL

## 2017-04-11 NOTE — Telephone Encounter (Signed)
Pt states she is having pain right above her waist and it goes around to her right side under her ribs. Reports that area is tender to touch. Pt requesting to be seen. Pt scheduled to see Alonza Bogus PA today at 2pm, pt aware of appt.

## 2017-04-11 NOTE — Progress Notes (Signed)
04/11/2017 [name] [id number] 1946-03-20   HISTORY OF PRESENT ILLNESS:  This is a 71 year old female who is 8 days post colonoscopy by Dr. Henrene Pastor.  She presents with complaints of quite severe upper abdominal pain that has been intermittent for the past 3 days or so.  She also complains of dizziness, but this has been occurring since at least February and has been evaluated with CT scan of the head, cardiac workup, etc (I believe it is likely unrelated).  Has been passing gas, moving her bowels, and eating.  Denies SOB and chest pain.  No pain with deep breaths.   Past Medical History:  Diagnosis Date  . Adenomatous colon polyp 04/08/2011  . Anemia   . Anxiety   . Cataract   . GERD (gastroesophageal reflux disease)   . Glaucoma (increased eye pressure)   . Heart murmur   . HTN (hypertension)   . IBS (irritable bowel syndrome)   . LBP (low back pain)   . Menopause   . Osteoporosis   . Ovarian cancer ([place]) 09/2008   Dr Gwyneth Revels  . Pancreatic cyst   . Vitamin B12 deficiency   . Vitamin D deficiency    Past Surgical History:  Procedure Laterality Date  . ABDOMINAL HYSTERECTOMY    . APPENDECTOMY    . [place]  2001  . Ovarian Cancer [place]  09/2008    reports that she has never smoked. She has never used smokeless tobacco. She reports that she does not drink alcohol or use drugs. family history includes Alzheimer's disease (age of onset: 51) in her mother; Breast cancer in her cousin; Cancer in her cousin; Diabetes in her paternal aunt; Emphysema in her paternal uncle; Heart Problems in her maternal uncle, paternal uncle, and paternal uncle; Heart attack in her maternal grandfather and maternal uncle; Infertility in her daughter; Leukemia (age of onset: 11) in her cousin; Lung cancer in her father; Other in her cousin, cousin, maternal grandmother, and paternal aunt; Other (age of onset: 64) in her mother; Stomach cancer in her cousin and other. Allergies    Allergen Reactions  . Actonel [Risedronate Sodium]     Upset stomach  . Boniva [Ibandronate Sodium]     cramp  . Calcium Channel Blockers     Upset stomach  . Compazine     Daughter reacts to compazine/pt does not want to take  . Lyrica [Pregabalin]     Dizzy       Outpatient Encounter Prescriptions as of 04/11/2017  Medication Sig  . aspirin 81 MG EC tablet Take 81 mg by mouth daily.    . bimatoprost (LUMIGAN) 0.03 % ophthalmic solution Place 1 drop into both eyes at bedtime.    . Cholecalciferol (VITAMIN D3 SUPER STRENGTH) 2000 UNITS TABS Take 2 tablets (4,000 Units total) by mouth every morning.  . denosumab (PROLIA) 60 MG/ML SOLN injection Inject 60 mg into the skin every 6 (six) months. Administer in upper arm, thigh, or abdomen  . diphenoxylate-atropine (LOMOTIL) 2.5-0.025 MG tablet Take 1 tablet by mouth 4 (four) times daily as needed for diarrhea or loose stools. (Patient taking differently: Take 1 tablet by mouth 2 (two) times daily. )  . hydrALAZINE (APRESOLINE) 25 MG tablet Take one tablet (25 mg) by mouth every 6 hours as needed for elevated blood pressure  . Magnesium Oxide 400 MG CAPS Take 1 capsule (400 mg total) by mouth 2 (two) times daily.  [name] NASCOBAL 500 MCG/0.1ML SOLN USE  ONE SPRAY NASALLY ONCE  A WEEK  . RABEprazole (ACIPHEX) 20 MG tablet Take 1 tablet (20 mg total) by mouth daily.  . timolol (TIMOPTIC-XR) 0.5 % ophthalmic gel-forming Place 1 drop into both eyes daily.   [name] triamcinolone cream (KENALOG) 0.1 % Apply 1 application topically 2 (two) times daily.  . colestipol (COLESTID) 1 g tablet Take 2 tablets (2 g total) by mouth 2 (two) times daily. (Patient not taking: Reported on 04/03/2017)   Facility-Administered Encounter Medications as of 04/11/2017  Medication  . 0.9 %  sodium chloride infusion     REVIEW OF SYSTEMS  : All other systems reviewed and negative except where noted in the History of Present Illness.   PHYSICAL EXAM: BP (!) 150/80   Pulse  72   Ht 5\' 3"  (1.6 m)   Wt 188 lb 12.8 oz (85.6 kg)   BMI 33.44 kg/m  General: Well developed white female in no acute distress Head: Normocephalic and atraumatic Eyes:  Sclerae anicteric, conjunctiva pink. Ears: Normal auditory acuity Lungs: Clear throughout to auscultation; no increased WOB. Heart: Regular rate and rhythm Abdomen: Soft, non-distended. Normal bowel sounds.  Mild diffuse upper abdominal TTP. Musculoskeletal: Symmetrical with no gross deformities  Skin: No lesions on visible extremities Extremities: No edema  Neurological: Alert oriented x 4, grossly non-focal Psychological:  Alert and cooperative. Normal mood and affect  ASSESSMENT AND PLAN: -71 year old female who is 8 days post colonoscopy who presents with complaints of quite severe upper abdominal pain that has been intermittent for the past 3 days or so. I doubt that this is a complication related to colonoscopy due to the time frame, etic.  We'll check abdominal x-ray, 2 view abdomen. We'll also check CBC and CMP.  Can try levsin prn for now as well, will send prescription.  Further decisions pending those results.   CC:  [name], Evie Lacks, MD

## 2017-04-12 ENCOUNTER — Ambulatory Visit (HOSPITAL_COMMUNITY)
Admission: RE | Admit: 2017-04-12 | Discharge: 2017-04-12 | Disposition: A | Discharge status: Home / Self Care | Payer: Medicare Other | Source: Ambulatory Visit | Attending: Gastroenterology | Admitting: Gastroenterology

## 2017-04-12 DIAGNOSIS — K8020 Calculus of gallbladder without cholecystitis without obstruction: Secondary | ICD-10-CM | POA: Insufficient documentation

## 2017-04-12 DIAGNOSIS — K760 Fatty (change of) liver, not elsewhere classified: Secondary | ICD-10-CM | POA: Diagnosis not present

## 2017-04-12 DIAGNOSIS — R1010 Upper abdominal pain, unspecified: Secondary | ICD-10-CM | POA: Diagnosis not present

## 2017-04-12 DIAGNOSIS — R748 Abnormal levels of other serum enzymes: Secondary | ICD-10-CM | POA: Diagnosis not present

## 2017-04-13 ENCOUNTER — Other Ambulatory Visit: Payer: Self-pay

## 2017-04-13 ENCOUNTER — Telehealth: Payer: Self-pay | Admitting: Gastroenterology

## 2017-04-13 ENCOUNTER — Other Ambulatory Visit (INDEPENDENT_AMBULATORY_CARE_PROVIDER_SITE_OTHER): Payer: Medicare Other

## 2017-04-13 DIAGNOSIS — R945 Abnormal results of liver function studies: Principal | ICD-10-CM

## 2017-04-13 DIAGNOSIS — K8081 Other cholelithiasis with obstruction: Secondary | ICD-10-CM

## 2017-04-13 DIAGNOSIS — K8080 Other cholelithiasis without obstruction: Secondary | ICD-10-CM | POA: Diagnosis not present

## 2017-04-13 DIAGNOSIS — R7989 Other specified abnormal findings of blood chemistry: Secondary | ICD-10-CM

## 2017-04-13 LAB — HEPATIC FUNCTION PANEL
ALT: 368 U/L — AB (ref 0–35)
AST: 153 U/L — AB (ref 0–37)
Albumin: 4.4 g/dL (ref 3.5–5.2)
Alkaline Phosphatase: 213 U/L — ABNORMAL HIGH (ref 39–117)
BILIRUBIN DIRECT: 0.2 mg/dL (ref 0.0–0.3)
BILIRUBIN TOTAL: 0.8 mg/dL (ref 0.2–1.2)
TOTAL PROTEIN: 7.8 g/dL (ref 6.0–8.3)

## 2017-04-13 NOTE — Telephone Encounter (Signed)
Result Notes   Notes recorded by Loralie Champagne, PA-C on 04/13/2017 at 8:54 AM EDT Please let the patient know that her ultrasound from yesterday showed that she does have gallstones. Her pain sounds like it could be intermittent issues with passing stones. How has she been feeling? If she is feeling ok at the moment then I would like her to come in for repeat labs today to recheck her LFTs and see what they look like. Also, please refer her to [facility] surgery for suspected symptomatic cholelithiasis.   The pt has been advised and the referral has been made to [facility].  The pt will have labs today

## 2017-04-14 ENCOUNTER — Telehealth: Payer: Self-pay | Admitting: Gastroenterology

## 2017-04-14 NOTE — Telephone Encounter (Signed)
I spoke with [facility] and they state they have the referral and should call the pt by Monday.  The pt is aware and will call back if she has not heard from that office on Monday

## 2017-04-17 ENCOUNTER — Other Ambulatory Visit (INDEPENDENT_AMBULATORY_CARE_PROVIDER_SITE_OTHER): Payer: Medicare Other

## 2017-04-17 DIAGNOSIS — R109 Unspecified abdominal pain: Secondary | ICD-10-CM

## 2017-04-17 LAB — HEPATIC FUNCTION PANEL
ALT: 117 U/L — AB (ref 0–35)
AST: 36 U/L (ref 0–37)
Albumin: 4.2 g/dL (ref 3.5–5.2)
Alkaline Phosphatase: 124 U/L — ABNORMAL HIGH (ref 39–117)
BILIRUBIN TOTAL: 0.7 mg/dL (ref 0.2–1.2)
Bilirubin, Direct: 0.3 mg/dL (ref 0.0–0.3)
Total Protein: 6.9 g/dL (ref 6.0–8.3)

## 2017-04-17 NOTE — Progress Notes (Signed)
Patient seen in my absence. Assessment and plans reviewed. Also, laboratories and ultrasound reviewed. Patient has symptomatic cholelithiasis and likely transient choledocholithiasis as bile duct is nondilated and LFTs have improved. Agree with timely surgical evaluation for laparoscopic cholecystectomy with IOC.

## 2017-04-25 ENCOUNTER — Encounter: Payer: Self-pay | Admitting: Internal Medicine

## 2017-04-25 ENCOUNTER — Ambulatory Visit (INDEPENDENT_AMBULATORY_CARE_PROVIDER_SITE_OTHER): Payer: Medicare Other | Admitting: Internal Medicine

## 2017-04-25 DIAGNOSIS — K819 Cholecystitis, unspecified: Secondary | ICD-10-CM

## 2017-04-25 DIAGNOSIS — E538 Deficiency of other specified B group vitamins: Secondary | ICD-10-CM | POA: Diagnosis not present

## 2017-04-25 DIAGNOSIS — R1013 Epigastric pain: Secondary | ICD-10-CM | POA: Diagnosis not present

## 2017-04-25 DIAGNOSIS — R1084 Generalized abdominal pain: Secondary | ICD-10-CM | POA: Diagnosis not present

## 2017-04-25 DIAGNOSIS — G8929 Other chronic pain: Secondary | ICD-10-CM

## 2017-04-25 NOTE — Assessment & Plan Note (Signed)
8/18 [place]: 1. Hepatic steatosis.                2. Cholelithiasis. surg ref

## 2017-04-25 NOTE — Assessment & Plan Note (Signed)
8/18 [place]: 1. Hepatic steatosis.                2. Cholelithiasis.  Surg appt pending

## 2017-04-25 NOTE — Progress Notes (Signed)
Subjective:  Patient ID: [name], female    DOB: 1946-06-27  Age: 71 y.o. MRN: [id number]  CC: No chief complaint on file.   HPI [name] presents for IBS-D, gallstones - recent dx, HTN f/u  Outpatient Medications Prior to Visit  Medication Sig Dispense Refill  . aspirin 81 MG EC tablet Take 81 mg by mouth daily.      . bimatoprost (LUMIGAN) 0.03 % ophthalmic solution Place 1 drop into both eyes at bedtime.      . Cholecalciferol (VITAMIN D3 SUPER STRENGTH) 2000 UNITS TABS Take 2 tablets (4,000 Units total) by mouth every morning. 100 each 3  . colestipol (COLESTID) 1 g tablet Take 2 tablets (2 g total) by mouth 2 (two) times daily. 120 tablet 3  . denosumab (PROLIA) 60 MG/ML SOLN injection Inject 60 mg into the skin every 6 (six) months. Administer in upper arm, thigh, or abdomen    . diphenoxylate-atropine (LOMOTIL) 2.5-0.025 MG tablet Take 1 tablet by mouth 4 (four) times daily as needed for diarrhea or loose stools. (Patient taking differently: Take 1 tablet by mouth 2 (two) times daily. ) 60 tablet 3  . hydrALAZINE (APRESOLINE) 25 MG tablet Take one tablet (25 mg) by mouth every 6 hours as needed for elevated blood pressure 30 tablet 3  . Hyoscyamine Sulfate SL (LEVSIN/SL) 0.125 MG SUBL Dissolve 1 tab on the tongue every 6 hours as needed for pain. 15 each 0  . Magnesium Oxide 400 MG CAPS Take 1 capsule (400 mg total) by mouth 2 (two) times daily.  0  . NASCOBAL 500 MCG/0.1ML SOLN USE ONE SPRAY NASALLY ONCE  A WEEK 3 Bottle 1  . RABEprazole (ACIPHEX) 20 MG tablet Take 1 tablet (20 mg total) by mouth daily. 90 tablet 3  . timolol (TIMOPTIC-XR) 0.5 % ophthalmic gel-forming Place 1 drop into both eyes daily.     [name] triamcinolone cream (KENALOG) 0.1 % Apply 1 application topically 2 (two) times daily.     Facility-Administered Medications Prior to Visit  Medication Dose Route Frequency Provider Last Rate Last Dose  . 0.9 %  sodium chloride infusion  500 mL Intravenous  Continuous Irene Shipper, MD        ROS Review of Systems  Constitutional: Positive for fatigue. Negative for activity change, appetite change, chills and unexpected weight change.  HENT: Negative for congestion, mouth sores and sinus pressure.   Eyes: Negative for visual disturbance.  Respiratory: Negative for cough and chest tightness.   Gastrointestinal: Positive for diarrhea and nausea. Negative for abdominal pain.  Genitourinary: Negative for difficulty urinating, frequency and vaginal pain.  Musculoskeletal: Negative for back pain and gait problem.  Skin: Negative for pallor and rash.  Neurological: Negative for dizziness, tremors, weakness, numbness and headaches.  Psychiatric/Behavioral: Negative for confusion and sleep disturbance. The patient is nervous/anxious.     Objective:  BP 138/82 (BP Location: Left Arm, Patient Position: Sitting, Cuff Size: Large)   Pulse 85   Temp 98.1 F (36.7 C) (Oral)   Ht 5\' 3"  (1.6 m)   Wt 186 lb (84.4 kg)   SpO2 98%   BMI 32.95 kg/m   BP Readings from Last 3 Encounters:  04/25/17 138/82  04/11/17 (!) 150/80  04/03/17 (!) 157/49    Wt Readings from Last 3 Encounters:  04/25/17 186 lb (84.4 kg)  04/11/17 188 lb 12.8 oz (85.6 kg)  04/03/17 191 lb (86.6 kg)    Physical Exam  Constitutional: She  appears well-developed. No distress.  HENT:  Head: Normocephalic.  Right Ear: External ear normal.  Left Ear: External ear normal.  Nose: Nose normal.  Mouth/Throat: Oropharynx is clear and moist.  Eyes: Pupils are equal, round, and reactive to light. Conjunctivae are normal. Right eye exhibits no discharge. Left eye exhibits no discharge.  Neck: Normal range of motion. Neck supple. No JVD present. No tracheal deviation present. No thyromegaly present.  Cardiovascular: Normal rate, regular rhythm and normal heart sounds.   Pulmonary/Chest: No stridor. No respiratory distress. She has no wheezes.  Abdominal: Soft. Bowel sounds are normal.  She exhibits no distension and no mass. There is no tenderness. There is no rebound and no guarding.  Musculoskeletal: She exhibits no edema or tenderness.  Lymphadenopathy:    She has no cervical adenopathy.  Neurological: She displays normal reflexes. No cranial nerve deficit. She exhibits normal muscle tone. Coordination normal.  Skin: No rash noted. No erythema.  Psychiatric: She has a normal mood and affect. Her behavior is normal. Judgment and thought content normal.    Lab Results  Component Value Date   WBC 6.1 04/11/2017   HGB 12.1 04/11/2017   HCT 36.3 04/11/2017   PLT 225.0 04/11/2017   GLUCOSE 154 (H) 04/11/2017   CHOL 170 09/26/2016   TRIG 148.0 09/26/2016   HDL 40.10 09/26/2016   LDLCALC 101 (H) 09/26/2016   ALT 117 (H) 04/17/2017   AST 36 04/17/2017   NA 140 04/11/2017   K 3.3 (L) 04/11/2017   CL 103 04/11/2017   CREATININE 0.71 04/11/2017   BUN 12 04/11/2017   CO2 29 04/11/2017   TSH 1.530 11/28/2016   HGBA1C 6.0 02/24/2017    US Abdomen Complete  Result Date: 04/12/2017 CLINICAL DATA:  Elevated liver enzymes, abdominal pain. EXAM: ABDOMEN ULTRASOUND COMPLETE COMPARISON:  CT abdomen pelvis 12/22/2014. FINDINGS: Gallbladder: Shadowing echogenic stones in the gallbladder measure up to 6 mm. No wall thickening. Negative sonographic Murphy sign. Common bile duct: Diameter: 3 mm, within normal limits. Liver: Diffusely increased in echogenicity. No definite focal lesion. IVC: No abnormality visualized. Pancreas: Visualization is limited by bowel gas. Spleen: 8.3 cm, negative. Right Kidney: Length: 11.5 cm. Echogenicity within normal limits. No mass or hydronephrosis visualized. Left Kidney: Length: 10.8 cm. Echogenicity within normal limits. No mass or hydronephrosis visualized. Abdominal aorta: No aneurysm visualized. Other findings: None. IMPRESSION: 1. Hepatic steatosis. 2. Cholelithiasis. Electronically Signed   By: Lorin Picket M.D.   On: 04/12/2017 14:33     Assessment & Plan:   There are no diagnoses linked to this encounter. I am having [name] maintain her aspirin, bimatoprost, timolol, denosumab, Cholecalciferol, colestipol, RABEprazole, diphenoxylate-atropine, NASCOBAL, Magnesium Oxide, hydrALAZINE, triamcinolone cream, and Hyoscyamine Sulfate SL. We will continue to administer sodium chloride.  No orders of the defined types were placed in this encounter.    Follow-up: No Follow-up on file.  Walker Kehr, MD

## 2017-04-25 NOTE — Assessment & Plan Note (Signed)
8/18 [place]: 1. Hepatic steatosis.                2. Cholelithiasis.  Pain and elevated LFTs. Surg consult pending

## 2017-04-25 NOTE — Assessment & Plan Note (Signed)
On B12 

## 2017-05-11 ENCOUNTER — Other Ambulatory Visit: Payer: Self-pay | Admitting: General Surgery

## 2017-05-11 DIAGNOSIS — Z90710 Acquired absence of both cervix and uterus: Secondary | ICD-10-CM | POA: Diagnosis not present

## 2017-05-11 DIAGNOSIS — R945 Abnormal results of liver function studies: Secondary | ICD-10-CM | POA: Diagnosis not present

## 2017-05-11 DIAGNOSIS — Z8543 Personal history of malignant neoplasm of ovary: Secondary | ICD-10-CM | POA: Diagnosis not present

## 2017-05-11 DIAGNOSIS — Z90722 Acquired absence of ovaries, bilateral: Secondary | ICD-10-CM | POA: Diagnosis not present

## 2017-05-11 DIAGNOSIS — K8020 Calculus of gallbladder without cholecystitis without obstruction: Secondary | ICD-10-CM | POA: Diagnosis not present

## 2017-05-11 DIAGNOSIS — Z9079 Acquired absence of other genital organ(s): Secondary | ICD-10-CM | POA: Diagnosis not present

## 2017-05-11 DIAGNOSIS — Z6832 Body mass index (BMI) 32.0-32.9, adult: Secondary | ICD-10-CM | POA: Diagnosis not present

## 2017-05-11 DIAGNOSIS — K529 Noninfective gastroenteritis and colitis, unspecified: Secondary | ICD-10-CM | POA: Diagnosis not present

## 2017-05-12 ENCOUNTER — Encounter (HOSPITAL_COMMUNITY)
Admission: RE | Admit: 2017-05-12 | Discharge: 2017-05-12 | Disposition: A | Discharge status: Home / Self Care | Payer: Medicare Other | Source: Ambulatory Visit | Attending: General Surgery | Admitting: General Surgery

## 2017-05-12 ENCOUNTER — Encounter (HOSPITAL_COMMUNITY): Payer: Self-pay | Admitting: *Deleted

## 2017-05-12 DIAGNOSIS — K8080 Other cholelithiasis without obstruction: Secondary | ICD-10-CM | POA: Diagnosis not present

## 2017-05-12 DIAGNOSIS — Z01812 Encounter for preprocedural laboratory examination: Secondary | ICD-10-CM | POA: Diagnosis not present

## 2017-05-12 LAB — COMPREHENSIVE METABOLIC PANEL
ALK PHOS: 61 U/L (ref 38–126)
ALT: 20 U/L (ref 14–54)
AST: 21 U/L (ref 15–41)
Albumin: 4.1 g/dL (ref 3.5–5.0)
Anion gap: 9 (ref 5–15)
BILIRUBIN TOTAL: 0.9 mg/dL (ref 0.3–1.2)
BUN: 17 mg/dL (ref 6–20)
CALCIUM: 8.9 mg/dL (ref 8.9–10.3)
CHLORIDE: 106 mmol/L (ref 101–111)
CO2: 25 mmol/L (ref 22–32)
CREATININE: 0.72 mg/dL (ref 0.44–1.00)
Glucose, Bld: 100 mg/dL — ABNORMAL HIGH (ref 65–99)
Potassium: 3.3 mmol/L — ABNORMAL LOW (ref 3.5–5.1)
Sodium: 140 mmol/L (ref 135–145)
TOTAL PROTEIN: 7.9 g/dL (ref 6.5–8.1)

## 2017-05-12 LAB — CBC WITH DIFFERENTIAL/PLATELET
Basophils Absolute: 0 10*3/uL (ref 0.0–0.1)
Basophils Relative: 0 %
EOS PCT: 2 %
Eosinophils Absolute: 0.1 10*3/uL (ref 0.0–0.7)
HEMATOCRIT: 33.7 % — AB (ref 36.0–46.0)
Hemoglobin: 11.5 g/dL — ABNORMAL LOW (ref 12.0–15.0)
LYMPHS ABS: 1.3 10*3/uL (ref 0.7–4.0)
LYMPHS PCT: 17 %
MCH: 28.3 pg (ref 26.0–34.0)
MCHC: 34.1 g/dL (ref 30.0–36.0)
MCV: 82.8 fL (ref 78.0–100.0)
Monocytes Absolute: 0.5 10*3/uL (ref 0.1–1.0)
Monocytes Relative: 7 %
NEUTROS ABS: 5.4 10*3/uL (ref 1.7–7.7)
Neutrophils Relative %: 74 %
Platelets: 199 10*3/uL (ref 150–400)
RBC: 4.07 MIL/uL (ref 3.87–5.11)
RDW: 13.7 % (ref 11.5–15.5)
WBC: 7.3 10*3/uL (ref 4.0–10.5)

## 2017-05-12 LAB — LIPASE, BLOOD: LIPASE: 29 U/L (ref 11–51)

## 2017-05-12 NOTE — Patient Instructions (Addendum)
[name]  05/12/2017   Your procedure is scheduled on: Monday 05/15/2017  Report to [facility] Main  Entrance Take [place]  elevators to 3rd floor to  [place] at  1230 PM.    Call this number if you have problems the morning of surgery [phone]   Remember: ONLY 1 PERSON MAY GO WITH YOU TO SHORT STAY TO GET  READY MORNING OF [place].   Do not eat food  :After Midnight.  May have clear liquids from midnight up until 0830 am then nothing until after surgery!      CLEAR LIQUID DIET   Foods Allowed                                                                     Foods Excluded  Coffee and tea, regular and decaf                             liquids that you cannot  Plain Jell-O in any flavor                                             see through such as: Fruit ices (not with fruit pulp)                                     milk, soups, orange juice  Iced Popsicles                                    All solid food Carbonated beverages, regular and diet                                    Cranberry, grape and apple juices Sports drinks like Gatorade Lightly seasoned clear broth or consume(fat free) Sugar, honey syrup  Sample Menu Breakfast                                Lunch                                     Supper Cranberry juice                    Beef broth                            Chicken broth Jell-O                                     Grape  juice                           Apple juice Coffee or tea                        Jell-O                                      Popsicle                                                Coffee or tea                        Coffee or tea  _____________________________________________________________________     Take these medicines the morning of surgery with A SIP OF WATER: Hydrazaline (Apresoline) if needed , Rabeprazole (Aciphex), use Timoptic eye drops                                 You may not have any metal on your body including hair pins and              piercings  Do not wear jewelry, make-up, lotions, powders or perfumes, deodorant             Do not wear nail polish.  Do not shave  48 hours prior to surgery.              Men may shave face and neck.   Do not bring valuables to the hospital. [place].  Contacts, dentures or bridgework may not be worn into surgery.  Leave suitcase in the car. After surgery it may be brought to your room.     Patients discharged the day of surgery will not be allowed to drive home.  Name and phone number of your driver:  Special Instructions: N/A              Please read over the following fact sheets you were given: _____________________________________________________________________             [facility] - Preparing for Surgery Before surgery, you can play an important role.  Because skin is not sterile, your skin needs to be as free of germs as possible.  You can reduce the number of germs on your skin by washing with CHG (chlorahexidine gluconate) soap before surgery.  CHG is an antiseptic cleaner which kills germs and bonds with the skin to continue killing germs even after washing. Please DO NOT use if you have an allergy to CHG or antibacterial soaps.  If your skin becomes reddened/irritated stop using the CHG and inform your nurse when you arrive at Short Stay. Do not shave (including legs and underarms) for at least 48 hours prior to the first CHG shower.  You may shave your face/neck. Please follow these instructions carefully:  1.  Shower with CHG Soap the night before surgery and the  morning of Surgery.  2.  If you choose to wash your hair, wash your  hair first as usual with your  normal  shampoo.  3.  After you shampoo, rinse your hair and body thoroughly to remove the  shampoo.                           4.  Use CHG as you would any other liquid  soap.  You can apply chg directly  to the skin and wash                       Gently with a scrungie or clean washcloth.  5.  Apply the CHG Soap to your body ONLY FROM THE NECK DOWN.   Do not use on face/ open                           Wound or open sores. Avoid contact with eyes, ears mouth and genitals (private parts).                       Wash face,  Genitals (private parts) with your normal soap.             6.  Wash thoroughly, paying special attention to the area where your surgery  will be performed.  7.  Thoroughly rinse your body with warm water from the neck down.  8.  DO NOT shower/wash with your normal soap after using and rinsing off  the CHG Soap.                9.  Pat yourself dry with a clean towel.            10.  Wear clean pajamas.            11.  Place clean sheets on your bed the night of your first shower and do not  sleep with pets. Day of Surgery : Do not apply any lotions/deodorants the morning of surgery.  Please wear clean clothes to the hospital/surgery center.  FAILURE TO FOLLOW THESE INSTRUCTIONS MAY RESULT IN THE CANCELLATION OF YOUR SURGERY PATIENT SIGNATURE_________________________________  NURSE SIGNATURE__________________________________  ________________________________________________________________________

## 2017-05-12 NOTE — Progress Notes (Signed)
   05/12/17 1328  OBSTRUCTIVE SLEEP APNEA  Have you ever been diagnosed with sleep apnea through a sleep study? No  Do you snore loudly (loud enough to be heard through closed doors)?  1  Do you often feel tired, fatigued, or sleepy during the daytime (such as falling asleep during driving or talking to someone)? 1  Has anyone observed you stop breathing during your sleep? 0  Do you have, or are you being treated for high blood pressure? 1  Age > 50 (1-yes) 1  Neck circumference greater than:Female 16 inches or larger, Female 17inches or larger? 1  Female Gender (Yes=1) 0  Obstructive Sleep Apnea Score 5  Score 5 or greater  Results sent to PCP

## 2017-05-12 NOTE — Progress Notes (Signed)
01/19/2017- noted in EPIC- EKG 11/30/2016- noted in EPIC- ECHO 11/29/2016- noted in [facility]

## 2017-05-14 NOTE — H&P (Signed)
[name] Location: [facility] Patient #: [id number] DOB: 17-Feb-1946 Married / Language: English / Race: White Female        History of Present Illness                                        The patient is a 71 year old female who presents for evaluation of gall stones. This is a very pleasant 71 year old female who is referred by [name] and [name] for management of symptomatic gallstones. I operated on [name] husband earlier this year for gallbladder disease in a more urgent situation. He is with [name] today throughout the catheter. [name] is in no distress today.      The patient has had some vague stomach problems. [name] underwent colonoscopy about a month ago and they found benign tubular adenomas. A few days later [name] was having epigastric and right upper quadrant pain without nausea or vomiting. [name] has chronic diarrhea. [name] noted dark urine but no jaundice. An ultrasound showed gallstones. Normal common duct. Fatty liver. Lab work on April 11, 2017 showed a total bilirubin of 1.9, AST 4-0 nylon. ALT 283. Lipase normal. Lab work on August 13 shows AST back to normal. ALT 117. Total bilirubin 0.7.      [name] describes [name] pain as intermittent and not necessarily related to meals epigastric and right upper quadrant. Recent onset. [name] does have chronic diarrhea with this, a little bit worse recently [name] saw [name] on April 25, 2017.       Comorbidities include ovarian cancer. Underwent open hysterectomy, BSO, omentectomy, and appendectomy in 2010 by Dr. [name]. [name] is followed by [name] in [place]. [name] was discharged from [name] care. [name] apparently has no evidence of disease. BMI 32. Irritable bowel syndrome. Mild hypertension. Some malabsorption. No history of heart disease, lung disease, neurovascular disease or diabetes. Family history reveals mother died age 46 of dementia. Father died of lung cancer Social history  reveals [name] is married and [name] husband is with [name]. He had surgery by me in June. They have 2 daughters. 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 Denies alcohol or tobacco.         I advised [name] to undergo laparoscopic cholecystectomy with cholangiogram. [name] is aware that [name] is at risk for recurrent problems due to the biliary colic and the elevated LFTs. [name] is aware that [name] may have passed a common duct stone. [name] wants to have this surgery done at [facility]. We will schedule at [name] convenience but in the near future I discussed the indications, details, techniques, and numerous risk of the surgery with the patient and [name] husband. [name] is aware the risks of bleeding, infection, conversion to open laparotomy, running into difficulty from previous adhesions, bowel leak, incisional hernia, injury to adjacent organs with major reconstructive surgery, admission for management of common duct stones or other complications. Hopefully this will go smoothly and [name] can go home the same day.   [name] understands all these issues. All [name] questions are answered. [name] agrees with this plan.      I will probably place a port in the left subcostal region to gain access since [name] has a long midline incision.   Past Surgical History  Appendectomy  Colon Polyp Removal - Colonoscopy  Hemorrhoidectomy  Hysterectomy (due to cancer) - Complete  Shoulder Surgery  Bilateral.  Diagnostic Studies History  Colonoscopy  within last year Mammogram  within last year Pap Smear  >5 years ago  Allergies  Compazine *ANTIPSYCHOTICS/ANTIMANIC AGENTS*  Ibandronate Sodium *ENDOCRINE AND METABOLIC AGENTS - MISC.*  Pregabalin *CHEMICALS*  Risedronate Sodium *ENDOCRINE AND METABOLIC AGENTS - MISC.*   Medication History  Aspirin EC (81MG  Tablet DR, Oral) Active. Bimatoprost (0.01% Solution, 1 DROP Ophthalmic as needed) Active. Triamcinolone Acetonide (0.025% Cream, External) Active. Hyoscyamine (0.15MG   Tablet, Oral) Active. HydrALAZINE HCl (25MG  Tablet, Oral every six hours) Active. Magnesium Oxide (400MG  Capsule, Oral daily) Active. Nascobal (500MCG/0.1ML Solution, 1 SPRAY Nasal weekly) Active. Colestipol HCl (1GM Tablet, 2 TABLETS Oral two times daily) Active. Diphenoxylate-Atropine (2.5-0.025MG  Tablet, Oral 4 times a day) Active. RABEprazole Sodium (20MG  Tablet DR, Oral daily) Active. Cholecalciferol (2000UNIT Capsule, 2 TABLETS Oral daily) Active. Denosumab (60MG /ML Solution, EVERY 6 MONTHS Subcutaneous) Active. Timolol Hemihydrate (0.25% Solution, 1 DROP BOTH EYES Ophthalmic Daily) Active. Medications Reconciled  Social History Caffeine use  Carbonated beverages, Tea. No alcohol use  No drug use  Tobacco use  Never smoker.  Family History  Colon Cancer  Father. Migraine Headache  Daughter. Respiratory Condition  Father.  Pregnancy / Birth History  Age at menarche  51 years. Age of menopause  51-55 Contraceptive History  Intrauterine device, Oral contraceptives. Gravida  2 Maternal age  16-25 Para  2  Other Problems  Anxiety Disorder  Back Pain  Cholelithiasis  Diverticulosis  Gastroesophageal Reflux Disease  Heart murmur  Hemorrhoids  High blood pressure  Kidney Stone  Oophorectomy  Bilateral. Ovarian Cancer     Review of Systems  General Not Present- Appetite Loss, Chills, Fatigue, Fever, Night Sweats, Weight Gain and Weight Loss. Skin Not Present- Change in Wart/Mole, Dryness, Hives, Jaundice, New Lesions, Non-Healing Wounds, Rash and Ulcer. HEENT Present- Ringing in the Ears and Wears glasses/contact lenses. Not Present- Earache, Hearing Loss, Hoarseness, Nose Bleed, Oral Ulcers, Seasonal Allergies, Sinus Pain, Sore Throat, Visual Disturbances and Yellow Eyes. Respiratory Not Present- Bloody sputum, Chronic Cough, Difficulty Breathing, Snoring and Wheezing. Breast Not Present- Breast Mass, Breast Pain, Nipple Discharge and  Skin Changes. Cardiovascular Present- Rapid Heart Rate. Not Present- Chest Pain, Difficulty Breathing Lying Down, Leg Cramps, Palpitations, Shortness of Breath and Swelling of Extremities. Gastrointestinal Present- Abdominal Pain, Change in Bowel Habits, Chronic diarrhea and Hemorrhoids. Not Present- Bloating, Bloody Stool, Constipation, Difficulty Swallowing, Excessive gas, Gets full quickly at meals, Indigestion, Nausea, Rectal Pain and Vomiting. Female Genitourinary Present- Frequency, Pelvic Pain and Urgency. Not Present- Nocturia and Painful Urination. Musculoskeletal Present- Back Pain. Not Present- Joint Pain, Joint Stiffness, Muscle Pain, Muscle Weakness and Swelling of Extremities. Neurological Present- Tingling. Not Present- Decreased Memory, Fainting, Headaches, Numbness, Seizures, Tremor, Trouble walking and Weakness. Psychiatric Present- Anxiety. Not Present- Bipolar, Change in Sleep Pattern, Depression, Fearful and Frequent crying. Endocrine Not Present- Cold Intolerance, Excessive Hunger, Hair Changes, Heat Intolerance, Hot flashes and New Diabetes. Hematology Not Present- Blood Thinners, Easy Bruising, Excessive bleeding, Gland problems, HIV and Persistent Infections.  Vitals Weight: 186 lb Height: 63in Body Surface Area: 1.88 m Body Mass Index: 32.95 kg/m  Temp.: 98.41F  Pulse: 97 (Regular)  BP: 150/85 (Sitting, Left Arm, Standard)    Physical Exam  General Mental Status-Alert. General Appearance-Consistent with stated age. Hydration-Well hydrated. Voice-Normal.  Head and Neck Head-normocephalic, atraumatic with no lesions or palpable masses. Trachea-midline. Thyroid Gland Characteristics - normal size and consistency.  Eye Eyeball - Bilateral-Extraocular movements intact. Sclera/Conjunctiva - Bilateral-No scleral icterus.  Chest and Lung  Exam Chest and lung exam reveals -quiet, even and easy respiratory effort with no use of  accessory muscles and on auscultation, normal breath sounds, no adventitious sounds and normal vocal resonance. Inspection Chest Wall - Normal. Back - normal.  Cardiovascular Cardiovascular examination reveals -normal heart sounds, regular rate and rhythm with no murmurs and normal pedal pulses bilaterally.  Abdomen Note: Abdomen is soft and nontender. No palpable mass. No organomegaly. No Murphy sign. Costal margins nontender to percussion. There is a lower midline incision which starts a little bit above the umbilicus, goes around to the right of the umbilicus and goes all the way down to the symphysis pubis. No mass or hernia. No distention.   Neurologic Neurologic evaluation reveals -alert and oriented x 3 with no impairment of recent or remote memory. Mental Status-Normal.  Musculoskeletal Normal Exam - Left-Upper Extremity Strength Normal and Lower Extremity Strength Normal. Normal Exam - Right-Upper Extremity Strength Normal and Lower Extremity Strength Normal.  Lymphatic Head & Neck  General Head & Neck Lymphatics: Bilateral - Description - Normal. Axillary  General Axillary Region: Bilateral - Description - Normal. Tenderness - Non Tender. Femoral & Inguinal  Generalized Femoral & Inguinal Lymphatics: Bilateral - Description - Normal. Tenderness - Non Tender.    Assessment & Plan  GALLSTONES (K80.20).   Your recent episodes of upper abdominal pain and abnormal liver function tests are almost certainly due to gallbladder disease with gallstones You may have passed a common bile duct stone These problems will most likely persist in the future and can become progressive and be associated with complications Dr. Dalbert Batman has advised that you proceed with gallbladder surgery and you agree  you will be scheduled for laparoscopic cholecystectomy with cholangiogram, possible open cholecystectomy in the near future at [facility] long We have discussed the indications,  techniques, and risk of the surgery in detail with you and your husband Please read over the patient information booklet that I gave you.  ABNORMAL LIVER FUNCTION TEST (R94.5) HISTORY OF OVARIAN CANCER IN ADULTHOOD (Z85.43) HISTORY OF TOTAL ABDOMINAL HYSTERECTOMY AND BILATERAL SALPINGO-OOPHORECTOMY (Z90.710) CHRONIC DIARRHEA (K52.9) BMI 32.0-32.9,ADULT (B16.60)    Edsel Petrin. Dalbert Batman, M.D., [facility] General and Minimally invasive Surgery Breast and Colorectal Surgery Office:   [phone] Pager:   [phone]

## 2017-05-15 ENCOUNTER — Encounter (HOSPITAL_COMMUNITY): Payer: Self-pay | Admitting: Emergency Medicine

## 2017-05-15 ENCOUNTER — Ambulatory Visit (HOSPITAL_COMMUNITY): Payer: Medicare Other

## 2017-05-15 ENCOUNTER — Encounter (HOSPITAL_COMMUNITY)
Admission: RE | Disposition: A | Discharge status: Home / Self Care | Payer: Self-pay | Source: Ambulatory Visit | Attending: General Surgery

## 2017-05-15 ENCOUNTER — Ambulatory Visit (HOSPITAL_COMMUNITY)
Admission: RE | Admit: 2017-05-15 | Discharge: 2017-05-16 | Disposition: A | Discharge status: Home / Self Care | Payer: Medicare Other | Source: Ambulatory Visit | Attending: General Surgery | Admitting: General Surgery

## 2017-05-15 DIAGNOSIS — Z86010 Personal history of colonic polyps: Secondary | ICD-10-CM

## 2017-05-15 DIAGNOSIS — F419 Anxiety disorder, unspecified: Secondary | ICD-10-CM | POA: Diagnosis not present

## 2017-05-15 DIAGNOSIS — K589 Irritable bowel syndrome without diarrhea: Secondary | ICD-10-CM | POA: Diagnosis not present

## 2017-05-15 DIAGNOSIS — K219 Gastro-esophageal reflux disease without esophagitis: Secondary | ICD-10-CM | POA: Diagnosis not present

## 2017-05-15 DIAGNOSIS — Z79899 Other long term (current) drug therapy: Secondary | ICD-10-CM | POA: Diagnosis not present

## 2017-05-15 DIAGNOSIS — K660 Peritoneal adhesions (postprocedural) (postinfection): Secondary | ICD-10-CM | POA: Diagnosis not present

## 2017-05-15 DIAGNOSIS — Z8543 Personal history of malignant neoplasm of ovary: Secondary | ICD-10-CM | POA: Diagnosis not present

## 2017-05-15 DIAGNOSIS — I10 Essential (primary) hypertension: Secondary | ICD-10-CM | POA: Diagnosis not present

## 2017-05-15 DIAGNOSIS — Z419 Encounter for procedure for purposes other than remedying health state, unspecified: Secondary | ICD-10-CM

## 2017-05-15 DIAGNOSIS — Z90722 Acquired absence of ovaries, bilateral: Secondary | ICD-10-CM | POA: Diagnosis not present

## 2017-05-15 DIAGNOSIS — Z7982 Long term (current) use of aspirin: Secondary | ICD-10-CM | POA: Diagnosis not present

## 2017-05-15 DIAGNOSIS — K819 Cholecystitis, unspecified: Secondary | ICD-10-CM | POA: Diagnosis present

## 2017-05-15 DIAGNOSIS — K8010 Calculus of gallbladder with chronic cholecystitis without obstruction: Secondary | ICD-10-CM | POA: Diagnosis not present

## 2017-05-15 DIAGNOSIS — K8020 Calculus of gallbladder without cholecystitis without obstruction: Secondary | ICD-10-CM | POA: Diagnosis not present

## 2017-05-15 HISTORY — PX: CHOLECYSTECTOMY: SHX55

## 2017-05-15 LAB — CREATININE, SERUM
Creatinine, Ser: 0.8 mg/dL (ref 0.44–1.00)
GFR calc non Af Amer: >60 mL/min (ref >60)

## 2017-05-15 LAB — CBC
HCT: 32.4 % — ABNORMAL LOW (ref 36.0–46.0)
Hemoglobin: 10.9 g/dL — ABNORMAL LOW (ref 12.0–15.0)
MCH: 28.7 pg (ref 26.0–34.0)
MCHC: 33.6 g/dL (ref 30.0–36.0)
MCV: 85.3 fL (ref 78.0–100.0)
Platelets: 171 10*3/uL (ref 150–400)
RBC: 3.8 MIL/uL — ABNORMAL LOW (ref 3.87–5.11)
RDW: 13.9 % (ref 11.5–15.5)
WBC: 9 10*3/uL (ref 4.0–10.5)

## 2017-05-15 SURGERY — LAPAROSCOPIC CHOLECYSTECTOMY WITH INTRAOPERATIVE CHOLANGIOGRAM
Anesthesia: General

## 2017-05-15 MED ORDER — IOPAMIDOL (ISOVUE-300) INJECTION 61%
INTRAVENOUS | Status: AC
  Filled 2017-05-15: qty 50

## 2017-05-15 MED ORDER — FENTANYL CITRATE (PF) 100 MCG/2ML IJ SOLN
INTRAMUSCULAR | Status: DC | PRN
  Administered 2017-05-15 (×3): 50 ug via INTRAVENOUS
  Administered 2017-05-15: 100 ug via INTRAVENOUS

## 2017-05-15 MED ORDER — SENNA 8.6 MG PO TABS
1.0000 | ORAL_TABLET | Freq: Two times a day (BID) | ORAL | Status: DC
  Administered 2017-05-15 – 2017-05-16 (×2): 8.6 mg via ORAL
  Filled 2017-05-15 (×2): qty 1

## 2017-05-15 MED ORDER — IOPAMIDOL (ISOVUE-300) INJECTION 61%
INTRAVENOUS | Status: DC | PRN
  Administered 2017-05-15: 7 mL

## 2017-05-15 MED ORDER — SCOPOLAMINE 1 MG/3DAYS TD PT72
MEDICATED_PATCH | TRANSDERMAL | Status: DC | PRN
  Administered 2017-05-15: 1 via TRANSDERMAL

## 2017-05-15 MED ORDER — ONDANSETRON HCL 4 MG/2ML IJ SOLN
INTRAMUSCULAR | Status: DC | PRN
  Administered 2017-05-15: 4 mg via INTRAVENOUS

## 2017-05-15 MED ORDER — CHLORHEXIDINE GLUCONATE CLOTH 2 % EX PADS: 6.0000 | MEDICATED_PAD | Freq: Once | CUTANEOUS | Status: DC

## 2017-05-15 MED ORDER — METHOCARBAMOL 500 MG PO TABS: 500.0000 mg | ORAL_TABLET | Freq: Four times a day (QID) | ORAL | Status: DC | PRN

## 2017-05-15 MED ORDER — ROCURONIUM BROMIDE 100 MG/10ML IV SOLN
INTRAVENOUS | Status: DC | PRN
  Administered 2017-05-15: 20 mg via INTRAVENOUS
  Administered 2017-05-15: 50 mg via INTRAVENOUS
  Administered 2017-05-15: 10 mg via INTRAVENOUS

## 2017-05-15 MED ORDER — LATANOPROST 0.005 % OP SOLN
1.0000 [drp] | Freq: Every day | OPHTHALMIC | Status: DC
  Administered 2017-05-15: 1 [drp] via OPHTHALMIC
  Filled 2017-05-15: qty 2.5

## 2017-05-15 MED ORDER — SODIUM CHLORIDE 0.9 % IV SOLN
500.0000 mL | INTRAVENOUS | Status: DC
  Administered 2017-05-15: 500 mL via INTRAVENOUS

## 2017-05-15 MED ORDER — LACTATED RINGERS IV SOLN
INTRAVENOUS | Status: DC
  Administered 2017-05-15: 1000 mL via INTRAVENOUS
  Administered 2017-05-15: 13:00:00 via INTRAVENOUS

## 2017-05-15 MED ORDER — PANTOPRAZOLE SODIUM 40 MG PO TBEC
40.0000 mg | DELAYED_RELEASE_TABLET | Freq: Every day | ORAL | Status: DC
  Administered 2017-05-16: 40 mg via ORAL
  Filled 2017-05-15: qty 1

## 2017-05-15 MED ORDER — LIDOCAINE HCL (CARDIAC) 20 MG/ML IV SOLN
INTRAVENOUS | Status: DC | PRN
  Administered 2017-05-15: 100 mg via INTRAVENOUS

## 2017-05-15 MED ORDER — HYDROMORPHONE HCL-NACL 0.5-0.9 MG/ML-% IV SOSY
1.0000 mg | PREFILLED_SYRINGE | INTRAVENOUS | Status: DC | PRN
  Administered 2017-05-15: 1 mg via INTRAVENOUS
  Filled 2017-05-15: qty 2

## 2017-05-15 MED ORDER — HYDROCODONE-ACETAMINOPHEN 5-325 MG PO TABS: 1.0000 | ORAL_TABLET | Freq: Every evening | ORAL | Status: DC | PRN

## 2017-05-15 MED ORDER — HYDROMORPHONE HCL-NACL 0.5-0.9 MG/ML-% IV SOSY
0.2500 mg | PREFILLED_SYRINGE | INTRAVENOUS | Status: DC | PRN
  Administered 2017-05-15 (×2): 0.5 mg via INTRAVENOUS

## 2017-05-15 MED ORDER — TIMOLOL MALEATE 0.5 % OP SOLG
1.0000 [drp] | Freq: Every day | OPHTHALMIC | Status: DC
  Administered 2017-05-16: 1 [drp] via OPHTHALMIC
  Filled 2017-05-15: qty 5

## 2017-05-15 MED ORDER — TRAMADOL HCL 50 MG PO TABS: 50.0000 mg | ORAL_TABLET | Freq: Four times a day (QID) | ORAL | Status: DC | PRN

## 2017-05-15 MED ORDER — DEXAMETHASONE SODIUM PHOSPHATE 10 MG/ML IJ SOLN
INTRAMUSCULAR | Status: DC | PRN
Start: 2017-05-15 — End: 2017-05-15
  Administered 2017-05-15: 10 mg via INTRAVENOUS

## 2017-05-15 MED ORDER — ONDANSETRON HCL 4 MG/2ML IJ SOLN
4.0000 mg | Freq: Four times a day (QID) | INTRAMUSCULAR | Status: DC | PRN
  Administered 2017-05-15: 4 mg via INTRAVENOUS

## 2017-05-15 MED ORDER — DENOSUMAB 60 MG/ML ~~LOC~~ SOLN: 60.0000 mg | SUBCUTANEOUS | Status: DC

## 2017-05-15 MED ORDER — ENOXAPARIN SODIUM 40 MG/0.4ML ~~LOC~~ SOLN
40.0000 mg | SUBCUTANEOUS | Status: DC
Start: 2017-05-16 — End: 2017-05-16

## 2017-05-15 MED ORDER — FENTANYL CITRATE (PF) 250 MCG/5ML IJ SOLN
INTRAMUSCULAR | Status: AC
  Filled 2017-05-15: qty 5

## 2017-05-15 MED ORDER — PROPOFOL 10 MG/ML IV BOLUS
INTRAVENOUS | Status: DC | PRN
  Administered 2017-05-15: 140 mg via INTRAVENOUS

## 2017-05-15 MED ORDER — PROMETHAZINE HCL 25 MG/ML IJ SOLN
12.5000 mg | Freq: Four times a day (QID) | INTRAMUSCULAR | Status: DC | PRN
  Administered 2017-05-15: 12.5 mg via INTRAVENOUS
  Filled 2017-05-15: qty 1

## 2017-05-15 MED ORDER — DIPHENOXYLATE-ATROPINE 2.5-0.025 MG PO TABS: 1.0000 | ORAL_TABLET | Freq: Four times a day (QID) | ORAL | Status: DC | PRN

## 2017-05-15 MED ORDER — LACTATED RINGERS IR SOLN
Status: DC | PRN
  Administered 2017-05-15: 1000 mL

## 2017-05-15 MED ORDER — HYDROCODONE-ACETAMINOPHEN 5-325 MG PO TABS
1.0000 | ORAL_TABLET | ORAL | Status: DC | PRN
  Administered 2017-05-16: 1 via ORAL
  Filled 2017-05-15: qty 1

## 2017-05-15 MED ORDER — ONDANSETRON HCL 4 MG/2ML IJ SOLN
INTRAMUSCULAR | Status: AC
  Filled 2017-05-15: qty 2

## 2017-05-15 MED ORDER — PROPOFOL 10 MG/ML IV BOLUS
INTRAVENOUS | Status: AC
  Filled 2017-05-15: qty 20

## 2017-05-15 MED ORDER — 0.9 % SODIUM CHLORIDE (POUR BTL) OPTIME
TOPICAL | Status: DC | PRN
  Administered 2017-05-15: 1000 mL

## 2017-05-15 MED ORDER — BUPIVACAINE-EPINEPHRINE 0.5% -1:200000 IJ SOLN
INTRAMUSCULAR | Status: DC | PRN
  Administered 2017-05-15: 15 mL

## 2017-05-15 MED ORDER — EPHEDRINE SULFATE 50 MG/ML IJ SOLN
INTRAMUSCULAR | Status: DC | PRN
  Administered 2017-05-15 (×2): 5 mg via INTRAVENOUS

## 2017-05-15 MED ORDER — ASPIRIN EC 81 MG PO TBEC
81.0000 mg | DELAYED_RELEASE_TABLET | Freq: Every day | ORAL | Status: DC
  Administered 2017-05-16: 81 mg via ORAL
  Filled 2017-05-15 (×2): qty 1

## 2017-05-15 MED ORDER — ONDANSETRON 4 MG PO TBDP: 4.0000 mg | ORAL_TABLET | Freq: Four times a day (QID) | ORAL | Status: DC | PRN

## 2017-05-15 MED ORDER — SCOPOLAMINE 1 MG/3DAYS TD PT72
MEDICATED_PATCH | TRANSDERMAL | Status: AC
  Filled 2017-05-15: qty 1

## 2017-05-15 MED ORDER — CEFAZOLIN SODIUM-DEXTROSE 2-4 GM/100ML-% IV SOLN
2.0000 g | INTRAVENOUS | Status: AC
  Administered 2017-05-15: 2 g via INTRAVENOUS
  Filled 2017-05-15: qty 100

## 2017-05-15 MED ORDER — BUPIVACAINE-EPINEPHRINE (PF) 0.5% -1:200000 IJ SOLN
INTRAMUSCULAR | Status: AC
  Filled 2017-05-15: qty 30

## 2017-05-15 MED ORDER — HYDRALAZINE HCL 25 MG PO TABS
25.0000 mg | ORAL_TABLET | Freq: Three times a day (TID) | ORAL | Status: DC
  Administered 2017-05-15: 25 mg via ORAL
  Filled 2017-05-15: qty 1

## 2017-05-15 MED ORDER — SUGAMMADEX SODIUM 200 MG/2ML IV SOLN
INTRAVENOUS | Status: DC | PRN
  Administered 2017-05-15: 200 mg via INTRAVENOUS

## 2017-05-15 MED ORDER — HYDROMORPHONE HCL-NACL 0.5-0.9 MG/ML-% IV SOSY
PREFILLED_SYRINGE | INTRAVENOUS | Status: AC
  Filled 2017-05-15: qty 2

## 2017-05-15 MED ORDER — POTASSIUM CHLORIDE 2 MEQ/ML IV SOLN
INTRAVENOUS | Status: DC
  Administered 2017-05-15: via INTRAVENOUS
  Filled 2017-05-15 (×4): qty 1000

## 2017-05-15 SURGICAL SUPPLY — 36 items
APPLIER CLIP 5 13 M/L LIGAMAX5 (MISCELLANEOUS) ×3
APPLIER CLIP ROT 10 11.4 M/L (STAPLE) ×3
CABLE HIGH FREQUENCY MONO STRZ (ELECTRODE) ×3 IMPLANT
CLIP APPLIE 5 13 M/L LIGAMAX5 (MISCELLANEOUS) ×1 IMPLANT
CLIP APPLIE ROT 10 11.4 M/L (STAPLE) ×1 IMPLANT
COVER MAYO STAND STRL (DRAPES) ×3 IMPLANT
COVER SURGICAL LIGHT HANDLE (MISCELLANEOUS) ×3 IMPLANT
DECANTER SPIKE VIAL GLASS SM (MISCELLANEOUS) ×3 IMPLANT
DERMABOND ADVANCED (GAUZE/BANDAGES/DRESSINGS) ×2
DERMABOND ADVANCED .7 DNX12 (GAUZE/BANDAGES/DRESSINGS) ×1 IMPLANT
DEVICE PMI PUNCTURE CLOSURE (MISCELLANEOUS) ×3 IMPLANT
DRAPE C-ARM 42X120 X-RAY (DRAPES) ×3 IMPLANT
ELECT REM PT RETURN 15FT ADLT (MISCELLANEOUS) ×3 IMPLANT
GLOVE EUDERMIC 7 POWDERFREE (GLOVE) ×3 IMPLANT
GOWN STRL REUS W/TWL XL LVL3 (GOWN DISPOSABLE) ×12 IMPLANT
HEMOSTAT SNOW SURGICEL 2X4 (HEMOSTASIS) IMPLANT
KIT BASIN OR (CUSTOM PROCEDURE TRAY) ×3 IMPLANT
POSITIONER SURGICAL ARM (MISCELLANEOUS) IMPLANT
POUCH RETRIEVAL ECOSAC 10 (ENDOMECHANICALS) ×1 IMPLANT
POUCH RETRIEVAL ECOSAC 10MM (ENDOMECHANICALS) ×2
POUCH SPECIMEN RETRIEVAL 10MM (ENDOMECHANICALS) IMPLANT
SCISSORS LAP 5X35 DISP (ENDOMECHANICALS) ×3 IMPLANT
SET CHOLANGIOGRAPH MIX (MISCELLANEOUS) ×3 IMPLANT
SET IRRIG TUBING LAPAROSCOPIC (IRRIGATION / IRRIGATOR) ×3 IMPLANT
SHEARS HARMONIC ACE PLUS 36CM (ENDOMECHANICALS) ×3 IMPLANT
SLEEVE XCEL OPT CAN 5 100 (ENDOMECHANICALS) ×6 IMPLANT
SUT MNCRL AB 4-0 PS2 18 (SUTURE) ×3 IMPLANT
SUT VICRYL 0 UR6 27IN ABS (SUTURE) ×6 IMPLANT
TAPE CLOTH 4X10 WHT NS (GAUZE/BANDAGES/DRESSINGS) IMPLANT
TOWEL OR 17X26 10 PK STRL BLUE (TOWEL DISPOSABLE) ×3 IMPLANT
TOWEL OR NON WOVEN STRL DISP B (DISPOSABLE) ×3 IMPLANT
TRAY LAPAROSCOPIC (CUSTOM PROCEDURE TRAY) ×3 IMPLANT
TROCAR BLADELESS OPT 5 100 (ENDOMECHANICALS) ×3 IMPLANT
TROCAR XCEL BLUNT TIP 100MML (ENDOMECHANICALS) ×3 IMPLANT
TROCAR XCEL NON-BLD 11X100MML (ENDOMECHANICALS) ×3 IMPLANT
TUBING INSUF HEATED (TUBING) ×3 IMPLANT

## 2017-05-15 NOTE — Progress Notes (Signed)
Patient arrived to floor with nausea and vomiting gold color emesis, around 247mls. Donne Hazel, RN

## 2017-05-15 NOTE — Op Note (Signed)
Patient Name:           [name]   Date of Surgery:        05/15/2017  Pre op Diagnosis:      Chronic cholecystitis with cholelithiasis                                       Abnormal liver function tests                                        History ovarian cancer  Post op Diagnosis:    Same, plus extensive intra-abdominal adhesions  Procedure:                 Diagnostic laparoscopy, extensive lysis of adhesions requiring 45 minutes, laparoscopic cholecystectomy, intraoperative cholangiogram  Surgeon:                     Edsel Petrin. Dalbert Batman, M.D., FACS  Assistant:                      Excell Seltzer, M.D.   Indication for Assistant: complex exposure and complex adhesions.  Assistant indicated to reduce the incidence of intraoperative and postoperative complications  Operative Indications:   . This is a very pleasant 71 year old female who is referred by [name] and Dr. [name] for management of symptomatic gallstones.  I evaluated her in the office recently and she is brought to the operating room electively.       The patient has had some vague stomach problems. She underwent colonoscopy about a month ago and they found benign tubular adenomas. A few days later she was having epigastric and right upper quadrant pain without nausea or vomiting. She has chronic diarrhea. She noted dark urine but no jaundice. An ultrasound showed gallstones. Normal common duct. Fatty liver. Lab work on April 11, 2017 showed a total bilirubin of 1.9, AST 409. ALT 283. Lipase normal. Lab work on August 13 shows AST back to normal. ALT 117. Total bilirubin 0.7.lab work last week was normal.  It is possible that she passed a CBD stone.      She describes her pain as intermittent and not necessarily related to meals epigastric and right upper quadrant. Recent onset. She does have chronic diarrhea with this, a little bit worse recently she saw Dr. [name] on April 25, 2017.  Comorbidities include ovarian cancer. Underwent open hysterectomy, BSO, omentectomy, and appendectomy in 2010 by Dr. [name]. She is followed by Dr. Fermin Schwab in [place]. She was discharged from Dr. [name] care. She apparently has no evidence of disease. BMI 32. Irritable bowel syndrome. Mild hypertension. Some malabsorption. No history of heart disease, lung disease, neurovascular disease or diabetes.        I advised her to undergo laparoscopic cholecystectomy with cholangiogram. She is aware that she is at risk for recurrent problems due to the biliary colic and the elevated LFTs. She is aware that she may have passed a common duct stone. She wants to have this surgery done.  Operative Findings:       There was no evidence of recurrent cancer.  She had extensive adhesions throughout the abdomen.  This involved fatty  tissue and loops of colon and small bowel and stomach.  We were  able to safely put a 5 mm port in the left upper quadrant without injury and it slowly take adhesions down under direct vision until we had two trochars in the right upper quadrant, 1 in the epigastrium, 1 in the left subcostal region, and one in the midline just above the umbilicus.  The adhesions took 45 minutes to take down and was very tedious but uneventful.the gallbladder was chronically inflamed, discolored, fairly extensive adhesions to it which were soft and came down without much difficulty.  The cholangiogram was normal showing normal intrahepatic and extrahepatic biliary anatomy, no filling defect, no deformity, and no obstruction with good flow of contrast into the duodenum.  The liver and diaphragms looked healthy.  Procedure in Detail:         Following the induction of general endotracheal anesthesia the patient's abdomen was prepped and draped in a sterile fashion.  Surgical timeout was performed and intravenous antibiotics were given.  0.5% Marcaine with epinephrine was used as local  infiltration anesthetic.  I made a small incision in the left subcostal region, midclavicular line.  I passed a Veress needle into the abdominal cavity without difficulty and after testing hooked this up to the insufflator and the abdomen was insufflated and pneumoperitoneum was created without difficulty.  Video camera was inserted.  There was no bleeding or sign of intestinal injury.  Extensive adhesions were noted as above.  I was able to place a 5 mm trocar medial to the camera port and through these 2 trocars slowly took some adhesions down off of the falciform ligament and created a window into the right upper quadrant.  Dr. Excell Seltzer placed 25 mm trochars in the right upper quadrant after we took down some adhesions.  We then put the camera on the right side and took more adhesions down slowly taking these down off of the midline.  We ultimately had clean off the area behind the umbilicus safely.  We checked several times and found no evidence of intestinal injury.  I made a vertical incision above the umbilicus and placed an 11 mm trocar just above the umbilicus.  All trochars were inserted under direct vision.     The gallbladder fundus was elevated with graspers.  Adhesions were taken down off of the infundibulum and the cystic duct and the cystic artery.  A window was created behind the cystic duct.  Cholangiogram catheter was inserted into the cystic duct.  Cholangiogram was obtained using the C-arm and was normal as described above.  The cholangiogram catheter was removed, the cystic duct secured with multiple medical clips and divided.  Cystic artery was controlled metal clips and divided.  Gallbladder was dissected from its bed with electrocautery placed in a specimen bag and removed.     We irrigated the operative field.  There was no bleeding or bile leak.  We checked for intestinal injury from both the right side and the left side and found none.  The fascia at the umbilicus was closed with 3  interrupted sutures of 0 Vicryl.  Pneumoperitoneum was released and the trocars were removed.  The skin incisions were closed with subcuticular 4-0 Monocryl suture and Dermabond.  The patient tolerated the procedure well was taken to PACU in stable condition.  EBL 25 mL.  Counts correct.  Complications none.     Edsel Petrin. Dalbert Batman, M.D., FACS General and Minimally Invasive Surgery Breast and Colorectal Surgery   Addendum: I logged onto the [facility]  website and reviewed her  prescription medication history  05/15/2017 4:41 PM

## 2017-05-15 NOTE — Transfer of Care (Signed)
Immediate Anesthesia Transfer of Care Note  Patient: [name]  Procedure(s) Performed: Procedure(s): LAPAROSCOPIC CHOLECYSTECTOMY WITH INTRAOPERATIVE CHOLANGIOGRAM AND LYSIS OF ADHESIONS (N/A)  Patient Location: PACU  Anesthesia Type:General  Level of Consciousness:  sedated, patient cooperative and responds to stimulation  Airway & Oxygen Therapy:Patient Spontanous Breathing and Patient connected to face mask oxgen  Post-op Assessment:  Report given to PACU RN and Post -op Vital signs reviewed and stable  Post vital signs:  Reviewed and stable  Last Vitals:  Vitals:   05/15/17 1235  BP: 139/62  Pulse: 66  Resp: 16  Temp: 37.2 C  SpO2: 95%    Complications: No apparent anesthesia complications

## 2017-05-15 NOTE — Interval H&P Note (Signed)
History and Physical Interval Note:  05/15/2017 1:16 PM  [name]  has presented today for surgery, with the diagnosis of gallstones  The various methods of treatment have been discussed with the patient and family. After consideration of risks, benefits and other options for treatment, the patient has consented to  Procedure(s): LAPAROSCOPIC/ POSSIBLE OPEN CHOLECYSTECTOMY WITH INTRAOPERATIVE CHOLANGIOGRAM (N/A) as a surgical intervention .  The patient's history has been reviewed, patient examined, no change in status, stable for surgery.  I have reviewed the patient's chart and labs.  Questions were answered to the patient's satisfaction.     [name]

## 2017-05-15 NOTE — Anesthesia Preprocedure Evaluation (Addendum)
Anesthesia Evaluation  Patient identified by MRN, date of birth, ID band Patient awake    Reviewed: Allergy & Precautions, NPO status , Patient's Chart, lab work & pertinent test results  Airway Mallampati: II  TM Distance: >3 FB Neck ROM: Full    Dental no notable dental hx.    Pulmonary neg pulmonary ROS,    Pulmonary exam normal breath sounds clear to auscultation       Cardiovascular hypertension, Pt. on medications Normal cardiovascular exam Rhythm:Regular Rate:Normal  ECG: SR, rate 68  LVEF 55-60%, mild LVH, normal wall motion, diastolic dysfunction, elevated LV filling pressure, aortic valve sclerosis with mild AI, trivial MR, normal LA size, mild TR, RVSP 33 mmHg, normal IVC.   Neuro/Psych Anxiety  Neuromuscular disease negative neurological ROS     GI/Hepatic Neg liver ROS, GERD  ,IBS (irritable bowel syndrome)   Endo/Other  negative endocrine ROS  Renal/GU negative Renal ROS     Musculoskeletal negative musculoskeletal ROS (+)   Abdominal   Peds  Hematology  (+) anemia ,   Anesthesia Other Findings   Reproductive/Obstetrics                            Anesthesia Physical Anesthesia Plan  ASA: II  Anesthesia Plan: General   Post-op Pain Management:    Induction: Intravenous  PONV Risk Score and Plan: 4 or greater and Ondansetron, Dexamethasone, Midazolam, Scopolamine patch - Pre-op and Propofol infusion  Airway Management Planned: Oral ETT  Additional Equipment:   Intra-op Plan:   Post-operative Plan: Extubation in OR  Informed Consent: I have reviewed the patients History and Physical, chart, labs and discussed the procedure including the risks, benefits and alternatives for the proposed anesthesia with the patient or authorized representative who has indicated his/her understanding and acceptance.   Dental advisory given  Plan Discussed with: CRNA,  Anesthesiologist and Surgeon  Anesthesia Plan Comments:        Anesthesia Quick Evaluation

## 2017-05-15 NOTE — Anesthesia Procedure Notes (Signed)
Procedure Name: Intubation Date/Time: 05/15/2017 2:42 PM Performed by: Glory Buff Pre-anesthesia Checklist: Patient identified, Emergency Drugs available, Suction available and Patient being monitored Patient Re-evaluated:Patient Re-evaluated prior to induction Oxygen Delivery Method: Circle system utilized Preoxygenation: Pre-oxygenation with 100% oxygen Induction Type: IV induction Ventilation: Mask ventilation without difficulty Laryngoscope Size: Miller and 2 Grade View: Grade I Tube type: Oral Tube size: 7.5 mm Number of attempts: 1 Airway Equipment and Method: Stylet and Oral airway Placement Confirmation: ETT inserted through vocal cords under direct vision,  positive ETCO2 and breath sounds checked- equal and bilateral Secured at: 23 cm Tube secured with: Tape Dental Injury: Teeth and Oropharynx as per pre-operative assessment

## 2017-05-15 NOTE — Anesthesia Postprocedure Evaluation (Signed)
Anesthesia Post Note  Patient: [name]  Procedure(s) Performed: Procedure(s) (LRB): LAPAROSCOPIC CHOLECYSTECTOMY WITH INTRAOPERATIVE CHOLANGIOGRAM AND LYSIS OF ADHESIONS (N/A)     Patient location during evaluation: PACU Anesthesia Type: General Level of consciousness: awake Pain management: pain level controlled Vital Signs Assessment: post-procedure vital signs reviewed and stable Respiratory status: spontaneous breathing Cardiovascular status: stable Anesthetic complications: no    Last Vitals:  Vitals:   05/15/17 1730 05/15/17 1755  BP: (!) 133/58 (!) 133/102  Pulse: 75 (!) 56  Resp: 16 16  Temp: (!) 36.4 C 36.5 C  SpO2: 100% 100%    Last Pain:  Vitals:   05/15/17 1730  TempSrc:   PainSc: 3                  Jarl Sellitto

## 2017-05-16 DIAGNOSIS — F419 Anxiety disorder, unspecified: Secondary | ICD-10-CM | POA: Diagnosis not present

## 2017-05-16 DIAGNOSIS — K8010 Calculus of gallbladder with chronic cholecystitis without obstruction: Secondary | ICD-10-CM | POA: Diagnosis not present

## 2017-05-16 DIAGNOSIS — Z8543 Personal history of malignant neoplasm of ovary: Secondary | ICD-10-CM | POA: Diagnosis not present

## 2017-05-16 DIAGNOSIS — K660 Peritoneal adhesions (postprocedural) (postinfection): Secondary | ICD-10-CM | POA: Diagnosis not present

## 2017-05-16 DIAGNOSIS — K219 Gastro-esophageal reflux disease without esophagitis: Secondary | ICD-10-CM | POA: Diagnosis not present

## 2017-05-16 DIAGNOSIS — I10 Essential (primary) hypertension: Secondary | ICD-10-CM | POA: Diagnosis not present

## 2017-05-16 NOTE — Discharge Summary (Signed)
Patient ID: [name] [id number] 71 y.o. 26-Oct-1945  Admit date: 05/15/2017  Discharge date and time: 05/16/2017  Admitting Physician: [name]  Discharge Physician: [name]  Admission Diagnoses: gallstones  Discharge Diagnoses: Chronic cholecystitis with cholelithiasis                                          Abnormal liver function tests, resolved                                          History of ovarian cancer in adulthood                                          History of total abdominal hysterectomy, bilateral cell finger oophorectomy, and omentectomy                                          Chronic diarrhea                                          BMI 32  Operations: Procedure(s): LAPAROSCOPIC CHOLECYSTECTOMY WITH INTRAOPERATIVE CHOLANGIOGRAM AND LYSIS OF ADHESIONS  Admission Condition: good  Discharged Condition: good  Indication for Admission: This is a very pleasant 71 year old female who is referred by [name] and Dr. [name] for management of symptomatic gallstones.  I evaluated her in the office recently and she is brought to the operating room electively.  The patient has had some vague stomach problems. She underwent colonoscopy about a month ago and they found benign tubular adenomas. A few days later she was having epigastric and right upper quadrant pain without nausea or vomiting. She has chronic diarrhea. She noted dark urine but no jaundice. An ultrasound showed gallstones. Normal common duct. Fatty liver. Lab work on April 11, 2017 showed a total bilirubin of 1.9, AST 409. ALT 283. Lipase normal. Lab work on August 13 shows AST back to normal. ALT 117. Total bilirubin 0.7.lab work last week was normal.  It is possible that she passed a CBD stone. She describes her pain as intermittent and not necessarily related to meals epigastric and right upper quadrant. Recent onset. She does have chronic diarrhea with this, a  little bit worse recently she saw Dr. [name] on April 25, 2017. Comorbidities include ovarian cancer. Underwent open hysterectomy, BSO, omentectomy, and appendectomy in 2010 by Dr. [name]. She is followed by Dr. Fermin Schwab in [place]. She was discharged from Dr. [name] care. She apparently has no evidence of disease. BMI 32. Irritable bowel syndrome. Mild hypertension. Some malabsorption. No history of heart disease, lung disease, neurovascular disease or diabetes. I advised her to undergo laparoscopic cholecystectomy with cholangiogram. She is aware that she is at risk for recurrent problems due to the biliary colic and the elevated LFTs. She is aware that she may have passed a common duct stone. She wants to have this surgery done.  Hospital Course: On the day of admission the patient was taken to  the operating room.  She underwent diagnostic laparoscopy, lengthy lysis of adhesions, laparoscopic cholecystectomy, intraoperative cholangiogram.     Intraoperative findings were: There was no evidence of recurrent cancer.  She had extensive adhesions throughout the abdomen.  This involved fatty  tissue and loops of colon and small bowel and stomach.  We were able to safely put a 5 mm port in the left upper quadrant without injury and it slowly take adhesions down under direct vision until we had two trochars in the right upper quadrant, 1 in the epigastrium, 1 in the left subcostal region, and one in the midline just above the umbilicus.  The adhesions took 45 minutes to take down and was very tedious but uneventful.the gallbladder was chronically inflamed, discolored, fairly extensive adhesions to it which were soft and came down without much difficulty.  The cholangiogram was normal showing normal intrahepatic and extrahepatic biliary anatomy, no filling defect, no deformity, and no obstruction with good flow of contrast into the duodenum.  The liver and diaphragms  looked healthy.     The surgery was completed late in the day.  Because of that and the extent of adhesio lysis we elected to keep her overnight.  She had one episode of low volume emesis and then her nausea resolved.  On postop day she looked very good.  She was tolerating liquids and was ready to advance her diet.  She was ambulating to the bathroom and having trouble voiding.  Her vital signs were stable.  Examination on postop day 1 revealed her abdomen is soft.  Not distended.  Active bowel sounds.  Minimally tender.  All trocar sites looked good.  Plan was tabulated hall, advance diet, and discharge home later in the day.     Diet and activities were discussed.  She was to continue all of her usual medications.  She had hydrocodone prescription at home and so no new prescriptions were written.  Return to see me in the office in about 3 weeks.  Consults: None  Significant Diagnostic Studies: Surgical pathology, pending  Treatments: surgery: Laparoscopic lysis of adhesions, laparoscopic cholecystectomy, intraoperative cholangiogram  Disposition: Home  Patient Instructions:  Allergies as of 05/16/2017      Reactions   Actonel [risedronate Sodium]    Upset stomach   Boniva [ibandronate Sodium]    cramp   Calcium Channel Blockers    Upset stomach   Compazine    Daughter reacts to compazine/pt does not want to take   Lyrica [pregabalin]    Dizzy   Tape    redness      Medication List    TAKE these medications   aspirin 81 MG EC tablet Take 81 mg by mouth daily.   Cholecalciferol 2000 units Tabs Commonly known as:  VITAMIN D3 SUPER STRENGTH Take 2 tablets (4,000 Units total) by mouth every morning. What changed:  when to take this   denosumab 60 MG/ML Soln injection Commonly known as:  PROLIA Inject 60 mg into the skin every 6 (six) months. Administer in upper arm, thigh, or abdomen   diphenoxylate-atropine 2.5-0.025 MG tablet Commonly known as:  LOMOTIL Take 1 tablet by  mouth 4 (four) times daily as needed for diarrhea or loose stools.   hydrALAZINE 25 MG tablet Commonly known as:  APRESOLINE Take one tablet (25 mg) by mouth every 6 hours as needed for elevated blood pressure   HYDROcodone-acetaminophen 5-325 MG tablet Commonly known as:  NORCO/VICODIN Take 1 tablet by mouth at bedtime  as needed for moderate pain.   Hyoscyamine Sulfate SL 0.125 MG Subl Commonly known as:  LEVSIN/SL Dissolve 1 tab on the tongue every 6 hours as needed for pain.   LUMIGAN 0.01 % Soln Generic drug:  bimatoprost Place 1 drop into both eyes at bedtime.   Magnesium Oxide 400 MG Caps Take 1 capsule (400 mg total) by mouth 2 (two) times daily.   NASCOBAL 500 MCG/0.1ML Soln Generic drug:  Cyanocobalamin USE ONE SPRAY NASALLY ONCE  A WEEK   RABEprazole 20 MG tablet Commonly known as:  ACIPHEX Take 1 tablet (20 mg total) by mouth daily.   timolol 0.5 % ophthalmic gel-forming Commonly known as:  TIMOPTIC-XR Place 1 drop into both eyes daily.   triamcinolone cream 0.1 % Commonly known as:  KENALOG Apply 1 application topically 2 (two) times daily as needed (ear bacteria).            Discharge Care Instructions        Start     Ordered   05/16/17 0000  Increase activity slowly     05/16/17 0709   05/16/17 0000  Diet - low sodium heart healthy     05/16/17 0709   05/16/17 0000  Discharge instructions    Comments:  [facility] LAPAROSCOPIC SURGERY: POST OP INSTRUCTIONS Always review your discharge instruction sheet given to you by the facility where your surgery was performed. IF YOU HAVE DISABILITY OR FAMILY LEAVE FORMS, YOU MUST BRING THEM TO THE OFFICE FOR PROCESSING.   DO NOT GIVE THEM TO YOUR DOCTOR.  No prescriptions are given to you at discharge.  You have hydrocodone, or Vicodin at home.  Use that sparingly.  Switch to high-dose Tylenol as soon as possible.  Take your pain medication as prescribed, if needed.  If  narcotic pain medicine is not needed, then you may take acetaminophen (Tylenol) or ibuprofen (Advil) as needed. Take your usually prescribed medications unless otherwise directed. If you need a refill on your pain medication, please contact your pharmacy.  They will contact our office to request authorization. Prescriptions will not be filled after 5pm or on week-ends. You should follow a light diet the first few days after arrival home, such as soup and crackers, etc.  Be sure to include lots of fluids daily. Most patients will experience some swelling and bruising in the area of the incisions.  Ice packs will help.  Swelling and bruising can take several days to resolve.  It is common to experience some constipation if taking pain medication after surgery.  Increasing fluid intake and taking a stool softener (such as Colace) will usually help or prevent this problem from occurring.  A mild laxative (Milk of Magnesia or Miralax) should be taken according to package instructions if there are no bowel movements after 48 hours. Unless discharge instructions indicate otherwise, you may remove your bandages 24-48 hours after surgery, and you may shower at that time.  You may have steri-strips (small skin tapes) in place directly over the incision.  These strips should be left on the skin for 7-10 days.  If your surgeon used skin glue on the incision, you may shower in 24 hours.  The glue will flake off over the next 2-3 weeks.  Any sutures or staples will be removed at the office during your follow-up visit. ACTIVITIES:  You may resume regular (light) daily activities beginning the next day-such as daily self-care, walking, climbing stairs-gradually increasing activities as tolerated.  You may have sexual intercourse when it is comfortable.  Refrain from any heavy lifting or straining until approved by your doctor. You may drive when you are no longer taking prescription pain medication, you can comfortably wear  a seatbelt, and you can safely maneuver your car and apply brakes. RETURN TO WORK:  __________________________________________________________ Dennis Bast should see your doctor in the office for a follow-up appointment approximately 2-3 weeks after your surgery.  Make sure that you call for this appointment within a day or two after you arrive home to insure a convenient appointment time. OTHER INSTRUCTIONS: __________________________________________________________________________________________________________________________ __________________________________________________________________________________________________________________________ WHEN TO CALL YOUR DOCTOR: Fever over 101.0 Inability to urinate Continued bleeding from incision. Increased pain, redness, or drainage from the incision. Increasing abdominal pain  The clinic staff is available to answer your questions during regular business hours.  Please don't hesitate to call and ask to speak to one of the nurses for clinical concerns.  If you have a medical emergency, go to the nearest emergency room or call 911.  A surgeon from [facility] is always on call at the hospital. [address], [place], [place], [place]  [address] ? [address], [place],    [address] [phone] ? [phone] ? FAX [phone] Web site: [email or web address]   05/16/17 0709   05/16/17 0000  May shower / [place]     05/16/17 0709   05/16/17 0000  May walk up steps     05/16/17 0709   05/16/17 0000  Driving Restrictions    Comments:  2-4 days   05/16/17 0709   05/16/17 0000  Lifting restrictions    Comments:  20 lbs for 3-4 weeks   05/16/17 0709   05/16/17 0000  No wound care     05/16/17 0709   05/16/17 0000  Call MD for:  persistant dizziness or light-headedness     05/16/17 0709   05/16/17 0000  Call MD for:  hives     05/16/17 0709   05/16/17 0000  Call MD for:  difficulty breathing, headache or visual  disturbances     05/16/17 0709   05/16/17 0000  Call MD for:  redness, tenderness, or signs of infection (pain, swelling, redness, odor or green/yellow discharge around incision site)     05/16/17 0709   05/16/17 0000  Call MD for:  severe uncontrolled pain     05/16/17 0709   05/16/17 0000  Call MD for:  persistant nausea and vomiting     05/16/17 0709   05/16/17 0000  Call MD for:  temperature >100.4     05/16/17 0709      Activity: Adulate frequently.  No heavy lifting for 3-4 weeks.  Okay to drive in 3-4 days. Diet: low fat, low cholesterol diet Wound Care: none needed  Follow-up:  With Dr. Dalbert Batman in 3 weeks.  Signed: Edsel Petrin. Dalbert Batman, M.D., FACS General and minimally invasive surgery Breast and Colorectal Surgery  05/16/2017, 7:09 AM

## 2017-05-18 ENCOUNTER — Telehealth: Payer: Self-pay | Admitting: *Deleted

## 2017-05-18 ENCOUNTER — Other Ambulatory Visit: Payer: Self-pay | Admitting: Internal Medicine

## 2017-05-18 NOTE — Telephone Encounter (Signed)
Pt was on TCM list admitted 05/15/17 for Chronic cholecystitis with cholelithiasis and abnormal liver function tests, resolved.LAPAROSCOPIC CHOLECYSTECTOMY WITH INTRAOPERATIVE CHOLANGIOGRAM AND LYSIS OF ADHESIONS was done. Pt was D/c 05/16/17 will be f/u w/Dr. Dalbert Batman in 3 weeks...[name]

## 2017-08-02 ENCOUNTER — Encounter: Payer: Self-pay | Admitting: Internal Medicine

## 2017-08-02 ENCOUNTER — Ambulatory Visit (INDEPENDENT_AMBULATORY_CARE_PROVIDER_SITE_OTHER): Payer: Medicare Other | Admitting: Internal Medicine

## 2017-08-02 ENCOUNTER — Other Ambulatory Visit (INDEPENDENT_AMBULATORY_CARE_PROVIDER_SITE_OTHER): Payer: Medicare Other

## 2017-08-02 ENCOUNTER — Other Ambulatory Visit: Payer: Self-pay | Admitting: Internal Medicine

## 2017-08-02 VITALS — BP 132/78 | HR 66 | Temp 97.8°F | Ht 63.0 in | Wt 185.0 lb

## 2017-08-02 DIAGNOSIS — R1013 Epigastric pain: Secondary | ICD-10-CM

## 2017-08-02 DIAGNOSIS — D649 Anemia, unspecified: Secondary | ICD-10-CM | POA: Diagnosis not present

## 2017-08-02 DIAGNOSIS — K819 Cholecystitis, unspecified: Secondary | ICD-10-CM

## 2017-08-02 DIAGNOSIS — R1084 Generalized abdominal pain: Secondary | ICD-10-CM

## 2017-08-02 DIAGNOSIS — E876 Hypokalemia: Secondary | ICD-10-CM | POA: Diagnosis not present

## 2017-08-02 DIAGNOSIS — G8929 Other chronic pain: Secondary | ICD-10-CM

## 2017-08-02 DIAGNOSIS — E538 Deficiency of other specified B group vitamins: Secondary | ICD-10-CM

## 2017-08-02 DIAGNOSIS — R739 Hyperglycemia, unspecified: Secondary | ICD-10-CM | POA: Diagnosis not present

## 2017-08-02 DIAGNOSIS — Z23 Encounter for immunization: Secondary | ICD-10-CM | POA: Diagnosis not present

## 2017-08-02 LAB — URINALYSIS, ROUTINE W REFLEX MICROSCOPIC
BILIRUBIN URINE: NEGATIVE
Ketones, ur: NEGATIVE
Nitrite: NEGATIVE
PH: 6 (ref 5.0–8.0)
Specific Gravity, Urine: >=1.03 — AB (ref 1.000–1.030)
TOTAL PROTEIN, URINE-UPE24: NEGATIVE
UROBILINOGEN UA: 0.2 (ref 0.0–1.0)
Urine Glucose: NEGATIVE

## 2017-08-02 LAB — HEPATIC FUNCTION PANEL
ALK PHOS: 70 U/L (ref 39–117)
ALT: 17 U/L (ref 0–35)
AST: 18 U/L (ref 0–37)
Albumin: 4.2 g/dL (ref 3.5–5.2)
BILIRUBIN DIRECT: 0.1 mg/dL (ref 0.0–0.3)
BILIRUBIN TOTAL: 0.7 mg/dL (ref 0.2–1.2)
Total Protein: 7.6 g/dL (ref 6.0–8.3)

## 2017-08-02 LAB — BASIC METABOLIC PANEL
BUN: 16 mg/dL (ref 6–23)
CHLORIDE: 104 meq/L (ref 96–112)
CO2: 28 mEq/L (ref 19–32)
CREATININE: 0.82 mg/dL (ref 0.40–1.20)
Calcium: 9.4 mg/dL (ref 8.4–10.5)
GFR: 72.91 mL/min (ref >60.00)
Glucose, Bld: 107 mg/dL — ABNORMAL HIGH (ref 70–99)
POTASSIUM: 3.3 meq/L — AB (ref 3.5–5.1)
SODIUM: 141 meq/L (ref 135–145)

## 2017-08-02 LAB — IRON,TIBC AND FERRITIN PANEL
%SAT: 15 % (calc) (ref 11–50)
FERRITIN: 67 ng/mL (ref 20–288)
Iron: 52 ug/dL (ref 45–160)
TIBC: 341 mcg/dL (calc) (ref 250–450)

## 2017-08-02 LAB — CBC WITH DIFFERENTIAL/PLATELET
BASOS ABS: 0 10*3/uL (ref 0.0–0.1)
Basophils Relative: 0.7 % (ref 0.0–3.0)
EOS ABS: 0.1 10*3/uL (ref 0.0–0.7)
Eosinophils Relative: 1.7 % (ref 0.0–5.0)
HCT: 33.4 % — ABNORMAL LOW (ref 36.0–46.0)
Hemoglobin: 11.5 g/dL — ABNORMAL LOW (ref 12.0–15.0)
LYMPHS ABS: 1.6 10*3/uL (ref 0.7–4.0)
Lymphocytes Relative: 28.6 % (ref 12.0–46.0)
MCHC: 34.5 g/dL (ref 30.0–36.0)
MCV: 85.1 fl (ref 78.0–100.0)
MONO ABS: 0.5 10*3/uL (ref 0.1–1.0)
Monocytes Relative: 8.8 % (ref 3.0–12.0)
NEUTROS ABS: 3.3 10*3/uL (ref 1.4–7.7)
NEUTROS PCT: 60.2 % (ref 43.0–77.0)
PLATELETS: 197 10*3/uL (ref 150.0–400.0)
RBC: 3.92 Mil/uL (ref 3.87–5.11)
RDW: 13.8 % (ref 11.5–15.5)
WBC: 5.4 10*3/uL (ref 4.0–10.5)

## 2017-08-02 MED ORDER — POTASSIUM CHLORIDE CRYS ER 20 MEQ PO TBCR
20.0000 meq | EXTENDED_RELEASE_TABLET | Freq: Every day | ORAL | 11 refills | Status: DC
Start: 2017-08-02 — End: 2018-04-02

## 2017-08-02 NOTE — Assessment & Plan Note (Signed)
labs

## 2017-08-02 NOTE — Assessment & Plan Note (Signed)
much better post-cholecystectomy

## 2017-08-02 NOTE — Assessment & Plan Note (Signed)
Labs

## 2017-08-02 NOTE — Progress Notes (Signed)
Subjective:  Patient ID: [name], female    DOB: Dec 07, 1945  Age: 71 y.o. MRN: [id number]  CC: No chief complaint on file.   HPI [name] presents for diarrhea - better post-chole by 50% F/u anemia, low K    Outpatient Medications Prior to Visit  Medication Sig Dispense Refill  . aspirin 81 MG EC tablet Take 81 mg by mouth daily.      . bimatoprost (LUMIGAN) 0.01 % SOLN Place 1 drop into both eyes at bedtime.    . Cholecalciferol (VITAMIN D3 SUPER STRENGTH) 2000 UNITS TABS Take 2 tablets (4,000 Units total) by mouth every morning. (Patient taking differently: Take 4,000 Units by mouth 3 (three) times a week. ) 100 each 3  . denosumab (PROLIA) 60 MG/ML SOLN injection Inject 60 mg into the skin every 6 (six) months. Administer in upper arm, thigh, or abdomen    . diphenoxylate-atropine (LOMOTIL) 2.5-0.025 MG tablet Take 1 tablet by mouth 4 (four) times daily as needed for diarrhea or loose stools. 60 tablet 3  . hydrALAZINE (APRESOLINE) 25 MG tablet Take one tablet (25 mg) by mouth every 6 hours as needed for elevated blood pressure 30 tablet 3  . NASCOBAL 500 MCG/0.1ML SOLN USE ONE SPRAY NASALLY ONCE  A WEEK 3 Bottle 3  . RABEprazole (ACIPHEX) 20 MG tablet TAKE 1 TABLET BY MOUTH  DAILY 90 tablet 1  . timolol (TIMOPTIC-XR) 0.5 % ophthalmic gel-forming Place 1 drop into both eyes daily.     [name] triamcinolone cream (KENALOG) 0.1 % Apply 1 application topically 2 (two) times daily as needed (ear bacteria).     [name] HYDROcodone-acetaminophen (NORCO/VICODIN) 5-325 MG tablet Take 1 tablet by mouth at bedtime as needed for moderate pain.    [name] Hyoscyamine Sulfate SL (LEVSIN/SL) 0.125 MG SUBL Dissolve 1 tab on the tongue every 6 hours as needed for pain. 15 each 0  . Magnesium Oxide 400 MG CAPS Take 1 capsule (400 mg total) by mouth 2 (two) times daily. (Patient not taking: Reported on 05/11/2017)  0   No facility-administered medications prior to visit.     ROS Review of Systems    Constitutional: Positive for fatigue. Negative for activity change, appetite change, chills and unexpected weight change.  HENT: Negative for congestion, mouth sores and sinus pressure.   Eyes: Negative for visual disturbance.  Respiratory: Negative for cough and chest tightness.   Gastrointestinal: Positive for abdominal pain and diarrhea. Negative for nausea.  Genitourinary: Positive for dysuria. Negative for difficulty urinating, frequency and vaginal pain.  Musculoskeletal: Positive for arthralgias. Negative for back pain and gait problem.  Skin: Negative for pallor and rash.  Neurological: Negative for dizziness, tremors, weakness, numbness and headaches.  Psychiatric/Behavioral: Negative for confusion and sleep disturbance.    Objective:  BP 132/78 (BP Location: Left Arm, Patient Position: Sitting, Cuff Size: Large)   Pulse 66   Temp 97.8 F (36.6 C) (Oral)   Ht 5\' 3"  (1.6 m)   Wt 185 lb (83.9 kg)   SpO2 99%   BMI 32.77 kg/m   BP Readings from Last 3 Encounters:  08/02/17 132/78  05/16/17 (!) 118/51  05/12/17 (!) 145/44    Wt Readings from Last 3 Encounters:  08/02/17 185 lb (83.9 kg)  05/15/17 185 lb (83.9 kg)  05/12/17 185 lb 6.4 oz (84.1 kg)    Physical Exam  Constitutional: She appears well-developed. No distress.  HENT:  Head: Normocephalic.  Right Ear: External ear normal.  Left Ear: External ear normal.  Nose: Nose normal.  Mouth/Throat: Oropharynx is clear and moist.  Eyes: Conjunctivae are normal. Pupils are equal, round, and reactive to light. Right eye exhibits no discharge. Left eye exhibits no discharge.  Neck: Normal range of motion. Neck supple. No JVD present. No tracheal deviation present. No thyromegaly present.  Cardiovascular: Normal rate, regular rhythm and normal heart sounds.  Pulmonary/Chest: No stridor. No respiratory distress. She has no wheezes.  Abdominal: Soft. Bowel sounds are normal. She exhibits no distension and no mass. There is  no tenderness. There is no rebound and no guarding.  Musculoskeletal: She exhibits no edema or tenderness.  Lymphadenopathy:    She has no cervical adenopathy.  Neurological: She displays normal reflexes. No cranial nerve deficit. She exhibits normal muscle tone. Coordination normal.  Skin: No rash noted. No erythema.  Psychiatric: She has a normal mood and affect. Her behavior is normal. Judgment and thought content normal.   Obese  Lab Results  Component Value Date   WBC 9.0 05/15/2017   HGB 10.9 (L) 05/15/2017   HCT 32.4 (L) 05/15/2017   PLT 171 05/15/2017   GLUCOSE 100 (H) 05/12/2017   CHOL 170 09/26/2016   TRIG 148.0 09/26/2016   HDL 40.10 09/26/2016   LDLCALC 101 (H) 09/26/2016   ALT 20 05/12/2017   AST 21 05/12/2017   NA 140 05/12/2017   K 3.3 (L) 05/12/2017   CL 106 05/12/2017   CREATININE 0.80 05/15/2017   BUN 17 05/12/2017   CO2 25 05/12/2017   TSH 1.530 11/28/2016   HGBA1C 6.0 02/24/2017    No results found.  Assessment & Plan:   Diagnoses and all orders for this visit:  Need for influenza vaccination -     Flu Vaccine QUAD 6+ mos PF IM (Fluarix Quad PF)   I have discontinued Brandi Bears L. Cuthbert "KAY"'s Magnesium Oxide, Hyoscyamine Sulfate SL, and HYDROcodone-acetaminophen. I am also having her maintain her aspirin, timolol, denosumab, Cholecalciferol, diphenoxylate-atropine, hydrALAZINE, triamcinolone cream, bimatoprost, NASCOBAL, and RABEprazole.  No orders of the defined types were placed in this encounter.    Follow-up: No Follow-up on file.  Walker Kehr, MD

## 2017-08-02 NOTE — Assessment & Plan Note (Signed)
On B12 

## 2017-08-02 NOTE — Assessment & Plan Note (Signed)
UA

## 2017-08-02 NOTE — Assessment & Plan Note (Signed)
diarrhea - better post-chole by 50%

## 2017-08-17 ENCOUNTER — Other Ambulatory Visit (INDEPENDENT_AMBULATORY_CARE_PROVIDER_SITE_OTHER): Payer: Medicare Other

## 2017-08-17 DIAGNOSIS — R7301 Impaired fasting glucose: Secondary | ICD-10-CM | POA: Diagnosis not present

## 2017-08-17 LAB — BASIC METABOLIC PANEL
BUN: 14 mg/dL (ref 6–23)
CO2: 27 meq/L (ref 19–32)
CREATININE: 0.78 mg/dL (ref 0.40–1.20)
Calcium: 8.9 mg/dL (ref 8.4–10.5)
Chloride: 106 mEq/L (ref 96–112)
GFR: 77.23 mL/min (ref >60.00)
Glucose, Bld: 112 mg/dL — ABNORMAL HIGH (ref 70–99)
Potassium: 3.6 mEq/L (ref 3.5–5.1)
Sodium: 142 mEq/L (ref 135–145)

## 2017-08-17 LAB — HEMOGLOBIN A1C: Hgb A1c MFr Bld: 6 % (ref 4.6–6.5)

## 2017-08-21 ENCOUNTER — Telehealth: Payer: Self-pay | Admitting: *Deleted

## 2017-08-21 NOTE — Telephone Encounter (Signed)
Attempted to return the patient, no answer.

## 2017-08-22 ENCOUNTER — Ambulatory Visit (INDEPENDENT_AMBULATORY_CARE_PROVIDER_SITE_OTHER): Payer: Medicare Other

## 2017-08-22 DIAGNOSIS — M810 Age-related osteoporosis without current pathological fracture: Secondary | ICD-10-CM

## 2017-08-22 MED ORDER — DENOSUMAB 60 MG/ML ~~LOC~~ SOLN
60.0000 mg | Freq: Once | SUBCUTANEOUS | Status: AC
  Administered 2017-08-22: 60 mg via SUBCUTANEOUS

## 2017-08-23 ENCOUNTER — Ambulatory Visit (INDEPENDENT_AMBULATORY_CARE_PROVIDER_SITE_OTHER): Payer: Medicare Other | Admitting: Endocrinology

## 2017-08-23 ENCOUNTER — Encounter: Payer: Self-pay | Admitting: Endocrinology

## 2017-08-23 VITALS — BP 140/86 | HR 78 | Ht 63.0 in | Wt 185.0 lb

## 2017-08-23 DIAGNOSIS — R7301 Impaired fasting glucose: Secondary | ICD-10-CM

## 2017-08-23 DIAGNOSIS — E785 Hyperlipidemia, unspecified: Secondary | ICD-10-CM

## 2017-08-23 NOTE — Progress Notes (Signed)
Patient ID: [name], female   DOB: [birth year], 70 y.o.   MRN: [id number]           Reason for Appointment: Follow up of prediabetes  Referring physician: [name]  History of Present Illness:          She has prediabetes and also was told to have background retinopathy  On her periodic general checkups she has had an A1c done regularly and these have been upper normal around 6.0  She was evaluated with a glucose tolerance test in 10/2015 which showed the following: Fasting glucose 122, one-hour glucose 202 hour glucose 147  She was referred to the dietitian for meal planning in 3/17  Recent history:  Her A1c is still the same at 6%  Fasting glucose is 112, previously 122 She has lost 3-4 pounds but this may be partly related to her having cholecystectomy 3 months ago She says occasionally she will have lightheadedness which is a woozy feeling midmorning but relieved by crackers and juice She now says that she is eating cereal and fruit and sometimes toast also in the morning at breakfast instead of protein She has not done any exercise despite reminders Again has been generally reluctant to consider medications for her prediabetes  She was asked to join the diabetes prevention program at the [facility] last time but she did not do so   Weight history:  Wt Readings from Last 3 Encounters:  08/23/17 185 lb (83.9 kg)  08/02/17 185 lb (83.9 kg)  05/15/17 185 lb (83.9 kg)    Glycemic levels:   Lab Results  Component Value Date   HGBA1C 6.0 08/17/2017   HGBA1C 6.0 02/24/2017   HGBA1C 6.0 09/26/2016   Lab Results  Component Value Date   LDLCALC 101 (H) 09/26/2016   CREATININE 0.78 08/17/2017       Allergies as of 08/23/2017      Reactions   Actonel [risedronate Sodium]    Upset stomach   Boniva [ibandronate Sodium]    cramp   Calcium Channel Blockers    Upset stomach   Compazine    Daughter reacts to compazine/pt does not want to take   Lyrica  [pregabalin]    Dizzy   Tape    redness      Medication List        Accurate as of 08/23/17 10:32 AM. Always use your most recent med list.          aspirin 81 MG EC tablet Take 81 mg by mouth daily.   Cholecalciferol 2000 units Tabs Commonly known as:  VITAMIN D3 SUPER STRENGTH Take 2 tablets (4,000 Units total) by mouth every morning.   denosumab 60 MG/ML Soln injection Commonly known as:  PROLIA Inject 60 mg into the skin every 6 (six) months. Administer in upper arm, thigh, or abdomen   diphenoxylate-atropine 2.5-0.025 MG tablet Commonly known as:  LOMOTIL Take 1 tablet by mouth 4 (four) times daily as needed for diarrhea or loose stools.   hydrALAZINE 25 MG tablet Commonly known as:  APRESOLINE Take one tablet (25 mg) by mouth every 6 hours as needed for elevated blood pressure   LUMIGAN 0.01 % Soln Generic drug:  bimatoprost Place 1 drop into both eyes at bedtime.   NASCOBAL 500 MCG/0.1ML Soln Generic drug:  Cyanocobalamin USE ONE SPRAY NASALLY ONCE  A WEEK   potassium chloride SA 20 MEQ tablet Commonly known as:  K-DUR,KLOR-CON Take 1 tablet (20 mEq total) by mouth  daily.   RABEprazole 20 MG tablet Commonly known as:  ACIPHEX TAKE 1 TABLET BY MOUTH  DAILY   timolol 0.5 % ophthalmic gel-forming Commonly known as:  TIMOPTIC-XR Place 1 drop into both eyes daily.   triamcinolone cream 0.1 % Commonly known as:  KENALOG Apply 1 application topically 2 (two) times daily as needed (ear bacteria).       Allergies:  Allergies  Allergen Reactions  . Actonel [Risedronate Sodium]     Upset stomach  . Boniva [Ibandronate Sodium]     cramp  . Calcium Channel Blockers     Upset stomach  . Compazine     Daughter reacts to compazine/pt does not want to take  . Lyrica [Pregabalin]     Dizzy   . Tape     redness    Past Medical History:  Diagnosis Date  . Adenomatous colon polyp 04/08/2011  . Anemia   . Anxiety   . Cataract   . GERD  (gastroesophageal reflux disease)   . Glaucoma (increased eye pressure)   . Heart murmur   . HTN (hypertension)   . IBS (irritable bowel syndrome)   . LBP (low back pain)   . Menopause   . Osteoporosis   . Ovarian cancer ([place]) 09/2008   Dr Gwyneth Revels  . Pancreatic cyst   . Vitamin B12 deficiency   . Vitamin D deficiency     Past Surgical History:  Procedure Laterality Date  . ABDOMINAL HYSTERECTOMY    . APPENDECTOMY    . CHOLECYSTECTOMY N/A 05/15/2017   Procedure: LAPAROSCOPIC CHOLECYSTECTOMY WITH INTRAOPERATIVE CHOLANGIOGRAM AND LYSIS OF ADHESIONS;  Surgeon: Fanny Skates, MD;  Location: [facility];  Service: General;  Laterality: N/A;  . ELBOW FRACTURE SURGERY     age 17- left elbow  . [place]  2001  . Ovarian Cancer Debulking  09/2008    Family History  Problem Relation Age of Onset  . Alzheimer's disease Mother 2  . Other Mother 43       TAH for excessive bleeding  . Lung cancer Father        smoker; metastasis to stomach and other areas  . Heart attack Maternal Uncle   . Other Paternal Aunt        stomach issues  . Heart Problems Paternal Uncle   . Other Maternal Grandmother        stomach issues; +hysterectomy  . Heart attack Maternal Grandfather   . Infertility Daughter   . Stomach cancer Cousin        dx. mid-60s  . Other Cousin        stomach issues  . Leukemia Cousin 18  . Stomach cancer Other   . Cancer Cousin        unknown type  . Other Cousin        stomach issues  . Heart Problems Maternal Uncle   . Diabetes Paternal Aunt   . Heart Problems Paternal Uncle   . Emphysema Paternal Uncle        work exposure  . Breast cancer Cousin        dx. late 60s-early 70s  . Colon cancer Neg Hx   . Rectal cancer Neg Hx   . Esophageal cancer Neg Hx     Social History:  reports that  has never smoked. she has never used smokeless tobacco. She reports that she does not drink alcohol or use drugs.    Review of Systems   On Prolia from  PCP For  osteopenia, had an injection this month   Lipid history: She has not been on any statin drugs, needs follow-up    Lab Results  Component Value Date   CHOL 170 09/26/2016   HDL 40.10 09/26/2016   LDLCALC 101 (H) 09/26/2016   TRIG 148.0 09/26/2016   CHOLHDL 4 09/26/2016         RETINOPATHY: She was told by her ophthalmologist In 1/18 that her retinopathy changes are relatively better    Review of Systems     LABS:  Lab on 08/17/2017  Component Date Value Ref Range Status  . Sodium 08/17/2017 142  135 - 145 mEq/L Final  . Potassium 08/17/2017 3.6  3.5 - 5.1 mEq/L Final  . Chloride 08/17/2017 106  96 - 112 mEq/L Final  . CO2 08/17/2017 27  19 - 32 mEq/L Final  . Glucose, Bld 08/17/2017 112* 70 - 99 mg/dL Final  . BUN 08/17/2017 14  6 - 23 mg/dL Final  . Creatinine, Ser 08/17/2017 0.78  0.40 - 1.20 mg/dL Final  . Calcium 08/17/2017 8.9  8.4 - 10.5 mg/dL Final  . GFR 08/17/2017 77.23  >60.00 mL/min Final  . Hgb A1c MFr Bld 08/17/2017 6.0  4.6 - 6.5 % Final   Glycemic Control Guidelines for People with Diabetes:Non Diabetic:  <6%Goal of Therapy: <7%Additional Action Suggested:  >8%     Physical Examination:  BP 140/86   Pulse 78   Ht 5\' 3"  (1.6 m)   Wt 185 lb (83.9 kg)   SpO2 98%   BMI 32.77 kg/m         ASSESSMENT/PLAN:  PREDIABETES with obesity:  Her A1c has been around 6% very consistently now Still has high fasting readings, now 112  Again discussed lifestyle changes for weight loss and improving her fasting glucose which is caused by insulin resistance She thinks she can try and start walking with a friend or her husband for exercise Also recommended continuing to restrict calories and high carbohydrate meals For breakfast she will start eating an egg and oatmeal/toast instead of cereal and fruit  Not clear if her lightheaded symptoms midmorning are related to blood sugar swings She has been reluctant to add medications including metformin  HYPERTENSION:  Continue follow-up with PCP  LIPIDS: To have follow-up with PCP  She will follow-up in 6 months again   Elayne Snare 08/23/2017, 10:32 AM   Note: This office note was prepared with Dragon voice recognition system technology. Any transcriptional errors that result from this process are unintentional.

## 2017-08-23 NOTE — Patient Instructions (Signed)
Walk daily  Protein at [facility]

## 2017-09-14 ENCOUNTER — Telehealth: Payer: Self-pay | Admitting: *Deleted

## 2017-09-14 NOTE — Telephone Encounter (Signed)
Patient called and scheduled an appointment to see Dr. Fermin Schwab on February 12th at 10:30am

## 2017-10-02 DIAGNOSIS — H52203 Unspecified astigmatism, bilateral: Secondary | ICD-10-CM | POA: Diagnosis not present

## 2017-10-02 DIAGNOSIS — H40013 Open angle with borderline findings, low risk, bilateral: Secondary | ICD-10-CM | POA: Diagnosis not present

## 2017-10-02 DIAGNOSIS — H2513 Age-related nuclear cataract, bilateral: Secondary | ICD-10-CM | POA: Diagnosis not present

## 2017-10-02 DIAGNOSIS — E113291 Type 2 diabetes mellitus with mild nonproliferative diabetic retinopathy without macular edema, right eye: Secondary | ICD-10-CM | POA: Diagnosis not present

## 2017-10-02 LAB — HM DIABETES EYE EXAM

## 2017-10-17 ENCOUNTER — Encounter: Payer: Self-pay | Admitting: Gynecology

## 2017-10-17 ENCOUNTER — Inpatient Hospital Stay: Payer: Medicare Other | Attending: Gynecology | Admitting: Gynecology

## 2017-10-17 VITALS — BP 130/82 | HR 72 | Temp 97.9°F | Resp 18 | Ht 64.0 in | Wt 189.2 lb

## 2017-10-17 DIAGNOSIS — C5700 Malignant neoplasm of unspecified fallopian tube: Secondary | ICD-10-CM | POA: Insufficient documentation

## 2017-10-17 DIAGNOSIS — R32 Unspecified urinary incontinence: Secondary | ICD-10-CM

## 2017-10-17 NOTE — Patient Instructions (Addendum)
Plan on following up with Dr. Fermin Schwab in one year or sooner if needed.  Please call closer to the date to schedule your appointment.  Please call for any new symptoms, concerns, or questions.  Please contact us if you need a referral to see Dr. Maryland Pink.

## 2017-10-17 NOTE — Progress Notes (Signed)
Consult Note: Gyn-Onc   [name] 72 y.o. female  Chief Complaint  Patient presents with  . Fallopian tube carcinoma, unspecified laterality ([place])   Assessment: Stage III fallopian tube cancer 2010. Clinically free of disease. Chronic diarrhea.  Urinary incontinence  Plan  the patient return to see [place] in one year.  The patient would like to be evaluated for her urinary incontinence and I would refer her to Dr. Maryland Pink.. .  Interval History Since her last visit the patient has done well.  She did require a cholecystectomy within the past year.  She continues to have difficulties feeling lightheaded which are being evaluated by Dr. Dwyane Dee.  The patient readily admits that she has not been living a healthy lifestyle with regard to eating and does not exercise.  She has some chronic diarrhea and is under the care of Dr. Olevia Perches for this problem.she does note that she has urinary incontinence which sounds to be urge incontinence listening to her history.  She wears a perineal pad continually.  She also has a sensation that her pelvis is "falling out"  HPI: Stage III fallopian tube carcinoma undergoing initial surgical debulking February 2010. She was optimally debulked with minimal disease on peritoneal surfaces. She received 6 cycles of carboplatin and Taxol chemotherapy completed in June 2010. He CT scan and a PET scan at that time showed no evidence disease. The patient has been followed since that time with normal exams and CA 125 values.    Review of Systems:10 point review of systems is negative as noted above.   Vitals: Blood pressure 130/82, pulse 72, temperature 97.9 F (36.6 C), temperature source Oral, resp. rate 18, height 5\' 4"  (1.626 m), weight 189 lb 3.2 oz (85.8 kg), SpO2 98 %.  Physical Exam: General : The patient is a healthy woman in no acute distress.  HEENT: normocephalic, extraoccular movements normal; neck is supple without thyromegally  Lynphnodes:  Supraclavicular, axillary and inguinal nodes not enlarged   Breasts are without masses skin changes or nipple discharge. Abdomen: Soft, non-tender, no ascites, no organomegally, no masses, no hernias  Pelvic:  EGBUS: Normal female  Vagina: Patient has a grade 1-2 cystocele and rectocele. Urethra and Bladder: Normal, non-tender, cystocele.  Cervix: Surgically absent  Uterus: Surgically absent  Bi-manual examination: Non-tender; no adenxal masses or nodularity  Rectal: normal sphincter tone, no masses, no blood  Lower extremities: No edema or varicosities. Normal range of motion     Allergies  Allergen Reactions  . Actonel [Risedronate Sodium]     Upset stomach  . Boniva [Ibandronate Sodium]     cramp  . Calcium Channel Blockers     Upset stomach  . Compazine     Daughter reacts to compazine/pt does not want to take  . Lyrica [Pregabalin]     Dizzy   . Tape     redness    Past Medical History:  Diagnosis Date  . Adenomatous colon polyp 04/08/2011  . Anemia   . Anxiety   . Cataract   . GERD (gastroesophageal reflux disease)   . Glaucoma (increased eye pressure)   . Heart murmur   . HTN (hypertension)   . IBS (irritable bowel syndrome)   . LBP (low back pain)   . Menopause   . Osteoporosis   . Ovarian cancer ([place]) 09/2008   Dr Gwyneth Revels  . Pancreatic cyst   . Vitamin B12 deficiency   . Vitamin D deficiency     Past Surgical  History:  Procedure Laterality Date  . ABDOMINAL HYSTERECTOMY    . APPENDECTOMY    . CHOLECYSTECTOMY N/A 05/15/2017   Procedure: LAPAROSCOPIC CHOLECYSTECTOMY WITH INTRAOPERATIVE CHOLANGIOGRAM AND LYSIS OF ADHESIONS;  Surgeon: Fanny Skates, MD;  Location: [facility];  Service: General;  Laterality: N/A;  . ELBOW FRACTURE SURGERY     age 11- left elbow  . [place]  2001  . Ovarian Cancer Debulking  09/2008    Current Outpatient Medications  Medication Sig Dispense Refill  . aspirin 81 MG EC tablet Take 81 mg by mouth daily.      .  bimatoprost (LUMIGAN) 0.01 % SOLN Place 1 drop into both eyes at bedtime.    . Cholecalciferol (VITAMIN D3 SUPER STRENGTH) 2000 UNITS TABS Take 2 tablets (4,000 Units total) by mouth every morning. (Patient taking differently: Take 4,000 Units by mouth 3 (three) times a week. ) 100 each 3  . denosumab (PROLIA) 60 MG/ML SOLN injection Inject 60 mg into the skin every 6 (six) months. Administer in upper arm, thigh, or abdomen    . diphenoxylate-atropine (LOMOTIL) 2.5-0.025 MG tablet Take 1 tablet by mouth 4 (four) times daily as needed for diarrhea or loose stools. 60 tablet 3  . hydrALAZINE (APRESOLINE) 25 MG tablet Take one tablet (25 mg) by mouth every 6 hours as needed for elevated blood pressure 30 tablet 3  . NASCOBAL 500 MCG/0.1ML SOLN USE ONE SPRAY NASALLY ONCE  A WEEK 3 Bottle 3  . potassium chloride SA (K-DUR,KLOR-CON) 20 MEQ tablet Take 1 tablet (20 mEq total) by mouth daily. 30 tablet 11  . RABEprazole (ACIPHEX) 20 MG tablet TAKE 1 TABLET BY MOUTH  DAILY 90 tablet 1  . timolol (TIMOPTIC-XR) 0.5 % ophthalmic gel-forming Place 1 drop into both eyes daily.     [name] triamcinolone cream (KENALOG) 0.1 % Apply 1 application topically 2 (two) times daily as needed (ear bacteria).      No current facility-administered medications for this visit.     Social History   Socioeconomic History  . Marital status: Married    Spouse name: Dominica Severin  . Number of children: 2  . Years of education: Not on file  . Highest education level: Not on file  Social Needs  . Financial resource strain: Not on file  . Food insecurity - worry: Not on file  . Food insecurity - inability: Not on file  . Transportation needs - medical: Not on file  . Transportation needs - non-medical: Not on file  Occupational History  . 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 Smoking status: Never Smoker  . Smokeless tobacco: Never Used  Substance and Sexual Activity  . Alcohol use: No    Alcohol/week: 0.0  oz  . Drug use: No  . Sexual activity: Not on file  Other Topics Concern  . Not on file  Social History Narrative  . Not on file    Family History  Problem Relation Age of Onset  . Alzheimer's disease Mother 82  . Other Mother 46       TAH for excessive bleeding  . Lung cancer Father        smoker; metastasis to stomach and other areas  . Heart attack Maternal Uncle   . Other Paternal Aunt        stomach issues  . Heart Problems Paternal Uncle   . Other Maternal Grandmother        stomach issues; +hysterectomy  .  Heart attack Maternal Grandfather   . Infertility Daughter   . Stomach cancer Cousin        dx. mid-60s  . Other Cousin        stomach issues  . Leukemia Cousin 18  . Stomach cancer Other   . Cancer Cousin        unknown type  . Other Cousin        stomach issues  . Heart Problems Maternal Uncle   . Diabetes Paternal Aunt   . Heart Problems Paternal Uncle   . Emphysema Paternal Uncle        work exposure  . Breast cancer Cousin        dx. late 60s-early 70s  . Colon cancer Neg Hx   . Rectal cancer Neg Hx   . Esophageal cancer Neg Hx       Marti Sleigh, MD 10/17/2017, 12:06 PM

## 2017-10-21 ENCOUNTER — Encounter: Payer: Self-pay | Admitting: Endocrinology

## 2017-10-25 ENCOUNTER — Ambulatory Visit: Payer: Self-pay | Admitting: *Deleted

## 2017-10-25 ENCOUNTER — Ambulatory Visit (INDEPENDENT_AMBULATORY_CARE_PROVIDER_SITE_OTHER): Payer: Medicare Other | Admitting: Family

## 2017-10-25 ENCOUNTER — Other Ambulatory Visit (INDEPENDENT_AMBULATORY_CARE_PROVIDER_SITE_OTHER): Payer: Medicare Other

## 2017-10-25 VITALS — BP 152/92 | HR 94 | Temp 98.0°F | Ht 64.0 in | Wt 185.1 lb

## 2017-10-25 DIAGNOSIS — R8290 Unspecified abnormal findings in urine: Secondary | ICD-10-CM

## 2017-10-25 DIAGNOSIS — R030 Elevated blood-pressure reading, without diagnosis of hypertension: Secondary | ICD-10-CM

## 2017-10-25 DIAGNOSIS — R1012 Left upper quadrant pain: Secondary | ICD-10-CM

## 2017-10-25 LAB — CBC WITH DIFFERENTIAL/PLATELET
Basophils Absolute: 0 10*3/uL (ref 0.0–0.1)
Basophils Relative: 0.4 % (ref 0.0–3.0)
Eosinophils Absolute: 0.1 10*3/uL (ref 0.0–0.7)
Eosinophils Relative: 1.3 % (ref 0.0–5.0)
HCT: 38.6 % (ref 36.0–46.0)
Hemoglobin: 13.1 g/dL (ref 12.0–15.0)
LYMPHS ABS: 1.7 10*3/uL (ref 0.7–4.0)
Lymphocytes Relative: 21.2 % (ref 12.0–46.0)
MCHC: 34.1 g/dL (ref 30.0–36.0)
MCV: 84 fl (ref 78.0–100.0)
Monocytes Absolute: 0.5 10*3/uL (ref 0.1–1.0)
Monocytes Relative: 6.8 % (ref 3.0–12.0)
NEUTROS ABS: 5.6 10*3/uL (ref 1.4–7.7)
NEUTROS PCT: 70.3 % (ref 43.0–77.0)
PLATELETS: 261 10*3/uL (ref 150.0–400.0)
RBC: 4.59 Mil/uL (ref 3.87–5.11)
RDW: 14.3 % (ref 11.5–15.5)
WBC: 8 10*3/uL (ref 4.0–10.5)

## 2017-10-25 LAB — COMPREHENSIVE METABOLIC PANEL
ALT: 18 U/L (ref 0–35)
AST: 17 U/L (ref 0–37)
Albumin: 4.4 g/dL (ref 3.5–5.2)
Alkaline Phosphatase: 70 U/L (ref 39–117)
BUN: 15 mg/dL (ref 6–23)
CO2: 27 meq/L (ref 19–32)
Calcium: 9.8 mg/dL (ref 8.4–10.5)
Chloride: 105 mEq/L (ref 96–112)
Creatinine, Ser: 0.78 mg/dL (ref 0.40–1.20)
GFR: 77.19 mL/min (ref >60.00)
GLUCOSE: 105 mg/dL — AB (ref 70–99)
Potassium: 3.7 mEq/L (ref 3.5–5.1)
SODIUM: 139 meq/L (ref 135–145)
Total Bilirubin: 0.7 mg/dL (ref 0.2–1.2)
Total Protein: 8.3 g/dL (ref 6.0–8.3)

## 2017-10-25 LAB — URINALYSIS, ROUTINE W REFLEX MICROSCOPIC
Bilirubin Urine: NEGATIVE
Hgb urine dipstick: NEGATIVE
Ketones, ur: NEGATIVE
Nitrite: NEGATIVE
PH: 5.5 (ref 5.0–8.0)
RBC / HPF: NONE SEEN (ref 0–?)
SPECIFIC GRAVITY, URINE: 1.025 (ref 1.000–1.030)
TOTAL PROTEIN, URINE-UPE24: NEGATIVE
Urine Glucose: NEGATIVE
Urobilinogen, UA: 0.2 (ref 0.0–1.0)

## 2017-10-25 LAB — AMYLASE: AMYLASE: 19 U/L — AB (ref 27–131)

## 2017-10-25 LAB — LIPASE: LIPASE: 28 U/L (ref 11.0–59.0)

## 2017-10-25 NOTE — Telephone Encounter (Signed)
[name] phoned with high blood pressure readings this morning and over the last 2 weeks. She reported lightheaded feeling and a warm feeling in her head with the high readings. She has not been diagnosed with HTN but was given a prn prescription of Hydralazine for high readings.  She also mentioned that yesterday she voided more than usual and to the point her urine was very diluted. Reviewed some care advice and scheduled an appointment with L.Murray, as PCP had no availability.

## 2017-10-25 NOTE — Progress Notes (Signed)
ekg completed

## 2017-10-25 NOTE — Telephone Encounter (Signed)
  Reason for Disposition . Systolic BP  >= 960 OR Diastolic >= 454  Answer Assessment - Initial Assessment Questions 1. BLOOD PRESSURE: "What is the blood pressure?" "Did you take at least two measurements 5 minutes apart?"     166/78 HR 102, 167/89 HR 96 2. ONSET: "When did you take your blood pressure?"    Just now 3. HOW: "How did you obtain the blood pressure?" (e.g., visiting nurse, automatic home BP monitor)     Home monitor 4. HISTORY: "Do you have a history of high blood pressure?"     She did have high blood pressure in the hospital March 2018 5. MEDICATIONS: "Are you taking any medications for blood pressure?" "Have you missed any doses recently?"    no 6. OTHER SYMPTOMS: "Do you have any symptoms?" (e.g., headache, chest pain, blurred vision, difficulty breathing, weakness)    Warm feeling in the head, lightheaded. She then lays down and it goes away. 7. PREGNANCY: "Is there any chance you are pregnant?" "When was your last menstrual period?"   no  Protocols used: HIGH BLOOD PRESSURE-A-AH

## 2017-10-25 NOTE — Progress Notes (Signed)
[name] is a 72 y.o. female with the following history as recorded in [employer]:  Patient Active Problem List   Diagnosis Date Noted  . Cholecystitis 04/25/2017  . Abdominal pain, chronic, epigastric 04/11/2017  . RUQ abdominal pain 04/11/2017  . Anemia 12/13/2016  . Near syncope 11/16/2016  . Hypokalemia 05/18/2016  . Hematochezia 10/20/2015  . Genetic testing 05/04/2015  . History of colonic polyps 05/04/2015  . Family history of stomach cancer 05/04/2015  . Dense breast tissue 03/28/2015  . Flank pain 12/22/2014  . Well adult exam 11/12/2014  . IBS (irritable bowel syndrome) 07/09/2014  . LLQ abdominal pain 07/07/2014  . Obstipation 06/20/2014  . Dizziness, nonspecific 02/24/2014  . Fallopian tube cancer, carcinoma ([place]) 02/03/2014  . Diarrhea 01/20/2014  . Left groin pain 10/07/2013  . Piriformis syndrome of left side 09/23/2013  . Leg length discrepancy 09/23/2013  . Osteoporosis 02/05/2013  . Hyperglycemia 02/05/2013  . Dyslipidemia 02/05/2013  . Paresthesia of both legs 12/03/2010  . Paresthesia of hand 12/03/2010  . ACUTE BRONCHITIS 08/27/2009  . Neoplasm of digestive system 09/25/2008  . Abdominal pain 09/22/2008  . WARTS, VIRAL, UNSPECIFIED 11/12/2007  . Vitamin D deficiency 11/12/2007  . GERD 11/12/2007  . Essential hypertension 08/10/2007  . HIP PAIN 08/10/2007  . LOW BACK PAIN 08/10/2007  . B12 deficiency 05/31/2007    Current Outpatient Medications  Medication Sig Dispense Refill  . aspirin 81 MG EC tablet Take 81 mg by mouth daily.      . bimatoprost (LUMIGAN) 0.01 % SOLN Place 1 drop into both eyes at bedtime.    . Cholecalciferol (VITAMIN D3 SUPER STRENGTH) 2000 UNITS TABS Take 2 tablets (4,000 Units total) by mouth every morning. (Patient taking differently: Take 4,000 Units by mouth 3 (three) times a week. ) 100 each 3  . denosumab (PROLIA) 60 MG/ML SOLN injection Inject 60 mg into the skin every 6 (six) months. Administer in upper arm,  thigh, or abdomen    . diphenoxylate-atropine (LOMOTIL) 2.5-0.025 MG tablet Take 1 tablet by mouth 4 (four) times daily as needed for diarrhea or loose stools. 60 tablet 3  . hydrALAZINE (APRESOLINE) 25 MG tablet Take one tablet (25 mg) by mouth every 6 hours as needed for elevated blood pressure 30 tablet 3  . NASCOBAL 500 MCG/0.1ML SOLN USE ONE SPRAY NASALLY ONCE  A WEEK 3 Bottle 3  . potassium chloride SA (K-DUR,KLOR-CON) 20 MEQ tablet Take 1 tablet (20 mEq total) by mouth daily. 30 tablet 11  . RABEprazole (ACIPHEX) 20 MG tablet TAKE 1 TABLET BY MOUTH  DAILY 90 tablet 1  . timolol (TIMOPTIC-XR) 0.5 % ophthalmic gel-forming Place 1 drop into both eyes daily.     [name] triamcinolone cream (KENALOG) 0.1 % Apply 1 application topically 2 (two) times daily as needed (ear bacteria).      No current facility-administered medications for this visit.     Allergies: Actonel [risedronate sodium]; Boniva [ibandronate sodium]; Calcium channel blockers; Compazine; Lyrica [pregabalin]; and Tape  Past Medical History:  Diagnosis Date  . Adenomatous colon polyp 04/08/2011  . Anemia   . Anxiety   . Cataract   . GERD (gastroesophageal reflux disease)   . Glaucoma (increased eye pressure)   . Heart murmur   . HTN (hypertension)   . IBS (irritable bowel syndrome)   . LBP (low back pain)   . Menopause   . Osteoporosis   . Ovarian cancer ([place]) 09/2008   Dr Gwyneth Revels  . Pancreatic cyst   .  Vitamin B12 deficiency   . Vitamin D deficiency     Past Surgical History:  Procedure Laterality Date  . ABDOMINAL HYSTERECTOMY    . APPENDECTOMY    . CHOLECYSTECTOMY N/A 05/15/2017   Procedure: LAPAROSCOPIC CHOLECYSTECTOMY WITH INTRAOPERATIVE CHOLANGIOGRAM AND LYSIS OF ADHESIONS;  Surgeon: Fanny Skates, MD;  Location: [facility];  Service: General;  Laterality: N/A;  . ELBOW FRACTURE SURGERY     age 26- left elbow  . [place]  2001  . Ovarian Cancer [place]  09/2008    Family History  Problem Relation  Age of Onset  . Alzheimer's disease Mother 14  . Other Mother 41       TAH for excessive bleeding  . Lung cancer Father        smoker; metastasis to stomach and other areas  . Heart attack Maternal Uncle   . Other Paternal Aunt        stomach issues  . Heart Problems Paternal Uncle   . Other Maternal Grandmother        stomach issues; +hysterectomy  . Heart attack Maternal Grandfather   . Infertility Daughter   . Stomach cancer Cousin        dx. mid-60s  . Other Cousin        stomach issues  . Leukemia Cousin 18  . Stomach cancer Other   . Cancer Cousin        unknown type  . Other Cousin        stomach issues  . Heart Problems Maternal Uncle   . Diabetes Paternal Aunt   . Heart Problems Paternal Uncle   . Emphysema Paternal Uncle        work exposure  . Breast cancer Cousin        dx. late 60s-early 70s  . Colon cancer Neg Hx   . Rectal cancer Neg Hx   . Esophageal cancer Neg Hx     Social History   Tobacco Use  . Smoking status: Never Smoker  . Smokeless tobacco: Never Used  Substance Use Topics  . Alcohol use: No    Alcohol/week: 0.0 oz    Subjective:  Patient has history of elevated blood pressure but denies actually having hypertension- has taken Clonidine (for menopausal hot flashes) in the past but was stopped in the past year; was admitted to hospital in March 2018 with near syncopal episode/ extremely high blood pressure; had complete cardiac work-up at that time which was normal and was given Rx for Hydralazine to use as needed when her blood pressure goes up; was told that she did not need to continue seeing cardiology regularly; notes that she can feel a "hot sensation" come over her and when she checks her blood pressure, it will be elevated; was given #30 Hydralazine by her cardiologist last May to use as needed; has not needed at all in the past year but felt the "heat" sensation start in the past 48 hours and has begun checking her blood pressure at home;  it has been consistently elevated and she took her Hydralazine earlier today when blood pressure was at 151/105; Denies any chest pain, shortness of breath, blurred vision or headache. Is wondering if she could have underlying urinary tract infection- thought her urine "looked abnormal today." She also mentions occasional episodes of LUQ pain that have been present for the past few months but denies any unexplained changes in her bowel habits.    Objective:  Vitals:   10/25/17 1520  BP: (!) 152/92  Pulse: 94  Temp: 98 F (36.7 C)  TempSrc: Oral  SpO2: 98%  Weight: 185 lb 1.9 oz (84 kg)  Height: 5\' 4"  (1.626 m)    General: Well developed, well nourished, in no acute distress  Skin : Warm and dry.  Head: Normocephalic and atraumatic  Eyes: Sclera and conjunctiva clear; pupils round and reactive to light; extraocular movements intact  Ears: External normal; canals clear; tympanic membranes normal  Oropharynx: Pink, supple. No suspicious lesions  Neck: Supple without thyromegaly, adenopathy  Lungs: Respirations unlabored; clear to auscultation bilaterally without wheeze, rales, rhonchi  CVS exam: normal rate and regular rhythm.  Neurologic: Alert and oriented; speech intact; face symmetrical; moves all extremities well; CNII-XII intact without focal deficit  Assessment:  1. Elevated systolic blood pressure reading without diagnosis of hypertension   2. Abnormal urine finding   3. LUQ pain     Plan:  1. Check EKG in office- NSR; will check CBC, CMP today; will have patient continue her Hydralazine q 6- 8 hours as needed; discussed case with patient's PCP and he is in agreement that okay to refer to endocrine; she already has planned follow-up with her PCP for next week and is encouraged to keep that appointment. 2. Check U/A and urine culture; 3. Check amylase, lipase; follow-up to be determined.   No Follow-up on file.  Orders Placed This Encounter  Procedures  . Urine Culture     Standing Status:   Future    Number of Occurrences:   1    Standing Expiration Date:   10/25/2018  . CBC w/Diff    Standing Status:   Future    Number of Occurrences:   1    Standing Expiration Date:   10/25/2018  . Comprehensive metabolic panel    Standing Status:   Future    Number of Occurrences:   1    Standing Expiration Date:   04/24/2018  . Urinalysis    Standing Status:   Future    Number of Occurrences:   1    Standing Expiration Date:   10/25/2018  . Amylase    Standing Status:   Future    Number of Occurrences:   1    Standing Expiration Date:   10/25/2018  . Lipase    Standing Status:   Future    Number of Occurrences:   1    Standing Expiration Date:   10/25/2018  . EKG 12-Lead    Requested Prescriptions    No prescriptions requested or ordered in this encounter

## 2017-10-25 NOTE — Patient Instructions (Signed)
Please use your Hydralazine as instructed; will most likely have you follow-up with your endocrinologist to evaluate for an adrenal source of the symptoms.

## 2017-10-26 ENCOUNTER — Encounter: Payer: Self-pay | Admitting: Family

## 2017-10-26 LAB — URINE CULTURE
MICRO NUMBER: 90224562
SPECIMEN QUALITY: ADEQUATE

## 2017-10-27 ENCOUNTER — Other Ambulatory Visit: Payer: Self-pay | Admitting: Family

## 2017-10-27 DIAGNOSIS — E279 Disorder of adrenal gland, unspecified: Secondary | ICD-10-CM

## 2017-11-01 ENCOUNTER — Other Ambulatory Visit: Payer: Medicare Other

## 2017-11-01 ENCOUNTER — Encounter: Payer: Self-pay | Admitting: Internal Medicine

## 2017-11-01 ENCOUNTER — Ambulatory Visit (INDEPENDENT_AMBULATORY_CARE_PROVIDER_SITE_OTHER): Payer: Medicare Other | Admitting: Internal Medicine

## 2017-11-01 DIAGNOSIS — F410 Panic disorder [episodic paroxysmal anxiety] without agoraphobia: Secondary | ICD-10-CM | POA: Diagnosis not present

## 2017-11-01 DIAGNOSIS — E538 Deficiency of other specified B group vitamins: Secondary | ICD-10-CM | POA: Diagnosis not present

## 2017-11-01 DIAGNOSIS — I10 Essential (primary) hypertension: Secondary | ICD-10-CM

## 2017-11-01 IMAGING — CR DG CHEST 2V
2 series · 2 of 2 positions shown · non-contrast
Comparison: Chest radiograph performed 11/12/2014

CLINICAL DATA: Acute onset of high blood pressure. Initial
encounter.

EXAM:
CHEST  2 VIEW

[chest pa]
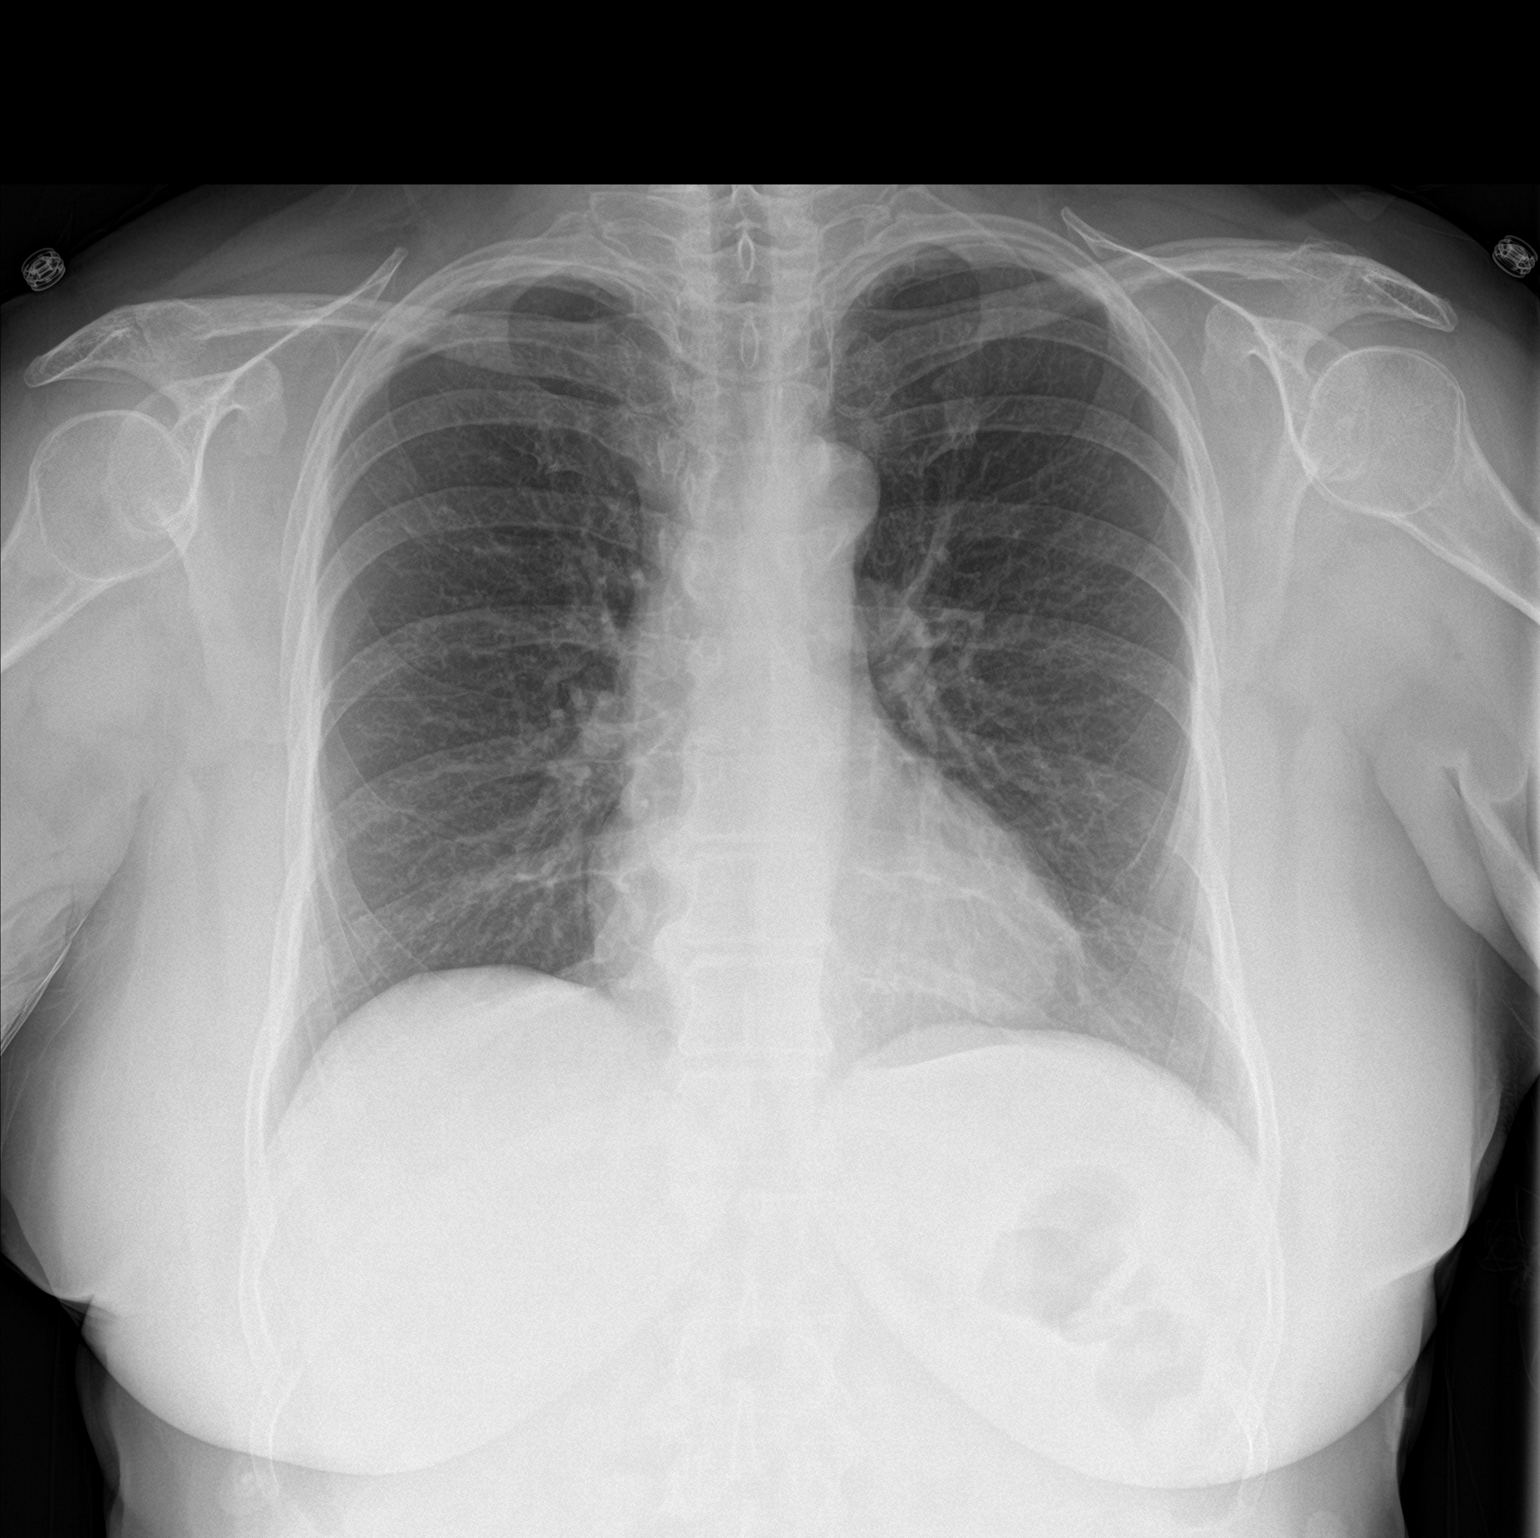

[chest lat]
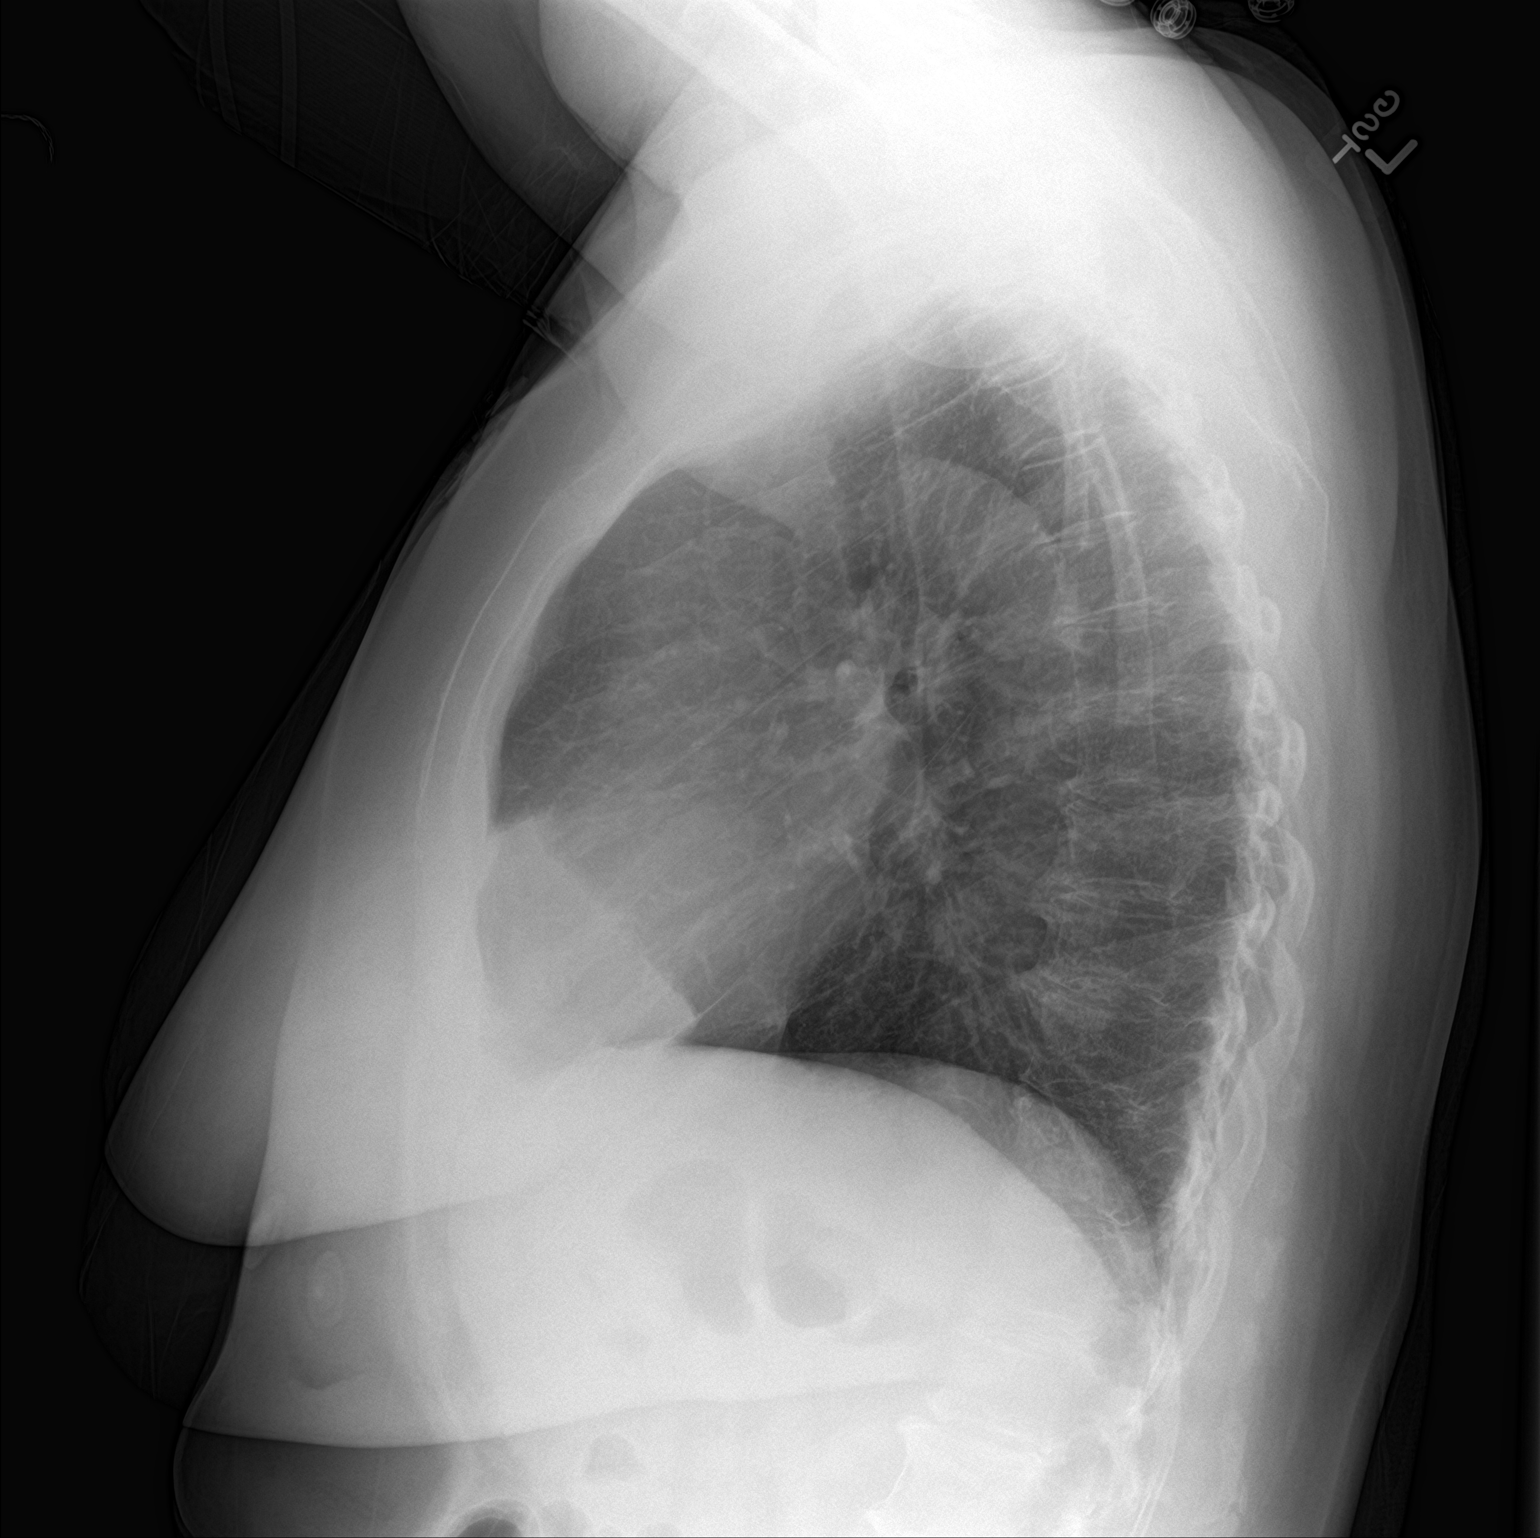

[2 of 2 positions shown; findings below may reference images not displayed]

FINDINGS: The lungs are well-aerated and clear. There is no evidence of focal
opacification, pleural effusion or pneumothorax.

The heart is normal in size; the mediastinal contour is within
normal limits. No acute osseous abnormalities are seen.
IMPRESSION: No acute cardiopulmonary process seen.

## 2017-11-01 MED ORDER — PROPRANOLOL HCL ER 60 MG PO CP24: 60.0000 mg | ORAL_CAPSULE | Freq: Every day | ORAL | 11 refills | Status: DC

## 2017-11-01 MED ORDER — LORAZEPAM 1 MG PO TABS: 1.0000 mg | ORAL_TABLET | Freq: Three times a day (TID) | ORAL | 1 refills | Status: DC | PRN

## 2017-11-01 NOTE — Progress Notes (Signed)
Subjective:  Patient ID: [name], female    DOB: [birth year]  Age: 72 y.o. MRN: [id number]  CC: No chief complaint on file.   HPI [name] presents for fluctuating BP and elevated BP C/ohead heaviness, flushing when BP is up. It is random. SBP at home is 119-190. C/o anxiety. Started Hydralazine 2 weeks ago  Outpatient Medications Prior to Visit  Medication Sig Dispense Refill  . aspirin 81 MG EC tablet Take 81 mg by mouth daily.      . bimatoprost (LUMIGAN) 0.01 % SOLN Place 1 drop into both eyes at bedtime.    . Cholecalciferol (VITAMIN D3 SUPER STRENGTH) 2000 UNITS TABS Take 2 tablets (4,000 Units total) by mouth every morning. (Patient taking differently: Take 4,000 Units by mouth 3 (three) times a week. ) 100 each 3  . denosumab (PROLIA) 60 MG/ML SOLN injection Inject 60 mg into the skin every 6 (six) months. Administer in upper arm, thigh, or abdomen    . diphenoxylate-atropine (LOMOTIL) 2.5-0.025 MG tablet Take 1 tablet by mouth 4 (four) times daily as needed for diarrhea or loose stools. 60 tablet 3  . hydrALAZINE (APRESOLINE) 25 MG tablet Take one tablet (25 mg) by mouth every 6 hours as needed for elevated blood pressure 30 tablet 3  . NASCOBAL 500 MCG/0.1ML SOLN USE ONE SPRAY NASALLY ONCE  A WEEK 3 Bottle 3  . potassium chloride SA (K-DUR,KLOR-CON) 20 MEQ tablet Take 1 tablet (20 mEq total) by mouth daily. 30 tablet 11  . RABEprazole (ACIPHEX) 20 MG tablet TAKE 1 TABLET BY MOUTH  DAILY 90 tablet 1  . timolol (TIMOPTIC-XR) 0.5 % ophthalmic gel-forming Place 1 drop into both eyes daily.     [name] triamcinolone cream (KENALOG) 0.1 % Apply 1 application topically 2 (two) times daily as needed (ear bacteria).      No facility-administered medications prior to visit.     ROS Review of Systems  Constitutional: Positive for fatigue. Negative for activity change, appetite change, chills and unexpected weight change.  HENT: Negative for congestion, mouth sores and sinus  pressure.   Eyes: Negative for visual disturbance.  Respiratory: Negative for cough and chest tightness.   Gastrointestinal: Negative for abdominal pain and nausea.  Genitourinary: Negative for difficulty urinating, frequency and vaginal pain.  Musculoskeletal: Negative for back pain and gait problem.  Skin: Negative for pallor and rash.  Neurological: Positive for headaches. Negative for dizziness, tremors, weakness and numbness.  Psychiatric/Behavioral: Negative for confusion and sleep disturbance. The patient is nervous/anxious.     Objective:  BP (!) 152/78 (BP Location: Left Arm, Patient Position: Sitting, Cuff Size: Large)   Pulse (!) 101   Temp 98 F (36.7 C) (Oral)   Ht 5\' 4"  (1.626 m)   Wt 185 lb (83.9 kg)   SpO2 99%   BMI 31.76 kg/m   BP Readings from Last 3 Encounters:  11/01/17 (!) 152/78  10/25/17 (!) 152/92  10/17/17 130/82    Wt Readings from Last 3 Encounters:  11/01/17 185 lb (83.9 kg)  10/25/17 185 lb 1.9 oz (84 kg)  10/17/17 189 lb 3.2 oz (85.8 kg)    Physical Exam  Constitutional: She appears well-developed. No distress.  HENT:  Head: Normocephalic.  Right Ear: External ear normal.  Left Ear: External ear normal.  Nose: Nose normal.  Mouth/Throat: Oropharynx is clear and moist.  Eyes: Conjunctivae are normal. Pupils are equal, round, and reactive to light. Right eye exhibits no discharge. Left eye  exhibits no discharge.  Neck: Normal range of motion. Neck supple. No JVD present. No tracheal deviation present. No thyromegaly present.  Cardiovascular: Normal rate, regular rhythm and normal heart sounds.  Pulmonary/Chest: No stridor. No respiratory distress. She has no wheezes.  Abdominal: Soft. Bowel sounds are normal. She exhibits no distension and no mass. There is no tenderness. There is no rebound and no guarding.  Musculoskeletal: She exhibits no edema or tenderness.  Lymphadenopathy:    She has no cervical adenopathy.  Neurological: She  displays normal reflexes. No cranial nerve deficit. She exhibits normal muscle tone. Coordination normal.  Skin: No rash noted. No erythema.  Psychiatric: She has a normal mood and affect. Her behavior is normal. Judgment and thought content normal.  anxious  Lab Results  Component Value Date   WBC 8.0 10/25/2017   HGB 13.1 10/25/2017   HCT 38.6 10/25/2017   PLT 261.0 10/25/2017   GLUCOSE 105 (H) 10/25/2017   CHOL 170 09/26/2016   TRIG 148.0 09/26/2016   HDL 40.10 09/26/2016   LDLCALC 101 (H) 09/26/2016   ALT 18 10/25/2017   AST 17 10/25/2017   NA 139 10/25/2017   K 3.7 10/25/2017   CL 105 10/25/2017   CREATININE 0.78 10/25/2017   BUN 15 10/25/2017   CO2 27 10/25/2017   TSH 1.530 11/28/2016   HGBA1C 6.0 08/17/2017    No results found.  Assessment & Plan:   There are no diagnoses linked to this encounter. I am having Brandi Bears L. Hollice Espy "KAY" maintain her aspirin, timolol, denosumab, Cholecalciferol, diphenoxylate-atropine, hydrALAZINE, triamcinolone cream, bimatoprost, NASCOBAL, RABEprazole, and potassium chloride SA.  No orders of the defined types were placed in this encounter.    Follow-up: No Follow-up on file.  Walker Kehr, MD

## 2017-11-01 NOTE — Assessment & Plan Note (Signed)
On B12 

## 2017-11-01 NOTE — Assessment & Plan Note (Signed)
Lorazepam prn  Potential benefits of a long term steroid  use as well as potential risks  and complications were explained to the patient and were aknowledged.

## 2017-11-01 NOTE — Assessment & Plan Note (Signed)
Start Propranolol 24 h urine for tests ordered

## 2017-11-03 ENCOUNTER — Other Ambulatory Visit: Payer: Medicare Other

## 2017-11-03 DIAGNOSIS — I10 Essential (primary) hypertension: Secondary | ICD-10-CM | POA: Diagnosis not present

## 2017-11-03 DIAGNOSIS — F410 Panic disorder [episodic paroxysmal anxiety] without agoraphobia: Secondary | ICD-10-CM

## 2017-11-03 LAB — TIQ-MISC: QUESTION:: 82384

## 2017-11-06 ENCOUNTER — Telehealth: Payer: Self-pay | Admitting: Internal Medicine

## 2017-11-06 NOTE — Telephone Encounter (Unsigned)
Copied from [place]. Topic: Quick Communication - See Telephone Encounter >> Nov 06, 2017  2:40 PM Neva Seat wrote: Sat. 3-2 -Urgent question / problem left on pt's MyChart is stating there are questions on lab orders. Please call pt back asap.

## 2017-11-07 NOTE — Telephone Encounter (Signed)
See lab results.  

## 2017-11-08 LAB — CATECHOLAMINES, FRACTIONATED, URINE, 24 HOUR
Calculated Total (E+NE): 53 mcg/24 h (ref 26–121)
Creatinine, Urine mg/day-CATEUR: 1.09 g/(24.h) (ref 0.50–2.15)
DOPAMINE, 24 HR URINE: 78 ug/(24.h) (ref 52–480)
NOREPINEPHRINE, 24 HR UR: 53 ug/(24.h) (ref 15–100)
Volume, Urine-VMAUR: 1900 mL

## 2017-11-08 LAB — METANEPHRINES, URINE, 24 HOUR
METANEPHRINES UR: 61 ug/(24.h) — AB (ref 90–315)
Metaneph Total, Ur: 458 mcg/24 h (ref 224–832)
NORMETANEPHRINE 24H UR: 397 ug/(24.h) (ref 122–676)
Volume, Urine-VMAUR: 1900 mL

## 2017-11-20 ENCOUNTER — Other Ambulatory Visit (INDEPENDENT_AMBULATORY_CARE_PROVIDER_SITE_OTHER): Payer: Medicare Other

## 2017-11-20 ENCOUNTER — Ambulatory Visit (INDEPENDENT_AMBULATORY_CARE_PROVIDER_SITE_OTHER): Payer: Medicare Other | Admitting: Internal Medicine

## 2017-11-20 ENCOUNTER — Encounter: Payer: Self-pay | Admitting: Internal Medicine

## 2017-11-20 VITALS — BP 136/72 | HR 61 | Temp 98.2°F | Ht 64.0 in | Wt 188.0 lb

## 2017-11-20 DIAGNOSIS — I10 Essential (primary) hypertension: Secondary | ICD-10-CM | POA: Diagnosis not present

## 2017-11-20 DIAGNOSIS — K580 Irritable bowel syndrome with diarrhea: Secondary | ICD-10-CM | POA: Diagnosis not present

## 2017-11-20 DIAGNOSIS — C5700 Malignant neoplasm of unspecified fallopian tube: Secondary | ICD-10-CM

## 2017-11-20 DIAGNOSIS — R55 Syncope and collapse: Secondary | ICD-10-CM

## 2017-11-20 DIAGNOSIS — E538 Deficiency of other specified B group vitamins: Secondary | ICD-10-CM | POA: Diagnosis not present

## 2017-11-20 DIAGNOSIS — E559 Vitamin D deficiency, unspecified: Secondary | ICD-10-CM

## 2017-11-20 DIAGNOSIS — D649 Anemia, unspecified: Secondary | ICD-10-CM | POA: Diagnosis not present

## 2017-11-20 DIAGNOSIS — R232 Flushing: Secondary | ICD-10-CM | POA: Diagnosis not present

## 2017-11-20 LAB — CBC WITH DIFFERENTIAL/PLATELET
Basophils Absolute: 0 10*3/uL (ref 0.0–0.1)
Basophils Relative: 0.6 % (ref 0.0–3.0)
EOS ABS: 0.1 10*3/uL (ref 0.0–0.7)
EOS PCT: 1.6 % (ref 0.0–5.0)
HCT: 34.8 % — ABNORMAL LOW (ref 36.0–46.0)
HEMOGLOBIN: 11.9 g/dL — AB (ref 12.0–15.0)
LYMPHS ABS: 1.5 10*3/uL (ref 0.7–4.0)
Lymphocytes Relative: 20.8 % (ref 12.0–46.0)
MCHC: 34.2 g/dL (ref 30.0–36.0)
MCV: 84.7 fl (ref 78.0–100.0)
MONO ABS: 0.6 10*3/uL (ref 0.1–1.0)
Monocytes Relative: 7.8 % (ref 3.0–12.0)
NEUTROS PCT: 69.2 % (ref 43.0–77.0)
Neutro Abs: 5.1 10*3/uL (ref 1.4–7.7)
Platelets: 213 10*3/uL (ref 150.0–400.0)
RBC: 4.1 Mil/uL (ref 3.87–5.11)
RDW: 14.8 % (ref 11.5–15.5)
WBC: 7.4 10*3/uL (ref 4.0–10.5)

## 2017-11-20 LAB — BASIC METABOLIC PANEL
BUN: 14 mg/dL (ref 6–23)
CALCIUM: 9.4 mg/dL (ref 8.4–10.5)
CO2: 28 meq/L (ref 19–32)
Chloride: 105 mEq/L (ref 96–112)
Creatinine, Ser: 0.77 mg/dL (ref 0.40–1.20)
GFR: 78.33 mL/min (ref >60.00)
Glucose, Bld: 72 mg/dL (ref 70–99)
POTASSIUM: 3.8 meq/L (ref 3.5–5.1)
SODIUM: 142 meq/L (ref 135–145)

## 2017-11-20 MED ORDER — CILIDINIUM-CHLORDIAZEPOXIDE 2.5-5 MG PO CAPS: 1.0000 | ORAL_CAPSULE | Freq: Three times a day (TID) | ORAL | 3 refills | Status: DC | PRN

## 2017-11-20 MED ORDER — PROPRANOLOL HCL ER 60 MG PO CP24: 60.0000 mg | ORAL_CAPSULE | Freq: Two times a day (BID) | ORAL | 11 refills | Status: DC

## 2017-11-20 MED ORDER — VITAMIN D3 1.25 MG (50000 UT) PO CAPS: 1.0000 | ORAL_CAPSULE | ORAL | 3 refills | Status: DC

## 2017-11-20 NOTE — Assessment & Plan Note (Addendum)
Increase Inderal LA to bid

## 2017-11-20 NOTE — Assessment & Plan Note (Signed)
Gluten free diet 

## 2017-11-20 NOTE — Assessment & Plan Note (Signed)
Vit D monthly 

## 2017-11-20 NOTE — Progress Notes (Signed)
Subjective:  Patient ID: [name], female    DOB: [birth year]  Age: 72 y.o. MRN: [id number]  CC: No chief complaint on file.   HPI MIKAILAH MOREL presents for HTN, abdominal cramps, "rushing feeling" after some meals  Outpatient Medications Prior to Visit  Medication Sig Dispense Refill  . aspirin 81 MG EC tablet Take 81 mg by mouth daily.      . bimatoprost (LUMIGAN) 0.01 % SOLN Place 1 drop into both eyes at bedtime.    . Cholecalciferol (VITAMIN D3 SUPER STRENGTH) 2000 UNITS TABS Take 2 tablets (4,000 Units total) by mouth every morning. (Patient taking differently: Take 4,000 Units by mouth 3 (three) times a week. ) 100 each 3  . denosumab (PROLIA) 60 MG/ML SOLN injection Inject 60 mg into the skin every 6 (six) months. Administer in upper arm, thigh, or abdomen    . diphenoxylate-atropine (LOMOTIL) 2.5-0.025 MG tablet Take 1 tablet by mouth 4 (four) times daily as needed for diarrhea or loose stools. 60 tablet 3  . LORazepam (ATIVAN) 1 MG tablet Take 1 tablet (1 mg total) by mouth every 8 (eight) hours as needed for anxiety. 60 tablet 1  . NASCOBAL 500 MCG/0.1ML SOLN USE ONE SPRAY NASALLY ONCE  A WEEK 3 Bottle 3  . potassium chloride SA (K-DUR,KLOR-CON) 20 MEQ tablet Take 1 tablet (20 mEq total) by mouth daily. 30 tablet 11  . propranolol ER (INDERAL LA) 60 MG 24 hr capsule Take 1 capsule (60 mg total) by mouth daily. 30 capsule 11  . RABEprazole (ACIPHEX) 20 MG tablet TAKE 1 TABLET BY MOUTH  DAILY 90 tablet 1  . timolol (TIMOPTIC-XR) 0.5 % ophthalmic gel-forming Place 1 drop into both eyes daily.     [name] triamcinolone cream (KENALOG) 0.1 % Apply 1 application topically 2 (two) times daily as needed (ear bacteria).     . hydrALAZINE (APRESOLINE) 25 MG tablet Take one tablet (25 mg) by mouth every 6 hours as needed for elevated blood pressure 30 tablet 3   No facility-administered medications prior to visit.     ROS Review of Systems  Constitutional: Positive for  diaphoresis and fatigue. Negative for activity change, appetite change, chills and unexpected weight change.  HENT: Negative for congestion, mouth sores and sinus pressure.   Eyes: Negative for visual disturbance.  Respiratory: Negative for cough and chest tightness.   Gastrointestinal: Positive for diarrhea. Negative for abdominal pain and nausea.  Genitourinary: Negative for difficulty urinating, frequency and vaginal pain.  Musculoskeletal: Negative for back pain and gait problem.  Skin: Negative for pallor and rash.  Neurological: Negative for dizziness, tremors, weakness, numbness and headaches.  Psychiatric/Behavioral: Negative for confusion, sleep disturbance and suicidal ideas. The patient is nervous/anxious.     Objective:  BP 136/72 (BP Location: Left Arm, Patient Position: Sitting, Cuff Size: Large)   Pulse 61   Temp 98.2 F (36.8 C) (Oral)   Ht 5\' 4"  (1.626 m)   Wt 188 lb (85.3 kg)   SpO2 98%   BMI 32.27 kg/m   BP Readings from Last 3 Encounters:  11/20/17 136/72  11/01/17 (!) 152/78  10/25/17 (!) 152/92    Wt Readings from Last 3 Encounters:  11/20/17 188 lb (85.3 kg)  11/01/17 185 lb (83.9 kg)  10/25/17 185 lb 1.9 oz (84 kg)    Physical Exam  Constitutional: She appears well-developed. No distress.  HENT:  Head: Normocephalic.  Right Ear: External ear normal.  Left Ear: External ear normal.  Nose: Nose normal.  Mouth/Throat: Oropharynx is clear and moist.  Eyes: Conjunctivae are normal. Pupils are equal, round, and reactive to light. Right eye exhibits no discharge. Left eye exhibits no discharge.  Neck: Normal range of motion. Neck supple. No JVD present. No tracheal deviation present. No thyromegaly present.  Cardiovascular: Normal rate, regular rhythm and normal heart sounds.  Pulmonary/Chest: No stridor. No respiratory distress. She has no wheezes.  Abdominal: Soft. Bowel sounds are normal. She exhibits no distension and no mass. There is no  tenderness. There is no rebound and no guarding.  Musculoskeletal: She exhibits no edema or tenderness.  Lymphadenopathy:    She has no cervical adenopathy.  Neurological: She displays normal reflexes. No cranial nerve deficit. She exhibits normal muscle tone. Coordination normal.  Skin: No rash noted. No erythema.  Psychiatric: Her behavior is normal. Judgment and thought content normal.  anxious Obese  Lab Results  Component Value Date   WBC 8.0 10/25/2017   HGB 13.1 10/25/2017   HCT 38.6 10/25/2017   PLT 261.0 10/25/2017   GLUCOSE 105 (H) 10/25/2017   CHOL 170 09/26/2016   TRIG 148.0 09/26/2016   HDL 40.10 09/26/2016   LDLCALC 101 (H) 09/26/2016   ALT 18 10/25/2017   AST 17 10/25/2017   NA 139 10/25/2017   K 3.7 10/25/2017   CL 105 10/25/2017   CREATININE 0.78 10/25/2017   BUN 15 10/25/2017   CO2 27 10/25/2017   TSH 1.530 11/28/2016   HGBA1C 6.0 08/17/2017    No results found.  Assessment & Plan:   There are no diagnoses linked to this encounter. I have discontinued Curt Bears L. Borras "KAY"'s hydrALAZINE. I am also having her maintain her aspirin, timolol, denosumab, Cholecalciferol, diphenoxylate-atropine, triamcinolone cream, bimatoprost, NASCOBAL, RABEprazole, potassium chloride SA, propranolol ER, and LORazepam.  No orders of the defined types were placed in this encounter.    Follow-up: No Follow-up on file.  Walker Kehr, MD

## 2017-11-20 NOTE — Assessment & Plan Note (Signed)
CBC

## 2017-11-20 NOTE — Assessment & Plan Note (Signed)
Nasal B12

## 2017-11-20 NOTE — Patient Instructions (Signed)
  Gluten free trial for 4-6 weeks. OK to use gluten-free bread and gluten-free pasta.    Gluten-Free Diet for Celiac Disease, Adult The gluten-free diet includes all foods that do not contain gluten. Gluten is a protein that is found in wheat, rye, barley, and some other grains. Following the gluten-free diet is the only treatment for people with celiac disease. It helps to prevent damage to the intestines and improves or eliminates the symptoms of celiac disease. Following the gluten-free diet requires some planning. It can be challenging at first, but it gets easier with time and practice. There are more gluten-free options available today than ever before. If you need help finding gluten-free foods or if you have questions, talk with your diet and nutrition specialist (registered dietitian) or your health care provider. What do I need to know about a gluten-free diet?  All fruits, vegetables, and meats are safe to eat and do not contain gluten.  When grocery shopping, start by shopping in the produce, meat, and dairy sections. These sections are more likely to contain gluten-free foods. Then move to the aisles that contain packaged foods if you need to.  Read all food labels. Gluten is often added to foods. Always check the ingredient list and look for warnings, such as "may contain gluten."  Talk with your dietitian or health care provider before taking a gluten-free multivitamin or mineral supplement.  Be aware of gluten-free foods having contact with foods that contain gluten (cross-contamination). This can happen at home and with any processed foods. ? Talk with your health care provider or dietitian about how to reduce the risk of cross-contamination in your home. ? If you have questions about how a food is processed, ask the manufacturer. What key words help to identify gluten? Foods that list any of these key words on the label usually contain gluten:  Wheat, flour, enriched  flour, bromated flour, white flour, durum flour, graham flour, phosphated flour, self-rising flour, semolina, farina, barley (malt), rye, and oats.  Starch, dextrin, modified food starch, or cereal.  Thickening, fillers, or emulsifiers.  Malt flavoring, malt extract, or malt syrup.  Hydrolyzed vegetable protein.  In the [place] packaged foods that are gluten-free are required to be labeled "GF." These foods should be easy to identify and are safe to eat. In the [place] food companies are also required to list common food allergens, including wheat, on their labels. Recommended foods Grains  Amaranth, bean flours, 100% buckwheat flour, corn, millet, nut flours or nut meals, GF oats, quinoa, rice, sorghum, teff, rice wafers, pure cornmeal tortillas, popcorn, and hot cereals made from cornmeal. Hominy, rice, wild rice. Some Asian rice noodles or bean noodles. Arrowroot starch, corn bran, corn flour, corn germ, cornmeal, corn starch, potato flour, potato starch flour, and rice bran. Plain, brown, and sweet rice flours. Rice polish, soy flour, and tapioca starch. Vegetables  All plain fresh, frozen, and canned vegetables. Fruits  All plain fresh, frozen, canned, and dried fruits, and 100% fruit juices. Meats and other protein foods  All fresh beef, pork, poultry, fish, seafood, and eggs. Fish canned in water, oil, brine, or vegetable broth. Plain nuts and seeds, peanut butter. Some lunch meat and some frankfurters. Dried beans, dried peas, and lentils. Dairy  Fresh plain, dry, evaporated, or condensed milk. Cream, butter, sour cream, whipping cream, and most yogurts. Unprocessed cheese, most processed cheeses, some cottage cheese, some cream cheeses. Beverages  Coffee, tea, most herbal teas. Carbonated beverages and some root beers.   Wine, sake, and distilled spirits, such as gin, vodka, and whiskey. Most hard ciders. Fats and oils  Butter, margarine, vegetable oil, hydrogenated butter, olive  oil, shortening, lard, cream, and some mayonnaise. Some commercial salad dressings. Olives. Sweets and desserts  Sugar, honey, some syrups, molasses, jelly, and jam. Plain hard candy, marshmallows, and gumdrops. Pure cocoa powder. Plain chocolate. Custard and some pudding mixes. Gelatin desserts, sorbets, frozen ice pops, and sherbet. Cake, cookies, and other desserts prepared with allowed flours. Some commercial ice creams. Cornstarch, tapioca, and rice puddings. Seasoning and other foods  Some canned or frozen soups. Monosodium glutamate (MSG). Cider, rice, and wine vinegar. Baking soda and baking powder. Cream of tartar. Baking and nutritional yeast. Certain soy sauces made without wheat (ask your dietitian about specific brands that are allowed). Nuts, coconut, and chocolate. Salt, pepper, herbs, spices, flavoring extracts, imitation or artificial flavorings, natural flavorings, and food colorings. Some medicines and supplements. Some lip glosses and other cosmetics. Rice syrups. The items listed may not be a complete list. Talk with your dietitian about what dietary choices are best for you. Foods to avoid Grains  Barley, bran, bulgur, couscous, cracked wheat, [place], farro, graham, malt, matzo, semolina, wheat germ, and all wheat and rye cereals including spelt and kamut. Cereals containing malt as a flavoring, such as rice cereal. Noodles, spaghetti, macaroni, most packaged rice mixes, and all mixes containing wheat, rye, barley, or triticale. Vegetables  Most creamed vegetables and most vegetables canned in sauces. Some commercially prepared vegetables and salads. Fruits  Thickened or prepared fruits and some pie fillings. Some fruit snacks and fruit roll-ups. Meats and other protein foods  Any meat or meat alternative containing wheat, rye, barley, or gluten stabilizers. These are often marinated or packaged meats and lunch meats. Bread-containing products, such as Swiss steak,  croquettes, meatballs, and meatloaf. Most tuna canned in vegetable broth and turkey with hydrolyzed vegetable protein (HVP) injected as part of the basting. Seitan. Imitation fish. Eggs in sauces made from ingredients to avoid. Dairy  Commercial chocolate milk drinks and malted milk. Some non-dairy creamers. Any cheese product containing ingredients to avoid. Beverages  Certain cereal beverages. Beer, ale, malted milk, and some root beers. Some hard ciders. Some instant flavored coffees. Some herbal teas made with barley or with barley malt added. Fats and oils  Some commercial salad dressings. Sour cream containing modified food starch. Sweets and desserts  Some toffees. Chocolate-coated nuts (may be rolled in wheat flour) and some commercial candies and candy bars. Most cakes, cookies, donuts, pastries, and other baked goods. Some commercial ice cream. Ice cream cones. Commercially prepared mixes for cakes, cookies, and other desserts. Bread pudding and other puddings thickened with flour. Products containing brown rice syrup made with barley malt enzyme. Desserts and sweets made with malt flavoring. Seasoning and other foods  Some curry powders, some dry seasoning mixes, some gravy extracts, some meat sauces, some ketchups, some prepared mustards, and horseradish. Certain soy sauces. Malt vinegar. Bouillon and bouillon cubes that contain HVP. Some chip dips, and some chewing gum. Yeast extract. Brewer's yeast. Caramel color. Some medicines and supplements. Some lip glosses and other cosmetics. The items listed may not be a complete list. Talk with your dietitian about what dietary choices are best for you. Summary  Gluten is a protein that is found in wheat, rye, barley, and some other grains. The gluten-free diet includes all foods that do not contain gluten.  If you need help finding gluten-free foods or if   you have questions, talk with your diet and nutrition specialist (registered  dietitian) or your health care provider.  Read all food labels. Gluten is often added to foods. Always check the ingredient list and look for warnings, such as "may contain gluten." This information is not intended to replace advice given to you by your health care provider. Make sure you discuss any questions you have with your health care provider. Document Released: 08/22/2005 Document Revised: 06/06/2016 Document Reviewed: 06/06/2016 Elsevier Interactive Patient Education  2018 Elsevier Inc[employer]   

## 2017-11-20 NOTE — Assessment & Plan Note (Addendum)
Autonomic dysfunction/hormonal 2019 Librax prn

## 2017-11-21 DIAGNOSIS — R232 Flushing: Secondary | ICD-10-CM | POA: Insufficient documentation

## 2017-11-21 LAB — CA 125: CA 125: 9 U/mL (ref <35)

## 2017-11-21 NOTE — Assessment & Plan Note (Signed)
Autonomic dysfunction/hormonal 2019 Librax prn

## 2017-12-06 ENCOUNTER — Other Ambulatory Visit: Payer: Self-pay | Admitting: Internal Medicine

## 2017-12-06 DIAGNOSIS — Z139 Encounter for screening, unspecified: Secondary | ICD-10-CM

## 2018-01-10 ENCOUNTER — Ambulatory Visit
Admission: RE | Admit: 2018-01-10 | Discharge: 2018-01-10 | Disposition: A | Discharge status: Home / Self Care | Payer: Medicare Other | Source: Ambulatory Visit | Attending: Internal Medicine | Admitting: Internal Medicine

## 2018-01-10 DIAGNOSIS — Z139 Encounter for screening, unspecified: Secondary | ICD-10-CM

## 2018-01-10 DIAGNOSIS — Z1231 Encounter for screening mammogram for malignant neoplasm of breast: Secondary | ICD-10-CM | POA: Diagnosis not present

## 2018-01-17 ENCOUNTER — Encounter: Payer: Self-pay | Admitting: Internal Medicine

## 2018-01-17 ENCOUNTER — Ambulatory Visit (INDEPENDENT_AMBULATORY_CARE_PROVIDER_SITE_OTHER): Payer: Medicare Other | Admitting: Internal Medicine

## 2018-01-17 VITALS — BP 142/80 | HR 48 | Temp 97.7°F | Ht 64.0 in | Wt 193.0 lb

## 2018-01-17 DIAGNOSIS — I10 Essential (primary) hypertension: Secondary | ICD-10-CM

## 2018-01-17 DIAGNOSIS — R55 Syncope and collapse: Secondary | ICD-10-CM | POA: Diagnosis not present

## 2018-01-17 DIAGNOSIS — E559 Vitamin D deficiency, unspecified: Secondary | ICD-10-CM | POA: Diagnosis not present

## 2018-01-17 DIAGNOSIS — Z23 Encounter for immunization: Secondary | ICD-10-CM

## 2018-01-17 DIAGNOSIS — J300 Vasomotor rhinitis: Secondary | ICD-10-CM

## 2018-01-17 DIAGNOSIS — R42 Dizziness and giddiness: Secondary | ICD-10-CM

## 2018-01-17 DIAGNOSIS — J310 Chronic rhinitis: Secondary | ICD-10-CM | POA: Insufficient documentation

## 2018-01-17 MED ORDER — IPRATROPIUM BROMIDE 0.06 % NA SOLN: 2.0000 | Freq: Three times a day (TID) | NASAL | 2 refills | Status: DC

## 2018-01-17 NOTE — Progress Notes (Signed)
Subjective:  Patient ID: [name], female    DOB: 1946/03/23  Age: 72 y.o. MRN: [id number]  CC: No chief complaint on file.   HPI [name] presents for dizziness, fluctuating BP off and on. SBP 130160, once SBP 183. C/o runny nose  Outpatient Medications Prior to Visit  Medication Sig Dispense Refill  . aspirin 81 MG EC tablet Take 81 mg by mouth daily.      . bimatoprost (LUMIGAN) 0.01 % SOLN Place 1 drop into both eyes at bedtime.    . Cholecalciferol (VITAMIN D3) 50000 units CAPS Take 1 capsule by mouth every 30 (thirty) days. 3 capsule 3  . clidinium-chlordiazePOXIDE (LIBRAX) 5-2.5 MG capsule Take 1 capsule by mouth 3 (three) times daily as needed. 60 capsule 3  . denosumab (PROLIA) 60 MG/ML SOLN injection Inject 60 mg into the skin every 6 (six) months. Administer in upper arm, thigh, or abdomen    . diphenoxylate-atropine (LOMOTIL) 2.5-0.025 MG tablet Take 1 tablet by mouth 4 (four) times daily as needed for diarrhea or loose stools. 60 tablet 3  . NASCOBAL 500 MCG/0.1ML SOLN USE ONE SPRAY NASALLY ONCE  A WEEK 3 Bottle 3  . potassium chloride SA (K-DUR,KLOR-CON) 20 MEQ tablet Take 1 tablet (20 mEq total) by mouth daily. 30 tablet 11  . propranolol ER (INDERAL LA) 60 MG 24 hr capsule Take 1 capsule (60 mg total) by mouth 2 (two) times daily. 60 capsule 11  . RABEprazole (ACIPHEX) 20 MG tablet TAKE 1 TABLET BY MOUTH  DAILY 90 tablet 1  . timolol (TIMOPTIC-XR) 0.5 % ophthalmic gel-forming Place 1 drop into both eyes daily.     [name] triamcinolone cream (KENALOG) 0.1 % Apply 1 application topically 2 (two) times daily as needed (ear bacteria).      No facility-administered medications prior to visit.     ROS Review of Systems  Constitutional: Positive for fatigue. Negative for activity change, appetite change, chills and unexpected weight change.  HENT: Negative for congestion, mouth sores and sinus pressure.   Eyes: Negative for visual disturbance.  Respiratory:  Negative for cough and chest tightness.   Gastrointestinal: Negative for abdominal pain and nausea.  Genitourinary: Negative for difficulty urinating, frequency and vaginal pain.  Musculoskeletal: Negative for back pain and gait problem.  Skin: Negative for pallor and rash.  Neurological: Positive for dizziness and light-headedness. Negative for tremors, weakness, numbness and headaches.  Psychiatric/Behavioral: Negative for confusion, sleep disturbance and suicidal ideas. The patient is not nervous/anxious.     Objective:  BP (!) 142/80 (BP Location: Left Arm, Patient Position: Sitting, Cuff Size: Large)   Pulse (!) 48   Temp 97.7 F (36.5 C) (Oral)   Ht 5\' 4"  (1.626 m)   Wt 193 lb (87.5 kg)   SpO2 99%   BMI 33.13 kg/m   BP Readings from Last 3 Encounters:  01/17/18 (!) 142/80  11/20/17 136/72  11/01/17 (!) 152/78    Wt Readings from Last 3 Encounters:  01/17/18 193 lb (87.5 kg)  11/20/17 188 lb (85.3 kg)  11/01/17 185 lb (83.9 kg)    Physical Exam  Constitutional: She appears well-developed. No distress.  HENT:  Head: Normocephalic.  Right Ear: External ear normal.  Left Ear: External ear normal.  Nose: Nose normal.  Mouth/Throat: Oropharynx is clear and moist.  Eyes: Pupils are equal, round, and reactive to light. Conjunctivae are normal. Right eye exhibits no discharge. Left eye exhibits no discharge.  Neck: Normal range of  motion. Neck supple. No JVD present. No tracheal deviation present. No thyromegaly present.  Cardiovascular: Normal rate, regular rhythm and normal heart sounds.  Pulmonary/Chest: No stridor. No respiratory distress. She has no wheezes.  Abdominal: Soft. Bowel sounds are normal. She exhibits no distension and no mass. There is no tenderness. There is no rebound and no guarding.  Musculoskeletal: She exhibits no edema or tenderness.  Lymphadenopathy:    She has no cervical adenopathy.  Neurological: She displays normal reflexes. No cranial nerve  deficit. She exhibits normal muscle tone. Coordination normal.  Skin: No rash noted. No erythema.  Psychiatric: She has a normal mood and affect. Her behavior is normal. Judgment and thought content normal.   Obese  Lab Results  Component Value Date   WBC 7.4 11/20/2017   HGB 11.9 (L) 11/20/2017   HCT 34.8 (L) 11/20/2017   PLT 213.0 11/20/2017   GLUCOSE 72 11/20/2017   CHOL 170 09/26/2016   TRIG 148.0 09/26/2016   HDL 40.10 09/26/2016   LDLCALC 101 (H) 09/26/2016   ALT 18 10/25/2017   AST 17 10/25/2017   NA 142 11/20/2017   K 3.8 11/20/2017   CL 105 11/20/2017   CREATININE 0.77 11/20/2017   BUN 14 11/20/2017   CO2 28 11/20/2017   TSH 1.530 11/28/2016   HGBA1C 6.0 08/17/2017    Mm Screening Breast Tomo Bilateral  Result Date: 01/10/2018 CLINICAL DATA:  Screening. EXAM: DIGITAL SCREENING BILATERAL MAMMOGRAM WITH TOMO AND CAD COMPARISON:  Previous exam(s). ACR Breast Density Category b: There are scattered areas of fibroglandular density. FINDINGS: There are no findings suspicious for malignancy. Images were processed with CAD. IMPRESSION: No mammographic evidence of malignancy. A result letter of this screening mammogram will be mailed directly to the patient. RECOMMENDATION: Screening mammogram in one year. (Code:SM-B-01Y) BI-RADS CATEGORY  1: Negative. Electronically Signed   By: Fidela Salisbury M.D.   On: 01/10/2018 14:59    Assessment & Plan:   There are no diagnoses linked to this encounter. I am having Curt Bears L. Hollice Espy "KAY" maintain her aspirin, timolol, denosumab, diphenoxylate-atropine, triamcinolone cream, bimatoprost, NASCOBAL, RABEprazole, potassium chloride SA, propranolol ER, Vitamin D3, and clidinium-chlordiazePOXIDE.  No orders of the defined types were placed in this encounter.    Follow-up: No follow-ups on file.  Walker Kehr, MD

## 2018-01-17 NOTE — Assessment & Plan Note (Addendum)
ENT ref Neurol ref 

## 2018-01-17 NOTE — Assessment & Plan Note (Signed)
Vit D 

## 2018-01-17 NOTE — Addendum Note (Signed)
Addended by: Karren Cobble on: 01/17/2018 03:13 PM   Modules accepted: Orders

## 2018-01-17 NOTE — Assessment & Plan Note (Addendum)
No relapse ENT ref Neurol ref

## 2018-01-17 NOTE — Assessment & Plan Note (Signed)
Atrovent nasal qid prn

## 2018-01-17 NOTE — Assessment & Plan Note (Signed)
Inderal LA  bid

## 2018-01-17 NOTE — Patient Instructions (Signed)
Try caffeine tablets for energy with caution Start exercising on your stationary bike

## 2018-01-22 ENCOUNTER — Other Ambulatory Visit: Payer: Self-pay | Admitting: Internal Medicine

## 2018-01-25 ENCOUNTER — Other Ambulatory Visit: Payer: Self-pay | Admitting: Internal Medicine

## 2018-02-17 ENCOUNTER — Other Ambulatory Visit: Payer: Self-pay | Admitting: Internal Medicine

## 2018-02-19 ENCOUNTER — Other Ambulatory Visit: Payer: Medicare Other

## 2018-02-20 DIAGNOSIS — H9312 Tinnitus, left ear: Secondary | ICD-10-CM | POA: Diagnosis not present

## 2018-02-20 DIAGNOSIS — R42 Dizziness and giddiness: Secondary | ICD-10-CM | POA: Diagnosis not present

## 2018-02-20 DIAGNOSIS — H903 Sensorineural hearing loss, bilateral: Secondary | ICD-10-CM | POA: Diagnosis not present

## 2018-02-21 ENCOUNTER — Ambulatory Visit: Payer: Medicare Other | Admitting: Endocrinology

## 2018-03-05 ENCOUNTER — Ambulatory Visit (INDEPENDENT_AMBULATORY_CARE_PROVIDER_SITE_OTHER): Payer: Medicare Other

## 2018-03-05 DIAGNOSIS — M810 Age-related osteoporosis without current pathological fracture: Secondary | ICD-10-CM | POA: Diagnosis not present

## 2018-03-05 MED ORDER — DENOSUMAB 60 MG/ML ~~LOC~~ SOSY
60.0000 mg | PREFILLED_SYRINGE | Freq: Once | SUBCUTANEOUS | Status: AC
  Administered 2018-03-05: 60 mg via SUBCUTANEOUS

## 2018-03-14 ENCOUNTER — Other Ambulatory Visit (INDEPENDENT_AMBULATORY_CARE_PROVIDER_SITE_OTHER): Payer: Medicare Other

## 2018-03-14 DIAGNOSIS — R7301 Impaired fasting glucose: Secondary | ICD-10-CM

## 2018-03-14 DIAGNOSIS — E785 Hyperlipidemia, unspecified: Secondary | ICD-10-CM

## 2018-03-14 LAB — LIPID PANEL
CHOLESTEROL: 168 mg/dL (ref 0–200)
HDL: 46.7 mg/dL (ref >39.00)
LDL CALC: 93 mg/dL (ref 0–99)
NONHDL: 121.57
Total CHOL/HDL Ratio: 4
Triglycerides: 145 mg/dL (ref 0.0–149.0)
VLDL: 29 mg/dL (ref 0.0–40.0)

## 2018-03-14 LAB — HEMOGLOBIN A1C: HEMOGLOBIN A1C: 6.4 % (ref 4.6–6.5)

## 2018-03-14 LAB — GLUCOSE, RANDOM: Glucose, Bld: 126 mg/dL — ABNORMAL HIGH (ref 70–99)

## 2018-03-21 ENCOUNTER — Encounter: Payer: Self-pay | Admitting: Endocrinology

## 2018-03-21 ENCOUNTER — Ambulatory Visit (INDEPENDENT_AMBULATORY_CARE_PROVIDER_SITE_OTHER): Payer: Medicare Other | Admitting: Endocrinology

## 2018-03-21 VITALS — BP 130/82 | HR 64 | Ht 64.0 in | Wt 194.0 lb

## 2018-03-21 DIAGNOSIS — E669 Obesity, unspecified: Secondary | ICD-10-CM

## 2018-03-21 DIAGNOSIS — E1169 Type 2 diabetes mellitus with other specified complication: Secondary | ICD-10-CM | POA: Diagnosis not present

## 2018-03-21 NOTE — Progress Notes (Signed)
Patient ID: [name], female   DOB: 05/18/1946, 72 y.o.   MRN: [id number]           Reason for Appointment: Follow up of prediabetes  Referring physician: [name]  History of Present Illness:          She has prediabetes and also was told to have background retinopathy  On her periodic general checkups she has had an A1c done regularly and these have been upper normal around 6.0  She was evaluated with a glucose tolerance test in 10/2015 which showed the following: Fasting glucose 122, one-hour glucose 202 hour glucose 147  She was referred to the dietitian for meal planning in 3/17  Recent history:  Her A1c is higher at 6.4, previously 30 at 6%  Fasting glucose is now 126, was 112, previously highest 122  She continues to be having difficulty with improving her lifestyle and not exercising for various reasons Has gained some weight She says that she is not controlling her portions and at times may be stress eating She has occasional lightheadedness and for this reason does not want to exercise as yet  Usually has been reluctant to consider medications for her prediabetes  She was asked to join the diabetes prevention program at the [facility] previously and she has not wanted to do so   Weight history:  Wt Readings from Last 3 Encounters:  03/21/18 194 lb (88 kg)  01/17/18 193 lb (87.5 kg)  11/20/17 188 lb (85.3 kg)    Glycemic levels:   Lab Results  Component Value Date   HGBA1C 6.4 03/14/2018   HGBA1C 6.0 08/17/2017   HGBA1C 6.0 02/24/2017   Lab Results  Component Value Date   LDLCALC 93 03/14/2018   CREATININE 0.77 11/20/2017       Allergies as of 03/21/2018      Reactions   Actonel [risedronate Sodium]    Upset stomach   Boniva [ibandronate Sodium]    cramp   Calcium Channel Blockers    Upset stomach   Compazine    Daughter reacts to compazine/pt does not want to take   Lyrica [pregabalin]    Dizzy   Tape    redness      Medication  List        Accurate as of 03/21/18 10:54 AM. Always use your most recent med list.          aspirin 81 MG EC tablet Take 81 mg by mouth daily.   clidinium-chlordiazePOXIDE 5-2.5 MG capsule Commonly known as:  LIBRAX Take 1 capsule by mouth 3 (three) times daily as needed.   denosumab 60 MG/ML Soln injection Commonly known as:  PROLIA Inject 60 mg into the skin every 6 (six) months. Administer in upper arm, thigh, or abdomen   diphenoxylate-atropine 2.5-0.025 MG tablet Commonly known as:  LOMOTIL TAKE 1 TABLET BY MOUTH 4 TIMES A DAY AS NEEDED FOR DIARRHEA OR LOOSE STOOLS   ipratropium 0.06 % nasal spray Commonly known as:  ATROVENT Place 2 sprays into the nose 3 (three) times daily.   LUMIGAN 0.01 % Soln Generic drug:  bimatoprost Place 1 drop into both eyes at bedtime.   NASCOBAL 500 MCG/0.1ML Soln Generic drug:  Cyanocobalamin USE 1 SPRAY IN 1 NOSTRIL  ONCE A WEEK   potassium chloride SA 20 MEQ tablet Commonly known as:  K-DUR,KLOR-CON Take 1 tablet (20 mEq total) by mouth daily.   propranolol ER 60 MG 24 hr capsule Commonly known as:  INDERAL LA Take 1 capsule (60 mg total) by mouth 2 (two) times daily.   RABEprazole 20 MG tablet Commonly known as:  ACIPHEX TAKE 1 TABLET BY MOUTH  DAILY   timolol 0.5 % ophthalmic gel-forming Commonly known as:  TIMOPTIC-XR Place 1 drop into both eyes daily.   triamcinolone cream 0.1 % Commonly known as:  KENALOG Apply 1 application topically 2 (two) times daily as needed (ear bacteria).   Vitamin D3 50000 units Caps Take 1 capsule by mouth every 30 (thirty) days.       Allergies:  Allergies  Allergen Reactions  . Actonel [Risedronate Sodium]     Upset stomach  . Boniva [Ibandronate Sodium]     cramp  . Calcium Channel Blockers     Upset stomach  . Compazine     Daughter reacts to compazine/pt does not want to take  . Lyrica [Pregabalin]     Dizzy   . Tape     redness    Past Medical History:  Diagnosis  Date  . Adenomatous colon polyp 04/08/2011  . Anemia   . Anxiety   . Cataract   . GERD (gastroesophageal reflux disease)   . Glaucoma (increased eye pressure)   . Heart murmur   . HTN (hypertension)   . IBS (irritable bowel syndrome)   . LBP (low back pain)   . Menopause   . Osteoporosis   . Ovarian cancer ([place]) 09/2008   Dr Gwyneth Revels  . Pancreatic cyst   . Vitamin B12 deficiency   . Vitamin D deficiency     Past Surgical History:  Procedure Laterality Date  . ABDOMINAL HYSTERECTOMY    . APPENDECTOMY    . CHOLECYSTECTOMY N/A 05/15/2017   Procedure: LAPAROSCOPIC CHOLECYSTECTOMY WITH INTRAOPERATIVE CHOLANGIOGRAM AND LYSIS OF ADHESIONS;  Surgeon: Fanny Skates, MD;  Location: [facility];  Service: General;  Laterality: N/A;  . ELBOW FRACTURE SURGERY     age 16- left elbow  . [place]  2001  . Ovarian Cancer [place]  09/2008    Family History  Problem Relation Age of Onset  . Alzheimer's disease Mother 38  . Other Mother 68       TAH for excessive bleeding  . Lung cancer Father        smoker; metastasis to stomach and other areas  . Heart attack Maternal Uncle   . Other Paternal Aunt        stomach issues  . Heart Problems Paternal Uncle   . Other Maternal Grandmother        stomach issues; +hysterectomy  . Heart attack Maternal Grandfather   . Infertility Daughter   . Stomach cancer Cousin        dx. mid-60s  . Other Cousin        stomach issues  . Leukemia Cousin 18  . Stomach cancer Other   . Cancer Cousin        unknown type  . Other Cousin        stomach issues  . Heart Problems Maternal Uncle   . Diabetes Paternal Aunt   . Heart Problems Paternal Uncle   . Emphysema Paternal Uncle        work exposure  . Breast cancer Cousin        dx. late 60s-early 70s  . Colon cancer Neg Hx   . Rectal cancer Neg Hx   . Esophageal cancer Neg Hx     Social History:  reports that she has never  smoked. She has never used smokeless tobacco. She reports that  she does not drink alcohol or use drugs.    Review of Systems     Lipid history: She has not been on any statin drugs, lipids at a good level    Lab Results  Component Value Date   CHOL 168 03/14/2018   HDL 46.70 03/14/2018   LDLCALC 93 03/14/2018   TRIG 145.0 03/14/2018   CHOLHDL 4 03/14/2018         RETINOPATHY: She is followed by ophthalmologist for background retinopathy   Review of Systems   LABS:  No visits with results within 1 Week(s) from this visit.  Latest known visit with results is:  Lab on 03/14/2018  Component Date Value Ref Range Status  . Cholesterol 03/14/2018 168  0 - 200 mg/dL Final   ATP III Classification       Desirable:  < 200 mg/dL               Borderline High:  200 - 239 mg/dL          High:  > = 240 mg/dL  . Triglycerides 03/14/2018 145.0  0.0 - 149.0 mg/dL Final   Normal:  <150 mg/dLBorderline High:  150 - 199 mg/dL  . HDL 03/14/2018 46.70  >39.00 mg/dL Final  . VLDL 03/14/2018 29.0  0.0 - 40.0 mg/dL Final  . LDL Cholesterol 03/14/2018 93  0 - 99 mg/dL Final  . Total CHOL/HDL Ratio 03/14/2018 4   Final                  Men          Women1/2 Average Risk     3.4          3.3Average Risk          5.0          4.42X Average Risk          9.6          7.13X Average Risk          15.0          11.0                      . NonHDL 03/14/2018 121.57   Final   NOTE:  Non-HDL goal should be 30 mg/dL higher than patient's LDL goal (i.e. LDL goal of < 70 mg/dL, would have non-HDL goal of < 100 mg/dL)  . Glucose, Bld 03/14/2018 126* 70 - 99 mg/dL Final  . Hgb A1c MFr Bld 03/14/2018 6.4  4.6 - 6.5 % Final   Glycemic Control Guidelines for People with Diabetes:Non Diabetic:  <6%Goal of Therapy: <7%Additional Action Suggested:  >8%     Physical Examination:  BP 130/82   Pulse 64   Ht 5\' 4"  (1.626 m)   Wt 194 lb (88 kg)   SpO2 96%   BMI 33.30 kg/m         ASSESSMENT/PLAN:  PREDIABETES with obesity:  Her A1c has been around 6% previously but now  6.4  Her fasting reading is actually more than 125 indicating that she may be progressing to diabetes now She is tending to gain weight and has not been able to follow her diet instructions are controlled portion Also for various other reasons has not done much exercise still not wanting to restart as yet  Discussed options for preventing diabetes progression including more aggressive lifestyle changes, weight  loss medications are metformin and she is currently wanting to not use medication She agrees to see the dietitian Also once she has less problems with lightheadedness she may be open to starting an exercise program  LIPIDS: LDL at target  Follow-up in 4 months   Elayne Snare 03/21/2018, 10:54 AM   Note: This office note was prepared with Dragon voice recognition system technology. Any transcriptional errors that result from this process are unintentional.

## 2018-03-22 DIAGNOSIS — D2239 Melanocytic nevi of other parts of face: Secondary | ICD-10-CM | POA: Diagnosis not present

## 2018-03-22 DIAGNOSIS — Z85828 Personal history of other malignant neoplasm of skin: Secondary | ICD-10-CM | POA: Diagnosis not present

## 2018-03-22 DIAGNOSIS — D225 Melanocytic nevi of trunk: Secondary | ICD-10-CM | POA: Diagnosis not present

## 2018-03-22 DIAGNOSIS — D1801 Hemangioma of skin and subcutaneous tissue: Secondary | ICD-10-CM | POA: Diagnosis not present

## 2018-03-22 DIAGNOSIS — L814 Other melanin hyperpigmentation: Secondary | ICD-10-CM | POA: Diagnosis not present

## 2018-03-22 DIAGNOSIS — L281 Prurigo nodularis: Secondary | ICD-10-CM | POA: Diagnosis not present

## 2018-03-22 DIAGNOSIS — D485 Neoplasm of uncertain behavior of skin: Secondary | ICD-10-CM | POA: Diagnosis not present

## 2018-03-22 DIAGNOSIS — L280 Lichen simplex chronicus: Secondary | ICD-10-CM | POA: Diagnosis not present

## 2018-03-22 DIAGNOSIS — L821 Other seborrheic keratosis: Secondary | ICD-10-CM | POA: Diagnosis not present

## 2018-03-24 ENCOUNTER — Ambulatory Visit: Payer: Self-pay | Admitting: Nurse Practitioner

## 2018-03-24 ENCOUNTER — Encounter: Payer: Self-pay | Admitting: Nurse Practitioner

## 2018-03-24 VITALS — BP 144/78 | HR 53 | Temp 98.8°F | Wt 193.0 lb

## 2018-03-24 DIAGNOSIS — R011 Cardiac murmur, unspecified: Secondary | ICD-10-CM

## 2018-03-24 NOTE — Patient Instructions (Addendum)
Heart Murmur Follow up with Dr. [name] or Dr. Sammuel Bailiff office on Monday for an appointment. Go to the ER if you develop chest pain, palpitations, shortness of breath, difficulty breathing, etc.   A heart murmur is an extra sound that is caused by chaotic blood flow. The murmur can be heard as a "hum" or "whoosh" sound when blood flows through the heart. The heart has four areas called chambers. Valves separate the upper and lower chambers from each other (tricuspid valve and mitral valve) and separate the lower chambers of the heart from pathways that lead away from the heart (aortic valve and pulmonary valve). Normally, the valves open to let blood flow through or out of your heart, and then they shut to keep the blood from flowing backward. There are two types of heart murmurs:  Innocent murmurs. Most people with this type of heart murmur do not have a heart problem. Many children have innocent heart murmurs. Your health care provider may suggest some basic testing to find out whether your murmur is an innocent murmur. If an innocent heart murmur is found, there is no need for further tests or treatment and no need to restrict activities or stop playing sports.  Abnormal murmurs. These types of murmurs can occur in children and adults. Abnormal murmurs may be a sign of a more serious heart condition, such as a heart defect present at birth (congenital defect) or heart valve disease.  What are the causes? This condition is caused by heart valves that are not working properly. In children, abnormal heart murmurs are typically caused by congenital defects. In adults, abnormal murmurs are usually from heart valve problems caused by disease, infection, or aging. Three types of heart valve defects can cause a murmur:  Regurgitation. This is when blood leaks back through the valve in the wrong direction.  Mitral valve prolapse. This is when the mitral valve of the heart has a loose flap and does not  close tightly.  Stenosis. This is when a valve does not open enough and blocks blood flow.  This condition may also be caused by:  Pregnancy.  Fever.  Overactive thyroid gland.  Anemia.  Exercise.  Rapid growth spurts (in children).  What are the signs or symptoms? Innocent murmurs do not cause symptoms, and many people with abnormal murmurs may or may not have symptoms. If symptoms do develop, they may include:  Shortness of breath.  Blue coloring of the skin, especially on the fingertips.  Chest pain.  Palpitations, or feeling a fluttering or skipped heartbeat.  Fainting.  Persistent cough.  Getting tired much faster than expected.  Swelling in the abdomen, feet, or ankles.  How is this diagnosed? This condition may be diagnosed during a routine physical or other exam. If your health care provider hears a murmur with a stethoscope, he or she will listen for:  Where the murmur is located in your heart.  How long the murmur lasts (duration).  When the murmur is heard during the heartbeat.  How loud the murmur is. This may help the health care provider figure out what is causing the murmur.  You may be referred to a heart specialist (cardiologist). You may also have other tests, including:  Electrocardiogram (ECG or EKG). This test measures the electrical activity of your heart.  Echocardiogram. This test uses high frequency sound waves to make pictures of your heart.  MRI or chest X-ray.  Cardiac catheterization. This test looks at blood flow through the heart.  For children and adults who have an abnormal heart murmur and want to stay active, it is important to complete testing, review test results, and receive recommendations from your health care provider. If heart disease is present, it may not be safe to play or be active. How is this treated? Heart murmurs themselves do not need treatment. In some cases, a heart murmur may go away on its own. If an  underlying problem or disease is causing the murmur, you may need treatment. If treatment is needed, it will depend on the type and severity of the disease or heart problem causing the murmur. Treatment may include:  Medicine.  Surgery.  Dietary and lifestyle changes.  Follow these instructions at home:  Talk with your health care provider before participating in sports or other activities that require a lot of effort and energy (are strenuous).  Learn as much as possible about your condition and any related diseases. Ask your health care provider if you may at risk for any medical emergencies.  Talk with your health care provider about what symptoms you should look out for.  It is up to you to get your test results. Ask your health care provider, or the department that is doing the test, when your results will be ready.  Keep all follow-up visits as told by your health care provider. This is important. Contact a health care provider if:  You feel light-headed.  You are frequently short of breath.  You feel more tired than usual.  You are having a hard time keeping up with normal activities or fitness routines.  You have swelling in your ankles or feet.  You have chest pain.  You notice that your heart often beats irregularly.  You develop any new symptoms. Get help right away if:  You develop severe chest pain.  You are having trouble breathing.  You have fainting spells.  Your symptoms suddenly get worse. These symptoms may represent a serious problem that is an emergency. Do not wait to see if the symptoms will go away. Get medical help right away. Call your local emergency services (911 in the [place] Do not drive yourself to the hospital. Summary  Normally, the heart valves open to let blood flow through or out of your heart, and then they shut to keep the blood from flowing backward.  Heart murmur is caused by heart valves that are not working properly.  You  may need treatment if an underlying problem or disease is causing the heart murmur. Treatment may include medicine, surgery, or dietary and lifestyle changes.  Talk with your health care provider before participating in sports or other activities that require a lot of effort and energy (are strenuous).  Talk with your health care provider about what symptoms you should watch out for. This information is not intended to replace advice given to you by your health care provider. Make sure you discuss any questions you have with your health care provider. Document Released: 09/29/2004 Document Revised: 08/10/2016 Document Reviewed: 08/10/2016 Elsevier Interactive Patient Education  [employer].

## 2018-03-24 NOTE — Progress Notes (Signed)
Subjective:    Patient ID: [name], female    DOB: 06/19/1946, 72 y.o.   MRN: [id number]  The patient is a 72 year old female who presents today with complaints of "purring "sounds with breathing.  The patient states she noticed this about 3 days ago, but felt that it probably would resolve on its own with increase fluids.  The patient states over the last 3 days the sound has remained, and is episodic with breathing.  The patient denies any symptoms such as fever, chills, congestion, coughing, sore throat or runny nose.  Patient further denies wheezing, sputum production, dyspnea on exertion, or shortness of breath.  The patient presents today to confirm there is no problems with her breathing.  The patient states she has not taken anything for her symptoms, as she does not know what to treat them with.  The patient states again "I have not been coughing or having any other upper respiratory symptoms to treat.  The patient states she has heard this noise with her breathing, and has even asked her husband to listen to her chest with the bare ear.  The patient states she does have some lightheadedness and dizziness that is been present for approximately over one year, the patient is seeking treatment and has an appointment with a neurologist for the same.  The patient went on to state that she did have an episode recently when her blood pressure was elevated, but states she was seen for that as well and that has since resolved.  The patient's primary care physician is Dr. [name], and she sees Dr. Dwyane Dee for her diabetes.  Reviewed the patient's past medical history, current medications, and allergies.  Review of Systems  Constitutional: Negative.   HENT: Negative.   Eyes: Negative.   Respiratory: Negative for cough, chest tightness, shortness of breath and wheezing.        "Purring "sound with breathing  Cardiovascular: Negative.   Skin: Negative.   Neurological: Positive for dizziness  and light-headedness. Negative for tremors, seizures, syncope, speech difficulty, weakness and numbness.       Objective:   Physical Exam  Constitutional: She is oriented to person, place, and time. She appears well-developed and well-nourished. No distress.  HENT:  Head: Normocephalic and atraumatic.  Right Ear: External ear normal.  Left Ear: External ear normal.  Eyes: Pupils are equal, round, and reactive to light. EOM are normal.  Neck: Normal range of motion. Neck supple. No tracheal deviation present. No thyromegaly present.  Cardiovascular: Normal rate and regular rhythm.  Murmur heard. Pulmonary/Chest: Effort normal and breath sounds normal. She has no wheezes. She has no rales.  Abdominal: Soft. Bowel sounds are normal.  Neurological: She is alert and oriented to person, place, and time.  Skin: Skin is warm and dry. Capillary refill takes 2 to 3 seconds.  Psychiatric: She has a normal mood and affect. Her behavior is normal.      Assessment & Plan:  Exam findings, diagnosis etiology and medication use and indications reviewed with patient. Follow- Up and discharge instructions provided. No emergent/urgent issues found on exam.  Discussed with patient the suspected murmur that was heard on exam.  Patient was instructed to follow-up with her PCP or cardiologist for an appointment on Monday to confirm this diagnosis and for possible further work-up if needed.  Instructed patient to follow-up in the ER if she develops shortness of breath, chest pain, palpitations, difficulty breathing, or other concerns. Patient verbalized understanding of  information provided and agrees with plan of care (POC), all questions answered.

## 2018-03-26 ENCOUNTER — Ambulatory Visit (INDEPENDENT_AMBULATORY_CARE_PROVIDER_SITE_OTHER)
Admission: RE | Admit: 2018-03-26 | Discharge: 2018-03-26 | Disposition: A | Discharge status: Home / Self Care | Payer: Medicare Other | Source: Ambulatory Visit | Attending: Internal Medicine | Admitting: Internal Medicine

## 2018-03-26 ENCOUNTER — Ambulatory Visit (INDEPENDENT_AMBULATORY_CARE_PROVIDER_SITE_OTHER): Payer: Medicare Other | Admitting: Internal Medicine

## 2018-03-26 ENCOUNTER — Encounter: Payer: Self-pay | Admitting: Internal Medicine

## 2018-03-26 DIAGNOSIS — R0989 Other specified symptoms and signs involving the circulatory and respiratory systems: Secondary | ICD-10-CM | POA: Diagnosis not present

## 2018-03-26 DIAGNOSIS — K580 Irritable bowel syndrome with diarrhea: Secondary | ICD-10-CM | POA: Diagnosis not present

## 2018-03-26 DIAGNOSIS — R1915 Other abnormal bowel sounds: Secondary | ICD-10-CM | POA: Diagnosis not present

## 2018-03-26 NOTE — Progress Notes (Signed)
Subjective:  Patient ID: [name], female    DOB: 08-24-1946  Age: 72 y.o. MRN: [id number]  CC: No chief complaint on file.   HPI [name] presents for a purring sound in the L lower chest since Wed last week off and on. No CP, SOB, cough   Outpatient Medications Prior to Visit  Medication Sig Dispense Refill  . aspirin 81 MG EC tablet Take 81 mg by mouth daily.      . bimatoprost (LUMIGAN) 0.01 % SOLN Place 1 drop into both eyes at bedtime.    . Cholecalciferol (VITAMIN D3) 50000 units CAPS Take 1 capsule by mouth every 30 (thirty) days. 3 capsule 3  . clidinium-chlordiazePOXIDE (LIBRAX) 5-2.5 MG capsule Take 1 capsule by mouth 3 (three) times daily as needed. 60 capsule 3  . denosumab (PROLIA) 60 MG/ML SOLN injection Inject 60 mg into the skin every 6 (six) months. Administer in upper arm, thigh, or abdomen    . diphenoxylate-atropine (LOMOTIL) 2.5-0.025 MG tablet TAKE 1 TABLET BY MOUTH 4 TIMES A DAY AS NEEDED FOR DIARRHEA OR LOOSE STOOLS 60 tablet 3  . ipratropium (ATROVENT) 0.06 % nasal spray Place 2 sprays into the nose 3 (three) times daily. 15 mL 2  . NASCOBAL 500 MCG/0.1ML SOLN USE 1 SPRAY IN 1 NOSTRIL  ONCE A WEEK 3 Bottle 0  . potassium chloride SA (K-DUR,KLOR-CON) 20 MEQ tablet Take 1 tablet (20 mEq total) by mouth daily. 30 tablet 11  . propranolol ER (INDERAL LA) 60 MG 24 hr capsule Take 1 capsule (60 mg total) by mouth 2 (two) times daily. 60 capsule 11  . RABEprazole (ACIPHEX) 20 MG tablet TAKE 1 TABLET BY MOUTH  DAILY 90 tablet 1  . timolol (TIMOPTIC-XR) 0.5 % ophthalmic gel-forming Place 1 drop into both eyes daily.     [name] triamcinolone cream (KENALOG) 0.1 % Apply 1 application topically 2 (two) times daily as needed (ear bacteria).      No facility-administered medications prior to visit.     ROS: Review of Systems  Constitutional: Positive for fatigue. Negative for activity change, appetite change, chills, diaphoresis, fever and unexpected weight  change.  HENT: Negative for congestion, dental problem, ear pain, hearing loss, mouth sores, postnasal drip, sinus pressure, sneezing, sore throat and voice change.   Eyes: Negative for pain and visual disturbance.  Respiratory: Negative for cough, chest tightness, wheezing and stridor.   Cardiovascular: Negative for chest pain, palpitations and leg swelling.  Gastrointestinal: Negative for abdominal distention, abdominal pain, blood in stool, nausea, rectal pain and vomiting.  Genitourinary: Negative for decreased urine volume, difficulty urinating, dysuria, frequency, hematuria, menstrual problem, vaginal bleeding, vaginal discharge and vaginal pain.  Musculoskeletal: Positive for arthralgias. Negative for back pain, gait problem, joint swelling and neck pain.  Skin: Negative for color change, pallor, rash and wound.  Neurological: Negative for dizziness, tremors, syncope, speech difficulty, weakness, light-headedness, numbness and headaches.  Hematological: Negative for adenopathy.  Psychiatric/Behavioral: Negative for behavioral problems, confusion, decreased concentration, dysphoric mood, hallucinations, sleep disturbance and suicidal ideas. The patient is not nervous/anxious and is not hyperactive.     Objective:  BP 140/78 (BP Location: Left Arm, Patient Position: Sitting, Cuff Size: Large)   Pulse (!) 59   Temp 97.7 F (36.5 C) (Oral)   Ht 5\' 4"  (1.626 m)   Wt 193 lb (87.5 kg)   SpO2 96%   BMI 33.13 kg/m   BP Readings from Last 3 Encounters:  03/26/18 140/78  03/24/18 (!) 144/78  03/21/18 130/82    Wt Readings from Last 3 Encounters:  03/26/18 193 lb (87.5 kg)  03/24/18 193 lb (87.5 kg)  03/21/18 194 lb (88 kg)    Physical Exam  Constitutional: She appears well-developed. No distress.  HENT:  Head: Normocephalic.  Right Ear: External ear normal.  Left Ear: External ear normal.  Nose: Nose normal.  Mouth/Throat: Oropharynx is clear and moist.  Eyes: Pupils are  equal, round, and reactive to light. Conjunctivae are normal. Right eye exhibits no discharge. Left eye exhibits no discharge.  Neck: Normal range of motion. Neck supple. No JVD present. No tracheal deviation present. No thyromegaly present.  Cardiovascular: Normal rate, regular rhythm and normal heart sounds.  Pulmonary/Chest: No stridor. No respiratory distress. She has no wheezes.  Abdominal: Soft. Bowel sounds are normal. She exhibits no distension and no mass. There is no tenderness. There is no rebound and no guarding.  Musculoskeletal: She exhibits no edema or tenderness.  Lymphadenopathy:    She has no cervical adenopathy.  Neurological: She displays normal reflexes. No cranial nerve deficit. She exhibits normal muscle tone. Coordination normal.  Skin: No rash noted. No erythema.  Psychiatric: She has a normal mood and affect. Her behavior is normal. Judgment and thought content normal.  I can here bowel sounds in the LUQ/lower chest 1/6 heart murmur  FTF>20 min discussing diff diagnosis   Lab Results  Component Value Date   WBC 7.4 11/20/2017   HGB 11.9 (L) 11/20/2017   HCT 34.8 (L) 11/20/2017   PLT 213.0 11/20/2017   GLUCOSE 126 (H) 03/14/2018   CHOL 168 03/14/2018   TRIG 145.0 03/14/2018   HDL 46.70 03/14/2018   LDLCALC 93 03/14/2018   ALT 18 10/25/2017   AST 17 10/25/2017   NA 142 11/20/2017   K 3.8 11/20/2017   CL 105 11/20/2017   CREATININE 0.77 11/20/2017   BUN 14 11/20/2017   CO2 28 11/20/2017   TSH 1.530 11/28/2016   HGBA1C 6.4 03/14/2018    Mm Screening Breast Tomo Bilateral  Result Date: 01/10/2018 CLINICAL DATA:  Screening. EXAM: DIGITAL SCREENING BILATERAL MAMMOGRAM WITH TOMO AND CAD COMPARISON:  Previous exam(s). ACR Breast Density Category b: There are scattered areas of fibroglandular density. FINDINGS: There are no findings suspicious for malignancy. Images were processed with CAD. IMPRESSION: No mammographic evidence of malignancy. A result letter of  this screening mammogram will be mailed directly to the patient. RECOMMENDATION: Screening mammogram in one year. (Code:SM-B-01Y) BI-RADS CATEGORY  1: Negative. Electronically Signed   By: Fidela Salisbury M.D.   On: 01/10/2018 14:59    Assessment & Plan:   There are no diagnoses linked to this encounter.   No orders of the defined types were placed in this encounter.    Follow-up: No follow-ups on file.  Walker Kehr, MD

## 2018-03-26 NOTE — Assessment & Plan Note (Signed)
I can here bowel sounds in the LUQ/lower chest

## 2018-03-26 NOTE — Assessment & Plan Note (Addendum)
I can here bowel sounds in the LUQ/lower chest Could be HH CXR CT if needed

## 2018-04-01 ENCOUNTER — Encounter (HOSPITAL_COMMUNITY): Payer: Self-pay | Admitting: Nurse Practitioner

## 2018-04-01 DIAGNOSIS — I10 Essential (primary) hypertension: Secondary | ICD-10-CM | POA: Insufficient documentation

## 2018-04-01 DIAGNOSIS — R232 Flushing: Secondary | ICD-10-CM | POA: Insufficient documentation

## 2018-04-01 DIAGNOSIS — Z9049 Acquired absence of other specified parts of digestive tract: Secondary | ICD-10-CM | POA: Diagnosis not present

## 2018-04-01 DIAGNOSIS — F419 Anxiety disorder, unspecified: Secondary | ICD-10-CM | POA: Insufficient documentation

## 2018-04-01 DIAGNOSIS — Z7982 Long term (current) use of aspirin: Secondary | ICD-10-CM | POA: Insufficient documentation

## 2018-04-01 DIAGNOSIS — R42 Dizziness and giddiness: Secondary | ICD-10-CM | POA: Diagnosis not present

## 2018-04-01 DIAGNOSIS — Z79899 Other long term (current) drug therapy: Secondary | ICD-10-CM | POA: Insufficient documentation

## 2018-04-01 NOTE — ED Triage Notes (Signed)
Pt states she had a sudden moment of lightheadedness and feeling flushed, this happened about 2 hours ago. She states she feels ok right now but last time she had a similar occurrence, she was admitted to the hospital for further evaluation. She denies CP, shortness of breath.

## 2018-04-02 ENCOUNTER — Other Ambulatory Visit: Payer: Self-pay

## 2018-04-02 ENCOUNTER — Emergency Department (HOSPITAL_COMMUNITY)
Admission: EM | Admit: 2018-04-02 | Discharge: 2018-04-02 | Disposition: A | Discharge status: Home / Self Care | Payer: Medicare Other | Attending: Emergency Medicine | Admitting: Emergency Medicine

## 2018-04-02 DIAGNOSIS — R232 Flushing: Secondary | ICD-10-CM | POA: Diagnosis not present

## 2018-04-02 LAB — CBC WITH DIFFERENTIAL/PLATELET
BASOS ABS: 0 10*3/uL (ref 0.0–0.1)
Basophils Relative: 1 %
Eosinophils Absolute: 0.1 10*3/uL (ref 0.0–0.7)
Eosinophils Relative: 1 %
HEMATOCRIT: 34.9 % — AB (ref 36.0–46.0)
Hemoglobin: 11.8 g/dL — ABNORMAL LOW (ref 12.0–15.0)
LYMPHS PCT: 28 %
Lymphs Abs: 1.7 10*3/uL (ref 0.7–4.0)
MCH: 29.4 pg (ref 26.0–34.0)
MCHC: 33.8 g/dL (ref 30.0–36.0)
MCV: 87 fL (ref 78.0–100.0)
MONO ABS: 0.4 10*3/uL (ref 0.1–1.0)
Monocytes Relative: 6 %
NEUTROS ABS: 4.1 10*3/uL (ref 1.7–7.7)
Neutrophils Relative %: 64 %
Platelets: 193 10*3/uL (ref 150–400)
RBC: 4.01 MIL/uL (ref 3.87–5.11)
RDW: 13.7 % (ref 11.5–15.5)
WBC: 6.3 10*3/uL (ref 4.0–10.5)

## 2018-04-02 LAB — URINALYSIS, ROUTINE W REFLEX MICROSCOPIC
BACTERIA UA: NONE SEEN
Bilirubin Urine: NEGATIVE
GLUCOSE, UA: NEGATIVE mg/dL
KETONES UR: NEGATIVE mg/dL
Nitrite: NEGATIVE
PH: 6 (ref 5.0–8.0)
Protein, ur: NEGATIVE mg/dL
SPECIFIC GRAVITY, URINE: 1.008 (ref 1.005–1.030)

## 2018-04-02 LAB — I-STAT TROPONIN, ED: Troponin i, poc: 0 ng/mL (ref 0.00–0.08)

## 2018-04-02 LAB — I-STAT CHEM 8, ED
BUN: 13 mg/dL (ref 8–23)
CHLORIDE: 106 mmol/L (ref 98–111)
Calcium, Ion: 1.18 mmol/L (ref 1.15–1.40)
Creatinine, Ser: 0.8 mg/dL (ref 0.44–1.00)
Glucose, Bld: 124 mg/dL — ABNORMAL HIGH (ref 70–99)
HEMATOCRIT: 33 % — AB (ref 36.0–46.0)
HEMOGLOBIN: 11.2 g/dL — AB (ref 12.0–15.0)
POTASSIUM: 3.6 mmol/L (ref 3.5–5.1)
Sodium: 145 mmol/L (ref 135–145)
TCO2: 26 mmol/L (ref 22–32)

## 2018-04-02 NOTE — ED Provider Notes (Signed)
[facility] DEPT Provider Note: Georgena Spurling, MD, FACEP  CSN: [id number] MRN: [id number] ARRIVAL: 04/01/18 at 2147 ROOM: [place]  Dizziness   HISTORY OF PRESENT ILLNESS  04/02/18 2:27 AM [name] is a 72 y.o. female who had an episode of feeling a rush of warmth in her head about 8 PM yesterday evening.  The onset was sudden and it only lasted a few seconds.  There were no associated visual changes, focal weakness, chest pain, shortness of breath or confusion.  She did feel somewhat shaky afterwards.  Nothing brought this on that she is aware of.  She has had similar episodes in the past but never this severe.  She has not had another episode since.   Past Medical History:  Diagnosis Date  . Adenomatous colon polyp 04/08/2011  . Anemia   . Anxiety   . Cataract   . GERD (gastroesophageal reflux disease)   . Glaucoma (increased eye pressure)   . Heart murmur   . HTN (hypertension)   . IBS (irritable bowel syndrome)   . LBP (low back pain)   . Menopause   . Osteoporosis   . Ovarian cancer ([place]) 09/2008   Dr Gwyneth Revels  . Pancreatic cyst   . Vitamin B12 deficiency   . Vitamin D deficiency     Past Surgical History:  Procedure Laterality Date  . ABDOMINAL HYSTERECTOMY    . APPENDECTOMY    . CHOLECYSTECTOMY N/A 05/15/2017   Procedure: LAPAROSCOPIC CHOLECYSTECTOMY WITH INTRAOPERATIVE CHOLANGIOGRAM AND LYSIS OF ADHESIONS;  Surgeon: Fanny Skates, MD;  Location: [facility];  Service: General;  Laterality: N/A;  . ELBOW FRACTURE SURGERY     age 33- left elbow  . [place]  2001  . Ovarian Cancer Debulking  09/2008    Family History  Problem Relation Age of Onset  . Alzheimer's disease Mother 35  . Other Mother 61       TAH for excessive bleeding  . Lung cancer Father        smoker; metastasis to stomach and other areas  . Heart attack Maternal Uncle   . Other Paternal Aunt        stomach issues  . Heart Problems Paternal Uncle   .  Other Maternal Grandmother        stomach issues; +hysterectomy  . Heart attack Maternal Grandfather   . Infertility Daughter   . Stomach cancer Cousin        dx. mid-60s  . Other Cousin        stomach issues  . Leukemia Cousin 18  . Stomach cancer Other   . Cancer Cousin        unknown type  . Other Cousin        stomach issues  . Heart Problems Maternal Uncle   . Diabetes Paternal Aunt   . Heart Problems Paternal Uncle   . Emphysema Paternal Uncle        work exposure  . Breast cancer Cousin        dx. late 60s-early 70s  . Colon cancer Neg Hx   . Rectal cancer Neg Hx   . Esophageal cancer Neg Hx     Social History   Tobacco Use  . Smoking status: Never Smoker  . Smokeless tobacco: Never Used  Substance Use Topics  . Alcohol use: No    Alcohol/week: 0.0 oz  . Drug use: No    Prior to Admission medications   Medication  Sig Start Date End Date Taking? Authorizing Provider  aspirin 81 MG EC tablet Take 81 mg by mouth daily.     Yes [provider]  bimatoprost (LUMIGAN) 0.01 % SOLN Place 1 drop into both eyes at bedtime.   Yes [provider]  Cholecalciferol (VITAMIN D3) 50000 units CAPS Take 1 capsule by mouth every 30 (thirty) days. 11/20/17  Yes [name], Evie Lacks, MD  clidinium-chlordiazePOXIDE (LIBRAX) 5-2.5 MG capsule Take 1 capsule by mouth 3 (three) times daily as needed. 11/20/17  Yes [name], Evie Lacks, MD  denosumab (PROLIA) 60 MG/ML SOLN injection Inject 60 mg into the skin every 6 (six) months. Administer in upper arm, thigh, or abdomen 09/02/13  Yes [provider]  diphenoxylate-atropine (LOMOTIL) 2.5-0.025 MG tablet TAKE 1 TABLET BY MOUTH 4 TIMES A DAY AS NEEDED FOR DIARRHEA OR LOOSE STOOLS 01/25/18  Yes [name], Evie Lacks, MD  ipratropium (ATROVENT) 0.06 % nasal spray Place 2 sprays into the nose 3 (three) times daily. 01/17/18 01/17/19 Yes [name], Evie Lacks, MD  NASCOBAL 500 MCG/0.1ML SOLN USE 1 SPRAY IN 1 NOSTRIL  ONCE A  WEEK 02/19/18  Yes [name], Evie Lacks, MD  propranolol ER (INDERAL LA) 60 MG 24 hr capsule Take 1 capsule (60 mg total) by mouth 2 (two) times daily. 11/20/17  Yes [name], Evie Lacks, MD  RABEprazole (ACIPHEX) 20 MG tablet TAKE 1 TABLET BY MOUTH  DAILY 01/22/18  Yes Irene Shipper, MD  timolol (TIMOPTIC-XR) 0.5 % ophthalmic gel-forming Place 1 drop into both eyes daily.  03/03/11  Yes [provider]  triamcinolone cream (KENALOG) 0.1 % Apply 1 application topically 2 (two) times daily as needed (ear bacteria).    Yes [provider]    Allergies Actonel [risedronate sodium]; Boniva [ibandronate sodium]; Calcium channel blockers; Compazine; Lyrica [pregabalin]; and Tape   REVIEW OF SYSTEMS  Negative except as noted here or in the History of Present Illness.   PHYSICAL EXAMINATION  Initial Vital Signs Blood pressure (!) 159/48, pulse (!) 52, temperature 97.8 F (36.6 C), temperature source Oral, resp. rate 16, height 5\' 4"  (1.626 m), weight 87.5 kg (193 lb), SpO2 99 %.  Examination General: Well-developed, well-nourished female in no acute distress; appearance consistent with age of record HENT: normocephalic; atraumatic Eyes: pupils equal, round and reactive to light; extraocular muscles intact Neck: supple Heart: regular rate and rhythm Lungs: clear to auscultation bilaterally Abdomen: soft; nondistended; nontender; no masses or hepatosplenomegaly; bowel sounds present Extremities: No deformity; full range of motion; pulses normal Neurologic: Awake, alert and oriented; motor function intact in all extremities and symmetric; no facial droop; normal coordination and speech; negative Romberg; normal finger-to-nose Skin: Warm and dry Psychiatric: Anxious   RESULTS  Summary of this visit's results, reviewed by myself:   EKG Interpretation  Date/Time:  Sunday April 01 2018 22:49:12 EDT Ventricular Rate:  70 PR Interval:    QRS Duration: 106 QT Interval:  398 QTC  Calculation: 430 R Axis:   33 Text Interpretation:  Sinus rhythm Low voltage, precordial leads Abnormal R-wave progression, early transition No significant change was found Confirmed by Alexxia Stankiewicz [phone]) on 04/01/2018 10:58:04 PM      Laboratory Studies: Results for orders placed or performed during the hospital encounter of 04/02/18 (from the past 24 hour(s))  CBC with Differential/Platelet     Status: Abnormal   Collection Time: 04/02/18  4:13 AM  Result Value Ref Range   WBC 6.3 4.0 - 10.5 K/uL   RBC 4.01  3.87 - 5.11 MIL/uL   Hemoglobin 11.8 (L) 12.0 - 15.0 g/dL   HCT 34.9 (L) 36.0 - 46.0 %   MCV 87.0 78.0 - 100.0 fL   MCH 29.4 26.0 - 34.0 pg   MCHC 33.8 30.0 - 36.0 g/dL   RDW 13.7 11.5 - 15.5 %   Platelets 193 150 - 400 K/uL   Neutrophils Relative % 64 %   Neutro Abs 4.1 1.7 - 7.7 K/uL   Lymphocytes Relative 28 %   Lymphs Abs 1.7 0.7 - 4.0 K/uL   Monocytes Relative 6 %   Monocytes Absolute 0.4 0.1 - 1.0 K/uL   Eosinophils Relative 1 %   Eosinophils Absolute 0.1 0.0 - 0.7 K/uL   Basophils Relative 1 %   Basophils Absolute 0.0 0.0 - 0.1 K/uL  Urinalysis, Routine w reflex microscopic     Status: Abnormal   Collection Time: 04/02/18  4:13 AM  Result Value Ref Range   Color, Urine STRAW (A) YELLOW   APPearance CLEAR CLEAR   Specific Gravity, Urine 1.008 1.005 - 1.030   pH 6.0 5.0 - 8.0   Glucose, UA NEGATIVE NEGATIVE mg/dL   Hgb urine dipstick SMALL (A) NEGATIVE   Bilirubin Urine NEGATIVE NEGATIVE   Ketones, ur NEGATIVE NEGATIVE mg/dL   Protein, ur NEGATIVE NEGATIVE mg/dL   Nitrite NEGATIVE NEGATIVE   Leukocytes, UA TRACE (A) NEGATIVE   RBC / HPF 0-5 0 - 5 RBC/hpf   WBC, UA 6-10 0 - 5 WBC/hpf   Bacteria, UA NONE SEEN NONE SEEN   Squamous Epithelial / LPF 0-5 0 - 5  I-stat troponin, ED     Status: None   Collection Time: 04/02/18  4:25 AM  Result Value Ref Range   Troponin i, poc 0.00 0.00 - 0.08 ng/mL   Comment 3          I-stat chem 8, ed     Status: Abnormal    Collection Time: 04/02/18  4:28 AM  Result Value Ref Range   Sodium 145 135 - 145 mmol/L   Potassium 3.6 3.5 - 5.1 mmol/L   Chloride 106 98 - 111 mmol/L   BUN 13 8 - 23 mg/dL   Creatinine, Ser 0.80 0.44 - 1.00 mg/dL   Glucose, Bld 124 (H) 70 - 99 mg/dL   Calcium, Ion 1.18 1.15 - 1.40 mmol/L   TCO2 26 22 - 32 mmol/L   Hemoglobin 11.2 (L) 12.0 - 15.0 g/dL   HCT 33.0 (L) 36.0 - 46.0 %   Imaging Studies: No results found.  ED COURSE and MDM  Nursing notes and initial vitals signs, including pulse oximetry, reviewed.  Vitals:   04/01/18 2250 04/02/18 0122 04/02/18 0200 04/02/18 0419  BP: (!) 160/62 (!) 159/48 (!) 152/64 (!) 142/52  Pulse: 69 (!) 52 (!) 53 (!) 48  Resp: 15 16 16 16   Temp: 97.8 F (36.6 C)     TempSrc: Oral     SpO2: 96% 99% 98% 97%  Weight: 87.5 kg (193 lb)     Height: 5\' 4"  (1.626 m)      Patient advised of reassuring lab studies.  Her symptoms are not consistent with a stroke or even a TIA.  The cause of her symptoms is not clear.  She was advised to keep a log of abnormal symptoms with time and context and follow-up with her physician.  She was advised to return for worsening symptoms.  PROCEDURES    ED DIAGNOSES  ICD-10-CM   1. [place] R23.2        Shanon Rosser, MD 04/02/18 [phone]

## 2018-04-02 NOTE — ED Notes (Signed)
Will notify RN of heart rate.

## 2018-04-11 DIAGNOSIS — H40013 Open angle with borderline findings, low risk, bilateral: Secondary | ICD-10-CM | POA: Diagnosis not present

## 2018-04-12 ENCOUNTER — Encounter

## 2018-04-12 ENCOUNTER — Encounter: Payer: Self-pay | Admitting: Neurology

## 2018-04-12 ENCOUNTER — Ambulatory Visit (INDEPENDENT_AMBULATORY_CARE_PROVIDER_SITE_OTHER): Payer: Medicare Other | Admitting: Neurology

## 2018-04-12 ENCOUNTER — Other Ambulatory Visit: Payer: Self-pay

## 2018-04-12 ENCOUNTER — Telehealth: Payer: Self-pay | Admitting: Neurology

## 2018-04-12 VITALS — BP 141/80 | HR 62 | Ht 64.0 in | Wt 191.5 lb

## 2018-04-12 DIAGNOSIS — H814 Vertigo of central origin: Secondary | ICD-10-CM

## 2018-04-12 DIAGNOSIS — R42 Dizziness and giddiness: Secondary | ICD-10-CM | POA: Diagnosis not present

## 2018-04-12 DIAGNOSIS — H8149 Vertigo of central origin, unspecified ear: Secondary | ICD-10-CM | POA: Diagnosis not present

## 2018-04-12 MED ORDER — ALPRAZOLAM 0.5 MG PO TABS: ORAL_TABLET | ORAL | 0 refills | Status: DC

## 2018-04-12 NOTE — Progress Notes (Signed)
Reason for visit: Dizziness  Referring physician: Dr. Janet Berlin is a 72 y.o. female  History of present illness:  Ms. Birenbaum is a 72 year old right-handed white female with a history of chronic dizziness that began on 19 October 2016.  The patient had a sudden onset of a hot sensation from the ears up and had almost a pressure sensation and diaphoresis.  The patient did not have significant flushing or blanching of the face.  The patient has had occasional episodes similar to what was described above, the most recent event was about 10 days ago.  The patient also has chronic baseline lightheaded sensations that generally are present with sitting and standing, and they tend to go away with lying down.  The patient has some residual numbness in the feet associated with chemotherapy 9 years ago when she was diagnosed with stage III ovarian cancer.  The patient denies any headaches per se.  She denies palpitations of the heart.  She was seen by Dr. Caryl Comes, she underwent a cardiac monitor study last year without any abnormalities noted.  The patient reports some incontinence of the bladder and occasional fecal incontinence.  She does have irritable bowel syndrome.  She does have some occasional low back pain.  She denies any severe balance problems or any falls.  She denies true vertigo.  She has some slight tinnitus in the left ear, she was seen by Dr. Redmond Baseman from ENT in June 2019.  No source of her dizziness was noted.  The patient comes to this office for an evaluation.   Past Medical History:  Diagnosis Date  . Adenomatous colon polyp 04/08/2011  . Anemia   . Anxiety   . Cataract   . GERD (gastroesophageal reflux disease)   . Glaucoma (increased eye pressure)   . Heart murmur   . HTN (hypertension)   . IBS (irritable bowel syndrome)   . LBP (low back pain)   . Menopause   . Osteoporosis   . Ovarian cancer ([place]) 09/2008   Dr Gwyneth Revels  . Pancreatic cyst   . Vitamin B12  deficiency   . Vitamin D deficiency     Past Surgical History:  Procedure Laterality Date  . ABDOMINAL HYSTERECTOMY    . APPENDECTOMY    . CHOLECYSTECTOMY N/A 05/15/2017   Procedure: LAPAROSCOPIC CHOLECYSTECTOMY WITH INTRAOPERATIVE CHOLANGIOGRAM AND LYSIS OF ADHESIONS;  Surgeon: Fanny Skates, MD;  Location: [facility];  Service: General;  Laterality: N/A;  . ELBOW FRACTURE SURGERY     age 65- left elbow  . [place]  2001  . Ovarian Cancer Debulking  09/2008    Family History  Problem Relation Age of Onset  . Alzheimer's disease Mother 59  . Other Mother 40       TAH for excessive bleeding  . Lung cancer Father        smoker; metastasis to stomach and other areas  . Heart attack Maternal Uncle   . Other Paternal Aunt        stomach issues  . Heart Problems Paternal Uncle   . Other Maternal Grandmother        stomach issues; +hysterectomy  . Heart attack Maternal Grandfather   . Infertility Daughter   . Stomach cancer Cousin        dx. mid-60s  . Other Cousin        stomach issues  . Leukemia Cousin 18  . Stomach cancer Other   . Cancer Cousin  unknown type  . Other Cousin        stomach issues  . Heart Problems Maternal Uncle   . Diabetes Paternal Aunt   . Heart Problems Paternal Uncle   . Emphysema Paternal Uncle        work exposure  . Breast cancer Cousin        dx. late 60s-early 70s  . Colon cancer Neg Hx   . Rectal cancer Neg Hx   . Esophageal cancer Neg Hx     Social history:  reports that she has never smoked. She has never used smokeless tobacco. She reports that she does not drink alcohol or use drugs.  Medications:  Prior to Admission medications   Medication Sig Start Date End Date Taking? Authorizing Provider  aspirin 81 MG EC tablet Take 81 mg by mouth daily.     Yes [provider]  bimatoprost (LUMIGAN) 0.01 % SOLN Place 1 drop into both eyes at bedtime.   Yes [provider]  Cholecalciferol (VITAMIN D3) 50000  units CAPS Take 1 capsule by mouth every 30 (thirty) days. 11/20/17  Yes [name], Evie Lacks, MD  clidinium-chlordiazePOXIDE (LIBRAX) 5-2.5 MG capsule Take 1 capsule by mouth 3 (three) times daily as needed. 11/20/17  Yes [name], Evie Lacks, MD  denosumab (PROLIA) 60 MG/ML SOLN injection Inject 60 mg into the skin every 6 (six) months. Administer in upper arm, thigh, or abdomen 09/02/13  Yes [provider]  diphenoxylate-atropine (LOMOTIL) 2.5-0.025 MG tablet TAKE 1 TABLET BY MOUTH 4 TIMES A DAY AS NEEDED FOR DIARRHEA OR LOOSE STOOLS 01/25/18  Yes [name], Evie Lacks, MD  ipratropium (ATROVENT) 0.06 % nasal spray Place 2 sprays into the nose 3 (three) times daily. 01/17/18 01/17/19 Yes [name], Evie Lacks, MD  NASCOBAL 500 MCG/0.1ML SOLN USE 1 SPRAY IN 1 NOSTRIL  ONCE A WEEK 02/19/18  Yes [name], Evie Lacks, MD  propranolol ER (INDERAL LA) 60 MG 24 hr capsule Take 1 capsule (60 mg total) by mouth 2 (two) times daily. 11/20/17  Yes [name], Evie Lacks, MD  RABEprazole (ACIPHEX) 20 MG tablet TAKE 1 TABLET BY MOUTH  DAILY 01/22/18  Yes Irene Shipper, MD  timolol (TIMOPTIC-XR) 0.5 % ophthalmic gel-forming Place 1 drop into both eyes daily.  03/03/11  Yes [provider]  triamcinolone cream (KENALOG) 0.1 % Apply 1 application topically 2 (two) times daily as needed (ear bacteria).    Yes [provider]      Allergies  Allergen Reactions  . Actonel [Risedronate Sodium]     Upset stomach  . Boniva [Ibandronate Sodium]     cramp  . Calcium Channel Blockers     Upset stomach  . Compazine     Daughter reacts to compazine/pt does not want to take  . Lyrica [Pregabalin]     Dizzy   . Tape     redness    ROS:  Out of a complete 14 system review of symptoms, the patient complains only of the following symptoms, and all other reviewed systems are negative.  Weight gain, fatigue Heart murmur Ringing in the ears Diarrhea Incontinence of the bladder Anemia Feeling  hot, flushing Runny nose Dizziness, numbness Anxiety, decreased energy  Blood pressure (!) 141/80, pulse 62, height 5\' 4"  (1.626 m), weight 191 lb 8 oz (86.9 kg), SpO2 98 %.   Blood pressure, right arm, sitting is 204/98.  Blood pressure, right arm, standing is 220/100.  Physical Exam  General: The patient is alert  and cooperative at the time of the examination.  The patient is moderately to markedly obese.  Eyes: Pupils are equal, round, and reactive to light. Discs are flat bilaterally.  Neck: The neck is supple, no carotid bruits are noted.  Respiratory: The respiratory examination is clear.  Cardiovascular: The cardiovascular examination reveals a regular rate and rhythm, no obvious murmurs or rubs are noted.  Skin: Extremities are without significant edema.  Neurologic Exam  Mental status: The patient is alert and oriented x 3 at the time of the examination. The patient has apparent normal recent and remote memory, with an apparently normal attention span and concentration ability.  Cranial nerves: Facial symmetry is present. There is good sensation of the face to pinprick and soft touch bilaterally. The strength of the facial muscles and the muscles to head turning and shoulder shrug are normal bilaterally. Speech is well enunciated, no aphasia or dysarthria is noted. Extraocular movements are full. Visual fields are full. The tongue is midline, and the patient has symmetric elevation of the soft palate. No obvious hearing deficits are noted.  Motor: The motor testing reveals 5 over 5 strength of all 4 extremities. Good symmetric motor tone is noted throughout.  Sensory: Sensory testing is intact to pinprick, soft touch, vibration sensation, and position sense on all 4 extremities. No evidence of extinction is noted.  Coordination: Cerebellar testing reveals good finger-nose-finger and heel-to-shin bilaterally.  Gait and station: Gait is normal. Tandem gait is normal. Romberg  is negative. No drift is seen.  Reflexes: Deep tendon reflexes are symmetric and normal bilaterally. Toes are downgoing bilaterally.   Assessment/Plan:  1.  Chronic nonspecific dizziness  2.  Episodes of flushing, head pressure  3.  Severe hypertension  The patient appears to have severe spikes in blood pressure that may occur at times, blood pressure today was 353 systolic standing.  The patient will be set up for MRI of the brain, she will have carotid Doppler study.  I would wonder if a further evaluation into the blood pressure spikes may be warranted, the patient may benefit from a nephrology evaluation. The patient will follow-up here in about 4 months.  Jill Alexanders MD 04/12/2018 11:53 AM  [facility] [address] [place], [place] [address]  Phone [phone] Fax [phone]

## 2018-04-12 NOTE — Telephone Encounter (Signed)
Medicare/[employer] auth :NRP via [facility] website order sent to GI. They will reach out to the pt to schedule.

## 2018-04-17 ENCOUNTER — Ambulatory Visit (HOSPITAL_COMMUNITY)
Admission: RE | Admit: 2018-04-17 | Discharge: 2018-04-17 | Disposition: A | Discharge status: Home / Self Care | Payer: Medicare Other | Source: Ambulatory Visit | Attending: Neurology | Admitting: Neurology

## 2018-04-17 DIAGNOSIS — R42 Dizziness and giddiness: Secondary | ICD-10-CM | POA: Insufficient documentation

## 2018-04-17 NOTE — Progress Notes (Addendum)
Bilateral carotid duplex completed - Preliminary results - Right -  There is no evidence of a significant ICA stenosis. Vertebral artery flow is anterade. Left - There is a 1% to 39% lower end of scale ICA stenosis. Vertebral artery flow is antegrade. [facility]. RVS 04/17/2018 3:37 PM

## 2018-04-18 ENCOUNTER — Ambulatory Visit (INDEPENDENT_AMBULATORY_CARE_PROVIDER_SITE_OTHER): Payer: Medicare Other | Admitting: Internal Medicine

## 2018-04-18 ENCOUNTER — Encounter: Payer: Self-pay | Admitting: Internal Medicine

## 2018-04-18 ENCOUNTER — Ambulatory Visit
Admission: RE | Admit: 2018-04-18 | Discharge: 2018-04-18 | Disposition: A | Discharge status: Home / Self Care | Payer: Medicare Other | Source: Ambulatory Visit | Attending: Neurology | Admitting: Neurology

## 2018-04-18 ENCOUNTER — Telehealth: Payer: Self-pay | Admitting: Neurology

## 2018-04-18 DIAGNOSIS — R0989 Other specified symptoms and signs involving the circulatory and respiratory systems: Secondary | ICD-10-CM | POA: Diagnosis not present

## 2018-04-18 DIAGNOSIS — R5381 Other malaise: Secondary | ICD-10-CM | POA: Insufficient documentation

## 2018-04-18 DIAGNOSIS — F410 Panic disorder [episodic paroxysmal anxiety] without agoraphobia: Secondary | ICD-10-CM

## 2018-04-18 DIAGNOSIS — E559 Vitamin D deficiency, unspecified: Secondary | ICD-10-CM

## 2018-04-18 DIAGNOSIS — R42 Dizziness and giddiness: Secondary | ICD-10-CM

## 2018-04-18 DIAGNOSIS — H814 Vertigo of central origin: Secondary | ICD-10-CM

## 2018-04-18 DIAGNOSIS — I10 Essential (primary) hypertension: Secondary | ICD-10-CM | POA: Insufficient documentation

## 2018-04-18 DIAGNOSIS — R55 Syncope and collapse: Secondary | ICD-10-CM | POA: Diagnosis not present

## 2018-04-18 DIAGNOSIS — E538 Deficiency of other specified B group vitamins: Secondary | ICD-10-CM

## 2018-04-18 NOTE — Assessment & Plan Note (Signed)
I suggested a recumbent bike

## 2018-04-18 NOTE — Telephone Encounter (Signed)
I called the patient.  The carotid Doppler study is unremarkable.  MRI of the brain has been done, by my reading there appears to be minimal white matter changes, there is no evidence of ischemic changes in the brainstem that would cause dizziness.  The patient continues to have spikes on her blood pressure that occur suddenly, etiology of this is unclear.  Further investigation may be indicated, the patient will be seeing her primary care physician today.   Carotid doppler 04/18/18:  Final Interpretation: Right Carotid: There is no evidence of stenosis in the right ICA.  Left Carotid: Velocities in the left ICA are consistent with a 1-39% stenosis.  Vertebrals: Bilateral vertebral arteries demonstrate antegrade flow. Subclavians: Normal flow hemodynamics were seen in bilateral subclavian       arteries.

## 2018-04-18 NOTE — Assessment & Plan Note (Addendum)
Librax prn - pt agreed to try

## 2018-04-18 NOTE — Assessment & Plan Note (Signed)
On b12 

## 2018-04-18 NOTE — Assessment & Plan Note (Signed)
No change S/p Cardiol eval in 2018

## 2018-04-18 NOTE — Progress Notes (Signed)
Subjective:  Patient ID: [name], female    DOB: [birth year]  Age: 72 y.o. MRN: [id number]  CC: No chief complaint on file.   HPI [name] presents for dizziness, BP spikes, weakness f/u The pt saw Dr Jannifer Franklin and had a brain MRI/carot doppler - ok. Nephrology ref was recommended. No falls  Outpatient Medications Prior to Visit  Medication Sig Dispense Refill  . ALPRAZolam (XANAX) 0.5 MG tablet Take 2 tablets approximately 45 minutes prior to the MRI study, take a third tablet if needed. 3 tablet 0  . aspirin 81 MG EC tablet Take 81 mg by mouth daily.      . bimatoprost (LUMIGAN) 0.01 % SOLN Place 1 drop into both eyes at bedtime.    . Cholecalciferol (VITAMIN D3) 50000 units CAPS Take 1 capsule by mouth every 30 (thirty) days. 3 capsule 3  . clidinium-chlordiazePOXIDE (LIBRAX) 5-2.5 MG capsule Take 1 capsule by mouth 3 (three) times daily as needed. 60 capsule 3  . denosumab (PROLIA) 60 MG/ML SOLN injection Inject 60 mg into the skin every 6 (six) months. Administer in upper arm, thigh, or abdomen    . diphenoxylate-atropine (LOMOTIL) 2.5-0.025 MG tablet TAKE 1 TABLET BY MOUTH 4 TIMES A DAY AS NEEDED FOR DIARRHEA OR LOOSE STOOLS 60 tablet 3  . ipratropium (ATROVENT) 0.06 % nasal spray Place 2 sprays into the nose 3 (three) times daily. 15 mL 2  . NASCOBAL 500 MCG/0.1ML SOLN USE 1 SPRAY IN 1 NOSTRIL  ONCE A WEEK 3 Bottle 0  . propranolol ER (INDERAL LA) 60 MG 24 hr capsule Take 1 capsule (60 mg total) by mouth 2 (two) times daily. 60 capsule 11  . RABEprazole (ACIPHEX) 20 MG tablet TAKE 1 TABLET BY MOUTH  DAILY 90 tablet 1  . timolol (TIMOPTIC-XR) 0.5 % ophthalmic gel-forming Place 1 drop into both eyes daily.     [name] triamcinolone cream (KENALOG) 0.1 % Apply 1 application topically 2 (two) times daily as needed (ear bacteria).      No facility-administered medications prior to visit.     ROS: Review of Systems  Constitutional: Positive for fatigue. Negative for activity  change, appetite change, chills and unexpected weight change.  HENT: Negative for congestion, mouth sores and sinus pressure.   Eyes: Negative for visual disturbance.  Respiratory: Negative for cough and chest tightness.   Gastrointestinal: Negative for abdominal pain and nausea.  Genitourinary: Negative for difficulty urinating, frequency and vaginal pain.  Musculoskeletal: Negative for back pain and gait problem.  Skin: Negative for pallor and rash.  Neurological: Positive for dizziness, weakness and light-headedness. Negative for tremors, numbness and headaches.  Psychiatric/Behavioral: Negative for confusion, sleep disturbance and suicidal ideas. The patient is nervous/anxious.     Objective:  BP 136/72 (BP Location: Left Arm, Patient Position: Sitting, Cuff Size: Large)   Pulse (!) 53   Temp 98 F (36.7 C) (Oral)   Ht 5\' 4"  (1.626 m)   Wt 191 lb (86.6 kg)   SpO2 96%   BMI 32.79 kg/m   BP Readings from Last 3 Encounters:  04/18/18 136/72  04/12/18 (!) 141/80  04/02/18 (!) 148/57    Wt Readings from Last 3 Encounters:  04/18/18 191 lb (86.6 kg)  04/12/18 191 lb 8 oz (86.9 kg)  04/01/18 193 lb (87.5 kg)    Physical Exam  Constitutional: She appears well-developed. No distress.  HENT:  Head: Normocephalic.  Right Ear: External ear normal.  Left Ear: External ear normal.  Nose: Nose normal.  Mouth/Throat: Oropharynx is clear and moist.  Eyes: Pupils are equal, round, and reactive to light. Conjunctivae are normal. Right eye exhibits no discharge. Left eye exhibits no discharge.  Neck: Normal range of motion. Neck supple. No JVD present. No tracheal deviation present. No thyromegaly present.  Cardiovascular: Normal rate, regular rhythm and normal heart sounds.  Pulmonary/Chest: No stridor. No respiratory distress. She has no wheezes.  Abdominal: Soft. Bowel sounds are normal. She exhibits no distension and no mass. There is no tenderness. There is no rebound and no  guarding.  Musculoskeletal: She exhibits no edema or tenderness.  Lymphadenopathy:    She has no cervical adenopathy.  Neurological: She displays normal reflexes. No cranial nerve deficit. She exhibits normal muscle tone. Coordination abnormal.  Skin: No rash noted. No erythema.  Psychiatric: She has a normal mood and affect. Her behavior is normal. Judgment and thought content normal.  almost tearfull Frustrated w/no diagnosis yet of her problem  Lab Results  Component Value Date   WBC 6.3 04/02/2018   HGB 11.2 (L) 04/02/2018   HCT 33.0 (L) 04/02/2018   PLT 193 04/02/2018   GLUCOSE 124 (H) 04/02/2018   CHOL 168 03/14/2018   TRIG 145.0 03/14/2018   HDL 46.70 03/14/2018   LDLCALC 93 03/14/2018   ALT 18 10/25/2017   AST 17 10/25/2017   NA 145 04/02/2018   K 3.6 04/02/2018   CL 106 04/02/2018   CREATININE 0.80 04/02/2018   BUN 13 04/02/2018   CO2 28 11/20/2017   TSH 1.530 11/28/2016   HGBA1C 6.4 03/14/2018    No results found.  Assessment & Plan:   There are no diagnoses linked to this encounter.   No orders of the defined types were placed in this encounter.    Follow-up: No follow-ups on file.  Walker Kehr, MD

## 2018-04-18 NOTE — Assessment & Plan Note (Addendum)
2018-19 refractory Nephrology ref Consider repeat abd CT/MRI Treat deconditioning

## 2018-04-18 NOTE — Assessment & Plan Note (Signed)
Vit D 

## 2018-04-20 ENCOUNTER — Telehealth: Payer: Self-pay | Admitting: Neurology

## 2018-04-20 NOTE — Telephone Encounter (Signed)
  I called the patient.  The MRI of the brain shows minimal white matter changes, no evidence of significant brainstem ischemia that would result in dizziness.  The patient will be seeing a nephrologist for work-up of the blood pressure spikes, this could be the source of the episodes of dizziness.   MRI brain 04/19/18:  IMPRESSION:   MRI brain (without) demonstrating: - Mild scattered periventricular and subcortical foci of non-specific gliosis.  - No acute findings.

## 2018-04-27 ENCOUNTER — Encounter: Payer: Medicare Other | Attending: Endocrinology | Admitting: Dietician

## 2018-04-27 ENCOUNTER — Encounter: Payer: Self-pay | Admitting: Dietician

## 2018-04-27 DIAGNOSIS — R7303 Prediabetes: Secondary | ICD-10-CM

## 2018-04-27 DIAGNOSIS — E669 Obesity, unspecified: Secondary | ICD-10-CM | POA: Diagnosis not present

## 2018-04-27 DIAGNOSIS — E1169 Type 2 diabetes mellitus with other specified complication: Secondary | ICD-10-CM | POA: Insufficient documentation

## 2018-04-27 DIAGNOSIS — Z713 Dietary counseling and surveillance: Secondary | ICD-10-CM | POA: Insufficient documentation

## 2018-04-27 DIAGNOSIS — I10 Essential (primary) hypertension: Secondary | ICD-10-CM

## 2018-04-27 NOTE — Patient Instructions (Addendum)
Plan on some form of activity most days for 30 minutes. Consider walking at the church Walk in the house.  Baby steps.  It all adds up. Google Arm chair exercise or walking video Gardening  Stay hydrated.  Water is your best beverage.  Fresh meat rather than frozen or read label for sodium Continue avoiding added salt and choosing lower sodium when eating out.  Consider Calorie [employer] app Continue to read labels for fat, sodium, portion size

## 2018-04-27 NOTE — Progress Notes (Signed)
Medical Nutrition Therapy:  Appt start time: 1030 end time:  3295.   Assessment:  Primary concerns today: Patient is here with her husband to learn more about nutrition for prediabetes and HTN.  Other history includes osteoporosis, GERD, retinopathy, ovarain cancer 9 years ago, neuropathy, vitamin B-12 deficiency, vitamin D deficiency, IBS-D.   She was last seen by me 11/2015.   Her A1C has increased from 6.0% to 6.4%.  She is having problems with her blood sugar each morning around 11:00 and it was 173/94 today here in the office as patient could tell it was going up.  She can contribute nothing to this. Weight 192 lbs stable. 191 lbs 11/2015 and lost to 185 lbs but regained when she went on a six small meals per day diet this spring.  Patient lives with her husband.  He shops and she cooks.  Daughter Anderson Malta works for [employer] and lives nearby.  They also eat out frequently.  She asks for food without added salt and fat.  They often split a meal.  Stress eating is an issue at times.  Preferred Learning Style:   No preference indicated   Learning Readiness:   Ready  Change in progress   MEDICATIONS: see list including vitamin B-12 and vitamin D   DIETARY INTAKE:  Avoided foods include not a lot of milk.  Added salt. Often out to eat.  24-hr recall:  B ( AM): Instant Oatmeal with a small amount of butter and 1 ounce milk OR rice Krispies with peaches and strawberries and boiled egg or toast  OR occasional powdered sugar donut Snk ( AM): OJ and stone wheat cracker with cheese  L ( PM): chicken salad or egg salad sandwich OR salad from [place] or [employer] grilled chicken salad Snk ( PM): skinny popcorn, stone ground crackers, 1 cookie if around D ( PM): fish (broiled)-precooked from restaurant, sweet potato or baked potato, vegetable, salad OR chicken, potato, vegetable OR stuffed pepper, vegetable Snk ( PM): skinny popcorn or cracker, fruit, or canned fruit Beverages: water,   4 ounces regular Coke, OJ, lite cranberry juice  Usual physical activity: none  Estimated energy needs: 1400 calories 158 g carbohydrates 88 g protein 47 g fat  Progress Towards Goal(s):  In progress.   Nutritional Diagnosis:  NB-1.1 Food and nutrition-related knowledge deficit As related to balance of nutrition for weight management, HTN, and prevention of diabetes.  As evidenced by diet hx and patient report.    Intervention:  Nutrition counseling/education continued.  Discussed the importance of increasing activity in some way each day.  There are many barriers such as bowel/bladder, motivation, heat, and fear of feeling bad with blood pressure.  Discussed that exercise is good for insulin resistance and HTN.  Reviewed what insulin resistance is.  Discussed to continue to decrease fat and salt.  Discussed mindful eating.  Avoid eating when not hungry.    Plan on some form of activity most days for 30 minutes. Consider walking at the church Walk in the house.  Baby steps.  It all adds up. Google Arm chair exercise or walking video Gardening Stay hydrated.  Water is your best beverage. Fresh meat rather than frozen or read label for sodium Continue avoiding added salt and choosing lower sodium when eating out. Consider Calorie [employer] app Continue to read labels for fat, sodium, portion size  Teaching Method Utilized:  Visual Auditory  Handouts given during visit include:  HTN nutrition therapy from AND  Low Sodium nutrition therapy from AND  Heart Healthy Label Reading from AND  Barriers to learning/adherence to lifestyle change: motivation, IBS-D, stress, fear  Demonstrated degree of understanding via:  Teach Back   Monitoring/Evaluation:  Dietary intake, exercise, label reading, and body weight prn.

## 2018-05-16 NOTE — Progress Notes (Signed)
I personally reviewed the Note/Plan and I agree.  

## 2018-06-06 ENCOUNTER — Other Ambulatory Visit: Payer: Self-pay | Admitting: Internal Medicine

## 2018-07-03 DIAGNOSIS — R0989 Other specified symptoms and signs involving the circulatory and respiratory systems: Secondary | ICD-10-CM | POA: Diagnosis not present

## 2018-07-16 ENCOUNTER — Encounter: Payer: Self-pay | Admitting: Internal Medicine

## 2018-07-16 ENCOUNTER — Ambulatory Visit (INDEPENDENT_AMBULATORY_CARE_PROVIDER_SITE_OTHER): Payer: Medicare Other | Admitting: Internal Medicine

## 2018-07-16 VITALS — BP 130/72 | HR 67 | Temp 98.0°F | Ht 64.0 in | Wt 194.0 lb

## 2018-07-16 DIAGNOSIS — F410 Panic disorder [episodic paroxysmal anxiety] without agoraphobia: Secondary | ICD-10-CM

## 2018-07-16 DIAGNOSIS — L659 Nonscarring hair loss, unspecified: Secondary | ICD-10-CM | POA: Insufficient documentation

## 2018-07-16 DIAGNOSIS — K580 Irritable bowel syndrome with diarrhea: Secondary | ICD-10-CM

## 2018-07-16 DIAGNOSIS — Z23 Encounter for immunization: Secondary | ICD-10-CM | POA: Diagnosis not present

## 2018-07-16 DIAGNOSIS — E538 Deficiency of other specified B group vitamins: Secondary | ICD-10-CM

## 2018-07-16 DIAGNOSIS — R0989 Other specified symptoms and signs involving the circulatory and respiratory systems: Secondary | ICD-10-CM | POA: Diagnosis not present

## 2018-07-16 DIAGNOSIS — E559 Vitamin D deficiency, unspecified: Secondary | ICD-10-CM | POA: Diagnosis not present

## 2018-07-16 NOTE — Assessment & Plan Note (Signed)
On B12 

## 2018-07-16 NOTE — Assessment & Plan Note (Signed)
Better  

## 2018-07-16 NOTE — Progress Notes (Signed)
Subjective:  Patient ID: [name], female    DOB: 28-May-1946  Age: 72 y.o. MRN: [id number]  CC: No chief complaint on file.   HPI [name] presents for HTN, dizziness, diarrhea Started Amlodipine Feeling better C/o hair thinning    Outpatient Medications Prior to Visit  Medication Sig Dispense Refill  . amLODipine (NORVASC) 5 MG tablet TAKE 1 TABLET BY MOUTH EVERYDAY AT BEDTIME  2  . aspirin 81 MG EC tablet Take 81 mg by mouth daily.      . bimatoprost (LUMIGAN) 0.01 % SOLN Place 1 drop into both eyes at bedtime.    . Cholecalciferol (VITAMIN D3) 50000 units CAPS Take 1 capsule by mouth every 30 (thirty) days. 3 capsule 3  . clidinium-chlordiazePOXIDE (LIBRAX) 5-2.5 MG capsule Take 1 capsule by mouth 3 (three) times daily as needed. 60 capsule 3  . denosumab (PROLIA) 60 MG/ML SOLN injection Inject 60 mg into the skin every 6 (six) months. Administer in upper arm, thigh, or abdomen    . diphenoxylate-atropine (LOMOTIL) 2.5-0.025 MG tablet TAKE 1 TABLET BY MOUTH 4 TIMES A DAY AS NEEDED FOR DIARRHEA OR LOOSE STOOLS 60 tablet 3  . ipratropium (ATROVENT) 0.06 % nasal spray Place 2 sprays into the nose 3 (three) times daily. 15 mL 2  . NASCOBAL 500 MCG/0.1ML SOLN USE 1 SPRAY IN 1 NOSTRIL  ONCE A WEEK 3 Bottle 0  . propranolol ER (INDERAL LA) 60 MG 24 hr capsule Take 1 capsule (60 mg total) by mouth 2 (two) times daily. 60 capsule 11  . RABEprazole (ACIPHEX) 20 MG tablet TAKE 1 TABLET BY MOUTH  DAILY 90 tablet 0  . timolol (TIMOPTIC-XR) 0.5 % ophthalmic gel-forming Place 1 drop into both eyes daily.     [name] triamcinolone cream (KENALOG) 0.1 % Apply 1 application topically 2 (two) times daily as needed (ear bacteria).      No facility-administered medications prior to visit.     ROS: Review of Systems  Constitutional: Positive for fatigue. Negative for activity change, appetite change, chills and unexpected weight change.  HENT: Negative for congestion, mouth sores and  sinus pressure.   Eyes: Negative for visual disturbance.  Respiratory: Negative for cough and chest tightness.   Gastrointestinal: Positive for diarrhea. Negative for abdominal pain and nausea.  Genitourinary: Negative for difficulty urinating, frequency and vaginal pain.  Musculoskeletal: Negative for back pain and gait problem.  Skin: Negative for pallor and rash.  Neurological: Positive for dizziness and weakness. Negative for tremors, numbness and headaches.  Psychiatric/Behavioral: Negative for confusion and sleep disturbance.    Objective:  BP 130/72 (BP Location: Left Arm, Patient Position: Sitting, Cuff Size: Large)   Pulse 67   Temp 98 F (36.7 C) (Oral)   Ht 5\' 4"  (1.626 m)   Wt 194 lb (88 kg)   SpO2 98%   BMI 33.30 kg/m   BP Readings from Last 3 Encounters:  07/16/18 130/72  04/18/18 136/72  04/12/18 (!) 141/80    Wt Readings from Last 3 Encounters:  07/16/18 194 lb (88 kg)  04/27/18 192 lb (87.1 kg)  04/18/18 191 lb (86.6 kg)    Physical Exam  Constitutional: She appears well-developed. No distress.  HENT:  Head: Normocephalic.  Right Ear: External ear normal.  Left Ear: External ear normal.  Nose: Nose normal.  Mouth/Throat: Oropharynx is clear and moist.  Eyes: Pupils are equal, round, and reactive to light. Conjunctivae are normal. Right eye exhibits no discharge. Left eye  exhibits no discharge.  Neck: Normal range of motion. Neck supple. No JVD present. No tracheal deviation present. No thyromegaly present.  Cardiovascular: Normal rate, regular rhythm and normal heart sounds.  Pulmonary/Chest: No stridor. No respiratory distress. She has no wheezes.  Abdominal: Soft. Bowel sounds are normal. She exhibits no distension and no mass. There is no tenderness. There is no rebound and no guarding.  Musculoskeletal: She exhibits no edema or tenderness.  Lymphadenopathy:    She has no cervical adenopathy.  Neurological: She displays normal reflexes. No cranial  nerve deficit. She exhibits normal muscle tone. Coordination normal.  Skin: No rash noted. No erythema.  Psychiatric: She has a normal mood and affect. Her behavior is normal. Judgment and thought content normal.  obese   Lab Results  Component Value Date   WBC 6.3 04/02/2018   HGB 11.2 (L) 04/02/2018   HCT 33.0 (L) 04/02/2018   PLT 193 04/02/2018   GLUCOSE 124 (H) 04/02/2018   CHOL 168 03/14/2018   TRIG 145.0 03/14/2018   HDL 46.70 03/14/2018   LDLCALC 93 03/14/2018   ALT 18 10/25/2017   AST 17 10/25/2017   NA 145 04/02/2018   K 3.6 04/02/2018   CL 106 04/02/2018   CREATININE 0.80 04/02/2018   BUN 13 04/02/2018   CO2 28 11/20/2017   TSH 1.530 11/28/2016   HGBA1C 6.4 03/14/2018    Mr Brain Wo Contrast  Result Date: 04/19/2018 [facility] NEUROIMAG[facility] REPORT STUDY DATE: 04/18/18 PATIENT NAME: [name] DOB: 01/07/1946 MRN: [id number] ORDERING CLINICIAN: Kathrynn Ducking, MD CLINICAL HISTORY: 72 year old female with dizziness. EXAM: MRI brain (without) TECHNIQUE: MRI of the brain without contrast was obtained utilizing 5 mm axial slices with T1, T2, T2 flair, SWI and diffusion weighted views.  T1 sagittal and T2 coronal views were obtained. CONTRAST: no COMPARISON: 11/23/16 IMAGING SITE: [facility] [address] (3 Tesla MRI)  FINDINGS: No abnormal lesions are seen on diffusion-weighted views to suggest acute ischemia. The cortical sulci, fissures and cisterns are normal in size and appearance. Lateral, third and fourth ventricle are normal in size and appearance. No extra-axial fluid collections are seen. No evidence of mass effect or midline shift. Mild scattered periventricular and subcortical foci of non-specific gliosis. On sagittal views the posterior fossa, pituitary gland and corpus callosum are unremarkable. No evidence of intracranial hemorrhage on SWI views. The orbits and their contents, paranasal sinuses and calvarium are unremarkable.   Intracranial flow voids are present.   MRI brain (without) demonstrating: - Mild scattered periventricular and subcortical foci of non-specific gliosis. - No acute findings. INTERPRETING PHYSICIAN: Penni Bombard, MD Certified in Neurology, Neurophysiology and Neuroimaging [facility] [address] [place], [place] [address] [phone]    Assessment & Plan:   There are no diagnoses linked to this encounter.   No orders of the defined types were placed in this encounter.    Follow-up: No follow-ups on file.  Walker Kehr, MD

## 2018-07-16 NOTE — Assessment & Plan Note (Signed)
Discussed.

## 2018-07-16 NOTE — Assessment & Plan Note (Addendum)
?  beta blocker or ca chan blocker related vs other Procar (1/4 tablet), Rogain for women for hair loss - discussed TSH

## 2018-07-16 NOTE — Addendum Note (Signed)
Addended by: Karren Cobble on: 07/16/2018 04:46 PM   Modules accepted: Orders

## 2018-07-16 NOTE — Patient Instructions (Signed)
Procar (1/4 tablet), Rogain for women for hair loss

## 2018-07-16 NOTE — Assessment & Plan Note (Signed)
Nephrology - Norvasc at [facility]

## 2018-07-16 NOTE — Assessment & Plan Note (Signed)
On Vit D 

## 2018-07-19 ENCOUNTER — Other Ambulatory Visit: Payer: Self-pay | Admitting: Internal Medicine

## 2018-07-23 ENCOUNTER — Other Ambulatory Visit (INDEPENDENT_AMBULATORY_CARE_PROVIDER_SITE_OTHER): Payer: Medicare Other

## 2018-07-23 DIAGNOSIS — E1169 Type 2 diabetes mellitus with other specified complication: Secondary | ICD-10-CM | POA: Diagnosis not present

## 2018-07-23 DIAGNOSIS — E669 Obesity, unspecified: Secondary | ICD-10-CM | POA: Diagnosis not present

## 2018-07-23 DIAGNOSIS — L659 Nonscarring hair loss, unspecified: Secondary | ICD-10-CM | POA: Diagnosis not present

## 2018-07-23 LAB — TSH: TSH: 1.5 u[IU]/mL (ref 0.35–4.50)

## 2018-07-23 LAB — GLUCOSE, RANDOM: GLUCOSE: 122 mg/dL — AB (ref 70–99)

## 2018-07-23 LAB — HEMOGLOBIN A1C: HEMOGLOBIN A1C: 6.2 % (ref 4.6–6.5)

## 2018-07-26 ENCOUNTER — Ambulatory Visit (INDEPENDENT_AMBULATORY_CARE_PROVIDER_SITE_OTHER): Payer: Medicare Other | Admitting: Endocrinology

## 2018-07-26 ENCOUNTER — Encounter: Payer: Self-pay | Admitting: Endocrinology

## 2018-07-26 VITALS — BP 136/70 | HR 64 | Ht 64.0 in | Wt 193.0 lb

## 2018-07-26 DIAGNOSIS — R7303 Prediabetes: Secondary | ICD-10-CM | POA: Diagnosis not present

## 2018-07-26 NOTE — Patient Instructions (Signed)
Start ex. Bike daily or water exercise

## 2018-07-26 NOTE — Progress Notes (Signed)
Patient ID: [name], female   DOB: November 16, 1945, 72 y.o.   MRN: [id number]           Reason for Appointment: Follow up of prediabetes  Referring physician: [name]  History of Present Illness:          She has prediabetes and also was told to have background retinopathy  On her periodic general checkups she has had an A1c done regularly and these have been upper normal around 6.0  She was evaluated with a glucose tolerance test in 10/2015 which showed the following: Fasting glucose 122, one-hour glucose 202 hour glucose 147  She was referred to the dietitian for meal planning in 3/17  Recent history:  Her A1c in July was higher at 6.4, and now 6.2  Fasting glucose is now 122, previously 126   Because of her blood sugars getting higher she was told to see the dietitian which she did in August  However she is still has difficulty complying with improving her diet  Has not lost any weight  She has occasionally use an exercise bike but has difficulty doing this also and has not tried to do much walking also despite her not being as lightheaded now  She has been reluctant to consider medication previously  She was asked to join the diabetes prevention program at the [facility] previously and she has not wanted to do so   Weight history:  Wt Readings from Last 3 Encounters:  07/26/18 193 lb (87.5 kg)  07/16/18 194 lb (88 kg)  04/27/18 192 lb (87.1 kg)    Glycemic levels:   Lab Results  Component Value Date   HGBA1C 6.2 07/23/2018   HGBA1C 6.4 03/14/2018   HGBA1C 6.0 08/17/2017   Lab Results  Component Value Date   LDLCALC 93 03/14/2018   CREATININE 0.80 04/02/2018       Allergies as of 07/26/2018      Reactions   Actonel [risedronate Sodium]    Upset stomach   Boniva [ibandronate Sodium]    cramp   Calcium Channel Blockers    Upset stomach   Compazine    Daughter reacts to compazine/pt does not want to take   Lyrica [pregabalin]    Dizzy   Tape      redness      Medication List        Accurate as of 07/26/18 10:40 AM. Always use your most recent med list.          amLODipine 5 MG tablet Commonly known as:  NORVASC TAKE 1 TABLET BY MOUTH EVERYDAY AT BEDTIME   aspirin 81 MG EC tablet Take 81 mg by mouth daily.   clidinium-chlordiazePOXIDE 5-2.5 MG capsule Commonly known as:  LIBRAX Take 1 capsule by mouth 3 (three) times daily as needed.   denosumab 60 MG/ML Soln injection Commonly known as:  PROLIA Inject 60 mg into the skin every 6 (six) months. Administer in upper arm, thigh, or abdomen   diphenoxylate-atropine 2.5-0.025 MG tablet Commonly known as:  LOMOTIL TAKE 1 TABLET BY MOUTH 4 TIMES A DAY AS NEEDED FOR DIARRHEA OR LOOSE STOOLS   ipratropium 0.06 % nasal spray Commonly known as:  ATROVENT Place 2 sprays into the nose 3 (three) times daily.   LUMIGAN 0.01 % Soln Generic drug:  bimatoprost Place 1 drop into both eyes at bedtime.   NASCOBAL 500 MCG/0.1ML Soln Generic drug:  Cyanocobalamin USE 1 SPRAY IN 1 NOSTRIL  ONCE A WEEK  propranolol ER 60 MG 24 hr capsule Commonly known as:  INDERAL LA Take 1 capsule (60 mg total) by mouth 2 (two) times daily.   RABEprazole 20 MG tablet Commonly known as:  ACIPHEX TAKE 1 TABLET BY MOUTH  DAILY   timolol 0.5 % ophthalmic gel-forming Commonly known as:  TIMOPTIC-XR Place 1 drop into both eyes daily.   triamcinolone cream 0.1 % Commonly known as:  KENALOG Apply 1 application topically 2 (two) times daily as needed (ear bacteria).   Vitamin D3 1.25 MG (50000 UT) Caps Take 1 capsule by mouth every 30 (thirty) days.       Allergies:  Allergies  Allergen Reactions  . Actonel [Risedronate Sodium]     Upset stomach  . Boniva [Ibandronate Sodium]     cramp  . Calcium Channel Blockers     Upset stomach  . Compazine     Daughter reacts to compazine/pt does not want to take  . Lyrica [Pregabalin]     Dizzy   . Tape     redness    Past Medical  History:  Diagnosis Date  . Adenomatous colon polyp 04/08/2011  . Anemia   . Anxiety   . Cataract   . Essential hypertension 08/10/2007   Chronic  On Catapress (per [name]) - increase to bid - d/c Hydralazine prn per [name] d/c 3/19: Increase Inderal LA to bid  . GERD (gastroesophageal reflux disease)   . Glaucoma (increased eye pressure)   . Heart murmur   . HTN (hypertension)   . IBS (irritable bowel syndrome)   . LBP (low back pain)   . Menopause   . Osteoporosis   . Ovarian cancer ([facility]) 09/2008   Brandi [name]  . Pancreatic cyst   . Vitamin B12 deficiency   . Vitamin D deficiency     Past Surgical History:  Procedure Laterality Date  . ABDOMINAL HYSTERECTOMY    . APPENDECTOMY    . CHOLECYSTECTOMY N/A 05/15/2017   Procedure: LAPAROSCOPIC CHOLECYSTECTOMY WITH INTRAOPERATIVE CHOLANGIOGRAM AND LYSIS OF ADHESIONS;  Surgeon: Brandi Kelp, MD;  Location: [facility];  Service: General;  Laterality: N/A;  . ELBOW FRACTURE SURGERY     age 87- left elbow  . HEMORRHOID SURGERY  2001  . Ovarian Cancer Debulking  09/2008    Family History  Problem Relation Age of Onset  . Alzheimer's disease Mother 11  . Other Mother 60       TAH for excessive bleeding  . Lung cancer Father        smoker; metastasis to stomach and other areas  . Heart attack Maternal Uncle   . Other Paternal Aunt        stomach issues  . Heart Problems Paternal Uncle   . Other Maternal Grandmother        stomach issues; +hysterectomy  . Heart attack Maternal Grandfather   . Infertility Daughter   . Stomach cancer Cousin        dx. mid-60s  . Other Cousin        stomach issues  . Leukemia Cousin 18  . Stomach cancer Other   . Cancer Cousin        unknown type  . Other Cousin        stomach issues  . Heart Problems Maternal Uncle   . Diabetes Paternal Aunt   . Heart Problems Paternal Uncle   . Emphysema Paternal Uncle        work exposure  . Breast  cancer Cousin        dx. late 60s-early 86s  .  Colon cancer Neg Hx   . Rectal cancer Neg Hx   . Esophageal cancer Neg Hx     Social History:  reports that she has never smoked. She has never used smokeless tobacco. She reports that she does not drink alcohol or use drugs.    Review of Systems     Lipid history: She has not been on any statin drugs, lipids at a good level    Lab Results  Component Value Date   CHOL 168 03/14/2018   HDL 46.70 03/14/2018   LDLCALC 93 03/14/2018   TRIG 145.0 03/14/2018   CHOLHDL 4 03/14/2018         RETINOPATHY: She is followed by ophthalmologist for background retinopathy  Thyroid level was checked at the request of her PCP  Review of Systems   LABS:  Lab on 07/23/2018  Component Date Value Ref Range Status  . Glucose, Bld 07/23/2018 122* 70 - 99 mg/dL Final  . Hgb R6E MFr Bld 07/23/2018 6.2  4.6 - 6.5 % Final   Glycemic Control Guidelines for People with Diabetes:Non Diabetic:  <6%Goal of Therapy: <7%Additional Action Suggested:  >8%   . TSH 07/23/2018 1.50  0.35 - 4.50 uIU/mL Final    Physical Examination:  BP 136/70   Pulse 64   Ht 5\' 4"  (1.626 m)   Wt 193 lb (87.5 kg)   SpO2 96%   BMI 33.13 kg/m         ASSESSMENT/PLAN:  PREDIABETES with obesity:  Her A1c has been in the prediabetic range although fasting glucose was 126 on the last visit indicating very early diabetes  She has difficulty losing weight with inadequate compliance with diet and exercise However is doing a little better with the diet and her A1c is better at 6.2 Also fasting glucose was 122  Again discussed lifestyle changes She will try to start walking Also encouraged her to try water aerobics as exercise She will try to improve her compliance with diet and her husband was present in the office today also  Follow-up in 6 months   An Schnabel 07/26/2018, 10:40 AM   Note: This office note was prepared with Dragon voice recognition system technology. Any transcriptional errors that result from  this process are unintentional.

## 2018-08-13 ENCOUNTER — Ambulatory Visit (INDEPENDENT_AMBULATORY_CARE_PROVIDER_SITE_OTHER): Payer: Medicare Other | Admitting: Neurology

## 2018-08-13 ENCOUNTER — Encounter: Payer: Self-pay | Admitting: Neurology

## 2018-08-13 VITALS — BP 146/66 | HR 58 | Ht 64.0 in | Wt 195.0 lb

## 2018-08-13 DIAGNOSIS — R42 Dizziness and giddiness: Secondary | ICD-10-CM | POA: Diagnosis not present

## 2018-08-13 NOTE — Progress Notes (Signed)
Reason for visit: Dizziness  [name] is an 72 y.o. female  History of present illness:  [name] is a 72 year old right-handed white female with a history of hypertension and dizziness.  The patient has had spikes of blood pressure in the past, she has been placed on amlodipine at night which she believes has helped her symptoms.  She has some dizziness if she bends over or stoops and then comes up too quickly, she denies any severe episodes of dizziness and diaphoresis.  She checks her blood pressures regularly at home.  MRI of the brain did not show evidence of severe small vessel ischemic changes, no ischemia to the brainstem was noted.  Carotid Doppler study was unremarkable.  She returns for an evaluation.  She has not had any falls or stumbles.  Past Medical History:  Diagnosis Date  . Adenomatous colon polyp 04/08/2011  . Anemia   . Anxiety   . Cataract   . Essential hypertension 08/10/2007   Chronic  On Catapress (per Dr Ubaldo Glassing) - increase to bid - d/c Hydralazine prn per Dr Caryl Comes d/c 3/19: Increase Inderal LA to bid  . GERD (gastroesophageal reflux disease)   . Glaucoma (increased eye pressure)   . Heart murmur   . HTN (hypertension)   . IBS (irritable bowel syndrome)   . LBP (low back pain)   . Menopause   . Osteoporosis   . Ovarian cancer ([place]) 09/2008   Dr Gwyneth Revels  . Pancreatic cyst   . Vitamin B12 deficiency   . Vitamin D deficiency     Past Surgical History:  Procedure Laterality Date  . ABDOMINAL HYSTERECTOMY    . APPENDECTOMY    . CHOLECYSTECTOMY N/A 05/15/2017   Procedure: LAPAROSCOPIC CHOLECYSTECTOMY WITH INTRAOPERATIVE CHOLANGIOGRAM AND LYSIS OF ADHESIONS;  Surgeon: Fanny Skates, MD;  Location: [facility];  Service: General;  Laterality: N/A;  . ELBOW FRACTURE SURGERY     age 54- left elbow  . [place]  2001  . Ovarian Cancer Debulking  09/2008    Family History  Problem Relation Age of Onset  . Alzheimer's disease Mother 62  .  Other Mother 78       TAH for excessive bleeding  . Lung cancer Father        smoker; metastasis to stomach and other areas  . Heart attack Maternal Uncle   . Other Paternal Aunt        stomach issues  . Heart Problems Paternal Uncle   . Other Maternal Grandmother        stomach issues; +hysterectomy  . Heart attack Maternal Grandfather   . Infertility Daughter   . Stomach cancer Cousin        dx. mid-60s  . Other Cousin        stomach issues  . Leukemia Cousin 18  . Stomach cancer Other   . Cancer Cousin        unknown type  . Other Cousin        stomach issues  . Heart Problems Maternal Uncle   . Diabetes Paternal Aunt   . Heart Problems Paternal Uncle   . Emphysema Paternal Uncle        work exposure  . Breast cancer Cousin        dx. late 60s-early 70s  . Colon cancer Neg Hx   . Rectal cancer Neg Hx   . Esophageal cancer Neg Hx     Social history:  reports that  she has never smoked. She has never used smokeless tobacco. She reports that she does not drink alcohol or use drugs.    Allergies  Allergen Reactions  . Actonel [Risedronate Sodium]     Upset stomach  . Boniva [Ibandronate Sodium]     cramp  . Calcium Channel Blockers     Upset stomach  . Compazine     Daughter reacts to compazine/pt does not want to take  . Lyrica [Pregabalin]     Dizzy   . Tape     redness    Medications:  Prior to Admission medications   Medication Sig Start Date End Date Taking? Authorizing Provider  amLODipine (NORVASC) 5 MG tablet TAKE 1 TABLET BY MOUTH EVERYDAY AT BEDTIME 07/03/18  Yes [provider]  aspirin 81 MG EC tablet Take 81 mg by mouth daily.     Yes [provider]  bimatoprost (LUMIGAN) 0.01 % SOLN Place 1 drop into both eyes at bedtime.   Yes [provider]  Cholecalciferol (VITAMIN D3) 50000 units CAPS Take 1 capsule by mouth every 30 (thirty) days. 11/20/17  Yes [name], Evie Lacks, MD  denosumab (PROLIA) 60 MG/ML SOLN injection  Inject 60 mg into the skin every 6 (six) months. Administer in upper arm, thigh, or abdomen 09/02/13  Yes [provider]  ipratropium (ATROVENT) 0.06 % nasal spray Place 2 sprays into the nose 3 (three) times daily. 01/17/18 01/17/19 Yes [name], Evie Lacks, MD  NASCOBAL 500 MCG/0.1ML SOLN USE 1 SPRAY IN 1 NOSTRIL  ONCE A WEEK 07/19/18  Yes [name], Evie Lacks, MD  propranolol ER (INDERAL LA) 60 MG 24 hr capsule Take 1 capsule (60 mg total) by mouth 2 (two) times daily. 11/20/17  Yes [name], Evie Lacks, MD  RABEprazole (ACIPHEX) 20 MG tablet TAKE 1 TABLET BY MOUTH  DAILY 06/07/18  Yes Irene Shipper, MD  timolol (TIMOPTIC-XR) 0.5 % ophthalmic gel-forming Place 1 drop into both eyes daily.  03/03/11  Yes [provider]  triamcinolone cream (KENALOG) 0.1 % Apply 1 application topically 2 (two) times daily as needed (ear bacteria).    Yes [provider]  clidinium-chlordiazePOXIDE (LIBRAX) 5-2.5 MG capsule Take 1 capsule by mouth 3 (three) times daily as needed. Patient not taking: Reported on 08/13/2018 11/20/17   [name], Evie Lacks, MD  diphenoxylate-atropine (LOMOTIL) 2.5-0.025 MG tablet TAKE 1 TABLET BY MOUTH 4 TIMES A DAY AS NEEDED FOR DIARRHEA OR LOOSE STOOLS Patient not taking: Reported on 08/13/2018 01/25/18   [name], Evie Lacks, MD    ROS:  Out of a complete 14 system review of symptoms, the patient complains only of the following symptoms, and all other reviewed systems are negative.  Shortness of breath Abdominal pain, diarrhea, rectal pain Incontinence of the bladder, urinary urgency  Blood pressure (!) 146/66, pulse (!) 58, height 5\' 4"  (1.626 m), weight 195 lb (88.5 kg).   Recheck blood pressure right arm sitting is 192/80.  Physical Exam  General: The patient is alert and cooperative at the time of the examination.  The patient is moderately obese.  Skin: No significant peripheral edema is noted.   Neurologic Exam  Mental status: The patient  is alert and oriented x 3 at the time of the examination. The patient has apparent normal recent and remote memory, with an apparently normal attention span and concentration ability.   Cranial nerves: Facial symmetry is present. Speech is normal, no aphasia or dysarthria is noted. Extraocular movements are full. Visual fields  are full.  Motor: The patient has good strength in all 4 extremities.  Sensory examination: Soft touch sensation is symmetric on the face, arms, and legs.  Coordination: The patient has good finger-nose-finger and heel-to-shin bilaterally.  Gait and station: The patient has a normal gait. Tandem gait is normal. Romberg is negative. No drift is seen.  Reflexes: Deep tendon reflexes are symmetric.   Carotid doppler 04/18/18:  Final Interpretation: Right Carotid: There is no evidence of stenosis in the right ICA.  Left Carotid: Velocities in the left ICA are consistent with a 1-39% stenosis.  Vertebrals: Bilateral vertebral arteries demonstrate antegrade flow. Subclavians: Normal flow hemodynamics were seen in bilateral subclavian       arteries.    MRI brain 04/19/18:  IMPRESSION:   MRI brain (without) demonstrating: - Mild scattered periventricular and subcortical foci of non-specific gliosis.  - No acute findings.  * MRI scan images were reviewed online. I agree with the written report.    Assessment/Plan:  1.  Hypertension  2.  Dizziness  The patient continues to have an elevated blood pressure on recheck, she is getting better blood pressures at home, however.  She has been placed on amlodipine at nighttime which seems to help.  The patient has had improvement in her dizziness, she still has some low-grade dizziness with rapid head movements.  She will follow-up in 6 months.   Jill Alexanders MD 08/13/2018 12:01 PM  [facility] [address] [place], [place] [address]  Phone [phone] Fax  [phone]

## 2018-09-03 ENCOUNTER — Other Ambulatory Visit: Payer: Self-pay | Admitting: Internal Medicine

## 2018-09-03 NOTE — Telephone Encounter (Signed)
Copied from [place] [id number]. Topic: Quick Communication - Rx Refill/Question >> Sep 03, 2018 10:07 AM Ahmed Prima L wrote: Medication: RABEprazole (ACIPHEX) 20 MG tablet, Requesting 90 day supply -  Has the patient contacted their pharmacy? Yes, they are out of stock at [facility] and needs to go to local pharmacy (Agent: If no, request that the patient contact the pharmacy for the refill.) (Agent: If yes, when and what did the pharmacy advise?)  Preferred Pharmacy (with phone number or street name): [facility] @ [address]   Agent: Please be advised that RX refills may take up to 3 business days. We ask that you follow-up with your pharmacy.

## 2018-09-03 NOTE — Telephone Encounter (Signed)
Requested medication (s) are due for refill today: yes  Requested medication (s) are on the active medication list: yes  Last refill:  06/07/18 for 90 tabs  Future visit scheduled: yes  Notes to clinic:  Prescribed by Dr. Scarlette Shorts  Requested Prescriptions  Pending Prescriptions Disp Refills   RABEprazole (ACIPHEX) 20 MG tablet 90 tablet 0    Sig: Take 1 tablet (20 mg total) by mouth daily.     Gastroenterology: Proton Pump Inhibitors Passed - 09/03/2018  4:08 PM      Passed - Valid encounter within last 12 months    Recent Outpatient Visits          1 month ago Need for influenza vaccination   [place] [name], Evie Lacks, MD   4 months ago Physical deconditioning   [place], MD   5 months ago Abnormal chest sounds   [place], MD   7 months ago Need for pneumococcal vaccination   [place] [name], Evie Lacks, MD   9 months ago Irritable bowel syndrome with diarrhea   [place], MD      Future Appointments            In 1 month [name], Evie Lacks, MD [place], [place]

## 2018-09-04 MED ORDER — RABEPRAZOLE SODIUM 20 MG PO TBEC: 20.0000 mg | DELAYED_RELEASE_TABLET | Freq: Every day | ORAL | 1 refills | Status: DC

## 2018-09-12 ENCOUNTER — Telehealth: Payer: Self-pay

## 2018-09-12 NOTE — Telephone Encounter (Signed)
Incoming call from pt regarding appt for Feb 2020 follow up- appt made for Feb 11 at 1:30 pm - pt voiced understanding. No other needs per her at this time.

## 2018-09-17 ENCOUNTER — Other Ambulatory Visit: Payer: Self-pay | Admitting: Internal Medicine

## 2018-09-17 DIAGNOSIS — R0989 Other specified symptoms and signs involving the circulatory and respiratory systems: Secondary | ICD-10-CM | POA: Diagnosis not present

## 2018-10-10 DIAGNOSIS — H40013 Open angle with borderline findings, low risk, bilateral: Secondary | ICD-10-CM | POA: Diagnosis not present

## 2018-10-10 DIAGNOSIS — H52203 Unspecified astigmatism, bilateral: Secondary | ICD-10-CM | POA: Diagnosis not present

## 2018-10-10 DIAGNOSIS — E119 Type 2 diabetes mellitus without complications: Secondary | ICD-10-CM | POA: Diagnosis not present

## 2018-10-10 DIAGNOSIS — H2513 Age-related nuclear cataract, bilateral: Secondary | ICD-10-CM | POA: Diagnosis not present

## 2018-10-10 LAB — HM DIABETES EYE EXAM

## 2018-10-16 ENCOUNTER — Encounter: Payer: Self-pay | Admitting: Gynecology

## 2018-10-16 ENCOUNTER — Inpatient Hospital Stay: Payer: Medicare Other | Attending: Gynecology | Admitting: Gynecology

## 2018-10-16 VITALS — BP 140/59 | HR 68 | Temp 97.8°F | Resp 18 | Ht 64.0 in | Wt 194.0 lb

## 2018-10-16 DIAGNOSIS — R32 Unspecified urinary incontinence: Secondary | ICD-10-CM | POA: Insufficient documentation

## 2018-10-16 DIAGNOSIS — Z90710 Acquired absence of both cervix and uterus: Secondary | ICD-10-CM | POA: Diagnosis not present

## 2018-10-16 DIAGNOSIS — Z8544 Personal history of malignant neoplasm of other female genital organs: Secondary | ICD-10-CM

## 2018-10-16 DIAGNOSIS — K529 Noninfective gastroenteritis and colitis, unspecified: Secondary | ICD-10-CM | POA: Diagnosis not present

## 2018-10-16 DIAGNOSIS — Z08 Encounter for follow-up examination after completed treatment for malignant neoplasm: Secondary | ICD-10-CM | POA: Diagnosis not present

## 2018-10-16 DIAGNOSIS — Z9221 Personal history of antineoplastic chemotherapy: Secondary | ICD-10-CM | POA: Diagnosis not present

## 2018-10-16 DIAGNOSIS — C5700 Malignant neoplasm of unspecified fallopian tube: Secondary | ICD-10-CM

## 2018-10-16 NOTE — Patient Instructions (Signed)
Plan to follow up in one year or sooner if needed.  Please call closer to the date to schedule your appointment at [phone].

## 2018-10-16 NOTE — Progress Notes (Signed)
Consult Note: Gyn-Onc   HETVI SHAWHAN 73 y.o. female  No chief complaint on file.  Assessment: Stage III fallopian tube cancer 2010. Clinically free of disease. Chronic diarrhea. Urinary incontinence  Plan  the patient return to see [place] in one year.  We will no longer monitor Ca1 25 values.  The patient would like to be evaluated for her urinary incontinence and I would refer her again to Dr. Maryland Pink.. .  Interval History Since her last visit the patient has done well.  She continues to have chronic diarrhea but has no other GI symptoms.  Her functional status is good although she is somewhat anxious because she is recently been diagnosed with hypertension.  In addition, she has urinary incontinence and she wears a perineal pad continually.  She also has a sensation that her pelvis is "falling out".  At last years visit I recommended the patient be seen by Dr. Maryland Pink but she is failed to do that to date.  HPI: Stage III fallopian tube carcinoma undergoing initial surgical debulking February 2010. She was optimally debulked with minimal disease on peritoneal surfaces. She received 6 cycles of carboplatin and Taxol chemotherapy completed in June 2010. He CT scan and a PET scan at that time showed no evidence disease. The patient has been followed since that time with normal exams and CA 125 values.    Review of Systems:10 point review of systems is negative as noted above.   Vitals: Blood pressure (!) 140/59, pulse 68, temperature 97.8 F (36.6 C), temperature source Axillary, resp. rate 18, height 5\' 4"  (1.626 m), weight 194 lb (88 kg), SpO2 98 %.  Physical Exam: General : The patient is a healthy woman in no acute distress.  HEENT: normocephalic, extraoccular movements normal; neck is supple without thyromegally  Lynphnodes: Supraclavicular, axillary and inguinal nodes not enlarged   Breasts are without masses skin changes or nipple discharge. Abdomen: Soft,  non-tender, no ascites, no organomegally, no masses, no hernias  Pelvic:  EGBUS: Normal female  Vagina: Patient has a grade 1-2 cystocele and rectocele. Urethra and Bladder: Normal, non-tender, cystocele.  Cervix: Surgically absent  Uterus: Surgically absent  Bi-manual examination: Non-tender; no adenxal masses or nodularity  Rectal: normal sphincter tone, no masses, no blood  Lower extremities: No edema or varicosities. Normal range of motion     Allergies  Allergen Reactions  . Actonel [Risedronate Sodium]     Upset stomach  . Boniva [Ibandronate Sodium]     cramp  . Calcium Channel Blockers     Upset stomach  . Compazine     Daughter reacts to compazine/pt does not want to take  . Lyrica [Pregabalin]     Dizzy   . Tape     redness    Past Medical History:  Diagnosis Date  . Adenomatous colon polyp 04/08/2011  . Anemia   . Anxiety   . Cataract   . Essential hypertension 08/10/2007   Chronic  On Catapress (per Dr Ubaldo Glassing) - increase to bid - d/c Hydralazine prn per Dr Caryl Comes d/c 3/19: Increase Inderal LA to bid  . GERD (gastroesophageal reflux disease)   . Glaucoma (increased eye pressure)   . Heart murmur   . HTN (hypertension)   . IBS (irritable bowel syndrome)   . LBP (low back pain)   . Menopause   . Osteoporosis   . Ovarian cancer ([place]) 09/2008   Dr Gwyneth Revels  . Pancreatic cyst   . Vitamin B12 deficiency   .  Vitamin D deficiency     Past Surgical History:  Procedure Laterality Date  . ABDOMINAL HYSTERECTOMY    . APPENDECTOMY    . CHOLECYSTECTOMY N/A 05/15/2017   Procedure: LAPAROSCOPIC CHOLECYSTECTOMY WITH INTRAOPERATIVE CHOLANGIOGRAM AND LYSIS OF ADHESIONS;  Surgeon: Fanny Skates, MD;  Location: [facility];  Service: General;  Laterality: N/A;  . ELBOW FRACTURE SURGERY     age 9- left elbow  . [place]  2001  . Ovarian Cancer Debulking  09/2008    Current Outpatient Medications  Medication Sig Dispense Refill  . amLODipine (NORVASC) 5 MG  tablet TAKE 1 TABLET BY MOUTH EVERYDAY AT BEDTIME  2  . aspirin 81 MG EC tablet Take 81 mg by mouth daily.      . bimatoprost (LUMIGAN) 0.01 % SOLN Place 1 drop into both eyes at bedtime.    . Cholecalciferol (VITAMIN D3) 50000 units CAPS Take 1 capsule by mouth every 30 (thirty) days. 3 capsule 3  . denosumab (PROLIA) 60 MG/ML SOLN injection Inject 60 mg into the skin every 6 (six) months. Administer in upper arm, thigh, or abdomen    . diphenoxylate-atropine (LOMOTIL) 2.5-0.025 MG tablet TAKE 1 TABLET BY MOUTH 4 TIMES A DAY AS NEEDED FOR DIARRHEA OR LOOSE STOOLS 60 tablet 3  . NASCOBAL 500 MCG/0.1ML SOLN USE 1 SPRAY IN 1 NOSTRIL  ONCE A WEEK 3 Bottle 0  . propranolol ER (INDERAL LA) 60 MG 24 hr capsule TAKE ONE CAPSULE BY MOUTH TWICE A DAY 180 capsule 3  . RABEprazole (ACIPHEX) 20 MG tablet Take 1 tablet (20 mg total) by mouth daily. 90 tablet 1  . timolol (TIMOPTIC-XR) 0.5 % ophthalmic gel-forming Place 1 drop into both eyes daily.     [name] triamcinolone cream (KENALOG) 0.1 % Apply 1 application topically 2 (two) times daily as needed (ear bacteria).      No current facility-administered medications for this visit.     Social History   Socioeconomic History  . Marital status: Married    Spouse name: Dominica Severin  . Number of children: 2  . Years of education: BS  . Highest education level: Not on file  Occupational History  . Occupation: Scientist, 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  Social Needs  . Financial resource strain: Not on file  . Food insecurity:    Worry: Not on file    Inability: Not on file  . Transportation needs:    Medical: Not on file    Non-medical: Not on file  Tobacco Use  . Smoking status: Never Smoker  . Smokeless tobacco: Never Used  Substance and Sexual Activity  . Alcohol use: No    Alcohol/week: 0.0 standard drinks  . Drug use: No  . Sexual activity: Not on file  Lifestyle  . Physical activity:    Days per week: Not on file    Minutes per session: Not on file  .  Stress: Not on file  Relationships  . Social connections:    Talks on phone: Not on file    Gets together: Not on file    Attends religious service: Not on file    Active member of club or organization: Not on file    Attends meetings of clubs or organizations: Not on file    Relationship status: Not on file  . Intimate partner violence:    Fear of current or ex partner: Not on file    Emotionally abused: Not on file    Physically abused: Not on file  Forced sexual activity: Not on file  Other Topics Concern  . Not on file  Social History Narrative   Lives with spouse   Caffeine use: cokes      Right handed     Family History  Problem Relation Age of Onset  . Alzheimer's disease Mother 25  . Other Mother 25       TAH for excessive bleeding  . Lung cancer Father        smoker; metastasis to stomach and other areas  . Heart attack Maternal Uncle   . Other Paternal Aunt        stomach issues  . Heart Problems Paternal Uncle   . Other Maternal Grandmother        stomach issues; +hysterectomy  . Heart attack Maternal Grandfather   . Infertility Daughter   . Stomach cancer Cousin        dx. mid-60s  . Other Cousin        stomach issues  . Leukemia Cousin 18  . Stomach cancer Other   . Cancer Cousin        unknown type  . Other Cousin        stomach issues  . Heart Problems Maternal Uncle   . Diabetes Paternal Aunt   . Heart Problems Paternal Uncle   . Emphysema Paternal Uncle        work exposure  . Breast cancer Cousin        dx. late 60s-early 70s  . Colon cancer Neg Hx   . Rectal cancer Neg Hx   . Esophageal cancer Neg Hx       Marti Sleigh, MD 10/16/2018, 1:38 PM

## 2018-10-18 ENCOUNTER — Ambulatory Visit (INDEPENDENT_AMBULATORY_CARE_PROVIDER_SITE_OTHER): Payer: Medicare Other | Admitting: Internal Medicine

## 2018-10-18 ENCOUNTER — Telehealth: Payer: Self-pay | Admitting: Internal Medicine

## 2018-10-18 ENCOUNTER — Encounter: Payer: Self-pay | Admitting: Internal Medicine

## 2018-10-18 VITALS — BP 132/68 | HR 56 | Temp 97.7°F | Ht 64.0 in | Wt 194.0 lb

## 2018-10-18 DIAGNOSIS — F410 Panic disorder [episodic paroxysmal anxiety] without agoraphobia: Secondary | ICD-10-CM | POA: Diagnosis not present

## 2018-10-18 DIAGNOSIS — E538 Deficiency of other specified B group vitamins: Secondary | ICD-10-CM

## 2018-10-18 DIAGNOSIS — M810 Age-related osteoporosis without current pathological fracture: Secondary | ICD-10-CM | POA: Diagnosis not present

## 2018-10-18 DIAGNOSIS — R42 Dizziness and giddiness: Secondary | ICD-10-CM

## 2018-10-18 DIAGNOSIS — R55 Syncope and collapse: Secondary | ICD-10-CM | POA: Diagnosis not present

## 2018-10-18 DIAGNOSIS — I10 Essential (primary) hypertension: Secondary | ICD-10-CM | POA: Diagnosis not present

## 2018-10-18 DIAGNOSIS — K219 Gastro-esophageal reflux disease without esophagitis: Secondary | ICD-10-CM

## 2018-10-18 MED ORDER — DENOSUMAB 60 MG/ML ~~LOC~~ SOSY
60.0000 mg | PREFILLED_SYRINGE | Freq: Once | SUBCUTANEOUS | Status: AC
  Administered 2018-10-18: 60 mg via SUBCUTANEOUS

## 2018-10-18 MED ORDER — CYANOCOBALAMIN 500 MCG/0.1ML NA SOLN
NASAL | 3 refills | Status: DC
Start: 2018-10-18 — End: 2020-05-13

## 2018-10-18 NOTE — Patient Instructions (Signed)

## 2018-10-18 NOTE — Assessment & Plan Note (Signed)
Better  

## 2018-10-18 NOTE — Assessment & Plan Note (Addendum)
Norvasc at HS, Propranolol A  cardiac CT scan for calcium scoring offered

## 2018-10-18 NOTE — Assessment & Plan Note (Signed)
No relapse 

## 2018-10-18 NOTE — Progress Notes (Signed)
Subjective:  Patient ID: [name], female    DOB: 1946-03-17  Age: 73 y.o. MRN: [id number]  CC: No chief complaint on file.   HPI [name] presents for osteoporosis, B12 def, HTN f/u SBP 130-150  Outpatient Medications Prior to Visit  Medication Sig Dispense Refill  . amLODipine (NORVASC) 5 MG tablet TAKE 1 TABLET BY MOUTH EVERYDAY AT BEDTIME  2  . aspirin 81 MG EC tablet Take 81 mg by mouth daily.      . bimatoprost (LUMIGAN) 0.01 % SOLN Place 1 drop into both eyes at bedtime.    . Cholecalciferol (VITAMIN D3) 50000 units CAPS Take 1 capsule by mouth every 30 (thirty) days. 3 capsule 3  . denosumab (PROLIA) 60 MG/ML SOLN injection Inject 60 mg into the skin every 6 (six) months. Administer in upper arm, thigh, or abdomen    . diphenoxylate-atropine (LOMOTIL) 2.5-0.025 MG tablet TAKE 1 TABLET BY MOUTH 4 TIMES A DAY AS NEEDED FOR DIARRHEA OR LOOSE STOOLS 60 tablet 3  . NASCOBAL 500 MCG/0.1ML SOLN USE 1 SPRAY IN 1 NOSTRIL  ONCE A WEEK 3 Bottle 0  . propranolol ER (INDERAL LA) 60 MG 24 hr capsule TAKE ONE CAPSULE BY MOUTH TWICE A DAY 180 capsule 3  . RABEprazole (ACIPHEX) 20 MG tablet Take 1 tablet (20 mg total) by mouth daily. 90 tablet 1  . timolol (TIMOPTIC-XR) 0.5 % ophthalmic gel-forming Place 1 drop into both eyes daily.     [name] triamcinolone cream (KENALOG) 0.1 % Apply 1 application topically 2 (two) times daily as needed (ear bacteria).      No facility-administered medications prior to visit.     ROS: Review of Systems  Constitutional: Positive for fatigue. Negative for activity change, appetite change, chills and unexpected weight change.  HENT: Negative for congestion, mouth sores and sinus pressure.   Eyes: Negative for visual disturbance.  Respiratory: Negative for cough and chest tightness.   Gastrointestinal: Negative for abdominal pain and nausea.  Genitourinary: Negative for difficulty urinating, frequency and vaginal pain.  Musculoskeletal: Negative  for back pain and gait problem.  Skin: Negative for pallor and rash.  Neurological: Positive for dizziness and light-headedness. Negative for tremors, syncope, weakness, numbness and headaches.  Psychiatric/Behavioral: Negative for confusion and sleep disturbance.    Objective:  BP 132/68 (BP Location: Left Arm, Patient Position: Sitting, Cuff Size: Large)   Pulse (!) 56   Temp 97.7 F (36.5 C) (Oral)   Ht 5\' 4"  (1.626 m)   Wt 194 lb (88 kg)   SpO2 97%   BMI 33.30 kg/m   BP Readings from Last 3 Encounters:  10/18/18 132/68  10/16/18 (!) 140/59  08/13/18 (!) 146/66    Wt Readings from Last 3 Encounters:  10/18/18 194 lb (88 kg)  10/16/18 194 lb (88 kg)  08/13/18 195 lb (88.5 kg)    Physical Exam Constitutional:      General: She is not in acute distress.    Appearance: She is well-developed.  HENT:     Head: Normocephalic.     Right Ear: External ear normal.     Left Ear: External ear normal.     Nose: Nose normal.  Eyes:     General:        Right eye: No discharge.        Left eye: No discharge.     Conjunctiva/sclera: Conjunctivae normal.     Pupils: Pupils are equal, round, and reactive to light.  Neck:     Musculoskeletal: Normal range of motion and neck supple.     Thyroid: No thyromegaly.     Vascular: No JVD.     Trachea: No tracheal deviation.  Cardiovascular:     Rate and Rhythm: Normal rate and regular rhythm.     Heart sounds: Normal heart sounds.  Pulmonary:     Effort: No respiratory distress.     Breath sounds: No stridor. No wheezing.  Abdominal:     General: Bowel sounds are normal. There is no distension.     Palpations: Abdomen is soft. There is no mass.     Tenderness: There is no abdominal tenderness. There is no guarding or rebound.  Musculoskeletal:        General: No tenderness.  Lymphadenopathy:     Cervical: No cervical adenopathy.  Skin:    Findings: No erythema or rash.  Neurological:     Cranial Nerves: No cranial nerve  deficit.     Motor: No abnormal muscle tone.     Coordination: Coordination normal.     Deep Tendon Reflexes: Reflexes normal.  Psychiatric:        Behavior: Behavior normal.        Thought Content: Thought content normal.        Judgment: Judgment normal.   obese   Lab Results  Component Value Date   WBC 6.3 04/02/2018   HGB 11.2 (L) 04/02/2018   HCT 33.0 (L) 04/02/2018   PLT 193 04/02/2018   GLUCOSE 122 (H) 07/23/2018   CHOL 168 03/14/2018   TRIG 145.0 03/14/2018   HDL 46.70 03/14/2018   LDLCALC 93 03/14/2018   ALT 18 10/25/2017   AST 17 10/25/2017   NA 145 04/02/2018   K 3.6 04/02/2018   CL 106 04/02/2018   CREATININE 0.80 04/02/2018   BUN 13 04/02/2018   CO2 28 11/20/2017   TSH 1.50 07/23/2018   HGBA1C 6.2 07/23/2018    Mr Brain Wo Contrast  Result Date: 04/19/2018 [facility] NEUROIMAG[facility] REPORT STUDY DATE: 04/18/18 PATIENT NAME: [name] DOB: 05/16/1946 MRN: [id number] ORDERING CLINICIAN: Kathrynn Ducking, MD CLINICAL HISTORY: 73 year old female with dizziness. EXAM: MRI brain (without) TECHNIQUE: MRI of the brain without contrast was obtained utilizing 5 mm axial slices with T1, T2, T2 flair, SWI and diffusion weighted views.  T1 sagittal and T2 coronal views were obtained. CONTRAST: no COMPARISON: 11/23/16 IMAGING SITE: [facility] [address] (3 Tesla MRI)  FINDINGS: No abnormal lesions are seen on diffusion-weighted views to suggest acute ischemia. The cortical sulci, fissures and cisterns are normal in size and appearance. Lateral, third and fourth ventricle are normal in size and appearance. No extra-axial fluid collections are seen. No evidence of mass effect or midline shift. Mild scattered periventricular and subcortical foci of non-specific gliosis. On sagittal views the posterior fossa, pituitary gland and corpus callosum are unremarkable. No evidence of intracranial hemorrhage on SWI views. The orbits and their contents,  paranasal sinuses and calvarium are unremarkable.  Intracranial flow voids are present.   MRI brain (without) demonstrating: - Mild scattered periventricular and subcortical foci of non-specific gliosis. - No acute findings. INTERPRETING PHYSICIAN: Penni Bombard, MD Certified in Neurology, Neurophysiology and Neuroimaging [facility] [address] [place], [place] [address] [phone]    Assessment & Plan:   There are no diagnoses linked to this encounter.   No orders of the defined types were placed in this  encounter.    Follow-up: No follow-ups on file.  Walker Kehr, MD

## 2018-10-18 NOTE — Assessment & Plan Note (Signed)
On Nascobal

## 2018-10-18 NOTE — Assessment & Plan Note (Signed)
Aciphex 

## 2018-10-18 NOTE — Telephone Encounter (Signed)
Insurance has been submitted and verified for Prolia. Patient is responsible for a total of $650 (Deductibles: $198 + $400 plus 10%) Due anytime. Patient was in the office today. Because patient has already had other visits, nothing was paid today and she will wait for the bill to see what all insurance will cover.   Injection given during visit today (10/18/2018).

## 2018-11-07 ENCOUNTER — Other Ambulatory Visit: Payer: Self-pay | Admitting: Internal Medicine

## 2019-01-20 ENCOUNTER — Other Ambulatory Visit: Payer: Self-pay | Admitting: Internal Medicine

## 2019-01-23 ENCOUNTER — Other Ambulatory Visit: Payer: Self-pay

## 2019-01-23 ENCOUNTER — Other Ambulatory Visit (INDEPENDENT_AMBULATORY_CARE_PROVIDER_SITE_OTHER): Payer: Medicare Other

## 2019-01-23 ENCOUNTER — Other Ambulatory Visit: Payer: Medicare Other

## 2019-01-23 DIAGNOSIS — R7303 Prediabetes: Secondary | ICD-10-CM

## 2019-01-23 LAB — GLUCOSE, RANDOM: Glucose, Bld: 121 mg/dL — ABNORMAL HIGH (ref 70–99)

## 2019-01-23 LAB — HEMOGLOBIN A1C: Hgb A1c MFr Bld: 6.2 % (ref 4.6–6.5)

## 2019-01-30 ENCOUNTER — Other Ambulatory Visit: Payer: Self-pay

## 2019-01-30 ENCOUNTER — Ambulatory Visit: Payer: Medicare Other | Admitting: Endocrinology

## 2019-01-30 ENCOUNTER — Ambulatory Visit (INDEPENDENT_AMBULATORY_CARE_PROVIDER_SITE_OTHER): Payer: Medicare Other | Admitting: Endocrinology

## 2019-01-30 ENCOUNTER — Encounter: Payer: Self-pay | Admitting: Endocrinology

## 2019-01-30 DIAGNOSIS — R7303 Prediabetes: Secondary | ICD-10-CM | POA: Diagnosis not present

## 2019-01-30 NOTE — Progress Notes (Signed)
Patient ID: [name], female   DOB: 04/27/1946, 73 y.o.   MRN: [id number]           Today's office visit was provided via telemedicine using a telephone call  Explained to the patient and the the limitations of evaluation and management by telemedicine and the availability of in person appointments.  The patient understood the limitations and agreed to proceed. Patient also understood that the telehealth visit is billable. . Location of the patient: Home . Location of the provider: Office Only the patient and myself were participating in the encounter  Total duration of telephone call =12 minutes  Reason for Appointment: Follow up of prediabetes  Primary care physician: [name]  History of Present Illness:          She has prediabetes and also was told to have background retinopathy  On her periodic general checkups she has had an A1c done regularly and these have been at baseline upper normal around 6.0  She was evaluated with a glucose tolerance test in 10/2015 which showed the following: Fasting glucose 122, one-hour glucose 202 hour glucose 147  She was referred to the dietitian for meal planning and has been seen twice  Recent history:  She is back for her 55-month follow-up  Her A1c in July 2019 was higher at 6.4, and is now again 6.2  Fasting glucose is now 122, previously 126   Patient states that she has overall done better with her diet with controlling portions and eating less healthy food, probably eating out less over the last 2 to 3 months  She thinks her weight is down to 186 pounds which is about 8 pounds less than her previous measurement  She did see the dietitian again in 2019  She may do a little walking around the house or outside but very much at all and also not motivated to ride her exercise bike  Previously had been recommended water exercise but she did not sign up for those also  She is using a generic monitor from [facility] to check  her blood sugar   However she thinks that occasionally her blood sugar may be as high as 235 after eating  Also she thinks that sometimes in the mornings her fasting reading may be in the 70s even though her lab glucose is always around 120+  She is asking about the Inderal causing diabetes, she has been on this for about a year and a half   Weight history:  Wt Readings from Last 3 Encounters:  10/18/18 194 lb (88 kg)  10/16/18 194 lb (88 kg)  08/13/18 195 lb (88.5 kg)    Glycemic levels:   Lab Results  Component Value Date   HGBA1C 6.2 01/23/2019   HGBA1C 6.2 07/23/2018   HGBA1C 6.4 03/14/2018   Lab Results  Component Value Date   LDLCALC 93 03/14/2018   CREATININE 0.80 04/02/2018       Allergies as of 01/30/2019      Reactions   Actonel [risedronate Sodium]    Upset stomach   Boniva [ibandronate Sodium]    cramp   Calcium Channel Blockers    Upset stomach   Compazine    Daughter reacts to compazine/pt does not want to take   Lyrica [pregabalin]    Dizzy   Tape    redness      Medication List       Accurate as of Jan 30, 2019 10:54 AM. If you have any  questions, ask your nurse or doctor.        amLODipine 5 MG tablet Commonly known as:  NORVASC TAKE 1 TABLET BY MOUTH EVERYDAY AT BEDTIME   aspirin 81 MG EC tablet Take 81 mg by mouth daily.   Cyanocobalamin 500 MCG/0.1ML Soln Commonly known as:  Nascobal USE 1 SPRAY IN 1 NOSTRIL  ONCE A WEEK   denosumab 60 MG/ML Soln injection Commonly known as:  PROLIA Inject 60 mg into the skin every 6 (six) months. Administer in upper arm, thigh, or abdomen   diphenoxylate-atropine 2.5-0.025 MG tablet Commonly known as:  LOMOTIL TAKE 1 TABLET BY MOUTH FOUR TIMES A DAY AS NEEDED FOR DIARRHEA OR LOOSE STOOLS   Lumigan 0.01 % Soln Generic drug:  bimatoprost Place 1 drop into both eyes at bedtime.   propranolol ER 60 MG 24 hr capsule Commonly known as:  INDERAL LA TAKE ONE CAPSULE BY MOUTH TWICE A DAY    RABEprazole 20 MG tablet Commonly known as:  ACIPHEX TAKE 1 TABLET BY MOUTH  DAILY   timolol 0.5 % ophthalmic gel-forming Commonly known as:  TIMOPTIC-XR Place 1 drop into both eyes daily.   triamcinolone cream 0.1 % Commonly known as:  KENALOG Apply 1 application topically 2 (two) times daily as needed (ear bacteria).   Vitamin D3 1.25 MG (50000 UT) Caps TAKE ONE CAPSULE BY MOUTH EVERY 30 DAYS       Allergies:  Allergies  Allergen Reactions  . Actonel [Risedronate Sodium]     Upset stomach  . Boniva [Ibandronate Sodium]     cramp  . Calcium Channel Blockers     Upset stomach  . Compazine     Daughter reacts to compazine/pt does not want to take  . Lyrica [Pregabalin]     Dizzy   . Tape     redness    Past Medical History:  Diagnosis Date  . Adenomatous colon polyp 04/08/2011  . Anemia   . Anxiety   . Cataract   . Essential hypertension 08/10/2007   Chronic  On Catapress (per Dr Ubaldo Glassing) - increase to bid - d/c Hydralazine prn per Dr Caryl Comes d/c 3/19: Increase Inderal LA to bid  . GERD (gastroesophageal reflux disease)   . Glaucoma (increased eye pressure)   . Heart murmur   . HTN (hypertension)   . IBS (irritable bowel syndrome)   . LBP (low back pain)   . Menopause   . Osteoporosis   . Ovarian cancer ([place]) 09/2008   Dr Gwyneth Revels  . Pancreatic cyst   . Vitamin B12 deficiency   . Vitamin D deficiency     Past Surgical History:  Procedure Laterality Date  . ABDOMINAL HYSTERECTOMY    . APPENDECTOMY    . CHOLECYSTECTOMY N/A 05/15/2017   Procedure: LAPAROSCOPIC CHOLECYSTECTOMY WITH INTRAOPERATIVE CHOLANGIOGRAM AND LYSIS OF ADHESIONS;  Surgeon: Fanny Skates, MD;  Location: [facility];  Service: General;  Laterality: N/A;  . ELBOW FRACTURE SURGERY     age 27- left elbow  . [place]  2001  . Ovarian Cancer Debulking  09/2008    Family History  Problem Relation Age of Onset  . Alzheimer's disease Mother 11  . Other Mother 77       TAH for excessive  bleeding  . Lung cancer Father        smoker; metastasis to stomach and other areas  . Heart attack Maternal Uncle   . Other Paternal Aunt        stomach  issues  . Heart Problems Paternal Uncle   . Other Maternal Grandmother        stomach issues; +hysterectomy  . Heart attack Maternal Grandfather   . Infertility Daughter   . Stomach cancer Cousin        dx. mid-60s  . Other Cousin        stomach issues  . Leukemia Cousin 18  . Stomach cancer Other   . Cancer Cousin        unknown type  . Other Cousin        stomach issues  . Heart Problems Maternal Uncle   . Diabetes Paternal Aunt   . Heart Problems Paternal Uncle   . Emphysema Paternal Uncle        work exposure  . Breast cancer Cousin        dx. late 60s-early 70s  . Colon cancer Neg Hx   . Rectal cancer Neg Hx   . Esophageal cancer Neg Hx     Social History:  reports that she has never smoked. She has never used smokeless tobacco. She reports that she does not drink alcohol or use drugs.    Review of Systems     Lipid history: She has not been on any statin drugs, lipids to be followed up with PCP    Lab Results  Component Value Date   CHOL 168 03/14/2018   HDL 46.70 03/14/2018   LDLCALC 93 03/14/2018   TRIG 145.0 03/14/2018   CHOLHDL 4 03/14/2018         RETINOPATHY: She is followed by ophthalmologist for background retinopathy  She is having difficulty with her blood pressure control, does check at home with recent reading about 010 systolic She is also going to the nephrologist for hypertension  Review of Systems   LABS:  No visits with results within 1 Week(s) from this visit.  Latest known visit with results is:  Lab on 01/23/2019  Component Date Value Ref Range Status  . Glucose, Bld 01/23/2019 121* 70 - 99 mg/dL Final  . Hgb A1c MFr Bld 01/23/2019 6.2  4.6 - 6.5 % Final   Glycemic Control Guidelines for People with Diabetes:Non Diabetic:  <6%Goal of Therapy: <7%Additional Action Suggested:   >8%     Physical Examination:  There were no vitals taken for this visit.        ASSESSMENT/PLAN:  PREDIABETES with obesity:  Highest blood sugar was 126 fasting previously indicating very early diabetes  Her A1c has been in the prediabetic range, again 6.2  The patient has a lot of questions about her blood sugar targets, medication causing diabetes and heart diagnosis itself She thinks she has lost 8 pounds with improved diet over the last 2 or 3 months However still not motivated to exercise much and may only do some walking or exercise bike  She has a [employer] meter and not clear if her home blood sugars are accurate Asked her to check the expiration date on her strips as she checks infrequently She does not necessarily need to check her sugars at home  Reassured her that Inderal does not cause diabetes Important to have her keep sugars under control especially with history of background retinopathy Also discussed possibility of needing metformin if her A1c or glucose goes up Majority of her visit, over 50% today was spent in counseling  She does need follow-up lipids with her PCP  Follow-up in 6 months again   Elayne Snare 01/30/2019, 10:54 AM  Note: This office note was prepared with Dragon voice recognition system technology. Any transcriptional errors that result from this process are unintentional.  

## 2019-02-06 ENCOUNTER — Ambulatory Visit: Payer: Medicare Other | Admitting: Internal Medicine

## 2019-02-22 ENCOUNTER — Encounter: Payer: Self-pay | Admitting: Internal Medicine

## 2019-02-22 ENCOUNTER — Ambulatory Visit (INDEPENDENT_AMBULATORY_CARE_PROVIDER_SITE_OTHER): Payer: Medicare Other | Admitting: Internal Medicine

## 2019-02-22 ENCOUNTER — Other Ambulatory Visit: Payer: Self-pay

## 2019-02-22 DIAGNOSIS — B029 Zoster without complications: Secondary | ICD-10-CM

## 2019-02-22 MED ORDER — VALACYCLOVIR HCL 1 G PO TABS: 1000.0000 mg | ORAL_TABLET | Freq: Three times a day (TID) | ORAL | 0 refills | Status: DC

## 2019-02-22 MED ORDER — GABAPENTIN 100 MG PO CAPS: 100.0000 mg | ORAL_CAPSULE | Freq: Three times a day (TID) | ORAL | 0 refills | Status: DC

## 2019-02-22 NOTE — Progress Notes (Signed)
Virtual Visit via Video Note  I connected with [name] on 02/22/19 at  2:00 PM EDT by a video enabled telemedicine application and verified that I am speaking with the correct person using two identifiers.  The patient and the provider were at separate locations throughout the entire encounter.   I discussed the limitations of evaluation and management by telemedicine and the availability of in person appointments. The patient expressed understanding and agreed to proceed.  History of Present Illness: The patient is a 73 y.o. female with visit for potential shingles. She did have shingles about 25 years ago on the lower body so this seems similar. She had a twinge on Monday on the left back. She then started having a rash and pain on the right shoulder/neck Tuesday. She denies taking anything for it. Now it is getting worse. The pain is 7/10 and worst at night time. Has no fevers or chills. Denies the rash moving. Overall it is stable to mild worsening. Has tried nothing  Observations/Objective: Appearance: normal, rash on the right shoulder consistent with shingles, does not cross midline, breathing appears normal, casual grooming, abdomen does not appear distended, throat normal, memory normal, mental status is A and O times 3  Assessment and Plan: See problem oriented charting  Follow Up Instructions: rx valtrex and gabapentin, precautions discussed  I discussed the assessment and treatment plan with the patient. The patient was provided an opportunity to ask questions and all were answered. The patient agreed with the plan and demonstrated an understanding of the instructions.   The patient was advised to call back or seek an in-person evaluation if the symptoms worsen or if the condition fails to improve as anticipated.  Hoyt Koch, MD

## 2019-02-22 NOTE — Assessment & Plan Note (Signed)
Rx for valtrex TID 1 week and gabapentin for pain.

## 2019-02-26 ENCOUNTER — Other Ambulatory Visit: Payer: Medicare Other

## 2019-03-01 ENCOUNTER — Ambulatory Visit: Payer: Medicare Other | Admitting: Endocrinology

## 2019-03-01 ENCOUNTER — Other Ambulatory Visit: Payer: Self-pay | Admitting: Internal Medicine

## 2019-03-14 ENCOUNTER — Ambulatory Visit: Payer: Self-pay | Admitting: Neurology

## 2019-03-16 ENCOUNTER — Other Ambulatory Visit: Payer: Self-pay | Admitting: Internal Medicine

## 2019-04-22 ENCOUNTER — Ambulatory Visit (INDEPENDENT_AMBULATORY_CARE_PROVIDER_SITE_OTHER): Payer: Medicare Other | Admitting: Internal Medicine

## 2019-04-22 ENCOUNTER — Other Ambulatory Visit: Payer: Self-pay

## 2019-04-22 ENCOUNTER — Encounter: Payer: Self-pay | Admitting: Internal Medicine

## 2019-04-22 ENCOUNTER — Other Ambulatory Visit (INDEPENDENT_AMBULATORY_CARE_PROVIDER_SITE_OTHER): Payer: Medicare Other

## 2019-04-22 VITALS — BP 160/80 | HR 63 | Temp 98.2°F | Ht 64.0 in | Wt 194.0 lb

## 2019-04-22 DIAGNOSIS — E538 Deficiency of other specified B group vitamins: Secondary | ICD-10-CM

## 2019-04-22 DIAGNOSIS — M810 Age-related osteoporosis without current pathological fracture: Secondary | ICD-10-CM

## 2019-04-22 DIAGNOSIS — C5700 Malignant neoplasm of unspecified fallopian tube: Secondary | ICD-10-CM

## 2019-04-22 DIAGNOSIS — R7303 Prediabetes: Secondary | ICD-10-CM | POA: Diagnosis not present

## 2019-04-22 DIAGNOSIS — R739 Hyperglycemia, unspecified: Secondary | ICD-10-CM | POA: Diagnosis not present

## 2019-04-22 DIAGNOSIS — M25561 Pain in right knee: Secondary | ICD-10-CM

## 2019-04-22 DIAGNOSIS — I10 Essential (primary) hypertension: Secondary | ICD-10-CM | POA: Diagnosis not present

## 2019-04-22 DIAGNOSIS — B029 Zoster without complications: Secondary | ICD-10-CM | POA: Diagnosis not present

## 2019-04-22 DIAGNOSIS — E785 Hyperlipidemia, unspecified: Secondary | ICD-10-CM

## 2019-04-22 DIAGNOSIS — M25569 Pain in unspecified knee: Secondary | ICD-10-CM | POA: Insufficient documentation

## 2019-04-22 DIAGNOSIS — R55 Syncope and collapse: Secondary | ICD-10-CM

## 2019-04-22 DIAGNOSIS — K580 Irritable bowel syndrome with diarrhea: Secondary | ICD-10-CM | POA: Diagnosis not present

## 2019-04-22 LAB — URINALYSIS
Bilirubin Urine: NEGATIVE
Ketones, ur: NEGATIVE
Leukocytes,Ua: NEGATIVE
Nitrite: NEGATIVE
Specific Gravity, Urine: 1.01 (ref 1.000–1.030)
Total Protein, Urine: NEGATIVE
Urine Glucose: NEGATIVE
Urobilinogen, UA: 0.2 (ref 0.0–1.0)
pH: 6 (ref 5.0–8.0)

## 2019-04-22 LAB — BASIC METABOLIC PANEL
BUN: 15 mg/dL (ref 6–23)
CO2: 26 mEq/L (ref 19–32)
Calcium: 9.7 mg/dL (ref 8.4–10.5)
Chloride: 103 mEq/L (ref 96–112)
Creatinine, Ser: 0.83 mg/dL (ref 0.40–1.20)
GFR: 67.32 mL/min (ref >60.00)
Glucose, Bld: 105 mg/dL — ABNORMAL HIGH (ref 70–99)
Potassium: 3.6 mEq/L (ref 3.5–5.1)
Sodium: 141 mEq/L (ref 135–145)

## 2019-04-22 LAB — HEPATIC FUNCTION PANEL
ALT: 20 U/L (ref 0–35)
AST: 19 U/L (ref 0–37)
Albumin: 4.7 g/dL (ref 3.5–5.2)
Alkaline Phosphatase: 69 U/L (ref 39–117)
Bilirubin, Direct: 0.1 mg/dL (ref 0.0–0.3)
Total Bilirubin: 0.6 mg/dL (ref 0.2–1.2)
Total Protein: 8.2 g/dL (ref 6.0–8.3)

## 2019-04-22 LAB — HEMOGLOBIN A1C: Hgb A1c MFr Bld: 6.1 % (ref 4.6–6.5)

## 2019-04-22 MED ORDER — DENOSUMAB 60 MG/ML ~~LOC~~ SOSY
60.0000 mg | PREFILLED_SYRINGE | Freq: Once | SUBCUTANEOUS | Status: AC
  Administered 2019-04-22: 14:00:00 60 mg via SUBCUTANEOUS

## 2019-04-22 NOTE — Assessment & Plan Note (Signed)
Pain resolved.   

## 2019-04-22 NOTE — Assessment & Plan Note (Signed)
Prolia inj

## 2019-04-22 NOTE — Assessment & Plan Note (Signed)
A1c

## 2019-04-22 NOTE — Patient Instructions (Signed)
If you have medicare related insurance (such as traditional Medicare, Blue Cross Medicare, United HealthCare Medicare, or similar), Please make an appointment at the scheduling desk with Jill, the Wellness Health Coach, for your Wellness visit in this office, which is a benefit with your insurance.    These suggestions will probably help you to improve your metabolism if you are not overweight and to lose weight if you are overweight: 1.  Reduce your consumption of sugars and starches.  Eliminate high fructose corn syrup from your diet.  Reduce your consumption of processed foods.  For desserts try to have seasonal fruits, berries, nuts, cheeses or dark chocolate with more than 70% cacao. 2.  Do not snack 3.  You do not have to eat breakfast.  If you choose to have breakfast-eat plain greek yogurt, eggs, oatmeal (without sugar) 4.  Drink water, freshly brewed unsweetened tea (green, black or herbal) or coffee.  Do not drink sodas including diet sodas , juices, beverages sweetened with artificial sweeteners. 5.  Reduce your consumption of refined grains. 6.  Avoid protein drinks such as Optifast, Slim fast etc. Eat chicken, fish, meat, dairy and beans for your sources of protein 7.  Natural unprocessed fats like cold pressed virgin olive oil, butter, coconut oil are good for you.  Eat avocados 8.  Increase your consumption of fiber.  Fruits, berries, vegetables, whole grains, flaxseeds, Chia seeds, beans, popcorn, nuts, oatmeal are good sources of fiber 9.  Use vinegar in your diet, i.e. apple cider vinegar, red wine or balsamic vinegar 10.  You can try fasting.  For example you can skip breakfast and lunch every other day (24-hour fast) 11.  Stress reduction, good night sleep, relaxation, meditation, yoga and other physical activity is likely to help you to maintain low weight too. 12.  If you drink alcohol, limit your alcohol intake to no more than 2 drinks a day.   Mediterranean diet is good for  you. ([employer] has a typical Mediterranean cuisine menu) The Mediterranean diet is a way of eating based on the traditional cuisine of countries bordering the [place] Sea. While there is no single definition of the Mediterranean diet, it is typically high in vegetables, fruits, whole grains, beans, nut and seeds, and olive oil. The main components of Mediterranean diet include: . Daily consumption of vegetables, fruits, whole grains and healthy fats  . Weekly intake of fish, poultry, beans and eggs  . Moderate portions of dairy products  . Limited intake of red meat Other important elements of the Mediterranean diet are sharing meals with family and friends, enjoying a glass of red wine and being physically active. Health benefits of a Mediterranean diet: A traditional Mediterranean diet consisting of large quantities of fresh fruits and vegetables, nuts, fish and olive oil-coupled with physical activity-can reduce your risk of serious mental and physical health problems by: Preventing heart disease and strokes. Following a Mediterranean diet limits your intake of refined breads, processed foods, and red meat, and encourages drinking red wine instead of hard liquor-all factors that can help prevent heart disease and stroke. Keeping you agile. If you're an older adult, the nutrients gained with a Mediterranean diet may reduce your risk of developing muscle weakness and other signs of frailty by about 70 percent. Reducing the risk of Alzheimer's. Research suggests that the Mediterranean diet may improve cholesterol, blood sugar levels, and overall blood vessel health, which in turn may reduce your risk of Alzheimer's disease or dementia. Halving the   risk of Parkinson's disease. The high levels of antioxidants in the Mediterranean diet can prevent cells from undergoing a damaging process called oxidative stress, thereby cutting the risk of Parkinson's disease in half. Increasing longevity. By  reducing your risk of developing heart disease or cancer with the Mediterranean diet, you're reducing your risk of death at any age by 20%. Protecting against type 2 diabetes. A Mediterranean diet is rich in fiber which digests slowly, prevents huge swings in blood sugar, and can help you maintain a healthy weight.    Cabbage soup recipe that will not make you gain weight: Take 1 small head of cabbage, 1 average pack of celery, 4 green peppers, 4 onions, 2 cans diced tomatoes (they are not available without salt), salt and spices to taste.  Chop cabbage, celery, peppers and onions.  And tomatoes and 2-2.5 liters (2.5 quarts) of water so that it would just cover the vegetables.  Bring to boil.  Add spices and salt.  Turn heat to low/medium and simmer for 20-25 minutes.  Naturally, you can make a smaller batch and change some of the ingredients.  

## 2019-04-22 NOTE — Assessment & Plan Note (Signed)
No relapse 

## 2019-04-22 NOTE — Addendum Note (Signed)
Addended by: Cresenciano Lick on: 04/22/2019 02:22 PM   Modules accepted: Orders

## 2019-04-22 NOTE — Assessment & Plan Note (Signed)
Voltaren gel  Ice 

## 2019-04-22 NOTE — Assessment & Plan Note (Addendum)
Nephrol f/u -- Dr Joelyn Oms  Propranolol

## 2019-04-22 NOTE — Assessment & Plan Note (Signed)
On B12 

## 2019-04-22 NOTE — Progress Notes (Signed)
Subjective:  Patient ID: [name], female    DOB: 1946/03/09  Age: 73 y.o. MRN: [id number]  CC: No chief complaint on file.   HPI [name] presents for B12 def, GERD, HTN f/u  Outpatient Medications Prior to Visit  Medication Sig Dispense Refill  . amLODipine (NORVASC) 5 MG tablet TAKE 1 TABLET BY MOUTH EVERYDAY AT BEDTIME  2  . aspirin 81 MG EC tablet Take 81 mg by mouth daily.      . bimatoprost (LUMIGAN) 0.01 % SOLN Place 1 drop into both eyes at bedtime.    . Cholecalciferol (VITAMIN D3) 1.25 MG (50000 UT) CAPS TAKE ONE CAPSULE BY MOUTH EVERY 30 DAYS 3 capsule 3  . Cyanocobalamin (NASCOBAL) 500 MCG/0.1ML SOLN USE 1 SPRAY IN 1 NOSTRIL  ONCE A WEEK 3 Bottle 3  . denosumab (PROLIA) 60 MG/ML SOLN injection Inject 60 mg into the skin every 6 (six) months. Administer in upper arm, thigh, or abdomen    . diphenoxylate-atropine (LOMOTIL) 2.5-0.025 MG tablet TAKE 1 TABLET BY MOUTH FOUR TIMES A DAY AS NEEDED FOR DIARRHEA OR LOOSE STOOLS 60 tablet 3  . gabapentin (NEURONTIN) 100 MG capsule TAKE 1 CAPSULE BY MOUTH THREE TIMES A DAY 270 capsule 1  . propranolol ER (INDERAL LA) 60 MG 24 hr capsule TAKE ONE CAPSULE BY MOUTH TWICE A DAY 180 capsule 3  . RABEprazole (ACIPHEX) 20 MG tablet TAKE 1 TABLET BY MOUTH EVERY DAY 90 tablet 1  . timolol (TIMOPTIC-XR) 0.5 % ophthalmic gel-forming Place 1 drop into both eyes daily.     [name] triamcinolone cream (KENALOG) 0.1 % Apply 1 application topically 2 (two) times daily as needed (ear bacteria).     . valACYclovir (VALTREX) 1000 MG tablet Take 1 tablet (1,000 mg total) by mouth 3 (three) times daily. 21 tablet 0   No facility-administered medications prior to visit.     ROS: Review of Systems  Constitutional: Negative for activity change, appetite change, chills, fatigue and unexpected weight change.  HENT: Negative for congestion, mouth sores and sinus pressure.   Eyes: Negative for visual disturbance.  Respiratory: Negative for cough  and chest tightness.   Gastrointestinal: Negative for abdominal pain and nausea.  Genitourinary: Negative for difficulty urinating, frequency and vaginal pain.  Musculoskeletal: Negative for back pain and gait problem.  Skin: Negative for pallor and rash.  Neurological: Negative for dizziness, tremors, weakness, numbness and headaches.  Psychiatric/Behavioral: Negative for confusion and sleep disturbance. The patient is nervous/anxious.     Objective:  BP (!) 160/80 (BP Location: Left Arm, Patient Position: Sitting, Cuff Size: Large)   Pulse 63   Temp 98.2 F (36.8 C) (Oral)   Ht 5\' 4"  (1.626 m)   Wt 194 lb (88 kg)   SpO2 97%   BMI 33.30 kg/m   BP Readings from Last 3 Encounters:  04/22/19 (!) 160/80  10/18/18 132/68  10/16/18 (!) 140/59    Wt Readings from Last 3 Encounters:  04/22/19 194 lb (88 kg)  10/18/18 194 lb (88 kg)  10/16/18 194 lb (88 kg)    Physical Exam Constitutional:      General: She is not in acute distress.    Appearance: She is well-developed. She is obese.  HENT:     Head: Normocephalic.     Right Ear: External ear normal.     Left Ear: External ear normal.     Nose: Nose normal.  Eyes:     General:  Right eye: No discharge.        Left eye: No discharge.     Conjunctiva/sclera: Conjunctivae normal.     Pupils: Pupils are equal, round, and reactive to light.  Neck:     Musculoskeletal: Normal range of motion and neck supple.     Thyroid: No thyromegaly.     Vascular: No JVD.     Trachea: No tracheal deviation.  Cardiovascular:     Rate and Rhythm: Normal rate and regular rhythm.     Heart sounds: Normal heart sounds.  Pulmonary:     Effort: No respiratory distress.     Breath sounds: No stridor. No wheezing.  Abdominal:     General: Bowel sounds are normal. There is no distension.     Palpations: Abdomen is soft. There is no mass.     Tenderness: There is no abdominal tenderness. There is no guarding or rebound.  Musculoskeletal:         General: No tenderness.  Lymphadenopathy:     Cervical: No cervical adenopathy.  Skin:    Findings: No erythema or rash.  Neurological:     Cranial Nerves: No cranial nerve deficit.     Motor: No abnormal muscle tone.     Coordination: Coordination normal.     Deep Tendon Reflexes: Reflexes normal.  Psychiatric:        Mood and Affect: Mood normal.        Behavior: Behavior normal.        Thought Content: Thought content normal.        Judgment: Judgment normal.   obese anxious a little  R med knee - tender   Lab Results  Component Value Date   WBC 6.3 04/02/2018   HGB 11.2 (L) 04/02/2018   HCT 33.0 (L) 04/02/2018   PLT 193 04/02/2018   GLUCOSE 121 (H) 01/23/2019   CHOL 168 03/14/2018   TRIG 145.0 03/14/2018   HDL 46.70 03/14/2018   LDLCALC 93 03/14/2018   ALT 18 10/25/2017   AST 17 10/25/2017   NA 145 04/02/2018   K 3.6 04/02/2018   CL 106 04/02/2018   CREATININE 0.80 04/02/2018   BUN 13 04/02/2018   CO2 28 11/20/2017   TSH 1.50 07/23/2018   HGBA1C 6.2 01/23/2019    Mr Brain Wo Contrast  Result Date: 04/19/2018 [facility] NEUROIMAG[facility] REPORT STUDY DATE: 04/18/18 PATIENT NAME: [name] DOB: 03/11/1946 MRN: [id number] ORDERING CLINICIAN: Kathrynn Ducking, MD CLINICAL HISTORY: 73 year old female with dizziness. EXAM: MRI brain (without) TECHNIQUE: MRI of the brain without contrast was obtained utilizing 5 mm axial slices with T1, T2, T2 flair, SWI and diffusion weighted views.  T1 sagittal and T2 coronal views were obtained. CONTRAST: no COMPARISON: 11/23/16 IMAGING SITE: [facility] [address] (3 Tesla MRI)  FINDINGS: No abnormal lesions are seen on diffusion-weighted views to suggest acute ischemia. The cortical sulci, fissures and cisterns are normal in size and appearance. Lateral, third and fourth ventricle are normal in size and appearance. No extra-axial fluid collections are seen. No evidence of mass effect or  midline shift. Mild scattered periventricular and subcortical foci of non-specific gliosis. On sagittal views the posterior fossa, pituitary gland and corpus callosum are unremarkable. No evidence of intracranial hemorrhage on SWI views. The orbits and their contents, paranasal sinuses and calvarium are unremarkable.  Intracranial flow voids are present.   MRI brain (without) demonstrating: - Mild scattered periventricular and subcortical foci of non-specific gliosis. -  No acute findings. INTERPRETING PHYSICIAN: Penni Bombard, MD Certified in Neurology, Neurophysiology and Neuroimaging [facility] [address], [place], [place] [address] [phone]    Assessment & Plan:   There are no diagnoses linked to this encounter.   No orders of the defined types were placed in this encounter.    Follow-up: No follow-ups on file.  Walker Kehr, MD

## 2019-04-22 NOTE — Assessment & Plan Note (Signed)
Gluten free diet 

## 2019-04-22 NOTE — Assessment & Plan Note (Signed)
CT ca score

## 2019-04-24 LAB — CA 125: CA 125: 10 U/mL (ref <35)

## 2019-05-01 ENCOUNTER — Ambulatory Visit (INDEPENDENT_AMBULATORY_CARE_PROVIDER_SITE_OTHER): Payer: Medicare Other | Admitting: Neurology

## 2019-05-01 ENCOUNTER — Encounter: Payer: Self-pay | Admitting: Neurology

## 2019-05-01 ENCOUNTER — Other Ambulatory Visit: Payer: Self-pay

## 2019-05-01 VITALS — BP 166/71 | HR 57 | Temp 97.7°F | Ht 64.0 in | Wt 195.0 lb

## 2019-05-01 DIAGNOSIS — R42 Dizziness and giddiness: Secondary | ICD-10-CM

## 2019-05-01 NOTE — Progress Notes (Signed)
Reason for visit: Dizziness  [name] is an 73 y.o. female  History of present illness:  [name] is a 73 year old right-handed white female with a history of episodic dizziness.  The patient has noted that if she stoops to pick up something and comes up she may get slightly woozy or dizzy.  She has not had any blackouts or falls because of this.  Overall, her episodes of dizziness have improved considerably.  She does have ongoing issues with blood pressure, she typically has systolic blood pressures running in the 150-160 range.  She suffered from a C3 level right-sided shingles outbreak in June 2020, but she has recovered from this.  She is not exercising much, she is trying to lose weight.  She does have intermittent diarrhea usually in the morning, and she may have some right lower quadrant pain associated with this.  Otherwise, she has not had any significant changes in her underlying medical condition.  She returns for an evaluation.  She currently is not getting any medications through this office.  Past Medical History:  Diagnosis Date  . Adenomatous colon polyp 04/08/2011  . Anemia   . Anxiety   . Cataract   . Essential hypertension 08/10/2007   Chronic  On Catapress (per Dr Ubaldo Glassing) - increase to bid - d/c Hydralazine prn per Dr Caryl Comes d/c 3/19: Increase Inderal LA to bid  . GERD (gastroesophageal reflux disease)   . Glaucoma (increased eye pressure)   . Heart murmur   . HTN (hypertension)   . IBS (irritable bowel syndrome)   . LBP (low back pain)   . Menopause   . Osteoporosis   . Ovarian cancer ([place]) 09/2008   Dr Gwyneth Revels  . Pancreatic cyst   . Vitamin B12 deficiency   . Vitamin D deficiency     Past Surgical History:  Procedure Laterality Date  . ABDOMINAL HYSTERECTOMY    . APPENDECTOMY    . CHOLECYSTECTOMY N/A 05/15/2017   Procedure: LAPAROSCOPIC CHOLECYSTECTOMY WITH INTRAOPERATIVE CHOLANGIOGRAM AND LYSIS OF ADHESIONS;  Surgeon: Fanny Skates, MD;   Location: [facility];  Service: General;  Laterality: N/A;  . ELBOW FRACTURE SURGERY     age 73- left elbow  . [place]  2001  . Ovarian Cancer [place]  09/2008    Family History  Problem Relation Age of Onset  . Alzheimer's disease Mother 5  . Other Mother 55       TAH for excessive bleeding  . Lung cancer Father        smoker; metastasis to stomach and other areas  . Heart attack Maternal Uncle   . Other Paternal Aunt        stomach issues  . Heart Problems Paternal Uncle   . Other Maternal Grandmother        stomach issues; +hysterectomy  . Heart attack Maternal Grandfather   . Infertility Daughter   . Stomach cancer Cousin        dx. mid-60s  . Other Cousin        stomach issues  . Leukemia Cousin 18  . Stomach cancer Other   . Cancer Cousin        unknown type  . Other Cousin        stomach issues  . Heart Problems Maternal Uncle   . Diabetes Paternal Aunt   . Heart Problems Paternal Uncle   . Emphysema Paternal Uncle        work exposure  . Breast  cancer Cousin        dx. late 60s-early 70s  . Colon cancer Neg Hx   . Rectal cancer Neg Hx   . Esophageal cancer Neg Hx     Social history:  reports that she has never smoked. She has never used smokeless tobacco. She reports that she does not drink alcohol or use drugs.    Allergies  Allergen Reactions  . Actonel [Risedronate Sodium]     Upset stomach  . Boniva [Ibandronate Sodium]     cramp  . Calcium Channel Blockers     Upset stomach  . Compazine     Daughter reacts to compazine/pt does not want to take  . Lyrica [Pregabalin]     Dizzy   . Tape     redness    Medications:  Prior to Admission medications   Medication Sig Start Date End Date Taking? Authorizing Provider  amLODipine (NORVASC) 5 MG tablet TAKE 1 TABLET BY MOUTH EVERYDAY AT BEDTIME 07/03/18  Yes [provider]  aspirin 81 MG EC tablet Take 81 mg by mouth daily.     Yes [provider]  bimatoprost  (LUMIGAN) 0.01 % SOLN Place 1 drop into both eyes at bedtime.   Yes [provider]  Cholecalciferol (VITAMIN D3) 1.25 MG (50000 UT) CAPS TAKE ONE CAPSULE BY MOUTH EVERY 30 DAYS 11/08/18  Yes [name], Evie Lacks, MD  Cyanocobalamin (NASCOBAL) 500 MCG/0.1ML SOLN USE 1 SPRAY IN 1 NOSTRIL  ONCE A WEEK 10/18/18  Yes [name], Evie Lacks, MD  denosumab (PROLIA) 60 MG/ML SOLN injection Inject 60 mg into the skin every 6 (six) months. Administer in upper arm, thigh, or abdomen 09/02/13  Yes [provider]  diphenoxylate-atropine (LOMOTIL) 2.5-0.025 MG tablet TAKE 1 TABLET BY MOUTH FOUR TIMES A DAY AS NEEDED FOR DIARRHEA OR LOOSE STOOLS 11/08/18  Yes [name], Evie Lacks, MD  gabapentin (NEURONTIN) 100 MG capsule TAKE 1 CAPSULE BY MOUTH THREE TIMES A DAY 03/19/19  Yes [name], Evie Lacks, MD  propranolol ER (INDERAL LA) 60 MG 24 hr capsule TAKE ONE CAPSULE BY MOUTH TWICE A DAY 09/17/18  Yes [name], Evie Lacks, MD  RABEprazole (ACIPHEX) 20 MG tablet TAKE 1 TABLET BY MOUTH EVERY DAY 03/01/19  Yes [name], Evie Lacks, MD  timolol (TIMOPTIC-XR) 0.5 % ophthalmic gel-forming Place 1 drop into both eyes daily.  03/03/11  Yes [provider]  triamcinolone cream (KENALOG) 0.1 % Apply 1 application topically 2 (two) times daily as needed (ear bacteria).    Yes [provider]    ROS:  Out of a complete 14 system review of symptoms, the patient complains only of the following symptoms, and all other reviewed systems are negative.  Dizziness Diarrhea Stomach cramps  Blood pressure (!) 166/71, pulse (!) 57, temperature 97.7 F (36.5 C), height 5\' 4"  (1.626 m), weight 195 lb (88.5 kg).  Physical Exam  General: The patient is alert and cooperative at the time of the examination.  The patient is moderately obese.  Skin: No significant peripheral edema is noted.   Neurologic Exam  Mental status: The patient is alert and oriented x 3 at the time of the examination. The patient  has apparent normal recent and remote memory, with an apparently normal attention span and concentration ability.   Cranial nerves: Facial symmetry is present. Speech is normal, no aphasia or dysarthria is noted. Extraocular movements are full. Visual fields are full.  Motor: The patient has good strength in all 4 extremities.  Sensory examination: Soft touch sensation is symmetric on the face, arms, and legs.  Coordination: The patient has good finger-nose-finger and heel-to-shin bilaterally.  Gait and station: The patient has a normal gait. Tandem gait is slightly unsteady. Romberg is negative. No drift is seen.  Reflexes: Deep tendon reflexes are symmetric.   Assessment/Plan:  1.  Episodic dizziness  2.  Hypertension  The patient still has somewhat poorly controlled hypertension.  I have indicated that trying to get her blood pressures consistently under 140 would be her goal.  The patient may need to contact her primary care physician concerning this issue.  At this point, she is doing somewhat better with her dizziness, we will see her back on an as-needed basis.  Jill Alexanders MD 05/01/2019 11:45 AM  [facility] [address] [place], [place] [address]  Phone [phone] Fax [phone]

## 2019-05-06 DIAGNOSIS — D2371 Other benign neoplasm of skin of right lower limb, including hip: Secondary | ICD-10-CM | POA: Diagnosis not present

## 2019-05-06 DIAGNOSIS — L218 Other seborrheic dermatitis: Secondary | ICD-10-CM | POA: Diagnosis not present

## 2019-05-06 DIAGNOSIS — Z85828 Personal history of other malignant neoplasm of skin: Secondary | ICD-10-CM | POA: Diagnosis not present

## 2019-05-06 DIAGNOSIS — D1801 Hemangioma of skin and subcutaneous tissue: Secondary | ICD-10-CM | POA: Diagnosis not present

## 2019-05-06 DIAGNOSIS — L821 Other seborrheic keratosis: Secondary | ICD-10-CM | POA: Diagnosis not present

## 2019-05-06 DIAGNOSIS — L718 Other rosacea: Secondary | ICD-10-CM | POA: Diagnosis not present

## 2019-05-06 DIAGNOSIS — L738 Other specified follicular disorders: Secondary | ICD-10-CM | POA: Diagnosis not present

## 2019-05-21 DIAGNOSIS — R0989 Other specified symptoms and signs involving the circulatory and respiratory systems: Secondary | ICD-10-CM | POA: Diagnosis not present

## 2019-05-22 DIAGNOSIS — H40013 Open angle with borderline findings, low risk, bilateral: Secondary | ICD-10-CM | POA: Diagnosis not present

## 2019-06-04 DIAGNOSIS — R0989 Other specified symptoms and signs involving the circulatory and respiratory systems: Secondary | ICD-10-CM | POA: Diagnosis not present

## 2019-06-11 ENCOUNTER — Ambulatory Visit (INDEPENDENT_AMBULATORY_CARE_PROVIDER_SITE_OTHER)
Admission: RE | Admit: 2019-06-11 | Discharge: 2019-06-11 | Disposition: A | Discharge status: Home / Self Care | Payer: Medicare Other | Source: Ambulatory Visit | Attending: Internal Medicine | Admitting: Internal Medicine

## 2019-06-11 ENCOUNTER — Other Ambulatory Visit: Payer: Self-pay

## 2019-06-11 DIAGNOSIS — I10 Essential (primary) hypertension: Secondary | ICD-10-CM

## 2019-06-11 DIAGNOSIS — E785 Hyperlipidemia, unspecified: Secondary | ICD-10-CM

## 2019-07-24 ENCOUNTER — Ambulatory Visit (INDEPENDENT_AMBULATORY_CARE_PROVIDER_SITE_OTHER): Payer: Medicare Other | Admitting: Internal Medicine

## 2019-07-24 ENCOUNTER — Other Ambulatory Visit: Payer: Self-pay

## 2019-07-24 ENCOUNTER — Encounter: Payer: Self-pay | Admitting: Internal Medicine

## 2019-07-24 VITALS — BP 130/80 | HR 62 | Temp 97.8°F | Ht 64.0 in | Wt 182.0 lb

## 2019-07-24 DIAGNOSIS — I10 Essential (primary) hypertension: Secondary | ICD-10-CM

## 2019-07-24 DIAGNOSIS — R197 Diarrhea, unspecified: Secondary | ICD-10-CM

## 2019-07-24 DIAGNOSIS — Z23 Encounter for immunization: Secondary | ICD-10-CM

## 2019-07-24 DIAGNOSIS — E538 Deficiency of other specified B group vitamins: Secondary | ICD-10-CM | POA: Diagnosis not present

## 2019-07-24 MED ORDER — FUROSEMIDE 20 MG PO TABS: 10.0000 mg | ORAL_TABLET | Freq: Every day | ORAL | 1 refills | Status: DC | PRN

## 2019-07-24 NOTE — Progress Notes (Signed)
Subjective:  Patient ID: [name], female    DOB: 06-10-1946  Age: 73 y.o. MRN: [id number]  CC: No chief complaint on file.   HPI [name] presents for HTN,  Tachycardia and Vit D def C/o am diarrhea after she started chlorthalidone in Sept 2020 Can't take amlodipine 10 mg  Outpatient Medications Prior to Visit  Medication Sig Dispense Refill  . amLODipine (NORVASC) 5 MG tablet TAKE 1 TABLET BY MOUTH EVERYDAY AT BEDTIME  2  . aspirin 81 MG EC tablet Take 81 mg by mouth daily.      . bimatoprost (LUMIGAN) 0.01 % SOLN Place 1 drop into both eyes at bedtime.    . chlorthalidone (HYGROTON) 25 MG tablet TAKE 1 TABLET BY MOUTH EVERY DAY IN THE MORNING    . Cholecalciferol (VITAMIN D3) 1.25 MG (50000 UT) CAPS TAKE ONE CAPSULE BY MOUTH EVERY 30 DAYS 3 capsule 3  . Cyanocobalamin (NASCOBAL) 500 MCG/0.1ML SOLN USE 1 SPRAY IN 1 NOSTRIL  ONCE A WEEK 3 Bottle 3  . denosumab (PROLIA) 60 MG/ML SOLN injection Inject 60 mg into the skin every 6 (six) months. Administer in upper arm, thigh, or abdomen    . diphenoxylate-atropine (LOMOTIL) 2.5-0.025 MG tablet TAKE 1 TABLET BY MOUTH FOUR TIMES A DAY AS NEEDED FOR DIARRHEA OR LOOSE STOOLS 60 tablet 3  . gabapentin (NEURONTIN) 100 MG capsule TAKE 1 CAPSULE BY MOUTH THREE TIMES A DAY 270 capsule 1  . propranolol ER (INDERAL LA) 60 MG 24 hr capsule TAKE ONE CAPSULE BY MOUTH TWICE A DAY (Patient taking differently: daily. ) 180 capsule 3  . RABEprazole (ACIPHEX) 20 MG tablet TAKE 1 TABLET BY MOUTH EVERY DAY 90 tablet 1  . timolol (TIMOPTIC-XR) 0.5 % ophthalmic gel-forming Place 1 drop into both eyes daily.     [name] triamcinolone cream (KENALOG) 0.1 % Apply 1 application topically 2 (two) times daily as needed (ear bacteria).      No facility-administered medications prior to visit.     ROS: Review of Systems  Constitutional: Positive for unexpected weight change. Negative for activity change, appetite change, chills and fatigue.  HENT:  Negative for congestion, mouth sores and sinus pressure.   Eyes: Negative for visual disturbance.  Respiratory: Negative for cough and chest tightness.   Gastrointestinal: Positive for diarrhea. Negative for abdominal pain and nausea.  Genitourinary: Negative for difficulty urinating, frequency and vaginal pain.  Musculoskeletal: Negative for back pain and gait problem.  Skin: Negative for pallor and rash.  Neurological: Negative for dizziness, tremors, weakness, numbness and headaches.  Psychiatric/Behavioral: Negative for confusion, sleep disturbance and suicidal ideas.    Objective:  BP 130/80 (BP Location: Left Arm, Patient Position: Sitting, Cuff Size: Large)   Pulse 62   Temp 97.8 F (36.6 C) (Oral)   Ht 5\' 4"  (1.626 m)   Wt 182 lb (82.6 kg)   SpO2 98%   BMI 31.24 kg/m   BP Readings from Last 3 Encounters:  07/24/19 130/80  05/01/19 (!) 166/71  04/22/19 (!) 160/80    Wt Readings from Last 3 Encounters:  07/24/19 182 lb (82.6 kg)  05/01/19 195 lb (88.5 kg)  04/22/19 194 lb (88 kg)    Physical Exam Constitutional:      General: She is not in acute distress.    Appearance: She is well-developed.  HENT:     Head: Normocephalic.     Right Ear: External ear normal.     Left Ear: External ear normal.  Nose: Nose normal.  Eyes:     General:        Right eye: No discharge.        Left eye: No discharge.     Conjunctiva/sclera: Conjunctivae normal.     Pupils: Pupils are equal, round, and reactive to light.  Neck:     Musculoskeletal: Normal range of motion and neck supple.     Thyroid: No thyromegaly.     Vascular: No JVD.     Trachea: No tracheal deviation.  Cardiovascular:     Rate and Rhythm: Normal rate and regular rhythm.     Heart sounds: Normal heart sounds.  Pulmonary:     Effort: No respiratory distress.     Breath sounds: No stridor. No wheezing.  Abdominal:     General: Bowel sounds are normal. There is no distension.     Palpations: Abdomen is  soft. There is no mass.     Tenderness: There is no abdominal tenderness. There is no guarding or rebound.  Musculoskeletal:        General: No tenderness.  Lymphadenopathy:     Cervical: No cervical adenopathy.  Skin:    Findings: No erythema or rash.  Neurological:     Mental Status: She is oriented to person, place, and time.     Cranial Nerves: No cranial nerve deficit.     Motor: No abnormal muscle tone.     Coordination: Coordination normal.     Deep Tendon Reflexes: Reflexes normal.  Psychiatric:        Behavior: Behavior normal.        Thought Content: Thought content normal.        Judgment: Judgment normal.   sensitive abdomen  Lab Results  Component Value Date   WBC 6.3 04/02/2018   HGB 11.2 (L) 04/02/2018   HCT 33.0 (L) 04/02/2018   PLT 193 04/02/2018   GLUCOSE 105 (H) 04/22/2019   CHOL 168 03/14/2018   TRIG 145.0 03/14/2018   HDL 46.70 03/14/2018   LDLCALC 93 03/14/2018   ALT 20 04/22/2019   AST 19 04/22/2019   NA 141 04/22/2019   K 3.6 04/22/2019   CL 103 04/22/2019   CREATININE 0.83 04/22/2019   BUN 15 04/22/2019   CO2 26 04/22/2019   TSH 1.50 07/23/2018   HGBA1C 6.1 04/22/2019    Ct Cardiac Scoring  Addendum Date: 06/11/2019   ADDENDUM REPORT: 06/11/2019 12:47 CLINICAL DATA:  Risk stratification EXAM: Coronary Calcium Score TECHNIQUE: The patient was scanned on a Siemens Somatom 64 slice scanner. Axial non-contrast 3 mm slices were carried out through the heart. The data set was analyzed on a dedicated work station and scored using the [place]. FINDINGS: Non-cardiac: See separate report from [facility]. Ascending aorta: Normal Pericardium: Normal Coronary arteries: Normal origin IMPRESSION: Coronary calcium score of 0. This was 0 percentile for age and sex matched control. Kirk Ruths Electronically Signed   By: Kirk Ruths M.D.   On: 06/11/2019 12:47   Result Date: 06/11/2019 EXAM: OVER-READ INTERPRETATION  CT CHEST The  following report is an over-read performed by radiologist Dr. Alvino Blood [facility] [place] on 06/11/2019. This over-read does not include interpretation of cardiac or coronary anatomy or pathology. The coronary calcium score interpretation by the cardiologist is attached. COMPARISON:  None FINDINGS: Limited view of the lung parenchyma demonstrates no suspicious nodularity. Airways are normal. Limited view of the mediastinum demonstrates no adenopathy. Esophagus normal. Limited view of the upper abdomen unremarkable.  Limited view of the skeleton and chest wall is unremarkable. IMPRESSION: No significant extracardiac findings. Electronically Signed: By: Suzy Bouchard M.D. On: 06/11/2019 11:43    Assessment & Plan:   There are no diagnoses linked to this encounter.   No orders of the defined types were placed in this encounter.    Follow-up: No follow-ups on file.  Walker Kehr, MD

## 2019-07-24 NOTE — Assessment & Plan Note (Signed)
On B12 

## 2019-07-24 NOTE — Assessment & Plan Note (Addendum)
C/o am diarrhea after she started chlorthalidone in Sept 2020 D/c Chlorthalidone

## 2019-07-24 NOTE — Assessment & Plan Note (Signed)
C/o am diarrhea after she started chlorthalidone in Sept 2020 Can't take amlodipine 10 mg - reduce to 5 mg D/c chlorthalidone - back on Inderal - use BID Lasix prn

## 2019-07-25 ENCOUNTER — Other Ambulatory Visit (INDEPENDENT_AMBULATORY_CARE_PROVIDER_SITE_OTHER): Payer: Medicare Other

## 2019-07-25 DIAGNOSIS — R7303 Prediabetes: Secondary | ICD-10-CM

## 2019-07-25 LAB — GLUCOSE, RANDOM: Glucose, Bld: 130 mg/dL — ABNORMAL HIGH (ref 70–99)

## 2019-07-25 LAB — HEMOGLOBIN A1C: Hgb A1c MFr Bld: 6.3 % (ref 4.6–6.5)

## 2019-07-25 NOTE — Addendum Note (Signed)
Addended by: Karren Cobble on: 07/25/2019 09:08 AM   Modules accepted: Orders

## 2019-07-29 ENCOUNTER — Other Ambulatory Visit: Payer: Self-pay

## 2019-07-29 ENCOUNTER — Ambulatory Visit (INDEPENDENT_AMBULATORY_CARE_PROVIDER_SITE_OTHER): Payer: Medicare Other | Admitting: Endocrinology

## 2019-07-29 ENCOUNTER — Encounter: Payer: Self-pay | Admitting: Endocrinology

## 2019-07-29 DIAGNOSIS — E1169 Type 2 diabetes mellitus with other specified complication: Secondary | ICD-10-CM | POA: Diagnosis not present

## 2019-07-29 DIAGNOSIS — E669 Obesity, unspecified: Secondary | ICD-10-CM | POA: Diagnosis not present

## 2019-07-29 MED ORDER — JARDIANCE 10 MG PO TABS: 10.0000 mg | ORAL_TABLET | Freq: Every day | ORAL | 3 refills | Status: DC

## 2019-07-29 NOTE — Progress Notes (Signed)
Patient ID: [name], female   DOB: Feb 19, 1946, 74 y.o.   MRN: [id number]           Today's office visit was provided via telemedicine using video technique The patient was explained the limitations of evaluation and management by telemedicine and the availability of in person appointments.  The patient understood the limitations and agreed to proceed. Patient also understood that the telehealth visit is billable. . Location of the patient: Patient's home . Location of the provider: Physician office Only the patient and myself were participating in the encounter     Reason for Appointment: Follow up of prediabetes  Primary care physician: [name]  History of Present Illness:          She has prediabetes and also was told to have background retinopathy  On her periodic general checkups she has had an A1c done regularly and these have been at baseline upper normal around 6.0  She was evaluated with a glucose tolerance test in 10/2015 which showed the following: Fasting glucose 122, one-hour glucose 202 hour glucose 147  She was referred to the dietitian for meal planning and has been seen twice  Recent history:  She is back for her 37-month follow-up  Her A1c recently has been ranging between 6.2-6.4 mostly and now 6.3  Fasting glucose is 130    She appears to have lost weight, probably another 4 pounds since her last visit  This may be mostly from cutting back on her portions and calories  She is not doing any formal exercise although trying to be generally more active  She still uses a [employer] monitor to check her blood sugar periodically  Does not keep any record but at times will check it after meals also  She thinks her fasting blood sugar in the morning is in the low 100 range  Today after breakfast her blood sugar was 230 but she only had toast and juice  She is again concerned about her blood pressure and other medications potentially causing her  blood sugar to be higher  However fasting blood sugars have not been above 125 consistently earlier this year  She says that at times either late morning or afternoon she may occasionally have a feeling of sinking relieved by juice but her sugars have not been low, occasionally she thinks her blood pressure may be relatively low    Weight history:  Wt Readings from Last 3 Encounters:  07/24/19 182 lb (82.6 kg)  05/01/19 195 lb (88.5 kg)  04/22/19 194 lb (88 kg)    Glycemic levels:   Lab Results  Component Value Date   HGBA1C 6.3 07/25/2019   HGBA1C 6.1 04/22/2019   HGBA1C 6.2 01/23/2019   Lab Results  Component Value Date   LDLCALC 93 03/14/2018   CREATININE 0.83 04/22/2019       Allergies as of 07/29/2019      Reactions   Actonel [risedronate Sodium]    Upset stomach   Boniva [ibandronate Sodium]    cramp   Calcium Channel Blockers    Upset stomach   Chlorthalidone    Diarrhea per pt   Compazine    Daughter reacts to compazine/pt does not want to take   Lyrica [pregabalin]    Dizzy   Tape    redness      Medication List       Accurate as of July 29, 2019 10:49 AM. If you have any questions, ask your nurse or doctor.  amLODipine 5 MG tablet Commonly known as: NORVASC TAKE 1 TABLET BY MOUTH EVERYDAY AT BEDTIME   aspirin 81 MG EC tablet Take 81 mg by mouth daily.   Cyanocobalamin 500 MCG/0.1ML Soln Commonly known as: Nascobal USE 1 SPRAY IN 1 NOSTRIL  ONCE A WEEK   denosumab 60 MG/ML Soln injection Commonly known as: PROLIA Inject 60 mg into the skin every 6 (six) months. Administer in upper arm, thigh, or abdomen   diphenoxylate-atropine 2.5-0.025 MG tablet Commonly known as: LOMOTIL TAKE 1 TABLET BY MOUTH FOUR TIMES A DAY AS NEEDED FOR DIARRHEA OR LOOSE STOOLS   furosemide 20 MG tablet Commonly known as: LASIX Take 0.5-1 tablets (10-20 mg total) by mouth daily as needed for edema (for leg swelling).   gabapentin 100 MG capsule  Commonly known as: NEURONTIN TAKE 1 CAPSULE BY MOUTH THREE TIMES A DAY   Lumigan 0.01 % Soln Generic drug: bimatoprost Place 1 drop into both eyes at bedtime.   propranolol ER 60 MG 24 hr capsule Commonly known as: INDERAL LA TAKE ONE CAPSULE BY MOUTH TWICE A DAY What changed:   how much to take  how to take this  when to take this   RABEprazole 20 MG tablet Commonly known as: ACIPHEX TAKE 1 TABLET BY MOUTH EVERY DAY   timolol 0.5 % ophthalmic gel-forming Commonly known as: TIMOPTIC-XR Place 1 drop into both eyes daily.   triamcinolone cream 0.1 % Commonly known as: KENALOG Apply 1 application topically 2 (two) times daily as needed (ear bacteria).   Vitamin D3 1.25 MG (50000 UT) Caps TAKE ONE CAPSULE BY MOUTH EVERY 30 DAYS       Allergies:  Allergies  Allergen Reactions  . Actonel [Risedronate Sodium]     Upset stomach  . Boniva [Ibandronate Sodium]     cramp  . Calcium Channel Blockers     Upset stomach  . Chlorthalidone     Diarrhea per pt  . Compazine     Daughter reacts to compazine/pt does not want to take  . Lyrica [Pregabalin]     Dizzy   . Tape     redness    Past Medical History:  Diagnosis Date  . Adenomatous colon polyp 04/08/2011  . Anemia   . Anxiety   . Cataract   . Essential hypertension 08/10/2007   Chronic  On Catapress (per Dr Ubaldo Glassing) - increase to bid - d/c Hydralazine prn per Dr Caryl Comes d/c 3/19: Increase Inderal LA to bid  . GERD (gastroesophageal reflux disease)   . Glaucoma (increased eye pressure)   . Heart murmur   . HTN (hypertension)   . IBS (irritable bowel syndrome)   . LBP (low back pain)   . Menopause   . Osteoporosis   . Ovarian cancer ([place]) 09/2008   Dr Gwyneth Revels  . Pancreatic cyst   . Vitamin B12 deficiency   . Vitamin D deficiency     Past Surgical History:  Procedure Laterality Date  . ABDOMINAL HYSTERECTOMY    . APPENDECTOMY    . CHOLECYSTECTOMY N/A 05/15/2017   Procedure: LAPAROSCOPIC CHOLECYSTECTOMY  WITH INTRAOPERATIVE CHOLANGIOGRAM AND LYSIS OF ADHESIONS;  Surgeon: Fanny Skates, MD;  Location: [facility];  Service: General;  Laterality: N/A;  . ELBOW FRACTURE SURGERY     age 13- left elbow  . [place]  2001  . Ovarian Cancer [place]  09/2008    Family History  Problem Relation Age of Onset  . Alzheimer's disease Mother 72  . Other  Mother 64       TAH for excessive bleeding  . Lung cancer Father        smoker; metastasis to stomach and other areas  . Heart attack Maternal Uncle   . Other Paternal Aunt        stomach issues  . Heart Problems Paternal Uncle   . Other Maternal Grandmother        stomach issues; +hysterectomy  . Heart attack Maternal Grandfather   . Infertility Daughter   . Stomach cancer Cousin        dx. mid-60s  . Other Cousin        stomach issues  . Leukemia Cousin 18  . Stomach cancer Other   . Cancer Cousin        unknown type  . Other Cousin        stomach issues  . Heart Problems Maternal Uncle   . Diabetes Paternal Aunt   . Heart Problems Paternal Uncle   . Emphysema Paternal Uncle        work exposure  . Breast cancer Cousin        dx. late 60s-early 70s  . Colon cancer Neg Hx   . Rectal cancer Neg Hx   . Esophageal cancer Neg Hx     Social History:  reports that she has never smoked. She has never used smokeless tobacco. She reports that she does not drink alcohol or use drugs.    Review of Systems     Lipid history: She has not been on any statin drugs, lipids to be followed up with PCP    Lab Results  Component Value Date   CHOL 168 03/14/2018   HDL 46.70 03/14/2018   LDLCALC 93 03/14/2018   TRIG 145.0 03/14/2018   CHOLHDL 4 03/14/2018         RETINOPATHY: She is followed by ophthalmologist for background retinopathy  Hypertension followed by PCP She is also going to the nephrologist for hypertension Recently amlodipine reduced and Inderal increased, previously also tried on chlorthalidone  BP Readings from  Last 3 Encounters:  07/24/19 130/80  05/01/19 (!) 166/71  04/22/19 (!) 160/80   Lab Results  Component Value Date   CREATININE 0.83 04/22/2019   BUN 15 04/22/2019   NA 141 04/22/2019   K 3.6 04/22/2019   CL 103 04/22/2019   CO2 26 04/22/2019     Review of Systems   LABS:  Lab on 07/25/2019  Component Date Value Ref Range Status  . Glucose, Bld 07/25/2019 130* 70 - 99 mg/dL Final  . Hgb A1c MFr Bld 07/25/2019 6.3  4.6 - 6.5 % Final   Glycemic Control Guidelines for People with Diabetes:Non Diabetic:  <6%Goal of Therapy: <7%Additional Action Suggested:  >8%     Physical Examination:  There were no vitals taken for this visit.        ASSESSMENT/PLAN:  PREDIABETES with obesity:  Highest blood sugar was 126 fasting previously but now 130 This shows an increase from usual levels  Her A1c has been in the prediabetic range, and about the same at 6.3  Even though she has lost weight her fasting glucose is relatively higher She also has glucose intolerance and home blood sugar as high as 230 after significant carbohydrates this morning at breakfast Although she has some nonspecific symptoms suggestive of reactive hypoglycemia she has not had any documented low sugars at those times  She is a good candidate for Metformin but she is  reluctant to consider this because of her chronic diarrhea  Considering her history of hypertension, difficulty with weight loss she is a good candidate for SGLT2 drug Discussed action of SGLT 2 drugs on lowering glucose by decreasing kidney absorption of glucose, benefits of weight loss and lower blood pressure, possible side effects including candidiasis and dosage regimen   She will start with 10 mg of Jardiance daily Discussed that with this she should not need to take any Lasix Also may consider reducing her amlodipine if blood pressure is below [id number] systolic She will need to monitor her blood pressure regularly To follow-up in 2 months   Continue follow-up with PCP and nephrologist for other problems  She does need follow-up lipids also on the next visit     Elayne Snare 07/29/2019, 10:49 AM   Note: This office note was prepared with Dragon voice recognition system technology. Any transcriptional errors that result from this process are unintentional.

## 2019-07-30 ENCOUNTER — Other Ambulatory Visit: Payer: Self-pay

## 2019-07-30 ENCOUNTER — Telehealth: Payer: Self-pay | Admitting: Endocrinology

## 2019-07-30 MED ORDER — METFORMIN HCL ER 500 MG PO TB24: ORAL_TABLET | ORAL | 1 refills | Status: DC

## 2019-07-30 NOTE — Telephone Encounter (Signed)
Rx has been sent  

## 2019-07-30 NOTE — Telephone Encounter (Signed)
Requirement is that the pt try and fail Metformin containing products first. Please advise.

## 2019-07-30 NOTE — Telephone Encounter (Signed)
Patient called to advise that [name] is not a covered medication on her insurance plan. Patient states that Dr Dwyane Dee mentioned her taking Metformin or Jardiance.  Patient is OK with trying the Metformin if that is still an option.  Any questions, concerns, or clarification - [phone]

## 2019-07-30 NOTE — Telephone Encounter (Signed)
She will be needing a prescription for Metformin ER 500 mg.  To take 1 tablet daily for 7 days and then twice a day if tolerated

## 2019-07-30 NOTE — Telephone Encounter (Signed)
Called pt and gave her this information. She verbalized understanding.

## 2019-08-13 DIAGNOSIS — M79671 Pain in right foot: Secondary | ICD-10-CM | POA: Diagnosis not present

## 2019-08-13 DIAGNOSIS — M19079 Primary osteoarthritis, unspecified ankle and foot: Secondary | ICD-10-CM | POA: Diagnosis not present

## 2019-08-21 ENCOUNTER — Other Ambulatory Visit: Payer: Self-pay | Admitting: Endocrinology

## 2019-09-04 ENCOUNTER — Telehealth: Payer: Self-pay

## 2019-09-04 NOTE — Telephone Encounter (Signed)
Called pt and gave her MD message. PT verbalized understanding. 

## 2019-09-04 NOTE — Telephone Encounter (Signed)
Routing to Dr. Kumar

## 2019-09-04 NOTE — Telephone Encounter (Signed)
Patient called in wanting to know if her met forming she taking is related to her foot problems she's been having      Please call and advise

## 2019-09-04 NOTE — Telephone Encounter (Signed)
No

## 2019-09-23 ENCOUNTER — Other Ambulatory Visit: Payer: Self-pay

## 2019-09-23 ENCOUNTER — Other Ambulatory Visit (INDEPENDENT_AMBULATORY_CARE_PROVIDER_SITE_OTHER): Payer: Medicare Other

## 2019-09-23 DIAGNOSIS — E669 Obesity, unspecified: Secondary | ICD-10-CM | POA: Diagnosis not present

## 2019-09-23 DIAGNOSIS — E1169 Type 2 diabetes mellitus with other specified complication: Secondary | ICD-10-CM

## 2019-09-23 LAB — BASIC METABOLIC PANEL
BUN: 20 mg/dL (ref 6–23)
CO2: 28 mEq/L (ref 19–32)
Calcium: 9.3 mg/dL (ref 8.4–10.5)
Chloride: 102 mEq/L (ref 96–112)
Creatinine, Ser: 1.08 mg/dL (ref 0.40–1.20)
GFR: 49.62 mL/min — ABNORMAL LOW (ref >60.00)
Glucose, Bld: 100 mg/dL — ABNORMAL HIGH (ref 70–99)
Potassium: 3 mEq/L — ABNORMAL LOW (ref 3.5–5.1)
Sodium: 142 mEq/L (ref 135–145)

## 2019-09-24 LAB — FRUCTOSAMINE: Fructosamine: 257 umol/L (ref 0–285)

## 2019-09-28 ENCOUNTER — Other Ambulatory Visit: Payer: Self-pay | Admitting: Internal Medicine

## 2019-09-28 MED ORDER — POTASSIUM CHLORIDE CRYS ER 20 MEQ PO TBCR: 20.0000 meq | EXTENDED_RELEASE_TABLET | Freq: Every day | ORAL | 3 refills | Status: DC

## 2019-09-29 ENCOUNTER — Other Ambulatory Visit: Payer: Self-pay | Admitting: Internal Medicine

## 2019-10-01 ENCOUNTER — Encounter: Payer: Self-pay | Admitting: Endocrinology

## 2019-10-01 ENCOUNTER — Ambulatory Visit (INDEPENDENT_AMBULATORY_CARE_PROVIDER_SITE_OTHER): Payer: Medicare Other | Admitting: Endocrinology

## 2019-10-01 ENCOUNTER — Other Ambulatory Visit: Payer: Self-pay

## 2019-10-01 DIAGNOSIS — E1169 Type 2 diabetes mellitus with other specified complication: Secondary | ICD-10-CM

## 2019-10-01 DIAGNOSIS — I10 Essential (primary) hypertension: Secondary | ICD-10-CM | POA: Diagnosis not present

## 2019-10-01 DIAGNOSIS — E876 Hypokalemia: Secondary | ICD-10-CM

## 2019-10-01 DIAGNOSIS — E669 Obesity, unspecified: Secondary | ICD-10-CM

## 2019-10-01 MED ORDER — JARDIANCE 10 MG PO TABS: 10.0000 mg | ORAL_TABLET | Freq: Every day | ORAL | 3 refills | Status: DC

## 2019-10-01 NOTE — Progress Notes (Signed)
Patient ID: [name], female   DOB: [birth year], 74 y.o.   MRN: [id number]           Today's office visit was provided via telemedicine using a telephone call to the patient Patient has been explained the limitations of evaluation and management by telemedicine and the availability of in person appointments.  The patient understood the limitations and agreed to proceed. Patient also understood that the telehealth visit is billable. . Location of the patient: Home . Location of the provider: Office Only the patient and myself were participating in the encounter    Reason for Appointment: Follow up of prediabetes  Primary care physician: [name]  History of Present Illness:          She has prediabetes and also was told to have background retinopathy  On her periodic general checkups she has had an A1c done regularly and these have been at baseline upper normal around 6.0  She was evaluated with a glucose tolerance test in 10/2015 which showed the following: Fasting glucose 122, one-hour glucose 202 hour glucose 147  She was referred to the dietitian for meal planning and has been seen twice  Recent history:  Her A1c recently has been ranging between 6.2-6.4 mostly and last 6.3    She had a fasting blood sugar 130 and postprandial of 230 on her last visit  She was recommended Jardiance but this was denied by her insurance  She was recommended Metformin ER which she has taken but only 1 tablet daily  She has some difficulty with diarrhea already and this may make it somewhat worse, she is reluctant to try a higher dose because of continued significant diarrhea problems  She thinks she has been able to otherwise lose some more weight and is down to close to 170 pounds  She still uses a [employer] monitor to check her blood sugar periodically  Does not keep any record and no readings were available for review  She thinks her fasting blood sugar in the morning is  variable and ranging from about 101 up to 133 and recently 128  Readings after meals are not usually high and consistently below 180  As before she does not exercise  She thinks she is generally watching her portions and carbohydrates and usually not having too many snacks also   Weight history:  Wt Readings from Last 3 Encounters:  07/24/19 182 lb (82.6 kg)  05/01/19 195 lb (88.5 kg)  04/22/19 194 lb (88 kg)    Glycemic levels:   Lab Results  Component Value Date   HGBA1C 6.3 07/25/2019   HGBA1C 6.1 04/22/2019   HGBA1C 6.2 01/23/2019   Lab Results  Component Value Date   LDLCALC 93 03/14/2018   CREATININE 1.08 09/23/2019    Other problems: See review of systems   Allergies as of 10/01/2019      Reactions   Actonel [risedronate Sodium]    Upset stomach   Boniva [ibandronate Sodium]    cramp   Calcium Channel Blockers    Upset stomach   Chlorthalidone    Diarrhea per pt   Compazine    Daughter reacts to compazine/pt does not want to take   Lyrica [pregabalin]    Dizzy   Tape    redness      Medication List       Accurate as of October 01, 2019  9:20 PM. If you have any questions, ask your nurse or doctor.  amLODipine 5 MG tablet Commonly known as: NORVASC TAKE 1 TABLET BY MOUTH EVERYDAY AT BEDTIME   aspirin 81 MG EC tablet Take 81 mg by mouth daily.   Cyanocobalamin 500 MCG/0.1ML Soln Commonly known as: Nascobal USE 1 SPRAY IN 1 NOSTRIL  ONCE A WEEK   denosumab 60 MG/ML Soln injection Commonly known as: PROLIA Inject 60 mg into the skin every 6 (six) months. Administer in upper arm, thigh, or abdomen   diphenoxylate-atropine 2.5-0.025 MG tablet Commonly known as: LOMOTIL TAKE 1 TABLET BY MOUTH FOUR TIMES A DAY AS NEEDED FOR DIARRHEA OR LOOSE STOOLS   furosemide 20 MG tablet Commonly known as: LASIX Take 0.5-1 tablets (10-20 mg total) by mouth daily as needed for edema (for leg swelling).   gabapentin 100 MG capsule Commonly known  as: NEURONTIN TAKE 1 CAPSULE BY MOUTH THREE TIMES A DAY   Lumigan 0.01 % Soln Generic drug: bimatoprost Place 1 drop into both eyes at bedtime.   metFORMIN 500 MG 24 hr tablet Commonly known as: GLUCOPHAGE-XR TAKE 1 TABLET BY MOUTH ONCE DAILY FOR 7 DAYS AND INCREASE TO 2 TABS DAILY IF TOLERATED.   potassium chloride SA 20 MEQ tablet Commonly known as: KLOR-CON Take 1 tablet (20 mEq total) by mouth daily.   propranolol ER 60 MG 24 hr capsule Commonly known as: INDERAL LA TAKE ONE CAPSULE BY MOUTH TWICE A DAY What changed:   how much to take  how to take this  when to take this   RABEprazole 20 MG tablet Commonly known as: ACIPHEX TAKE 1 TABLET BY MOUTH EVERY DAY   timolol 0.5 % ophthalmic gel-forming Commonly known as: TIMOPTIC-XR Place 1 drop into both eyes daily.   triamcinolone cream 0.1 % Commonly known as: KENALOG Apply 1 application topically 2 (two) times daily as needed (ear bacteria).   Vitamin D3 1.25 MG (50000 UT) Caps TAKE ONE CAPSULE BY MOUTH EVERY 30 DAYS       Allergies:  Allergies  Allergen Reactions  . Actonel [Risedronate Sodium]     Upset stomach  . Boniva [Ibandronate Sodium]     cramp  . Calcium Channel Blockers     Upset stomach  . Chlorthalidone     Diarrhea per pt  . Compazine     Daughter reacts to compazine/pt does not want to take  . Lyrica [Pregabalin]     Dizzy   . Tape     redness    Past Medical History:  Diagnosis Date  . Adenomatous colon polyp 04/08/2011  . Anemia   . Anxiety   . Cataract   . Essential hypertension 08/10/2007   Chronic  On Catapress (per Dr Ubaldo Glassing) - increase to bid - d/c Hydralazine prn per Dr Caryl Comes d/c 3/19: Increase Inderal LA to bid  . GERD (gastroesophageal reflux disease)   . Glaucoma (increased eye pressure)   . Heart murmur   . HTN (hypertension)   . IBS (irritable bowel syndrome)   . LBP (low back pain)   . Menopause   . Osteoporosis   . Ovarian cancer ([place]) 09/2008   Dr Gwyneth Revels   . Pancreatic cyst   . Vitamin B12 deficiency   . Vitamin D deficiency     Past Surgical History:  Procedure Laterality Date  . ABDOMINAL HYSTERECTOMY    . APPENDECTOMY    . CHOLECYSTECTOMY N/A 05/15/2017   Procedure: LAPAROSCOPIC CHOLECYSTECTOMY WITH INTRAOPERATIVE CHOLANGIOGRAM AND LYSIS OF ADHESIONS;  Surgeon: Fanny Skates, MD;  Location: [facility];  Service: General;  Laterality: N/A;  . ELBOW FRACTURE SURGERY     age 73- left elbow  . [facility]  2001  . Ovarian Cancer [place]  09/2008    Family History  Problem Relation Age of Onset  . Alzheimer's disease Mother 37  . Other Mother 67       TAH for excessive bleeding  . Lung cancer Father        smoker; metastasis to stomach and other areas  . Heart attack Maternal Uncle   . Other Paternal Aunt        stomach issues  . Heart Problems Paternal Uncle   . Other Maternal Grandmother        stomach issues; +hysterectomy  . Heart attack Maternal Grandfather   . Infertility Daughter   . Stomach cancer Cousin        dx. mid-60s  . Other Cousin        stomach issues  . Leukemia Cousin 18  . Stomach cancer Other   . Cancer Cousin        unknown type  . Other Cousin        stomach issues  . Heart Problems Maternal Uncle   . Diabetes Paternal Aunt   . Heart Problems Paternal Uncle   . Emphysema Paternal Uncle        work exposure  . Breast cancer Cousin        dx. late 60s-early 70s  . Colon cancer Neg Hx   . Rectal cancer Neg Hx   . Esophageal cancer Neg Hx     Social History:  reports that she has never smoked. She has never used smokeless tobacco. She reports that she does not drink alcohol or use drugs.    Review of Systems     Lipid history: She has not been on any statin drugs, lipids to be followed up with PCP    Lab Results  Component Value Date   CHOL 168 03/14/2018   HDL 46.70 03/14/2018   LDLCALC 93 03/14/2018   TRIG 145.0 03/14/2018   CHOLHDL 4 03/14/2018         RETINOPATHY: She is  followed by ophthalmologist for background retinopathy  Hypertension followed by PCP and nephrologist  She is on amlodipine and Inderal However she thinks her blood pressure recently was 158/67 and frequently high systolic, has not discussed with PCP  BP Readings from Last 3 Encounters:  07/24/19 130/80  05/01/19 (!) 166/71  04/22/19 (!) 160/80   HYPOKALEMIA: This has happened periodically without diuretics, she says this is likely to be from her diarrhea Recently started on potassium supplements  Lab Results  Component Value Date   CREATININE 1.08 09/23/2019   BUN 20 09/23/2019   NA 142 09/23/2019   K 3.0 (L) 09/23/2019   CL 102 09/23/2019   CO2 28 09/23/2019     Review of Systems   LABS:  No visits with results within 1 Week(s) from this visit.  Latest known visit with results is:  Lab on 09/23/2019  Component Date Value Ref Range Status  . Sodium 09/23/2019 142  135 - 145 mEq/L Final  . Potassium 09/23/2019 3.0* 3.5 - 5.1 mEq/L Final  . Chloride 09/23/2019 102  96 - 112 mEq/L Final  . CO2 09/23/2019 28  19 - 32 mEq/L Final  . Glucose, Bld 09/23/2019 100* 70 - 99 mg/dL Final  . BUN 09/23/2019 20  6 - 23 mg/dL Final  . Creatinine, Ser  09/23/2019 1.08  0.40 - 1.20 mg/dL Final  . GFR 09/23/2019 49.62* >60.00 mL/min Final  . Calcium 09/23/2019 9.3  8.4 - 10.5 mg/dL Final  . Fructosamine 09/23/2019 257  0 - 285 umol/L Final   Comment: Published reference interval for apparently healthy subjects between age 56 and 23 is 59 - 285 umol/L and in a poorly controlled diabetic population is 228 - 563 umol/L with a mean of 396 umol/L.     Physical Examination:  There were no vitals taken for this visit.        ASSESSMENT/PLAN:  PREDIABETES with obesity and history of mild retinopathy:  Last A1c was 6.3  She did not have a fasting glucose on this visit but has had readings as high as 133 at home in the morning She can only tolerate 500 mg of Metformin ER because  of diarrhea Otherwise she has done well with improving diet and losing weight  Since her fasting readings are not consistently improving at home and she needs overall better control both of her weight, blood pressure and diabetes she is a good candidate for SGLT2 drug Discussed again the benefits of this and possible side effects  She will start with 10 mg of Jardiance daily and stop Metformin at this time We should be able to get this approved through her insurance now since she is intolerant to Metformin Recommended that she bring her monitor for review on the next visit and check either fasting or 2-hour postprandial glucose  Discussed that with this she should not need to take any Lasix and his edema would be better control  She will continue to monitor blood pressure at home regularly Labs will include fasting glucose on the next visit  To follow-up in 2 months  Continue follow-up with PCP and nephrologist for other problems including assessment of lipids and follow-up of hypokalemia  Duration of telephone encounter = 12 minutes     Elayne Snare 10/01/2019, 9:20 PM   Note: This office note was prepared with Dragon voice recognition system technology. Any transcriptional errors that result from this process are unintentional.

## 2019-10-02 ENCOUNTER — Ambulatory Visit: Payer: Medicare Other

## 2019-10-11 ENCOUNTER — Ambulatory Visit: Payer: Medicare Other | Attending: Internal Medicine

## 2019-10-11 DIAGNOSIS — Z23 Encounter for immunization: Secondary | ICD-10-CM

## 2019-10-11 NOTE — Progress Notes (Signed)
   Covid-19 Vaccination Clinic  Name:  [name]    MRN: [id number] DOB: [birth year]  10/11/2019  [name] was observed post Covid-19 immunization for 15 minutes without incidence. She was provided with Vaccine Information Sheet and instruction to access the V-Safe system.   [name] was instructed to call 911 with any severe reactions post vaccine: [name] Difficulty breathing  . Swelling of your face and throat  . A fast heartbeat  . A bad rash all over your body  . Dizziness and weakness    Immunizations Administered    Name Date Dose VIS Date Route   Pfizer COVID-19 Vaccine 10/11/2019  8:53 AM 0.3 mL 08/16/2019 Intramuscular   Manufacturer: [place]   Lot: [id number]   [place]: [id number]

## 2019-10-12 ENCOUNTER — Other Ambulatory Visit: Payer: Self-pay | Admitting: Internal Medicine

## 2019-10-23 ENCOUNTER — Ambulatory Visit: Payer: Medicare Other

## 2019-10-24 ENCOUNTER — Ambulatory Visit: Payer: Medicare Other | Admitting: Internal Medicine

## 2019-11-05 ENCOUNTER — Ambulatory Visit: Payer: Medicare Other | Attending: Internal Medicine

## 2019-11-05 DIAGNOSIS — Z23 Encounter for immunization: Secondary | ICD-10-CM | POA: Insufficient documentation

## 2019-11-05 NOTE — Progress Notes (Signed)
   Covid-19 Vaccination Clinic  Name:  [name]    MRN: [id number] DOB: 26-Apr-1946  11/05/2019  [name] was observed post Covid-19 immunization for 15 minutes without incident. She was provided with Vaccine Information Sheet and instruction to access the V-Safe system.   [name] was instructed to call 911 with any severe reactions post vaccine: [name] Difficulty breathing  . Swelling of face and throat  . A fast heartbeat  . A bad rash all over body  . Dizziness and weakness   Immunizations Administered    Name Date Dose VIS Date Route   Pfizer COVID-19 Vaccine 11/05/2019  9:31 AM 0.3 mL 08/16/2019 Intramuscular   Manufacturer: [place]   Lot: [id number]   [place]: [id number]

## 2019-11-07 ENCOUNTER — Other Ambulatory Visit: Payer: Self-pay

## 2019-11-07 ENCOUNTER — Ambulatory Visit (INDEPENDENT_AMBULATORY_CARE_PROVIDER_SITE_OTHER): Payer: Medicare Other | Admitting: Internal Medicine

## 2019-11-07 ENCOUNTER — Encounter: Payer: Self-pay | Admitting: Internal Medicine

## 2019-11-07 DIAGNOSIS — E876 Hypokalemia: Secondary | ICD-10-CM

## 2019-11-07 DIAGNOSIS — R1013 Epigastric pain: Secondary | ICD-10-CM | POA: Diagnosis not present

## 2019-11-07 DIAGNOSIS — G8929 Other chronic pain: Secondary | ICD-10-CM

## 2019-11-07 DIAGNOSIS — E559 Vitamin D deficiency, unspecified: Secondary | ICD-10-CM

## 2019-11-07 DIAGNOSIS — I10 Essential (primary) hypertension: Secondary | ICD-10-CM | POA: Diagnosis not present

## 2019-11-07 DIAGNOSIS — E538 Deficiency of other specified B group vitamins: Secondary | ICD-10-CM

## 2019-11-07 DIAGNOSIS — M79671 Pain in right foot: Secondary | ICD-10-CM

## 2019-11-07 MED ORDER — CHARCOAL ACTIVATED 260 MG PO CAPS: ORAL_CAPSULE | ORAL | 5 refills | Status: DC

## 2019-11-07 MED ORDER — IPRATROPIUM BROMIDE 0.06 % NA SOLN: 2.0000 | Freq: Three times a day (TID) | NASAL | 2 refills | Status: DC

## 2019-11-07 NOTE — Assessment & Plan Note (Addendum)
?  etiol F/u w/Ortho Turmeric tablets for arthritis

## 2019-11-07 NOTE — Patient Instructions (Signed)
Turmeric tablets for arthritis

## 2019-11-07 NOTE — Assessment & Plan Note (Signed)
Labs

## 2019-11-07 NOTE — Progress Notes (Signed)
Subjective:  Patient ID: [name], female    DOB: Jun 15, 1946  Age: 74 y.o. MRN: [id number]  CC: No chief complaint on file.   HPI GEENA NORRICK presents for HTN, DM, OA, IBS-D f/u Pt lost wt C/o recurrent R foot pain  Outpatient Medications Prior to Visit  Medication Sig Dispense Refill  . amLODipine (NORVASC) 5 MG tablet TAKE 1 TABLET BY MOUTH EVERYDAY AT BEDTIME  2  . aspirin 81 MG EC tablet Take 81 mg by mouth daily.      . bimatoprost (LUMIGAN) 0.01 % SOLN Place 1 drop into both eyes at bedtime.    . Cholecalciferol (VITAMIN D3) 1.25 MG (50000 UT) CAPS TAKE ONE CAPSULE BY MOUTH EVERY 30 DAYS 9 capsule 3  . Cyanocobalamin (NASCOBAL) 500 MCG/0.1ML SOLN USE 1 SPRAY IN 1 NOSTRIL  ONCE A WEEK 3 Bottle 3  . denosumab (PROLIA) 60 MG/ML SOLN injection Inject 60 mg into the skin every 6 (six) months. Administer in upper arm, thigh, or abdomen    . diphenoxylate-atropine (LOMOTIL) 2.5-0.025 MG tablet TAKE 1 TABLET BY MOUTH FOUR TIMES A DAY AS NEEDED FOR DIARRHEA OR LOOSE STOOLS 60 tablet 3  . empagliflozin (JARDIANCE) 10 MG TABS tablet Take 10 mg by mouth daily with breakfast. 30 tablet 3  . furosemide (LASIX) 20 MG tablet Take 0.5-1 tablets (10-20 mg total) by mouth daily as needed for edema (for leg swelling). 90 tablet 1  . potassium chloride SA (KLOR-CON) 20 MEQ tablet Take 1 tablet (20 mEq total) by mouth daily. 90 tablet 3  . propranolol ER (INDERAL LA) 60 MG 24 hr capsule TAKE ONE CAPSULE BY MOUTH TWICE A DAY (Patient taking differently: daily. ) 180 capsule 3  . RABEprazole (ACIPHEX) 20 MG tablet TAKE 1 TABLET BY MOUTH EVERY DAY 90 tablet 1  . timolol (TIMOPTIC-XR) 0.5 % ophthalmic gel-forming Place 1 drop into both eyes daily.     [name] triamcinolone cream (KENALOG) 0.1 % Apply 1 application topically 2 (two) times daily as needed (ear bacteria).     . gabapentin (NEURONTIN) 100 MG capsule TAKE 1 CAPSULE BY MOUTH THREE TIMES A DAY (Patient not taking: Reported on 11/07/2019) 270  capsule 1  . metFORMIN (GLUCOPHAGE-XR) 500 MG 24 hr tablet TAKE 1 TABLET BY MOUTH ONCE DAILY FOR 7 DAYS AND INCREASE TO 2 TABS DAILY IF TOLERATED. (Patient not taking: Reported on 11/07/2019) 60 tablet 1   No facility-administered medications prior to visit.    ROS: Review of Systems  Constitutional: Positive for unexpected weight change. Negative for activity change, appetite change, chills and fatigue.  HENT: Negative for congestion, mouth sores and sinus pressure.   Eyes: Negative for visual disturbance.  Respiratory: Negative for cough and chest tightness.   Gastrointestinal: Negative for abdominal pain and nausea.  Genitourinary: Negative for difficulty urinating, frequency and vaginal pain.  Musculoskeletal: Positive for arthralgias. Negative for back pain and gait problem.  Skin: Negative for pallor and rash.  Neurological: Negative for dizziness, tremors, weakness, numbness and headaches.  Psychiatric/Behavioral: Negative for confusion and sleep disturbance.    Objective:  BP 130/76 (BP Location: Left Arm, Patient Position: Sitting, Cuff Size: Normal)   Pulse (!) 54   Temp 98 F (36.7 C) (Oral)   Ht 5\' 4"  (1.626 m)   Wt 167 lb (75.8 kg)   SpO2 97%   BMI 28.67 kg/m   BP Readings from Last 3 Encounters:  11/07/19 130/76  07/24/19 130/80  05/01/19 (!) 166/71  Wt Readings from Last 3 Encounters:  11/07/19 167 lb (75.8 kg)  07/24/19 182 lb (82.6 kg)  05/01/19 195 lb (88.5 kg)    Physical Exam Constitutional:      General: She is not in acute distress.    Appearance: She is well-developed. She is obese.  HENT:     Head: Normocephalic.     Right Ear: External ear normal.     Left Ear: External ear normal.     Nose: Nose normal.  Eyes:     General:        Right eye: No discharge.        Left eye: No discharge.     Conjunctiva/sclera: Conjunctivae normal.     Pupils: Pupils are equal, round, and reactive to light.  Neck:     Thyroid: No thyromegaly.      Vascular: No JVD.     Trachea: No tracheal deviation.  Cardiovascular:     Rate and Rhythm: Normal rate and regular rhythm.     Heart sounds: Normal heart sounds.  Pulmonary:     Effort: No respiratory distress.     Breath sounds: No stridor. No wheezing.  Abdominal:     General: Bowel sounds are normal. There is no distension.     Palpations: Abdomen is soft. There is no mass.     Tenderness: There is no abdominal tenderness. There is no guarding or rebound.  Musculoskeletal:        General: No tenderness.     Cervical back: Normal range of motion and neck supple.  Lymphadenopathy:     Cervical: No cervical adenopathy.  Skin:    Findings: No erythema or rash.  Neurological:     Mental Status: She is oriented to person, place, and time.     Cranial Nerves: No cranial nerve deficit.     Motor: No abnormal muscle tone.     Coordination: Coordination normal.     Deep Tendon Reflexes: Reflexes normal.  Psychiatric:        Behavior: Behavior normal.        Thought Content: Thought content normal.        Judgment: Judgment normal.     Lab Results  Component Value Date   WBC 6.3 04/02/2018   HGB 11.2 (L) 04/02/2018   HCT 33.0 (L) 04/02/2018   PLT 193 04/02/2018   GLUCOSE 100 (H) 09/23/2019   CHOL 168 03/14/2018   TRIG 145.0 03/14/2018   HDL 46.70 03/14/2018   LDLCALC 93 03/14/2018   ALT 20 04/22/2019   AST 19 04/22/2019   NA 142 09/23/2019   K 3.0 (L) 09/23/2019   CL 102 09/23/2019   CREATININE 1.08 09/23/2019   BUN 20 09/23/2019   CO2 28 09/23/2019   TSH 1.50 07/23/2018   HGBA1C 6.3 07/25/2019    CT CARDIAC SCORING  Addendum Date: 06/11/2019   ADDENDUM REPORT: 06/11/2019 12:47 CLINICAL DATA:  Risk stratification EXAM: Coronary Calcium Score TECHNIQUE: The patient was scanned on a Siemens Somatom 64 slice scanner. Axial non-contrast 3 mm slices were carried out through the heart. The data set was analyzed on a dedicated work station and scored using the [place]. FINDINGS: Non-cardiac: See separate report from [facility]. Ascending aorta: Normal Pericardium: Normal Coronary arteries: Normal origin IMPRESSION: Coronary calcium score of 0. This was 0 percentile for age and sex matched control. Kirk Ruths Electronically Signed   By: Kirk Ruths M.D.   On: 06/11/2019 12:47  Result Date: 06/11/2019 EXAM: OVER-READ INTERPRETATION  CT CHEST The following report is an over-read performed by radiologist Dr. Alvino Blood [facility], [place] on 06/11/2019. This over-read does not include interpretation of cardiac or coronary anatomy or pathology. The coronary calcium score interpretation by the cardiologist is attached. COMPARISON:  None FINDINGS: Limited view of the lung parenchyma demonstrates no suspicious nodularity. Airways are normal. Limited view of the mediastinum demonstrates no adenopathy. Esophagus normal. Limited view of the upper abdomen unremarkable. Limited view of the skeleton and chest wall is unremarkable. IMPRESSION: No significant extracardiac findings. Electronically Signed: By: Suzy Bouchard M.D. On: 06/11/2019 11:43    Assessment & Plan:   There are no diagnoses linked to this encounter.   Meds ordered this encounter  Medications  . ipratropium (ATROVENT) 0.06 % nasal spray    Sig: Place 2 sprays into the nose 3 (three) times daily.    Dispense:  15 mL    Refill:  2     Follow-up: No follow-ups on file.  Walker Kehr, MD

## 2019-11-07 NOTE — Assessment & Plan Note (Signed)
Try activated charcoal

## 2019-11-07 NOTE — Assessment & Plan Note (Signed)
On Nascobal

## 2019-11-07 NOTE — Assessment & Plan Note (Signed)
Vit d 

## 2019-11-07 NOTE — Assessment & Plan Note (Signed)
BP Readings from Last 3 Encounters:  11/07/19 130/76  07/24/19 130/80  05/01/19 (!) 166/71

## 2019-11-13 ENCOUNTER — Telehealth: Payer: Self-pay | Admitting: Internal Medicine

## 2019-11-13 NOTE — Telephone Encounter (Signed)
Insurance has been submitted and verified for Prolia. Patient is responsible for a $260 copay. Due anytime.  Left message for patient to call back to schedule.   Okay to schedule... Visit Note: Prolia ($260 copay - okay to give per Gareth Eagle) Visit Type: Nurse Provider: Nurse

## 2019-11-14 NOTE — Telephone Encounter (Signed)
Appointment scheduled.

## 2019-11-20 DIAGNOSIS — H40013 Open angle with borderline findings, low risk, bilateral: Secondary | ICD-10-CM | POA: Diagnosis not present

## 2019-11-20 DIAGNOSIS — H43813 Vitreous degeneration, bilateral: Secondary | ICD-10-CM | POA: Diagnosis not present

## 2019-11-20 DIAGNOSIS — E119 Type 2 diabetes mellitus without complications: Secondary | ICD-10-CM | POA: Diagnosis not present

## 2019-11-20 DIAGNOSIS — H524 Presbyopia: Secondary | ICD-10-CM | POA: Diagnosis not present

## 2019-11-20 DIAGNOSIS — H2513 Age-related nuclear cataract, bilateral: Secondary | ICD-10-CM | POA: Diagnosis not present

## 2019-11-20 LAB — HM DIABETES EYE EXAM

## 2019-11-26 ENCOUNTER — Other Ambulatory Visit: Payer: Self-pay

## 2019-11-26 ENCOUNTER — Other Ambulatory Visit: Payer: Self-pay | Admitting: Endocrinology

## 2019-11-26 ENCOUNTER — Other Ambulatory Visit (INDEPENDENT_AMBULATORY_CARE_PROVIDER_SITE_OTHER): Payer: Medicare Other

## 2019-11-26 DIAGNOSIS — E1169 Type 2 diabetes mellitus with other specified complication: Secondary | ICD-10-CM | POA: Diagnosis not present

## 2019-11-26 DIAGNOSIS — E669 Obesity, unspecified: Secondary | ICD-10-CM

## 2019-11-26 LAB — COMPREHENSIVE METABOLIC PANEL
ALT: 14 U/L (ref 0–35)
AST: 16 U/L (ref 0–37)
Albumin: 4.2 g/dL (ref 3.5–5.2)
Alkaline Phosphatase: 57 U/L (ref 39–117)
BUN: 24 mg/dL — ABNORMAL HIGH (ref 6–23)
CO2: 28 mEq/L (ref 19–32)
Calcium: 9.5 mg/dL (ref 8.4–10.5)
Chloride: 107 mEq/L (ref 96–112)
Creatinine, Ser: 1.25 mg/dL — ABNORMAL HIGH (ref 0.40–1.20)
GFR: 41.9 mL/min — ABNORMAL LOW (ref >60.00)
Glucose, Bld: 109 mg/dL — ABNORMAL HIGH (ref 70–99)
Potassium: 3.3 mEq/L — ABNORMAL LOW (ref 3.5–5.1)
Sodium: 141 mEq/L (ref 135–145)
Total Bilirubin: 0.6 mg/dL (ref 0.2–1.2)
Total Protein: 7.3 g/dL (ref 6.0–8.3)

## 2019-11-26 LAB — LIPID PANEL
Cholesterol: 169 mg/dL (ref 0–200)
HDL: 41.9 mg/dL (ref >39.00)
LDL Cholesterol: 100 mg/dL — ABNORMAL HIGH (ref 0–99)
NonHDL: 126.7
Total CHOL/HDL Ratio: 4
Triglycerides: 136 mg/dL (ref 0.0–149.0)
VLDL: 27.2 mg/dL (ref 0.0–40.0)

## 2019-11-26 LAB — HEMOGLOBIN A1C: Hgb A1c MFr Bld: 5.7 % (ref 4.6–6.5)

## 2019-11-27 ENCOUNTER — Ambulatory Visit: Payer: Medicare Other | Admitting: *Deleted

## 2019-11-27 ENCOUNTER — Other Ambulatory Visit: Payer: Self-pay

## 2019-11-27 DIAGNOSIS — M810 Age-related osteoporosis without current pathological fracture: Secondary | ICD-10-CM

## 2019-11-27 MED ORDER — DENOSUMAB 60 MG/ML ~~LOC~~ SOSY
60.0000 mg | PREFILLED_SYRINGE | Freq: Once | SUBCUTANEOUS | Status: AC
  Administered 2019-11-27: 60 mg via SUBCUTANEOUS

## 2019-11-27 NOTE — Progress Notes (Signed)
Pls cosign for prolia inj../lmb 

## 2019-11-28 ENCOUNTER — Encounter: Payer: Self-pay | Admitting: Endocrinology

## 2019-11-28 ENCOUNTER — Other Ambulatory Visit: Payer: Self-pay | Admitting: Internal Medicine

## 2019-11-28 ENCOUNTER — Ambulatory Visit (INDEPENDENT_AMBULATORY_CARE_PROVIDER_SITE_OTHER): Payer: Medicare Other | Admitting: Endocrinology

## 2019-11-28 ENCOUNTER — Telehealth: Payer: Self-pay | Admitting: *Deleted

## 2019-11-28 VITALS — BP 140/68 | HR 78 | Ht 64.0 in | Wt 166.2 lb

## 2019-11-28 DIAGNOSIS — E669 Obesity, unspecified: Secondary | ICD-10-CM

## 2019-11-28 DIAGNOSIS — E876 Hypokalemia: Secondary | ICD-10-CM

## 2019-11-28 DIAGNOSIS — Z1231 Encounter for screening mammogram for malignant neoplasm of breast: Secondary | ICD-10-CM

## 2019-11-28 DIAGNOSIS — E1169 Type 2 diabetes mellitus with other specified complication: Secondary | ICD-10-CM

## 2019-11-28 NOTE — Progress Notes (Signed)
Patient ID: [name], female   DOB: 04/21/1946, 74 y.o.   MRN: [id number]            Reason for Appointment: Follow up of diabetes  Primary care physician: [name]  History of Present Illness:          She has had prediabetes and also was told to have background retinopathy  On her periodic general checkups she has had an A1c done regularly and these have been at baseline upper normal around 6.0  She was evaluated with a glucose tolerance test in 10/2015 which showed the following: Fasting glucose 122, one-hour glucose 202 hour glucose 147  She was referred to the dietitian for meal planning and has been seen twice  Recent history:  Her A1c has been ranging between 6.2-6.4 mostly and last 6.3 This is now 5.7  With her having high blood sugars over 125 fasting she likely has had progression to overt diabetes Also has history of background retinopathy   She was started on Jardiance which she has been able to start after prior authorization  Previously had intolerance to Metformin ER and was only able to take 500 mg  She does not think she has any excessive urination, yeast infections or other side effects from this  Creatinine is slightly higher than before  Although she is still using her [employer] meter this may not be accurate compared to her lab blood sugar which was about 90 mg lower in the morning  She has checked her sugar several times a day before and after meals  Recently says highest blood sugars 174 after breakfast  However she still may sometimes eat sweets or more carbohydrates in the morning  She has continued to lose weight gradually and recently may have lost up to 4 pounds since her last visit  Although highest fasting blood sugar is 127 the lab glucose the same day was 109   PRE-MEAL Fasting Lunch Dinner Bedtime Overall  Glucose range:  113-127      Mean/median:      128   POST-MEAL PC Breakfast PC Lunch PC Dinner  Glucose range:  100-174   134-151  150-169  Mean/median:       Weight history:  Wt Readings from Last 3 Encounters:  11/28/19 166 lb 3.2 oz (75.4 kg)  11/07/19 167 lb (75.8 kg)  07/24/19 182 lb (82.6 kg)    Glycemic levels:   Lab Results  Component Value Date   HGBA1C 5.7 11/26/2019   HGBA1C 6.3 07/25/2019   HGBA1C 6.1 04/22/2019   Lab Results  Component Value Date   LDLCALC 100 (H) 11/26/2019   CREATININE 1.25 (H) 11/26/2019    Other problems: See review of systems   Allergies as of 11/28/2019      Reactions   Actonel [risedronate Sodium]    Upset stomach   Boniva [ibandronate Sodium]    cramp   Calcium Channel Blockers    Upset stomach   Chlorthalidone    Diarrhea per pt   Compazine    Daughter reacts to compazine/pt does not want to take   Lyrica [pregabalin]    Dizzy   Tape    redness      Medication List       Accurate as of November 28, 2019  1:32 PM. If you have any questions, ask your nurse or doctor.        amLODipine 5 MG tablet Commonly known as: NORVASC TAKE 1 TABLET BY MOUTH  EVERYDAY AT BEDTIME   aspirin 81 MG EC tablet Take 81 mg by mouth daily.   Charcoal Activated 260 MG Caps 1-2 caps tid prn gas. Do not take in 1 hr window of taking other meds   Cyanocobalamin 500 MCG/0.1ML Soln Commonly known as: Nascobal USE 1 SPRAY IN 1 NOSTRIL  ONCE A WEEK   denosumab 60 MG/ML Soln injection Commonly known as: PROLIA Inject 60 mg into the skin every 6 (six) months. Administer in upper arm, thigh, or abdomen   diphenoxylate-atropine 2.5-0.025 MG tablet Commonly known as: LOMOTIL TAKE 1 TABLET BY MOUTH FOUR TIMES A DAY AS NEEDED FOR DIARRHEA OR LOOSE STOOLS   furosemide 20 MG tablet Commonly known as: LASIX Take 0.5-1 tablets (10-20 mg total) by mouth daily as needed for edema (for leg swelling).   ipratropium 0.06 % nasal spray Commonly known as: ATROVENT Place 2 sprays into the nose 3 (three) times daily.   Jardiance 10 MG Tabs tablet Generic drug:  empagliflozin Take 10 mg by mouth daily with breakfast.   Lumigan 0.01 % Soln Generic drug: bimatoprost Place 1 drop into both eyes at bedtime.   potassium chloride SA 20 MEQ tablet Commonly known as: KLOR-CON Take 1 tablet (20 mEq total) by mouth daily.   propranolol ER 60 MG 24 hr capsule Commonly known as: INDERAL LA TAKE ONE CAPSULE BY MOUTH TWICE A DAY What changed:   how much to take  how to take this  when to take this   RABEprazole 20 MG tablet Commonly known as: ACIPHEX TAKE 1 TABLET BY MOUTH EVERY DAY   timolol 0.5 % ophthalmic gel-forming Commonly known as: TIMOPTIC-XR Place 1 drop into both eyes daily.   triamcinolone cream 0.1 % Commonly known as: KENALOG Apply 1 application topically 2 (two) times daily as needed (ear bacteria).   Vitamin D3 1.25 MG (50000 UT) Caps TAKE ONE CAPSULE BY MOUTH EVERY 30 DAYS       Allergies:  Allergies  Allergen Reactions  . Actonel [Risedronate Sodium]     Upset stomach  . Boniva [Ibandronate Sodium]     cramp  . Calcium Channel Blockers     Upset stomach  . Chlorthalidone     Diarrhea per pt  . Compazine     Daughter reacts to compazine/pt does not want to take  . Lyrica [Pregabalin]     Dizzy   . Tape     redness    Past Medical History:  Diagnosis Date  . Adenomatous colon polyp 04/08/2011  . Anemia   . Anxiety   . Cataract   . Essential hypertension 08/10/2007   Chronic  On Catapress (per Dr Ubaldo Glassing) - increase to bid - d/c Hydralazine prn per Dr Caryl Comes d/c 3/19: Increase Inderal LA to bid  . GERD (gastroesophageal reflux disease)   . Glaucoma (increased eye pressure)   . Heart murmur   . HTN (hypertension)   . IBS (irritable bowel syndrome)   . LBP (low back pain)   . Menopause   . Osteoporosis   . Ovarian cancer ([place]) 09/2008   Dr Gwyneth Revels  . Pancreatic cyst   . Vitamin B12 deficiency   . Vitamin D deficiency     Past Surgical History:  Procedure Laterality Date  . ABDOMINAL HYSTERECTOMY     . APPENDECTOMY    . CHOLECYSTECTOMY N/A 05/15/2017   Procedure: LAPAROSCOPIC CHOLECYSTECTOMY WITH INTRAOPERATIVE CHOLANGIOGRAM AND LYSIS OF ADHESIONS;  Surgeon: Fanny Skates, MD;  Location: [facility];  Service: General;  Laterality: N/A;  . ELBOW FRACTURE SURGERY     age 62- left elbow  . [place]  2001  . Ovarian Cancer Debulking  09/2008    Family History  Problem Relation Age of Onset  . Alzheimer's disease Mother 42  . Other Mother 74       TAH for excessive bleeding  . Lung cancer Father        smoker; metastasis to stomach and other areas  . Heart attack Maternal Uncle   . Other Paternal Aunt        stomach issues  . Heart Problems Paternal Uncle   . Other Maternal Grandmother        stomach issues; +hysterectomy  . Heart attack Maternal Grandfather   . Infertility Daughter   . Stomach cancer Cousin        dx. mid-60s  . Other Cousin        stomach issues  . Leukemia Cousin 18  . Stomach cancer Other   . Cancer Cousin        unknown type  . Other Cousin        stomach issues  . Heart Problems Maternal Uncle   . Diabetes Paternal Aunt   . Heart Problems Paternal Uncle   . Emphysema Paternal Uncle        work exposure  . Breast cancer Cousin        dx. late 60s-early 70s  . Colon cancer Neg Hx   . Rectal cancer Neg Hx   . Esophageal cancer Neg Hx     Social History:  reports that she has never smoked. She has never used smokeless tobacco. She reports that she does not drink alcohol or use drugs.    Review of Systems     Lipid history: She has not been on any statin drugs, lipids to be followed up with PCP    Lab Results  Component Value Date   CHOL 169 11/26/2019   HDL 41.90 11/26/2019   LDLCALC 100 (H) 11/26/2019   TRIG 136.0 11/26/2019   CHOLHDL 4 11/26/2019         RETINOPATHY: She is followed by ophthalmologist for background retinopathy  Hypertension followed by PCP and nephrologist  She is on amlodipine and Inderal No change  was made with starting Jardiance  BP Readings from Last 3 Encounters:  11/28/19 140/68  11/07/19 130/76  07/24/19 130/80   History of edema: She has not taken Lasix except rarely  HYPOKALEMIA: This appears to be from her chronic diarrhea, not on diuretics She thinks she is taking potassium supplements although occasionally may miss a dose  This is followed by her PCP  Lab Results  Component Value Date   CREATININE 1.25 (H) 11/26/2019   BUN 24 (H) 11/26/2019   NA 141 11/26/2019   K 3.3 (L) 11/26/2019   CL 107 11/26/2019   CO2 28 11/26/2019     Review of Systems   LABS:  Lab on 11/26/2019  Component Date Value Ref Range Status  . Cholesterol 11/26/2019 169  0 - 200 mg/dL Final   ATP III Classification       Desirable:  < 200 mg/dL               Borderline High:  200 - 239 mg/dL          High:  > = 240 mg/dL  . Triglycerides 11/26/2019 136.0  0.0 - 149.0 mg/dL Final  Normal:  <150 mg/dLBorderline High:  150 - 199 mg/dL  . HDL 11/26/2019 41.90  >39.00 mg/dL Final  . VLDL 11/26/2019 27.2  0.0 - 40.0 mg/dL Final  . LDL Cholesterol 11/26/2019 100* 0 - 99 mg/dL Final  . Total CHOL/HDL Ratio 11/26/2019 4   Final                  Men          Women1/2 Average Risk     3.4          3.3Average Risk          5.0          4.42X Average Risk          9.6          7.13X Average Risk          15.0          11.0                      . NonHDL 11/26/2019 126.70   Final   NOTE:  Non-HDL goal should be 30 mg/dL higher than patient's LDL goal (i.e. LDL goal of < 70 mg/dL, would have non-HDL goal of < 100 mg/dL)  . Sodium 11/26/2019 141  135 - 145 mEq/L Final  . Potassium 11/26/2019 3.3* 3.5 - 5.1 mEq/L Final  . Chloride 11/26/2019 107  96 - 112 mEq/L Final  . CO2 11/26/2019 28  19 - 32 mEq/L Final  . Glucose, Bld 11/26/2019 109* 70 - 99 mg/dL Final  . BUN 11/26/2019 24* 6 - 23 mg/dL Final  . Creatinine, Ser 11/26/2019 1.25* 0.40 - 1.20 mg/dL Final  . Total Bilirubin 11/26/2019 0.6  0.2 -  1.2 mg/dL Final  . Alkaline Phosphatase 11/26/2019 57  39 - 117 U/L Final  . AST 11/26/2019 16  0 - 37 U/L Final  . ALT 11/26/2019 14  0 - 35 U/L Final  . Total Protein 11/26/2019 7.3  6.0 - 8.3 g/dL Final  . Albumin 11/26/2019 4.2  3.5 - 5.2 g/dL Final  . GFR 11/26/2019 41.90* >60.00 mL/min Final  . Calcium 11/26/2019 9.5  8.4 - 10.5 mg/dL Final  . Hgb A1c MFr Bld 11/26/2019 5.7  4.6 - 6.5 % Final   Glycemic Control Guidelines for People with Diabetes:Non Diabetic:  <6%Goal of Therapy: <7%Additional Action Suggested:  >8%   Abstract on 11/25/2019  Component Date Value Ref Range Status  . HM Diabetic Eye Exam 11/20/2019 No Retinopathy  No Retinopathy Final    Physical Examination:  BP 140/68 (BP Location: Left Arm, Patient Position: Sitting, Cuff Size: Normal)   Pulse 78   Ht 5\' 4"  (1.626 m)   Wt 166 lb 3.2 oz (75.4 kg)   SpO2 98%   BMI 28.53 kg/m         ASSESSMENT/PLAN:  DIABETES with obesity and history of mild retinopathy:  She has mild diabetes based on fasting blood sugars over 130  Last A1c was 6.3 and is now 5.7 with Jardiance  Fasting glucose 109 in the lab She likely has some falsely high readings with her [employer] brand meter She was provided with a One Touch Verio meter today and prescription sent for supplies  She has done well with weight loss but however should try to exercise also to maintain this She will continue Jardiance Although she has a slight increase in creatinine this is likely temporary She does need  to increase her fluid intake  Hypokalemia: Continue problems with this related to diarrhea most likely I asked her to discuss using cholestyramine again with her PCP  LIPIDS: LDL is 100, will defer any statin treatment to PCP.  However may also improve with cholestyramine if she tries this  Blood pressure is still well controlled  Elayne Snare 11/28/2019, 1:32 PM   Note: This office note was prepared with Dragon voice recognition system  technology. Any transcriptional errors that result from this process are unintentional.

## 2019-11-28 NOTE — Telephone Encounter (Signed)
Patient called and scheduled an appt for 4/7

## 2019-12-02 ENCOUNTER — Other Ambulatory Visit: Payer: Self-pay

## 2019-12-02 ENCOUNTER — Telehealth: Payer: Self-pay | Admitting: Endocrinology

## 2019-12-02 MED ORDER — ONETOUCH DELICA LANCETS 30G MISC: 1.0000 | Freq: Three times a day (TID) | 2 refills | Status: DC

## 2019-12-02 MED ORDER — GLUCOSE BLOOD VI STRP: ORAL_STRIP | 2 refills | Status: DC

## 2019-12-02 NOTE — Telephone Encounter (Signed)
Rx sent 

## 2019-12-02 NOTE — Telephone Encounter (Signed)
MEDICATION: One Touch test strips and lancets  PHARMACY:  [id number] [address], [place] - [place] Phone:  [phone]  Fax:  [phone]       IS THIS A 90 DAY SUPPLY : yes  IS PATIENT OUT OF MEDICATION: yes  IF NOT; HOW MUCH IS LEFT:   LAST APPOINTMENT DATE: @3 /25/2021  NEXT APPOINTMENT DATE:@6 /16/2021  DO WE HAVE YOUR PERMISSION TO LEAVE A DETAILED MESSAGE: yes  OTHER COMMENTS: Patient's husband called asking if we could call in the lancets and strips for the One Touch meter she got last week.   **Let patient know to contact pharmacy at the end of the day to make sure medication is ready. **  ** Please notify patient to allow 48-72 hours to process**  **Encourage patient to contact the pharmacy for refills or they can request refills through [facility]**

## 2019-12-10 NOTE — Progress Notes (Addendum)
Follow Up Note: Gyn-Onc  [name] 74 y.o. female  CC: She presents for a follow-up visit  HPI: She has a remote h/o a stage IIIC Fallopian tube cancer Oncology History  Fallopian tube cancer, carcinoma ([place])  10/07/2008 Cancer Staging   Cancer Staging Fallopian tube cancer, carcinoma ([place]) Staging form: Fallopian Tube, AJCC 7th Edition - Clinical: IIIC - Unsigned - Pathologic: IIIC - Unsigned She was optimally debulked with minimal disease on peritoneal surfaces   04/14/2009 PET scan   NED   02/03/2014 Initial Diagnosis   Fallopian tube cancer, carcinoma ([place])    - 02/03/2009 Chemotherapy   . She received 6 cycles of carboplatin and Taxol chemotherapy completed in June 2010     Tumor Marker   Patient's tumor was tested for the following markers: CA-125 Results of the tumor marker test revealed : 7 mo ago  (04/22/19) 2 yr ago  (11/20/17) 3 yr ago  (11/16/16) 3 yr ago  (03/21/16)  CA 125 <35 U/mL 10  9 CM       Interval History:  She denies increasing abdominal girth pain or unexplained weight loss.  She complains of urge incontinence and incontinence of loose stool.  She is continent of flatus.  She denies loss of urine with cough or sneeze.   Review of Systems  Review of Systems  Constitutional: Negative for malaise/fatigue.  Gastrointestinal: Positive for diarrhea. Negative for abdominal pain.  Genitourinary: Positive for frequency and urgency.    Current Meds:  Outpatient Encounter Medications as of 12/11/2019  Medication Sig  . amLODipine (NORVASC) 5 MG tablet TAKE 1 TABLET BY MOUTH EVERYDAY AT BEDTIME  . aspirin 81 MG EC tablet Take 81 mg by mouth daily.    . bimatoprost (LUMIGAN) 0.01 % SOLN Place 1 drop into both eyes at bedtime.  . Blood Glucose Monitoring Suppl (ONETOUCH VERIO FLEX SYSTEM) w/Device KIT 1 each by Does not apply route in the morning, at noon, and at bedtime.   .  Charcoal Activated 260 MG CAPS 1-2 caps tid prn gas. Do not take in 1 hr window of taking other meds  . Cholecalciferol (VITAMIN D3) 1.25 MG (50000 UT) CAPS TAKE ONE CAPSULE BY MOUTH EVERY 30 DAYS  . Cyanocobalamin (NASCOBAL) 500 MCG/0.1ML SOLN USE 1 SPRAY IN 1 NOSTRIL  ONCE A WEEK  . denosumab (PROLIA) 60 MG/ML SOLN injection Inject 60 mg into the skin every 6 (six) months. Administer in upper arm, thigh, or abdomen  . diphenoxylate-atropine (LOMOTIL) 2.5-0.025 MG tablet TAKE 1 TABLET BY MOUTH FOUR TIMES A DAY AS NEEDED FOR DIARRHEA OR LOOSE STOOLS  . empagliflozin (JARDIANCE) 10 MG TABS tablet Take 10 mg by mouth daily with breakfast.  . furosemide (LASIX) 20 MG tablet Take 0.5-1 tablets (10-20 mg total) by mouth daily as needed for edema (for leg swelling).  [name] glucose blood test strip Use OneTouch Verio test strips as instructed to check blood sugar three times daily.  [name] ipratropium (ATROVENT) 0.06 % nasal spray Place 2 sprays into the nose 3 (three) times daily.  Glory Rosebush Delica Lancets 57X MISC 1 each by Does not apply route in the morning, at noon, and at bedtime. Use OneTouch Delica lancets to check blood sugar three times daily.  . potassium chloride SA (KLOR-CON) 20 MEQ tablet Take 1 tablet (20 mEq total) by mouth daily.  . propranolol ER (INDERAL LA) 60 MG 24 hr capsule TAKE ONE CAPSULE BY MOUTH TWICE A DAY (Patient taking differently: daily. )  .  RABEprazole (ACIPHEX) 20 MG tablet TAKE 1 TABLET BY MOUTH EVERY DAY  . timolol (TIMOPTIC-XR) 0.5 % ophthalmic gel-forming Place 1 drop into both eyes daily.   [name] triamcinolone cream (KENALOG) 0.1 % Apply 1 application topically 2 (two) times daily as needed (ear bacteria).    No facility-administered encounter medications on file as of 12/11/2019.    Allergy:  Allergies  Allergen Reactions  . Actonel [Risedronate Sodium]     Upset stomach  . Boniva [Ibandronate Sodium]     cramp  . Calcium Channel Blockers     Upset stomach  .  Chlorthalidone     Diarrhea per pt  . Compazine     Daughter reacts to compazine/pt does not want to take  . Lyrica [Pregabalin]     Dizzy   . Tape     redness    Social Hx:   Social History   Socioeconomic History  . Marital status: Married    Spouse name: Dominica Severin  . Number of children: 2  . Years of education: BS  . Highest education level: Not on file  Occupational History  . 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 Smoking status: Never Smoker  . Smokeless tobacco: Never Used  Substance and Sexual Activity  . Alcohol use: No    Alcohol/week: 0.0 standard drinks  . Drug use: No  . Sexual activity: Not on file  Other Topics Concern  . Not on file  Social History Narrative   Lives with spouse   Caffeine use: cokes      Right handed    Social Determinants of Health   Financial Resource Strain:   . Difficulty of Paying Living Expenses:   Food Insecurity:   . Worried About Charity fundraiser in the Last Year:   . Arboriculturist in the Last Year:   Transportation Needs:   . Film/video editor (Medical):   [name] Lack of Transportation (Non-Medical):   Physical Activity:   . Days of Exercise per Week:   . Minutes of Exercise per Session:   Stress:   . Feeling of Stress :   Social Connections:   . Frequency of Communication with Friends and Family:   . Frequency of Social Gatherings with Friends and Family:   . Attends Religious Services:   . Active Member of Clubs or Organizations:   . Attends Archivist Meetings:   [name] Marital Status:   Intimate Partner Violence:   . Fear of Current or Ex-Partner:   . Emotionally Abused:   [name] Physically Abused:   . Sexually Abused:     Past Surgical Hx:  Past Surgical History:  Procedure Laterality Date  . ABDOMINAL HYSTERECTOMY    . APPENDECTOMY    . CHOLECYSTECTOMY N/A 05/15/2017   Procedure: LAPAROSCOPIC CHOLECYSTECTOMY WITH INTRAOPERATIVE CHOLANGIOGRAM AND LYSIS OF ADHESIONS;  Surgeon:  Fanny Skates, MD;  Location: [facility];  Service: General;  Laterality: N/A;  . ELBOW FRACTURE SURGERY     age 14- left elbow  . [place]  2001  . Ovarian Cancer Debulking  09/2008    Past Medical Hx:  Past Medical History:  Diagnosis Date  . Adenomatous colon polyp 04/08/2011  . Anemia   . Anxiety   . Cataract   . Essential hypertension 08/10/2007   Chronic  On Catapress (per Dr Ubaldo Glassing) - increase to bid - d/c Hydralazine prn per Dr Caryl Comes d/c 3/19: Increase Inderal LA to bid  .  GERD (gastroesophageal reflux disease)   . Glaucoma (increased eye pressure)   . Heart murmur   . HTN (hypertension)   . IBS (irritable bowel syndrome)   . LBP (low back pain)   . Menopause   . Osteoporosis   . Ovarian cancer ([place]) 09/2008   Dr Gwyneth Revels  . Pancreatic cyst   . Vitamin B12 deficiency   . Vitamin D deficiency     Family Hx:  Family History  Problem Relation Age of Onset  . Alzheimer's disease Mother 70  . Other Mother 55       TAH for excessive bleeding  . Lung cancer Father        smoker; metastasis to stomach and other areas  . Heart attack Maternal Uncle   . Other Paternal Aunt        stomach issues  . Heart Problems Paternal Uncle   . Other Maternal Grandmother        stomach issues; +hysterectomy  . Heart attack Maternal Grandfather   . Infertility Daughter   . Stomach cancer Cousin        dx. mid-60s  . Other Cousin        stomach issues  . Leukemia Cousin 18  . Stomach cancer Other   . Cancer Cousin        unknown type  . Other Cousin        stomach issues  . Heart Problems Maternal Uncle   . Diabetes Paternal Aunt   . Heart Problems Paternal Uncle   . Emphysema Paternal Uncle        work exposure  . Breast cancer Cousin        dx. late 60s-early 70s  . Colon cancer Neg Hx   . Rectal cancer Neg Hx   . Esophageal cancer Neg Hx     Vitals:  BP (!) 128/58 (BP Location: Left Arm, Patient Position: Sitting)   Pulse (!) 56   Temp 98 F (36.7 C)  (Temporal)   Resp 16   Ht '5\' 4"'  (1.626 m)   Wt 166 lb 6 oz (75.5 kg)   SpO2 98%   BMI 28.56 kg/m    Physical Exam:  Physical Exam  Constitutional: She is oriented to person, place, and time and well-developed, well-nourished, and in no distress. No distress.  Abdominal: Soft. She exhibits no distension and no mass. There is no abdominal tenderness.  Genitourinary:    Vulva and vagina normal.     Genitourinary Comments: No palpable mass; cystocele present; +/- anal wink; PVR: 30 ml   Neurological: She is alert and oriented to person, place, and time.    Assessment/Plan:  Problem List Items Addressed This Visit    Fallopian tube cancer, carcinoma ([place]) - Primary    Negative symptom review.  Exam findings not suggestive of recurrence. Annual f/u with a generalist is appropriate.       Other Visit Diagnoses    Urge incontinence       Relevant Orders   Ambulatory referral to Urogynecology   Urinalysis, Complete w Microscopic   Urine Culture   Fecal smearing       Relevant Orders   Ambulatory referral to Urogynecology    Continue Kegel exercises Consider an antimuscarinic medication, vaginal estrogen Supportive measures were discussed include avoiding foods or activities known to worsen symptoms and improving perianal skin hygiene. This includes avoidance of incompletely digested sugars (eg, fructose, lactose) and caffeine. Also counseled re: supplementing the diet with  a bulking agent Landscape architect provided  I personally spent 30 minutes face-to-face and non-face-to-face in the care of this patient, which includes all pre, intra, and post visit time on the date of service.   Lahoma Crocker, MD 12/10/2019, 2:17 PM

## 2019-12-10 NOTE — Assessment & Plan Note (Signed)
Negative symptom review.  Exam findings not suggestive of recurrence. Annual f/u with a generalist is appropriate.

## 2019-12-11 ENCOUNTER — Inpatient Hospital Stay: Payer: Medicare Other | Attending: Obstetrics & Gynecology | Admitting: Obstetrics & Gynecology

## 2019-12-11 ENCOUNTER — Other Ambulatory Visit: Payer: Self-pay

## 2019-12-11 ENCOUNTER — Encounter: Payer: Self-pay | Admitting: Obstetrics & Gynecology

## 2019-12-11 ENCOUNTER — Inpatient Hospital Stay: Payer: Medicare Other

## 2019-12-11 VITALS — BP 128/58 | HR 56 | Temp 98.0°F | Resp 16 | Ht 64.0 in | Wt 166.4 lb

## 2019-12-11 DIAGNOSIS — I10 Essential (primary) hypertension: Secondary | ICD-10-CM | POA: Diagnosis not present

## 2019-12-11 DIAGNOSIS — N8110 Cystocele, unspecified: Secondary | ICD-10-CM | POA: Insufficient documentation

## 2019-12-11 DIAGNOSIS — M810 Age-related osteoporosis without current pathological fracture: Secondary | ICD-10-CM | POA: Insufficient documentation

## 2019-12-11 DIAGNOSIS — R151 Fecal smearing: Secondary | ICD-10-CM

## 2019-12-11 DIAGNOSIS — N3941 Urge incontinence: Secondary | ICD-10-CM

## 2019-12-11 DIAGNOSIS — C5700 Malignant neoplasm of unspecified fallopian tube: Secondary | ICD-10-CM | POA: Diagnosis not present

## 2019-12-11 DIAGNOSIS — M545 Low back pain: Secondary | ICD-10-CM | POA: Insufficient documentation

## 2019-12-11 DIAGNOSIS — R159 Full incontinence of feces: Secondary | ICD-10-CM | POA: Diagnosis not present

## 2019-12-11 DIAGNOSIS — Z9221 Personal history of antineoplastic chemotherapy: Secondary | ICD-10-CM | POA: Diagnosis not present

## 2019-12-11 DIAGNOSIS — Z79899 Other long term (current) drug therapy: Secondary | ICD-10-CM | POA: Diagnosis not present

## 2019-12-11 DIAGNOSIS — E538 Deficiency of other specified B group vitamins: Secondary | ICD-10-CM | POA: Insufficient documentation

## 2019-12-11 DIAGNOSIS — R143 Flatulence: Secondary | ICD-10-CM | POA: Diagnosis not present

## 2019-12-11 DIAGNOSIS — E559 Vitamin D deficiency, unspecified: Secondary | ICD-10-CM | POA: Diagnosis not present

## 2019-12-11 DIAGNOSIS — R197 Diarrhea, unspecified: Secondary | ICD-10-CM | POA: Insufficient documentation

## 2019-12-11 DIAGNOSIS — Z7982 Long term (current) use of aspirin: Secondary | ICD-10-CM | POA: Diagnosis not present

## 2019-12-11 DIAGNOSIS — K219 Gastro-esophageal reflux disease without esophagitis: Secondary | ICD-10-CM | POA: Diagnosis not present

## 2019-12-11 LAB — URINALYSIS, COMPLETE (UACMP) WITH MICROSCOPIC
Bilirubin Urine: NEGATIVE
Glucose, UA: >=500 mg/dL — AB
Ketones, ur: NEGATIVE mg/dL
Leukocytes,Ua: NEGATIVE
Nitrite: NEGATIVE
Protein, ur: 30 mg/dL — AB
Specific Gravity, Urine: 1.012 (ref 1.005–1.030)
pH: 6 (ref 5.0–8.0)

## 2019-12-11 NOTE — Patient Instructions (Addendum)
Kegel Exercises  Kegel exercises can help strengthen your pelvic floor muscles. The pelvic floor is a group of muscles that support your rectum, small intestine, and bladder. In females, pelvic floor muscles also help support the womb (uterus). These muscles help you control the flow of urine and stool. Kegel exercises are painless and simple, and they do not require any equipment. Your provider may suggest Kegel exercises to:  Improve bladder and bowel control.  Improve sexual response.  Improve weak pelvic floor muscles after surgery to remove the uterus (hysterectomy) or pregnancy (females).  Improve weak pelvic floor muscles after prostate gland removal or surgery (males). Kegel exercises involve squeezing your pelvic floor muscles, which are the same muscles you squeeze when you try to stop the flow of urine or keep from passing gas. The exercises can be done while sitting, standing, or lying down, but it is best to vary your position. Exercises How to do Kegel exercises: 1. Squeeze your pelvic floor muscles tight. You should feel a tight lift in your rectal area. If you are a female, you should also feel a tightness in your vaginal area. Keep your stomach, buttocks, and legs relaxed. 2. Hold the muscles tight for up to 10 seconds. 3. Breathe normally. 4. Relax your muscles. 5. Repeat as told by your health care provider. Repeat this exercise daily as told by your health care provider. Continue to do this exercise for at least 4-6 weeks, or for as long as told by your health care provider. You may be referred to a physical therapist who can help you learn more about how to do Kegel exercises. Depending on your condition, your health care provider may recommend:  Varying how long you squeeze your muscles.  Doing several sets of exercises every day.  Doing exercises for several weeks.  Making Kegel exercises a part of your regular exercise routine. This information is not intended  to replace advice given to you by your health care provider. Make sure you discuss any questions you have with your health care provider. Document Revised: 04/11/2018 Document Reviewed: 04/11/2018 Elsevier Patient Education  [place].    Conjugated Estrogens vaginal cream What is this medicine? CONJUGATED ESTROGENS (CON ju gate ed ESS troe jenz) are a mixture of female hormones. This cream can help relieve symptoms associated with menopause.like vaginal dryness and irritation. This medicine may be used for other purposes; ask your health care provider or pharmacist if you have questions. COMMON BRAND NAME(S): Premarin What should I tell my health care provider before I take this medicine? They need to know if you have any of these conditions:  abnormal vaginal bleeding  blood vessel disease or blood clots  breast, cervical, endometrial, or uterine cancer  dementia  diabetes  gallbladder disease  heart disease or recent heart attack  high blood pressure  high cholesterol  high level of calcium in the blood  hysterectomy  kidney disease  liver disease  migraine headaches  protein C deficiency  protein S deficiency  stroke  systemic lupus erythematosus (SLE)  tobacco smoker  an unusual or allergic reaction to estrogens other medicines, foods, dyes, or preservatives  pregnant or trying to get pregnant  breast-feeding How should I use this medicine? This medicine is for use in the vagina only. Do not take by mouth. Follow the directions on the prescription label. Use at bedtime unless otherwise directed by your doctor or health care professional. Use the special applicator supplied with the cream. Wash  hands before and after use. Fill the applicator with the cream and remove from the tube. Lie on your back, part and bend your knees. Insert the applicator into the vagina and push the plunger to expel the cream into the vagina. Wash the applicator with  warm soapy water and rinse well. Use exactly as directed for the complete length of time prescribed. Do not stop using except on the advice of your doctor or health care professional. Talk to your pediatrician regarding the use of this medicine in children. Special care may be needed. A patient package insert for the product will be given with each prescription and refill. Read this sheet carefully each time. The sheet may change frequently. Overdosage: If you think you have taken too much of this medicine contact a poison control center or emergency room at once. NOTE: This medicine is only for you. Do not share this medicine with others. What if I miss a dose? If you miss a dose, use it as soon as you can. If it is almost time for your next dose, use only that dose. Do not use double or extra doses. What may interact with this medicine? Do not take this medicine with any of the following medications:  aromatase inhibitors like aminoglutethimide, anastrozole, exemestane, letrozole, testolactone This medicine may also interact with the following medications:  barbiturates used for inducing sleep or treating seizures  carbamazepine  grapefruit juice  medicines for fungal infections like itraconazole and ketoconazole  raloxifene or tamoxifen  rifabutin  rifampin  rifapentine  ritonavir  some antibiotics used to treat infections  St. John's Wort  warfarin This list may not describe all possible interactions. Give your health care provider a list of all the medicines, herbs, non-prescription drugs, or dietary supplements you use. Also tell them if you smoke, drink alcohol, or use illegal drugs. Some items may interact with your medicine. What should I watch for while using this medicine? Visit your health care professional for regular checks on your progress. You will need a regular breast and pelvic exam. You should also discuss the need for regular mammograms with your health care  professional, and follow his or her guidelines. This medicine can make your body retain fluid, making your fingers, hands, or ankles swell. Your blood pressure can go up. Contact your doctor or health care professional if you feel you are retaining fluid. If you have any reason to think you are pregnant; stop taking this medicine at once and contact your doctor or health care professional. Tobacco smoking increases the risk of getting a blood clot or having a stroke, especially if you are more than 74 years old. You are strongly advised not to smoke. If you wear contact lenses and notice visual changes, or if the lenses begin to feel uncomfortable, consult your eye care specialist. If you are going to have elective surgery, you may need to stop taking this medicine beforehand. Consult your health care professional for advice prior to scheduling the surgery. What side effects may I notice from receiving this medicine? Side effects that you should report to your doctor or health care professional as soon as possible:  allergic reactions like skin rash, itching or hives, swelling of the face, lips, or tongue  breast tissue changes or discharge  changes in vision  chest pain  confusion, trouble speaking or understanding  dark urine  general ill feeling or flu-like symptoms  light-colored stools  nausea, vomiting  pain, swelling, warmth in the leg  right upper belly pain  severe headaches  shortness of breath  sudden numbness or weakness of the face, arm or leg  trouble walking, dizziness, loss of balance or coordination  unusual vaginal bleeding  yellowing of the eyes or skin Side effects that usually do not require medical attention (report to your doctor or health care professional if they continue or are bothersome):  hair loss  increased hunger or thirst  increased urination  symptoms of vaginal infection like itching, irritation or unusual discharge  unusually  weak or tired This list may not describe all possible side effects. Call your doctor for medical advice about side effects. You may report side effects to FDA at 1-800-FDA-1088. Where should I keep my medicine? Keep out of the reach of children. Store at room temperature between 15 and 30 degrees C (59 and 86 degrees F). Throw away any unused medicine after the expiration date. NOTE: This sheet is a summary. It may not cover all possible information. If you have questions about this medicine, talk to your doctor, pharmacist, or health care provider.  2020 Elsevier/Gold Standard (2010-11-24 09:20:36) Oxybutynin tablets What is this medicine? OXYBUTYNIN (ox i BYOO ti nin) is used to treat overactive bladder. This medicine reduces the amount of bathroom visits. It may also help to control wetting accidents. This medicine may be used for other purposes; ask your health care provider or pharmacist if you have questions. COMMON BRAND NAME(S): Ditropan What should I tell my health care provider before I take this medicine? They need to know if you have any of these conditions:  autonomic neuropathy  dementia  difficulty passing urine  glaucoma  intestinal obstruction  kidney disease  liver disease  myasthenia gravis  Parkinson's disease  an unusual or allergic reaction to oxybutynin, other medicines, foods, dyes, or preservatives  pregnant or trying to get pregnant  breast-feeding How should I use this medicine? Take this medicine by mouth with a glass of water. Follow the directions on the prescription label. You can take this medicine with or without food. Take your medicine at regular intervals. Do not take your medicine more often than directed. Talk to your pediatrician regarding the use of this medicine in children. Special care may be needed. While this drug may be prescribed for children as young as 5 years for selected conditions, precautions do apply. Overdosage: If you  think you have taken too much of this medicine contact a poison control center or emergency room at once. NOTE: This medicine is only for you. Do not share this medicine with others. What if I miss a dose? If you miss a dose, take it as soon as you can. If it is almost time for your next dose, take only that dose. Do not take double or extra doses. What may interact with this medicine?  antihistamines for allergy, cough and cold  atropine  certain medicines for bladder problems like oxybutynin, tolterodine  certain medicines for Parkinson's disease like benztropine, trihexyphenidyl  certain medicines for stomach problems like dicyclomine, hyoscyamine  certain medicines for travel sickness like scopolamine  clarithromycin  erythromycin  ipratropium  medicines for fungal infections, like fluconazole, itraconazole, ketoconazole or voriconazole This list may not describe all possible interactions. Give your health care provider a list of all the medicines, herbs, non-prescription drugs, or dietary supplements you use. Also tell them if you smoke, drink alcohol, or use illegal drugs. Some items may interact with your medicine. What should I watch for while using  this medicine? It may take a few weeks to notice the full benefit from this medicine. You may need to limit your intake tea, coffee, caffeinated sodas, and alcohol. These drinks may make your symptoms worse. You may get drowsy or dizzy. Do not drive, use machinery, or do anything that needs mental alertness until you know how this medicine affects you. Do not stand or sit up quickly, especially if you are an older patient. This reduces the risk of dizzy or fainting spells. Alcohol may interfere with the effect of this medicine. Avoid alcoholic drinks. Your mouth may get dry. Chewing sugarless gum or sucking hard candy, and drinking plenty of water may help. Contact your doctor if the problem does not go away or is severe. This  medicine may cause dry eyes and blurred vision. If you wear contact lenses, you may feel some discomfort. Lubricating drops may help. See your eyecare professional if the problem does not go away or is severe. Avoid extreme heat. This medicine can cause you to sweat less than normal. Your body temperature could increase to dangerous levels, which may lead to heat stroke. What side effects may I notice from receiving this medicine? Side effects that you should report to your doctor or health care professional as soon as possible:  allergic reactions like skin rash, itching or hives, swelling of the face, lips, or tongue  agitation  breathing problems  confusion  fever  flushing (reddening of the skin)  hallucinations  memory loss  pain or difficulty passing urine  palpitations  unusually weak or tired Side effects that usually do not require medical attention (report to your doctor or health care professional if they continue or are bothersome):  constipation  headache  sexual difficulties (impotence) This list may not describe all possible side effects. Call your doctor for medical advice about side effects. You may report side effects to FDA at 1-800-FDA-1088. Where should I keep my medicine? Keep out of the reach of children. Store at room temperature between 15 and 30 degrees C (59 and 86 degrees F). Protect from moisture and humidity. Throw away any unused medicine after the expiration date. NOTE: This sheet is a summary. It may not cover all possible information. If you have questions about this medicine, talk to your doctor, pharmacist, or health care provider.  2020 Elsevier/Gold Standard (2013-11-07 10:57:08)    Urinary Incontinence  Urinary incontinence refers to a condition in which a person is unable to control where and when to pass urine. A person with this condition will urinate when he or she does not mean to (involuntarily). What are the causes? This  condition may be caused by:  Medicines.  Infections.  Constipation.  Overactive bladder muscles.  Weak bladder muscles.  Weak pelvic floor muscles. These muscles provide support for the bladder, intestine, and, in women, the uterus.  Enlarged prostate in men. The prostate is a gland near the bladder. When it gets too big, it can pinch the urethra. With the urethra blocked, the bladder can weaken and lose the ability to empty properly.  Surgery.  Emotional factors, such as anxiety, stress, or post-traumatic stress disorder (PTSD).  Pelvic organ prolapse. This happens in women when organs shift out of place and into the vagina. This shift can prevent the bladder and urethra from working properly. What increases the risk? The following factors may make you more likely to develop this condition:  Older age.  Obesity and physical inactivity.  Pregnancy and childbirth.  Menopause.  Diseases that affect the nerves or spinal cord (neurological diseases).  Long-term (chronic) coughing. This can increase pressure on the bladder and pelvic floor muscles. What are the signs or symptoms? Symptoms may vary depending on the type of urinary incontinence you have. They include:  A sudden urge to urinate, but passing urine involuntarily before you can get to a bathroom (urge incontinence).  Suddenly passing urine with any activity that forces urine to pass, such as coughing, laughing, exercise, or sneezing (stress incontinence).  Needing to urinate often, but urinating only a small amount, or constantly dribbling urine (overflow incontinence).  Urinating because you cannot get to the bathroom in time due to a physical disability, such as arthritis or injury, or communication and thinking problems, such as Alzheimer disease (functional incontinence). How is this diagnosed? This condition may be diagnosed based on:  Your medical history.  A physical exam.  Tests, such as: ? Urine  tests. ? X-rays of your kidney and bladder. ? Ultrasound. ? CT scan. ? Cystoscopy. In this procedure, a health care provider inserts a tube with a light and camera (cystoscope) through the urethra and into the bladder in order to check for problems. ? Urodynamic testing. These tests assess how well the bladder, urethra, and sphincter can store and release urine. There are different types of urodynamic tests, and they vary depending on what the test is measuring. To help diagnose your condition, your health care provider may recommend that you keep a log of when you urinate and how much you urinate. How is this treated? Treatment for this condition depends on the type of incontinence that you have and its cause. Treatment may include:  Lifestyle changes, such as: ? Quitting smoking. ? Maintaining a healthy weight. ? Staying active. Try to get 150 minutes of moderate-intensity exercise every week. Ask your health care provider which activities are safe for you. ? Eating a healthy diet.  Avoid high-fat foods, like fried foods.  Avoid refined carbohydrates like white bread and white rice.  Limit how much alcohol and caffeine you drink.  Increase your fiber intake. Foods such as fresh fruits, vegetables, beans, and whole grains are healthy sources of fiber.  Pelvic floor muscle exercises.  Bladder training, such as lengthening the amount of time between bathroom breaks, or using the bathroom at regular intervals.  Using techniques to suppress bladder urges. This can include distraction techniques or controlled breathing exercises.  Medicines to relax the bladder muscles and prevent bladder spasms.  Medicines to help slow or prevent the growth of a man's prostate.  Botox injections. These can help relax the bladder muscles.  Using pulses of electricity to help change bladder reflexes (electrical nerve stimulation).  For women, using a medical device to prevent urine leaks. This is a  small, tampon-like, disposable device that is inserted into the urethra.  Injecting collagen or carbon beads (bulking agents) into the urinary sphincter. These can help thicken tissue and close the bladder opening.  Surgery. Follow these instructions at home: Lifestyle  Limit alcohol and caffeine. These can fill your bladder quickly and irritate it.  Keep yourself clean to help prevent odors and skin damage. Ask your doctor about special skin creams and cleansers that can protect the skin from urine.  Consider wearing pads or adult diapers. Make sure to change them regularly, and always change them right after experiencing incontinence. General instructions  Take over-the-counter and prescription medicines only as told by your health care provider.  Use  the bathroom about every 3-4 hours, even if you do not feel the need to urinate. Try to empty your bladder completely every time. After urinating, wait a minute. Then try to urinate again.  Make sure you are in a relaxed position while urinating.  If your incontinence is caused by nerve problems, keep a log of the medicines you take and the times you go to the bathroom.  Keep all follow-up visits as told by your health care provider. This is important. Contact a health care provider if:  You have pain that gets worse.  Your incontinence gets worse. Get help right away if:  You have a fever or chills.  You are unable to urinate.  You have redness in your groin area or down your legs. Summary  Urinary incontinence refers to a condition in which a person is unable to control where and when to pass urine.  This condition may be caused by medicines, infection, weak bladder muscles, weak pelvic floor muscles, enlargement of the prostate (in men), or surgery.  The following factors increase your risk for developing this condition: older age, obesity, pregnancy and childbirth, menopause, neurological diseases, and chronic  coughing.  There are several types of urinary incontinence. They include urge incontinence, stress incontinence, overflow incontinence, and functional incontinence.  This condition is usually treated first with lifestyle and behavioral changes, such as quitting smoking, eating a healthier diet, and doing regular pelvic floor exercises. Other treatment options include medicines, bulking agents, medical devices, electrical nerve stimulation, or surgery. This information is not intended to replace advice given to you by your health care provider. Make sure you discuss any questions you have with your health care provider. Document Revised: 09/01/2017 Document Reviewed: 12/01/2016 Elsevier Patient Education  [place].   Fecal Incontinence Fecal incontinence, also called accidental bowel leakage, is not being able to control your bowels. This condition happens because the nerves or muscles around the anus do not work the way they should. This affects their ability to hold stool (feces). What are the causes? This condition may be caused by:  Damage to the muscles at the end of the rectum (sphincter).  Damage to the nerves that control bowel movements.  Diarrhea.  Chronic constipation.  Pelvic floor dysfunction. This means the muscles in the pelvis do not work well.  Loss of bowel storage capacity. This occurs when the rectum can no longer stretch in size in order to store feces.  Inflammatory bowel disease (IBD), such as Crohn's disease.  Irritable bowel syndrome (IBS). What increases the risk? You are more likely to develop this condition if you:  Were born with bowels or a pelvis that did not form correctly.  Have had rectal surgery.  Have had radiation treatment for certain cancers.  Have been pregnant, had a vaginal delivery, or had surgery that damaged the pelvic floor muscles.  Had a complicated childbirth, spinal cord injury, or other trauma that caused nerve  damage.  Have a condition that can affect nerve function, such as diabetes, Parkinson's disease, or multiple sclerosis.  Have a condition where the rectum drops down into the anus or vagina (prolapse).  Are 60 years of age or older. What are the signs or symptoms? The main symptom of this condition is not being able to control your bowels. You also might not be able to get to the bathroom before a bowel movement. How is this diagnosed? This condition is diagnosed with a medical history and physical exam. You  may also have other tests, including:  Blood tests.  Urine tests.  A rectal exam.  Ultrasound.  MRI.  Colonoscopy. This is an exam that looks at your large intestine (colon).  Anal manometry. This is a test that measures the strength of the anal sphincter.  Anal electromyogram (EMG). This is a test that uses small electrodes to check for nerve damage. How is this treated? Treatment for this condition depends on the cause and severity. Treatment may also focus on addressing any underlying causes of this condition. Treatment may include:  Medicines. This may include medicines to: ? Prevent diarrhea. ? Help with constipation (bulk-forming laxatives). ? Treat any underlying conditions.  Biofeedback therapy. This can help to retrain muscles that are affected.  Fiber supplements. These can help manage your bowel movements.  Nerve stimulation.  Injectable gel to promote tissue growth and better muscle control.  Surgery. You may need: ? Sphincter repair surgery. ? Diversion surgery. This procedure lets feces pass out of your body through a hole in your abdomen. Follow these instructions at home: Eating and drinking   Follow instructions from your health care provider about any eating or drinking restrictions. ? Work with a dietitian to come up with a healthy diet that will help you avoid the foods that can make your condition worse. ? Keep a diet diary to find out  which foods or drinks could be making your condition worse.  Drink enough fluid to keep your urine pale yellow. Lifestyle  Do not use any products that contain nicotine or tobacco, such as cigarettes and e-cigarettes. If you need help quitting, ask your health care provider. This may help your condition.  If you are overweight, talk with your health care provider about how to safely lose weight. This may help your condition.  Increase your physical activity as told by your health care provider. This may help your condition. Always talk with your health care provider before starting a new exercise program.  Carry a change of clothes and supplies to clean up quickly if you have an episode of fetal incontinence.  Consider joining a fecal incontinence support group. You can find a support group online or in your local community. General instructions   Take over-the-counter and prescription medicines only as told by your health care provider. This includes any supplements.  Apply a moisture barrier, such as petroleum jelly, to your rectum. This protects the skin from irritation caused by ongoing leaking or diarrhea.  Tell your health care provider if you are upset or depressed about your condition.  Keep all follow-up visits as told by your health care provider. This is important. Where to find more information  International Foundation for Functional Gastrointestinal Disorders: iffgd.[employer] [facility]: patients.gi.org Contact a health care provider if:  You have a fever.  You have redness, swelling, or pain around your rectum.  Your pain is getting worse or you lose feeling in your rectal area.  You have blood in your stool.  You feel sad or hopeless.  You avoid social or work situations. Get help right away if:  You stop having bowel movements.  You cannot eat or drink without vomiting.  You have rectal bleeding that does not stop.  You have  severe pain that is getting worse.  You have symptoms of dehydration, including: ? Sleepiness or fatigue. ? Producing little or no urine, tears, or sweat. ? Dizziness. ? Dry mouth. ? Unusual irritability. ? Headache. ? Inability to think  clearly. Summary  Fecal incontinence, also called accidental bowel leakage, is not being able to control your bowels. This condition happens because the nerves or muscles around the anus do not work the way they should.  Treatment varies depending on the cause and severity of your condition. Treatment may also focus on addressing any underlying causes of this condition.  Follow instructions from your health care provider about any eating or drinking restrictions, lifestyle changes, and skin care.  Take over-the-counter and prescription medicines only as told by your health care provider. This includes any supplements.  Tell your health care provider if your symptoms worsen or if you are upset or depressed about your condition. This information is not intended to replace advice given to you by your health care provider. Make sure you discuss any questions you have with your health care provider. Document Revised: 01/04/2018 Document Reviewed: 01/04/2018 Elsevier Patient Education  [employer].   Return in 1 yr

## 2019-12-12 ENCOUNTER — Telehealth: Payer: Self-pay | Admitting: *Deleted

## 2019-12-12 LAB — URINE CULTURE: Culture: NO GROWTH

## 2019-12-12 NOTE — Telephone Encounter (Signed)
Per Lenna Sciara APP fax urinalysis results to both the patient's PCP and endocrinologist

## 2019-12-13 ENCOUNTER — Telehealth: Payer: Self-pay

## 2019-12-13 ENCOUNTER — Telehealth: Payer: Self-pay | Admitting: *Deleted

## 2019-12-13 NOTE — Telephone Encounter (Signed)
Fax office note and clearance form to dr Maryland Pink

## 2019-12-13 NOTE — Telephone Encounter (Signed)
Told Ms Kratt that the urince culture showed no sign of infection.  Referral sent today to Dr. Maryland Pink as discussed with Dr. Lahoma Crocker at visit on 12-11-19. Pt verbalized understanding.

## 2019-12-18 ENCOUNTER — Telehealth: Payer: Self-pay

## 2019-12-18 NOTE — Telephone Encounter (Signed)
Called office of Dr. Maryland Pink to verify receipt of referral.  Patient has been scheduled for February 19, 2020 at the [facility] office, [place].

## 2019-12-19 ENCOUNTER — Telehealth: Payer: Self-pay | Admitting: *Deleted

## 2019-12-19 NOTE — Telephone Encounter (Signed)
error 

## 2019-12-20 ENCOUNTER — Ambulatory Visit
Admission: RE | Admit: 2019-12-20 | Discharge: 2019-12-20 | Disposition: A | Discharge status: Home / Self Care | Payer: Medicare Other | Source: Ambulatory Visit | Attending: Internal Medicine | Admitting: Internal Medicine

## 2019-12-20 ENCOUNTER — Other Ambulatory Visit: Payer: Self-pay

## 2019-12-20 DIAGNOSIS — Z1231 Encounter for screening mammogram for malignant neoplasm of breast: Secondary | ICD-10-CM | POA: Diagnosis not present

## 2020-01-21 DIAGNOSIS — R0989 Other specified symptoms and signs involving the circulatory and respiratory systems: Secondary | ICD-10-CM | POA: Diagnosis not present

## 2020-01-28 DIAGNOSIS — R0989 Other specified symptoms and signs involving the circulatory and respiratory systems: Secondary | ICD-10-CM | POA: Diagnosis not present

## 2020-02-19 ENCOUNTER — Other Ambulatory Visit (INDEPENDENT_AMBULATORY_CARE_PROVIDER_SITE_OTHER): Payer: Medicare Other

## 2020-02-19 ENCOUNTER — Other Ambulatory Visit: Payer: Self-pay

## 2020-02-19 DIAGNOSIS — N819 Female genital prolapse, unspecified: Secondary | ICD-10-CM | POA: Diagnosis not present

## 2020-02-19 DIAGNOSIS — E669 Obesity, unspecified: Secondary | ICD-10-CM

## 2020-02-19 DIAGNOSIS — R152 Fecal urgency: Secondary | ICD-10-CM | POA: Diagnosis not present

## 2020-02-19 DIAGNOSIS — E1169 Type 2 diabetes mellitus with other specified complication: Secondary | ICD-10-CM | POA: Diagnosis not present

## 2020-02-19 DIAGNOSIS — N952 Postmenopausal atrophic vaginitis: Secondary | ICD-10-CM | POA: Diagnosis not present

## 2020-02-19 DIAGNOSIS — Z6828 Body mass index (BMI) 28.0-28.9, adult: Secondary | ICD-10-CM | POA: Diagnosis not present

## 2020-02-19 DIAGNOSIS — R350 Frequency of micturition: Secondary | ICD-10-CM | POA: Diagnosis not present

## 2020-02-19 DIAGNOSIS — R159 Full incontinence of feces: Secondary | ICD-10-CM | POA: Diagnosis not present

## 2020-02-19 DIAGNOSIS — N8110 Cystocele, unspecified: Secondary | ICD-10-CM | POA: Diagnosis not present

## 2020-02-19 LAB — COMPREHENSIVE METABOLIC PANEL
ALT: 13 U/L (ref 0–35)
AST: 20 U/L (ref 0–37)
Albumin: 4.5 g/dL (ref 3.5–5.2)
Alkaline Phosphatase: 60 U/L (ref 39–117)
BUN: 25 mg/dL — ABNORMAL HIGH (ref 6–23)
CO2: 28 mEq/L (ref 19–32)
Calcium: 9.3 mg/dL (ref 8.4–10.5)
Chloride: 107 mEq/L (ref 96–112)
Creatinine, Ser: 1.19 mg/dL (ref 0.40–1.20)
GFR: 44.32 mL/min — ABNORMAL LOW (ref >60.00)
Glucose, Bld: 110 mg/dL — ABNORMAL HIGH (ref 70–99)
Potassium: 3.6 mEq/L (ref 3.5–5.1)
Sodium: 142 mEq/L (ref 135–145)
Total Bilirubin: 0.6 mg/dL (ref 0.2–1.2)
Total Protein: 7.7 g/dL (ref 6.0–8.3)

## 2020-02-19 LAB — MICROALBUMIN / CREATININE URINE RATIO
Creatinine,U: 182 mg/dL
Microalb Creat Ratio: 4.5 mg/g (ref 0.0–30.0)
Microalb, Ur: 8.3 mg/dL — ABNORMAL HIGH (ref 0.0–1.9)

## 2020-02-19 LAB — HEMOGLOBIN A1C: Hgb A1c MFr Bld: 6 % (ref 4.6–6.5)

## 2020-02-26 ENCOUNTER — Other Ambulatory Visit: Payer: Self-pay

## 2020-02-26 ENCOUNTER — Encounter: Payer: Self-pay | Admitting: Endocrinology

## 2020-02-26 ENCOUNTER — Ambulatory Visit (INDEPENDENT_AMBULATORY_CARE_PROVIDER_SITE_OTHER): Payer: Medicare Other | Admitting: Endocrinology

## 2020-02-26 VITALS — BP 132/82 | HR 52 | Ht 64.0 in | Wt 161.2 lb

## 2020-02-26 DIAGNOSIS — E1169 Type 2 diabetes mellitus with other specified complication: Secondary | ICD-10-CM | POA: Diagnosis not present

## 2020-02-26 DIAGNOSIS — E669 Obesity, unspecified: Secondary | ICD-10-CM | POA: Diagnosis not present

## 2020-02-26 NOTE — Progress Notes (Signed)
Patient ID: [name], female   DOB: July 26, 1946, 74 y.o.   MRN: [id number]            Reason for Appointment: Follow up of diabetes  Primary care physician: [name]  History of Present Illness:          She has had prediabetes and also was told to have background retinopathy  On her periodic general checkups she has had an A1c done regularly and these have been at baseline upper normal around 6.0  She was evaluated with a glucose tolerance test in 10/2015 which showed the following: Fasting glucose 122, one-hour glucose 202 hour glucose 147  She was referred to the dietitian for meal planning and has been seen twice  Recent history:  Her A1c has been ranging between 5.7 -6.4 mostly  This is now 6% compared to 5.7  With her having high blood sugars over 125 fasting she likely has had overt diabetes Also has history of background retinopathy   She was continued on Jardiance for improvement in her blood sugars and other benefits  No side effects with this such as yeast infections  Previously had intolerance to Metformin ER and was only able to take 500 mg  She is now using the One Touch meter to monitor blood sugars  Has lost 5 pounds  Last nutritional consultation was in 04/2018  PRE-MEAL Fasting Lunch Dinner Bedtime Overall  Glucose range:  88-134   95-129   88-156  Mean/median:  113     119   POST-MEAL PC Breakfast PC Lunch PC Dinner  Glucose range:  105-145   107-156  Mean/median:    133   Previous data:  PRE-MEAL Fasting Lunch Dinner Bedtime Overall  Glucose range:  113-127      Mean/median:      128   POST-MEAL PC Breakfast PC Lunch PC Dinner  Glucose range:  100-174  134-151  150-169  Mean/median:       Weight history:  Wt Readings from Last 3 Encounters:  02/26/20 161 lb 3.2 oz (73.1 kg)  12/11/19 166 lb 6 oz (75.5 kg)  11/28/19 166 lb 3.2 oz (75.4 kg)    Glycemic levels:   Lab Results  Component Value Date   HGBA1C 6.0 02/19/2020    HGBA1C 5.7 11/26/2019   HGBA1C 6.3 07/25/2019   Lab Results  Component Value Date   MICROALBUR 8.3 (H) 02/19/2020   LDLCALC 100 (H) 11/26/2019   CREATININE 1.19 02/19/2020    Other problems: See review of systems   Allergies as of 02/26/2020      Reactions   Actonel [risedronate Sodium]    Upset stomach   Boniva [ibandronate Sodium]    cramp   Calcium Channel Blockers    Upset stomach   Chlorthalidone    Diarrhea per pt   Compazine    Daughter reacts to compazine/pt does not want to take   Lyrica [pregabalin]    Dizzy   Tape    redness      Medication List       Accurate as of February 26, 2020 11:40 AM. If you have any questions, ask your nurse or doctor.        amLODipine 5 MG tablet Commonly known as: NORVASC TAKE 1 TABLET BY MOUTH EVERYDAY AT BEDTIME   aspirin 81 MG EC tablet Take 81 mg by mouth daily.   Charcoal Activated 260 MG Caps 1-2 caps tid prn gas. Do not take in 1  hr window of taking other meds   CITRUCEL PO Take 17 g by mouth daily. Mix 17grams (1 tbsp) with 3-6 tbsp of water and drink daily.   Cyanocobalamin 500 MCG/0.1ML Soln Commonly known as: Nascobal USE 1 SPRAY IN 1 NOSTRIL  ONCE A WEEK   denosumab 60 MG/ML Soln injection Commonly known as: PROLIA Inject 60 mg into the skin every 6 (six) months. Administer in upper arm, thigh, or abdomen   diphenoxylate-atropine 2.5-0.025 MG tablet Commonly known as: LOMOTIL TAKE 1 TABLET BY MOUTH FOUR TIMES A DAY AS NEEDED FOR DIARRHEA OR LOOSE STOOLS   furosemide 20 MG tablet Commonly known as: LASIX Take 0.5-1 tablets (10-20 mg total) by mouth daily as needed for edema (for leg swelling).   glucose blood test strip Use OneTouch Verio test strips as instructed to check blood sugar three times daily.   ipratropium 0.06 % nasal spray Commonly known as: ATROVENT Place 2 sprays into the nose 3 (three) times daily.   Jardiance 10 MG Tabs tablet Generic drug: empagliflozin Take 10 mg by mouth  daily with breakfast.   Lumigan 0.01 % Soln Generic drug: bimatoprost Place 1 drop into both eyes at bedtime.   OneTouch Delica Lancets 70W Misc 1 each by Does not apply route in the morning, at noon, and at bedtime. Use OneTouch Delica lancets to check blood sugar three times daily.   OneTouch Verio Flex System w/Device Kit 1 each by Does not apply route in the morning, at noon, and at bedtime.   potassium chloride SA 20 MEQ tablet Commonly known as: KLOR-CON Take 1 tablet (20 mEq total) by mouth daily.   propranolol ER 60 MG 24 hr capsule Commonly known as: INDERAL LA TAKE ONE CAPSULE BY MOUTH TWICE A DAY What changed:   how much to take  how to take this  when to take this   RABEprazole 20 MG tablet Commonly known as: ACIPHEX TAKE 1 TABLET BY MOUTH EVERY DAY   timolol 0.5 % ophthalmic gel-forming Commonly known as: TIMOPTIC-XR Place 1 drop into both eyes daily.   Timolol Maleate 0.5 % (DAILY) Soln   triamcinolone cream 0.1 % Commonly known as: KENALOG Apply 1 application topically 2 (two) times daily as needed (ear bacteria).   Vitamin D3 1.25 MG (50000 UT) Caps TAKE ONE CAPSULE BY MOUTH EVERY 30 DAYS       Allergies:  Allergies  Allergen Reactions  . Actonel [Risedronate Sodium]     Upset stomach  . Boniva [Ibandronate Sodium]     cramp  . Calcium Channel Blockers     Upset stomach  . Chlorthalidone     Diarrhea per pt  . Compazine     Daughter reacts to compazine/pt does not want to take  . Lyrica [Pregabalin]     Dizzy   . Tape     redness    Past Medical History:  Diagnosis Date  . Adenomatous colon polyp 04/08/2011  . Anemia   . Anxiety   . Cataract   . Essential hypertension 08/10/2007   Chronic  On Catapress (per Dr Ubaldo Glassing) - increase to bid - d/c Hydralazine prn per Dr Caryl Comes d/c 3/19: Increase Inderal LA to bid  . GERD (gastroesophageal reflux disease)   . Glaucoma (increased eye pressure)   . Heart murmur   . HTN (hypertension)     . IBS (irritable bowel syndrome)   . LBP (low back pain)   . Menopause   . Osteoporosis   .  Ovarian cancer ([place]) 09/2008   Dr Gwyneth Revels  . Pancreatic cyst   . Vitamin B12 deficiency   . Vitamin D deficiency     Past Surgical History:  Procedure Laterality Date  . ABDOMINAL HYSTERECTOMY    . APPENDECTOMY    . CHOLECYSTECTOMY N/A 05/15/2017   Procedure: LAPAROSCOPIC CHOLECYSTECTOMY WITH INTRAOPERATIVE CHOLANGIOGRAM AND LYSIS OF ADHESIONS;  Surgeon: Fanny Skates, MD;  Location: [facility];  Service: General;  Laterality: N/A;  . ELBOW FRACTURE SURGERY     age 9- left elbow  . [place]  2001  . Ovarian Cancer [place]  09/2008    Family History  Problem Relation Age of Onset  . Alzheimer's disease Mother 52  . Other Mother 36       TAH for excessive bleeding  . Lung cancer Father        smoker; metastasis to stomach and other areas  . Heart attack Maternal Uncle   . Other Paternal Aunt        stomach issues  . Heart Problems Paternal Uncle   . Other Maternal Grandmother        stomach issues; +hysterectomy  . Heart attack Maternal Grandfather   . Infertility Daughter   . Stomach cancer Cousin        dx. mid-60s  . Other Cousin        stomach issues  . Leukemia Cousin 18  . Stomach cancer Other   . Cancer Cousin        unknown type  . Other Cousin        stomach issues  . Heart Problems Maternal Uncle   . Diabetes Paternal Aunt   . Heart Problems Paternal Uncle   . Emphysema Paternal Uncle        work exposure  . Breast cancer Cousin        dx. late 60s-early 70s  . Colon cancer Neg Hx   . Rectal cancer Neg Hx   . Esophageal cancer Neg Hx     Social History:  reports that she has never smoked. She has never used smokeless tobacco. She reports that she does not drink alcohol and does not use drugs.    Review of Systems     Lipid history: She has not been on any statin drugs, lipids to be followed up with PCP    Lab Results  Component Value Date    CHOL 169 11/26/2019   HDL 41.90 11/26/2019   LDLCALC 100 (H) 11/26/2019   TRIG 136.0 11/26/2019   CHOLHDL 4 11/26/2019         RETINOPATHY: She is followed by ophthalmologist for background retinopathy  Hypertension followed by PCP and nephrologist  She is on amlodipine and Inderal She does monitor at home also  BP Readings from Last 3 Encounters:  02/26/20 132/82  12/11/19 (!) 128/58  11/28/19 140/68   History of edema: She has not taken Lasix except rarely  HYPOKALEMIA: This appears to be from her chronic diarrhea, not on diuretics, now taking Citrucel for diarrhea  Has been prescribed potassium supplements This is followed by her PCP  Lab Results  Component Value Date   CREATININE 1.19 02/19/2020   BUN 25 (H) 02/19/2020   NA 142 02/19/2020   K 3.6 02/19/2020   CL 107 02/19/2020   CO2 28 02/19/2020     Review of Systems   LABS:  No visits with results within 1 Week(s) from this visit.  Latest known visit with results  is:  Lab on 02/19/2020  Component Date Value Ref Range Status  . Microalb, Ur 02/19/2020 8.3* 0.0 - 1.9 mg/dL Final  . Creatinine,U 02/19/2020 182.0  mg/dL Final  . Microalb Creat Ratio 02/19/2020 4.5  0.0 - 30.0 mg/g Final  . Sodium 02/19/2020 142  135 - 145 mEq/L Final  . Potassium 02/19/2020 3.6  3.5 - 5.1 mEq/L Final  . Chloride 02/19/2020 107  96 - 112 mEq/L Final  . CO2 02/19/2020 28  19 - 32 mEq/L Final  . Glucose, Bld 02/19/2020 110* 70 - 99 mg/dL Final  . BUN 02/19/2020 25* 6 - 23 mg/dL Final  . Creatinine, Ser 02/19/2020 1.19  0.40 - 1.20 mg/dL Final  . Total Bilirubin 02/19/2020 0.6  0.2 - 1.2 mg/dL Final  . Alkaline Phosphatase 02/19/2020 60  39 - 117 U/L Final  . AST 02/19/2020 20  0 - 37 U/L Final  . ALT 02/19/2020 13  0 - 35 U/L Final  . Total Protein 02/19/2020 7.7  6.0 - 8.3 g/dL Final  . Albumin 02/19/2020 4.5  3.5 - 5.2 g/dL Final  . GFR 02/19/2020 44.32* >60.00 mL/min Final  . Calcium 02/19/2020 9.3  8.4 - 10.5  mg/dL Final  . Hgb A1c MFr Bld 02/19/2020 6.0  4.6 - 6.5 % Final   Glycemic Control Guidelines for People with Diabetes:Non Diabetic:  <6%Goal of Therapy: <7%Additional Action Suggested:  >8%     Physical Examination:  BP 132/82 (BP Location: Left Arm, Patient Position: Sitting, Cuff Size: Normal)   Pulse (!) 52   Ht _0  (1.626 m)   Wt 161 lb 3.2 oz (73.1 kg)   SpO2 99%   BMI 27.67 kg/m         ASSESSMENT/PLAN:  DIABETES with obesity and history of mild retinopathy:  She has mild diabetes based on fasting blood sugars over 130  Although A1c was  5.7 with previously starting Jardiance it is now slightly higher at 6%  Fasting glucose about the same at 110 in the lab She does check her blood sugars regularly with her One Touch monitor Currently her morning sugars are averaging about 115 and postprandial readings are under 160 consistently  Her weight has come down However she is still not motivated to exercise much She will continue Jardiance  She can cut down on checking her blood sugar as she does not have much fluctuation She can likely check her blood sugars every other day with some readings before breakfast and some after one of her meals  Hypokalemia: Improved   Elayne Snare 02/26/2020, 11:40 AM   Note: This office note was prepared with Estate agent. Any transcriptional errors that result from this process are unintentional.

## 2020-02-26 NOTE — Patient Instructions (Signed)
Check blood sugars on waking up 2-3 days a week  Also check blood sugars about 2 hours after meals and do this after different meals by rotation  Recommended blood sugar levels on waking up are 90-130 and about 2 hours after meal is 130-160  Please bring your blood sugar monitor to each visit, thank you   

## 2020-03-01 ENCOUNTER — Other Ambulatory Visit: Payer: Self-pay | Admitting: Endocrinology

## 2020-03-18 DIAGNOSIS — N393 Stress incontinence (female) (male): Secondary | ICD-10-CM | POA: Diagnosis not present

## 2020-03-18 DIAGNOSIS — N816 Rectocele: Secondary | ICD-10-CM | POA: Diagnosis not present

## 2020-03-18 DIAGNOSIS — N8111 Cystocele, midline: Secondary | ICD-10-CM | POA: Diagnosis not present

## 2020-03-24 ENCOUNTER — Other Ambulatory Visit: Payer: Self-pay | Admitting: Internal Medicine

## 2020-04-09 DIAGNOSIS — N8110 Cystocele, unspecified: Secondary | ICD-10-CM | POA: Diagnosis not present

## 2020-04-09 DIAGNOSIS — N3946 Mixed incontinence: Secondary | ICD-10-CM | POA: Diagnosis not present

## 2020-04-09 DIAGNOSIS — N8189 Other female genital prolapse: Secondary | ICD-10-CM | POA: Diagnosis not present

## 2020-04-09 HISTORY — PX: BLADDER SURGERY: SHX569

## 2020-04-21 DIAGNOSIS — R399 Unspecified symptoms and signs involving the genitourinary system: Secondary | ICD-10-CM | POA: Diagnosis not present

## 2020-04-22 ENCOUNTER — Other Ambulatory Visit: Payer: Self-pay | Admitting: *Deleted

## 2020-04-22 MED ORDER — EMPAGLIFLOZIN 10 MG PO TABS: 10.0000 mg | ORAL_TABLET | Freq: Every day | ORAL | 1 refills | Status: DC

## 2020-05-13 ENCOUNTER — Other Ambulatory Visit: Payer: Self-pay

## 2020-05-13 ENCOUNTER — Ambulatory Visit (INDEPENDENT_AMBULATORY_CARE_PROVIDER_SITE_OTHER): Payer: Medicare Other | Admitting: Internal Medicine

## 2020-05-13 ENCOUNTER — Encounter: Payer: Self-pay | Admitting: Internal Medicine

## 2020-05-13 DIAGNOSIS — M720 Palmar fascial fibromatosis [Dupuytren]: Secondary | ICD-10-CM

## 2020-05-13 DIAGNOSIS — R197 Diarrhea, unspecified: Secondary | ICD-10-CM | POA: Diagnosis not present

## 2020-05-13 DIAGNOSIS — E538 Deficiency of other specified B group vitamins: Secondary | ICD-10-CM | POA: Diagnosis not present

## 2020-05-13 DIAGNOSIS — E669 Obesity, unspecified: Secondary | ICD-10-CM | POA: Diagnosis not present

## 2020-05-13 DIAGNOSIS — E1169 Type 2 diabetes mellitus with other specified complication: Secondary | ICD-10-CM | POA: Diagnosis not present

## 2020-05-13 DIAGNOSIS — I10 Essential (primary) hypertension: Secondary | ICD-10-CM | POA: Diagnosis not present

## 2020-05-13 DIAGNOSIS — N813 Complete uterovaginal prolapse: Secondary | ICD-10-CM

## 2020-05-13 DIAGNOSIS — N819 Female genital prolapse, unspecified: Secondary | ICD-10-CM | POA: Insufficient documentation

## 2020-05-13 MED ORDER — NASCOBAL 500 MCG/0.1ML NA SOLN: NASAL | 3 refills | Status: DC

## 2020-05-13 MED ORDER — AMLODIPINE BESYLATE 5 MG PO TABS: 5.0000 mg | ORAL_TABLET | Freq: Every day | ORAL | 3 refills | Status: DC

## 2020-05-13 MED ORDER — PROPRANOLOL HCL ER 60 MG PO CP24: 60.0000 mg | ORAL_CAPSULE | Freq: Two times a day (BID) | ORAL | 3 refills | Status: DC

## 2020-05-13 NOTE — Progress Notes (Signed)
Subjective:  Patient ID: [name], female    DOB: 24-Aug-1946  Age: 74 y.o. MRN: [id number]  CC: No chief complaint on file.   HPI [name] presents for post-op prolapse surgery in August - doing well. F/u B12 def, IBS, HTN   Outpatient Medications Prior to Visit  Medication Sig Dispense Refill  . amLODipine (NORVASC) 5 MG tablet TAKE 1 TABLET BY MOUTH EVERYDAY AT BEDTIME  2  . aspirin 81 MG EC tablet Take 81 mg by mouth daily.      . bimatoprost (LUMIGAN) 0.01 % SOLN Place 1 drop into both eyes at bedtime.    . Blood Glucose Monitoring Suppl (ONETOUCH VERIO FLEX SYSTEM) w/Device KIT 1 each by Does not apply route in the morning, at noon, and at bedtime.     . Cholecalciferol (VITAMIN D3) 1.25 MG (50000 UT) CAPS TAKE ONE CAPSULE BY MOUTH EVERY 30 DAYS 9 capsule 3  . Cyanocobalamin (NASCOBAL) 500 MCG/0.1ML SOLN USE 1 SPRAY IN 1 NOSTRIL  ONCE A WEEK 3 Bottle 3  . denosumab (PROLIA) 60 MG/ML SOLN injection Inject 60 mg into the skin every 6 (six) months. Administer in upper arm, thigh, or abdomen    . diphenoxylate-atropine (LOMOTIL) 2.5-0.025 MG tablet TAKE 1 TABLET BY MOUTH FOUR TIMES A DAY AS NEEDED FOR DIARRHEA OR LOOSE STOOLS 60 tablet 3  . empagliflozin (JARDIANCE) 10 MG TABS tablet Take 1 tablet (10 mg total) by mouth daily with breakfast. 90 tablet 1  . furosemide (LASIX) 20 MG tablet Take 0.5-1 tablets (10-20 mg total) by mouth daily as needed for edema (for leg swelling). 90 tablet 1  . ipratropium (ATROVENT) 0.06 % nasal spray Place 2 sprays into the nose 3 (three) times daily. 15 mL 2  . Methylcellulose, Laxative, (CITRUCEL PO) Take 17 g by mouth daily. Mix 17grams (1 tbsp) with 3-6 tbsp of water and drink daily.    Glory Rosebush Delica Lancets 01X MISC 1 each by Does not apply route in the morning, at noon, and at bedtime. Use OneTouch Delica lancets to check blood sugar three times daily. 100 each 2  . ONETOUCH VERIO test strip USE ONETOUCH VERIO TEST STRIPS AS  INSTRUCTED TO CHECK BLOOD SUGAR THREE TIMES DAILY. 100 strip 2  . potassium chloride SA (KLOR-CON) 20 MEQ tablet Take 1 tablet (20 mEq total) by mouth daily. 90 tablet 3  . propranolol ER (INDERAL LA) 60 MG 24 hr capsule TAKE ONE CAPSULE BY MOUTH TWICE A DAY (Patient taking differently: One tablet once a day) 180 capsule 3  . RABEprazole (ACIPHEX) 20 MG tablet TAKE 1 TABLET BY MOUTH EVERY DAY 90 tablet 1  . timolol (TIMOPTIC-XR) 0.5 % ophthalmic gel-forming Place 1 drop into both eyes daily.     . Timolol Maleate 0.5 % (DAILY) SOLN     . triamcinolone cream (KENALOG) 0.1 % Apply 1 application topically 2 (two) times daily as needed (ear bacteria).     . Charcoal Activated 260 MG CAPS 1-2 caps tid prn gas. Do not take in 1 hr window of taking other meds (Patient not taking: Reported on 05/13/2020) 100 capsule 5   No facility-administered medications prior to visit.    ROS: Review of Systems  Constitutional: Negative for activity change, appetite change, chills, fatigue and unexpected weight change.  HENT: Negative for congestion, mouth sores and sinus pressure.   Eyes: Negative for visual disturbance.  Respiratory: Negative for cough and chest tightness.   Gastrointestinal: Negative for  abdominal pain and nausea.  Genitourinary: Negative for difficulty urinating, frequency and vaginal pain.  Musculoskeletal: Negative for back pain and gait problem.  Skin: Negative for pallor and rash.  Neurological: Negative for dizziness, tremors, weakness, numbness and headaches.  Psychiatric/Behavioral: Negative for confusion, sleep disturbance and suicidal ideas. The patient is nervous/anxious.     Objective:  BP (!) 152/66 (BP Location: Right Arm, Patient Position: Sitting, Cuff Size: Large)   Pulse (!) 59   Temp 98.3 F (36.8 C) (Oral)   Ht _0  (1.626 m)   Wt 158 lb (71.7 kg)   SpO2 98%   BMI 27.12 kg/m   BP Readings from Last 3 Encounters:  05/13/20 (!) 152/66  02/26/20 132/82  12/11/19  (!) 128/58    Wt Readings from Last 3 Encounters:  05/13/20 158 lb (71.7 kg)  02/26/20 161 lb 3.2 oz (73.1 kg)  12/11/19 166 lb 6 oz (75.5 kg)    Physical Exam Constitutional:      General: She is not in acute distress.    Appearance: She is well-developed. She is obese.  HENT:     Head: Normocephalic.     Right Ear: External ear normal.     Left Ear: External ear normal.     Nose: Nose normal.  Eyes:     General:        Right eye: No discharge.        Left eye: No discharge.     Conjunctiva/sclera: Conjunctivae normal.     Pupils: Pupils are equal, round, and reactive to light.  Neck:     Thyroid: No thyromegaly.     Vascular: No JVD.     Trachea: No tracheal deviation.  Cardiovascular:     Rate and Rhythm: Normal rate and regular rhythm.     Heart sounds: Normal heart sounds.  Pulmonary:     Effort: No respiratory distress.     Breath sounds: No stridor. No wheezing.  Abdominal:     General: Bowel sounds are normal. There is no distension.     Palpations: Abdomen is soft. There is no mass.     Tenderness: There is no abdominal tenderness. There is no guarding or rebound.  Musculoskeletal:        General: No tenderness.     Cervical back: Normal range of motion and neck supple.  Lymphadenopathy:     Cervical: No cervical adenopathy.  Skin:    Findings: No erythema or rash.  Neurological:     Cranial Nerves: No cranial nerve deficit.     Motor: No abnormal muscle tone.     Coordination: Coordination normal.     Deep Tendon Reflexes: Reflexes normal.  Psychiatric:        Behavior: Behavior normal.        Thought Content: Thought content normal.        Judgment: Judgment normal.   L hand w/trigger finger #3 and thickened flexor tendon  Lab Results  Component Value Date   WBC 6.3 04/02/2018   HGB 11.2 (L) 04/02/2018   HCT 33.0 (L) 04/02/2018   PLT 193 04/02/2018   GLUCOSE 110 (H) 02/19/2020   CHOL 169 11/26/2019   TRIG 136.0 11/26/2019   HDL 41.90  11/26/2019   LDLCALC 100 (H) 11/26/2019   ALT 13 02/19/2020   AST 20 02/19/2020   NA 142 02/19/2020   K 3.6 02/19/2020   CL 107 02/19/2020   CREATININE 1.19 02/19/2020   BUN 25 (H) 02/19/2020  CO2 28 02/19/2020   TSH 1.50 07/23/2018   HGBA1C 6.0 02/19/2020   MICROALBUR 8.3 (H) 02/19/2020    MM 3D SCREEN BREAST BILATERAL  Result Date: 12/20/2019 CLINICAL DATA:  Screening. EXAM: DIGITAL SCREENING BILATERAL MAMMOGRAM WITH TOMO AND CAD COMPARISON:  Previous exam(s). ACR Breast Density Category b: There are scattered areas of fibroglandular density. FINDINGS: There are no findings suspicious for malignancy. Images were processed with CAD. IMPRESSION: No mammographic evidence of malignancy. A result letter of this screening mammogram will be mailed directly to the patient. RECOMMENDATION: Screening mammogram in one year. (Code:SM-B-01Y) BI-RADS CATEGORY  1: Negative. Electronically Signed   By: Curlene Dolphin M.D.   On: 12/20/2019 12:53    Assessment & Plan:    Walker Kehr, MD

## 2020-05-13 NOTE — Assessment & Plan Note (Signed)
F/u w/Dr Kumar 

## 2020-05-13 NOTE — Assessment & Plan Note (Addendum)
Better now Citrucel Off milk, bread

## 2020-05-13 NOTE — Assessment & Plan Note (Signed)
Nascobal

## 2020-05-13 NOTE — Assessment & Plan Note (Addendum)
On Rx - Inderal, Norvasc  Lasix prn

## 2020-05-13 NOTE — Assessment & Plan Note (Signed)
Ortho ref offered Vit E

## 2020-05-13 NOTE — Patient Instructions (Addendum)
[place] started vaccine booster sign up. Please call [facility] at [phone].   Dupuytren's Contracture Dupuytren's contracture is a condition in which tissue under the skin of the palm becomes thick. This causes one or more of the fingers to curl inward (contract) toward the palm. After a while, the fingers may not be able to straighten out. This condition affects some or all of the fingers and the palm of the hand. This condition may affect one or both hands. Dupuytren's contracture is a long-term (chronic) condition that develops (progresses) slowly over time. There is no cure, but symptoms can be managed and progression can be slowed with treatment. This condition is usually not dangerous or painful, but it can interfere with everyday tasks. What are the causes?  This condition is caused by tissue (fascia) in the palm that gets thicker and tighter. When the fascia thickens, it pulls on the cords of tissue (tendons) that control finger movement. This causes the fingers to contract. The cause of fascia thickening is not known. However, the condition is often passed along from parent to child (inherited). What increases the risk? The following factors may make you more likely to develop this condition:  Being 37 years of age or older.  Being female.  Having a family history of this condition.  Using tobacco products, including cigarettes, chewing tobacco, and e-cigarettes.  Drinking alcohol excessively.  Having diabetes.  Having a seizure disorder. What are the signs or symptoms? Early symptoms of this condition may include:  Thick, puckered skin on the hand.  One or more lumps (nodules) on the palm. Nodules may be tender when they first appear, but they are generally painless. Later symptoms of this condition may include:  Thick cords of tissue in the palm.  Fingers curled up toward the palm.  Inability to straighten the fingers into their normal  position. Though this condition is usually painless, you may have discomfort when holding or grabbing objects. How is this diagnosed? This condition is diagnosed with a physical exam, which may include:  Looking at your hands and feeling your palms. This is to check for thickened fascia and nodules.  Measuring finger motion.  Doing the [place] tabletop test. You may be asked to try to put your hand on a surface, with your palm down and your fingers straight out. How is this treated? There is no cure for this condition, but treatment can relieve discomfort and make symptoms more manageable. Treatment options may include:  Physical therapy. This can strengthen your hand and increase flexibility.  Occupational therapy. This can help you with everyday tasks that may be more difficult because of your condition.  Shots (injections). Substances may be injected into your hand, such as: ? Medicines that help to decrease swelling (corticosteroids). ? Proteins (collagenase) to weaken thick tissue. After a collagenase injection, your health care provider may stretch your fingers.  Needle aponeurotomy. A needle is pushed through the skin and into the fascia. Moving the needle against the fascia can weaken or break up the thick tissue.  Surgery. This may be needed if your condition causes discomfort or interferes with everyday activities. Physical therapy is usually needed after surgery. No treatment is guaranteed to cure this condition. Recurrence of symptoms is common. Follow these instructions at home: Hand care  Take these actions to help protect your hand from possible injury: ? Use tools that have padded grips. ? Wear protective gloves while you work with your hands. ? Avoid repetitive hand movements.  General instructions  Take over-the-counter and prescription medicines only as told by your health care provider.  Manage any other conditions that you have, such as diabetes.  If physical  therapy was prescribed, do exercises as told by your health care provider.  Do not use any products that contain nicotine or tobacco, such as cigarettes, e-cigarettes, and chewing tobacco. If you need help quitting, ask your health care provider.  If you drink alcohol: ? Limit how much you use to:  0-1 drink a day for women.  0-2 drinks a day for men. ? Be aware of how much alcohol is in your drink. In the [place] one drink equals one 12 oz bottle of beer (355 mL), one 5 oz glass of wine (148 mL), or one 1 oz glass of hard liquor (44 mL).  Keep all follow-up visits as told by your health care provider. This is important. Contact a health care provider if:  You develop new symptoms, or your symptoms get worse.  You have pain that gets worse or does not get better with medicine.  You have difficulty or discomfort with everyday tasks.  You develop numbness or tingling. Get help right away if:  You have severe pain.  Your fingers change color or become unusually cold. Summary  Dupuytren's contracture is a condition in which tissue under the skin of the palm becomes thick.  This condition is caused by tissue (fascia) that thickens. When it thickens, it pulls on the cords of tissue (tendons) that control finger movement and makes the fingers to contract.  You are more likely to develop this condition if you are a man, are over 34 years of age, have a family history of the condition, and drink a lot of alcohol.  This condition can be treated with physical and occupational therapy, injections, and surgery.  Follow instructions about how to care for your hand. Get help right away if you have severe pain or your fingers change color or become cold. This information is not intended to replace advice given to you by your health care provider. Make sure you discuss any questions you have with your health care provider. Document Revised: 03/13/2018 Document Reviewed: 03/13/2018 Elsevier  Patient Education  [place].

## 2020-05-13 NOTE — Assessment & Plan Note (Signed)
post-op prolapse surgery in August 2021 - doing well.

## 2020-05-20 DIAGNOSIS — R350 Frequency of micturition: Secondary | ICD-10-CM | POA: Diagnosis not present

## 2020-05-20 DIAGNOSIS — R82998 Other abnormal findings in urine: Secondary | ICD-10-CM | POA: Diagnosis not present

## 2020-05-20 DIAGNOSIS — N8111 Cystocele, midline: Secondary | ICD-10-CM | POA: Diagnosis not present

## 2020-05-20 DIAGNOSIS — N393 Stress incontinence (female) (male): Secondary | ICD-10-CM | POA: Diagnosis not present

## 2020-05-25 ENCOUNTER — Other Ambulatory Visit: Payer: Self-pay | Admitting: Internal Medicine

## 2020-05-25 NOTE — Telephone Encounter (Signed)
   Patient states pharmacy does not have Cyanocobalamin (NASCOBAL) 500 MCG/0.1ML SOLN Please resend

## 2020-05-27 DIAGNOSIS — H40013 Open angle with borderline findings, low risk, bilateral: Secondary | ICD-10-CM | POA: Diagnosis not present

## 2020-05-28 ENCOUNTER — Other Ambulatory Visit: Payer: Self-pay

## 2020-05-28 MED ORDER — NASCOBAL 500 MCG/0.1ML NA SOLN: NASAL | 3 refills | Status: DC

## 2020-05-28 NOTE — Telephone Encounter (Signed)
Script resent today.

## 2020-06-02 ENCOUNTER — Ambulatory Visit: Payer: Medicare Other | Attending: Internal Medicine

## 2020-06-02 DIAGNOSIS — Z23 Encounter for immunization: Secondary | ICD-10-CM

## 2020-06-02 NOTE — Progress Notes (Signed)
   [facility] Vaccination [facility]  Name:  [name]    MRN: [id number] DOB: 12/03/1945  06/02/2020  [name] was observed post Covid-19 immunization for 15 minutes without incident. She was provided with Vaccine Information Sheet and instruction to access the V-Safe system.   [name] was instructed to call 911 with any severe reactions post vaccine: [name] Difficulty breathing  . Swelling of face and throat  . A fast heartbeat  . A bad rash all over body  . Dizziness and weakness

## 2020-06-18 ENCOUNTER — Other Ambulatory Visit: Payer: Self-pay | Admitting: Endocrinology

## 2020-06-29 ENCOUNTER — Other Ambulatory Visit: Payer: Self-pay

## 2020-06-29 ENCOUNTER — Other Ambulatory Visit (INDEPENDENT_AMBULATORY_CARE_PROVIDER_SITE_OTHER): Payer: Medicare Other

## 2020-06-29 DIAGNOSIS — E1169 Type 2 diabetes mellitus with other specified complication: Secondary | ICD-10-CM

## 2020-06-29 DIAGNOSIS — E669 Obesity, unspecified: Secondary | ICD-10-CM

## 2020-06-29 LAB — BASIC METABOLIC PANEL
BUN: 23 mg/dL (ref 6–23)
CO2: 28 mEq/L (ref 19–32)
Calcium: 9.5 mg/dL (ref 8.4–10.5)
Chloride: 105 mEq/L (ref 96–112)
Creatinine, Ser: 1.13 mg/dL (ref 0.40–1.20)
GFR: 47.91 mL/min — ABNORMAL LOW (ref >60.00)
Glucose, Bld: 100 mg/dL — ABNORMAL HIGH (ref 70–99)
Potassium: 3.5 mEq/L (ref 3.5–5.1)
Sodium: 142 mEq/L (ref 135–145)

## 2020-06-29 LAB — HEMOGLOBIN A1C: Hgb A1c MFr Bld: 5.7 % (ref 4.6–6.5)

## 2020-07-01 ENCOUNTER — Encounter: Payer: Self-pay | Admitting: Endocrinology

## 2020-07-01 ENCOUNTER — Other Ambulatory Visit: Payer: Self-pay

## 2020-07-01 ENCOUNTER — Ambulatory Visit (INDEPENDENT_AMBULATORY_CARE_PROVIDER_SITE_OTHER): Payer: Medicare Other | Admitting: Endocrinology

## 2020-07-01 VITALS — BP 130/84 | HR 58 | Ht 64.0 in | Wt 159.0 lb

## 2020-07-01 DIAGNOSIS — Z23 Encounter for immunization: Secondary | ICD-10-CM

## 2020-07-01 DIAGNOSIS — E669 Obesity, unspecified: Secondary | ICD-10-CM | POA: Diagnosis not present

## 2020-07-01 DIAGNOSIS — E1169 Type 2 diabetes mellitus with other specified complication: Secondary | ICD-10-CM | POA: Diagnosis not present

## 2020-07-01 NOTE — Progress Notes (Signed)
Patient ID: [name], female   DOB: Mar 24, 1946, 74 y.o.   MRN: [id number]            Reason for Appointment: Follow up of diabetes  Primary care physician: [name]  History of Present Illness:      Previously had A1c levels in the prediabetic range and had been at baseline upper normal around 6.0  She was evaluated with a glucose tolerance test in 10/2015 which showed the following: Fasting glucose 122, one-hour glucose 202 hour glucose 147  She was referred to the dietitian for meal planning and advised weight loss  Recent history:  Her A1c has been ranging between 5.7 -6.4 mostly  This is now  5.7  Current medications for diabetes: Jardiance 10 mg daily, started in 11/20  With her having high blood sugars over 125 fasting she was diagnosed with overt diabetes Also has history of background retinopathy   She was continued on Jardiance which had improved her sugars overall  No side effects with this such as yeast infections  Previously had intolerance to Metformin ER and was only able to take 500 mg  She is using the One Touch meter to monitor blood sugars while taking about every other day on an average  Recently has not checked readings fasting which previously had been as high as 134, lab fasting glucose was 100  She thinks she is generally watching her diet, weight has leveled off  She is not motivated to exercise again and she thinks that increased activity worsens her urine incontinence  Last nutritional consultation was in 04/2018   PRE-MEAL Fasting Lunch Dinner Bedtime Overall  Glucose range:  121     101-156  Mean/median:   120    128   POST-MEAL PC Breakfast PC Lunch PC Dinner  Glucose range:  101-144   123-156  Mean/median:  123   138   Previously:  PRE-MEAL Fasting Lunch Dinner Bedtime Overall  Glucose range:  88-134   95-129   88-156  Mean/median:  113     119   POST-MEAL PC Breakfast PC Lunch PC Dinner  Glucose range:  105-145    107-156  Mean/median:    133    Weight history:  Wt Readings from Last 3 Encounters:  07/01/20 159 lb (72.1 kg)  05/13/20 158 lb (71.7 kg)  02/26/20 161 lb 3.2 oz (73.1 kg)    Glycemic levels:   Lab Results  Component Value Date   HGBA1C 5.7 06/29/2020   HGBA1C 6.0 02/19/2020   HGBA1C 5.7 11/26/2019   Lab Results  Component Value Date   MICROALBUR 8.3 (H) 02/19/2020   LDLCALC 100 (H) 11/26/2019   CREATININE 1.13 06/29/2020    Other problems: See review of systems   Allergies as of 07/01/2020      Reactions   Actonel [risedronate Sodium]    Upset stomach   Boniva [ibandronate Sodium]    cramp   Calcium Channel Blockers    Upset stomach   Chlorthalidone    Diarrhea per pt   Compazine    Daughter reacts to compazine/pt does not want to take   Lyrica [pregabalin]    Dizzy   Tape    redness      Medication List       Accurate as of July 01, 2020 11:37 AM. If you have any questions, ask your nurse or doctor.        amLODipine 5 MG tablet Commonly known as: NORVASC Take  1 tablet (5 mg total) by mouth daily.   aspirin 81 MG EC tablet Take 81 mg by mouth daily.   Charcoal Activated 260 MG Caps 1-2 caps tid prn gas. Do not take in 1 hr window of taking other meds   CITRUCEL PO Take 17 g by mouth daily. Mix 17grams (1 tbsp) with 3-6 tbsp of water and drink daily.   denosumab 60 MG/ML Soln injection Commonly known as: PROLIA Inject 60 mg into the skin every 6 (six) months. Administer in upper arm, thigh, or abdomen   diphenoxylate-atropine 2.5-0.025 MG tablet Commonly known as: LOMOTIL TAKE 1 TABLET BY MOUTH FOUR TIMES A DAY AS NEEDED FOR DIARRHEA OR LOOSE STOOLS   empagliflozin 10 MG Tabs tablet Commonly known as: Jardiance Take 1 tablet (10 mg total) by mouth daily with breakfast.   furosemide 20 MG tablet Commonly known as: LASIX Take 0.5-1 tablets (10-20 mg total) by mouth daily as needed for edema (for leg swelling).   ipratropium 0.06 %  nasal spray Commonly known as: ATROVENT Place 2 sprays into the nose 3 (three) times daily.   Lumigan 0.01 % Soln Generic drug: bimatoprost Place 1 drop into both eyes at bedtime.   Nascobal 500 MCG/0.1ML Soln Generic drug: Cyanocobalamin USE 1 SPRAY IN 1 NOSTRIL  ONCE A WEEK   OneTouch Delica Lancets 26Z Misc 1 each by Does not apply route in the morning, at noon, and at bedtime. Use OneTouch Delica lancets to check blood sugar three times daily.   OneTouch Verio Flex System w/Device Kit 1 each by Does not apply route in the morning, at noon, and at bedtime.   OneTouch Verio test strip Generic drug: glucose blood USE ONETOUCH VERIO TEST STRIPS AS INSTRUCTED TO CHECK BLOOD SUGAR THREE TIMES DAILY.   potassium chloride SA 20 MEQ tablet Commonly known as: KLOR-CON Take 1 tablet (20 mEq total) by mouth daily.   propranolol ER 60 MG 24 hr capsule Commonly known as: INDERAL LA Take 1 capsule (60 mg total) by mouth 2 (two) times daily.   RABEprazole 20 MG tablet Commonly known as: ACIPHEX TAKE 1 TABLET BY MOUTH EVERY DAY   timolol 0.5 % ophthalmic gel-forming Commonly known as: TIMOPTIC-XR Place 1 drop into both eyes daily.   Timolol Maleate 0.5 % (DAILY) Soln   triamcinolone cream 0.1 % Commonly known as: KENALOG Apply 1 application topically 2 (two) times daily as needed (ear bacteria).   Vitamin D3 1.25 MG (50000 UT) Caps TAKE ONE CAPSULE BY MOUTH EVERY 30 DAYS       Allergies:  Allergies  Allergen Reactions  . Actonel [Risedronate Sodium]     Upset stomach  . Boniva [Ibandronate Sodium]     cramp  . Calcium Channel Blockers     Upset stomach  . Chlorthalidone     Diarrhea per pt  . Compazine     Daughter reacts to compazine/pt does not want to take  . Lyrica [Pregabalin]     Dizzy   . Tape     redness    Past Medical History:  Diagnosis Date  . Adenomatous colon polyp 04/08/2011  . Anemia   . Anxiety   . Cataract   . Essential hypertension  08/10/2007   Chronic  On Catapress (per Dr Ubaldo Glassing) - increase to bid - d/c Hydralazine prn per Dr Caryl Comes d/c 3/19: Increase Inderal LA to bid  . GERD (gastroesophageal reflux disease)   . Glaucoma (increased eye pressure)   . Heart  murmur   . HTN (hypertension)   . IBS (irritable bowel syndrome)   . LBP (low back pain)   . Menopause   . Osteoporosis   . Ovarian cancer ([place]) 09/2008   Dr Gwyneth Revels  . Pancreatic cyst   . Vitamin B12 deficiency   . Vitamin D deficiency     Past Surgical History:  Procedure Laterality Date  . ABDOMINAL HYSTERECTOMY    . APPENDECTOMY    . CHOLECYSTECTOMY N/A 05/15/2017   Procedure: LAPAROSCOPIC CHOLECYSTECTOMY WITH INTRAOPERATIVE CHOLANGIOGRAM AND LYSIS OF ADHESIONS;  Surgeon: Fanny Skates, MD;  Location: [facility];  Service: General;  Laterality: N/A;  . ELBOW FRACTURE SURGERY     age 20- left elbow  . [place]  2001  . Ovarian Cancer [place]  09/2008    Family History  Problem Relation Age of Onset  . Alzheimer's disease Mother 26  . Other Mother 34       TAH for excessive bleeding  . Lung cancer Father        smoker; metastasis to stomach and other areas  . Heart attack Maternal Uncle   . Other Paternal Aunt        stomach issues  . Heart Problems Paternal Uncle   . Other Maternal Grandmother        stomach issues; +hysterectomy  . Heart attack Maternal Grandfather   . Infertility Daughter   . Stomach cancer Cousin        dx. mid-60s  . Other Cousin        stomach issues  . Leukemia Cousin 18  . Stomach cancer Other   . Cancer Cousin        unknown type  . Other Cousin        stomach issues  . Heart Problems Maternal Uncle   . Diabetes Paternal Aunt   . Heart Problems Paternal Uncle   . Emphysema Paternal Uncle        work exposure  . Breast cancer Cousin        dx. late 60s-early 70s  . Colon cancer Neg Hx   . Rectal cancer Neg Hx   . Esophageal cancer Neg Hx     Social History:  reports that she has never smoked.  She has never used smokeless tobacco. She reports that she does not drink alcohol and does not use drugs.    Review of Systems     Lipid history: She has not been on any statin drugs, lipids to be followed up with PCP    Lab Results  Component Value Date   CHOL 169 11/26/2019   HDL 41.90 11/26/2019   LDLCALC 100 (H) 11/26/2019   TRIG 136.0 11/26/2019   CHOLHDL 4 11/26/2019         RETINOPATHY: She is followed by ophthalmologist for background retinopathy  Hypertension followed by PCP and nephrologist  She is on amlodipine and Inderal She does monitor at home   BP Readings from Last 3 Encounters:  07/01/20 130/84  05/13/20 (!) 152/66  02/26/20 132/82     HYPOKALEMIA: This appears to be from her chronic diarrhea, not on diuretics, taking Citrucel for diarrhea  Has been prescribed potassium supplements This is followed by her PCP  Lab Results  Component Value Date   CREATININE 1.13 06/29/2020   BUN 23 06/29/2020   NA 142 06/29/2020   K 3.5 06/29/2020   CL 105 06/29/2020   CO2 28 06/29/2020     Review of Systems  LABS:  Lab on 06/29/2020  Component Date Value Ref Range Status  . Sodium 06/29/2020 142  135 - 145 mEq/L Final  . Potassium 06/29/2020 3.5  3.5 - 5.1 mEq/L Final  . Chloride 06/29/2020 105  96 - 112 mEq/L Final  . CO2 06/29/2020 28  19 - 32 mEq/L Final  . Glucose, Bld 06/29/2020 100* 70 - 99 mg/dL Final  . BUN 06/29/2020 23  6 - 23 mg/dL Final  . Creatinine, Ser 06/29/2020 1.13  0.40 - 1.20 mg/dL Final  . GFR 06/29/2020 47.91* >60.00 mL/min Final  . Calcium 06/29/2020 9.5  8.4 - 10.5 mg/dL Final  . Hgb A1c MFr Bld 06/29/2020 5.7  4.6 - 6.5 % Final   Glycemic Control Guidelines for People with Diabetes:Non Diabetic:  <6%Goal of Therapy: <7%Additional Action Suggested:  >8%     Physical Examination:  BP 130/84   Pulse (!) 58   Ht '5\' 4"'  (1.626 m)   Wt 159 lb (72.1 kg)   SpO2 99%   BMI 27.29 kg/m         ASSESSMENT/PLAN:  DIABETES  with mild obesity and history of mild retinopathy:  She has mild diabetes based on fasting blood sugars over 130  Although A1c is back down to 5.7   Fasting glucose is now improved at 100 She has excellent readings at home with average about 128 but mostly checking after meals She has maintained her weight although she has not really been exercising Previously fasting readings have been higher and she needs to check these periodically also  Again encouraged her to start adding exercise to her weight loss regimen with regular walking as tolerated  Continue Jardiance 10 mg daily and follow-up in 4 months unless blood sugars are higher  Elayne Snare 07/01/2020, 11:37 AM   Note: This office note was prepared with Estate agent. Any transcriptional errors that result from this process are unintentional.   Flu vaccine given

## 2020-07-09 DIAGNOSIS — D224 Melanocytic nevi of scalp and neck: Secondary | ICD-10-CM | POA: Diagnosis not present

## 2020-07-09 DIAGNOSIS — D1801 Hemangioma of skin and subcutaneous tissue: Secondary | ICD-10-CM | POA: Diagnosis not present

## 2020-07-09 DIAGNOSIS — L814 Other melanin hyperpigmentation: Secondary | ICD-10-CM | POA: Diagnosis not present

## 2020-07-09 DIAGNOSIS — L821 Other seborrheic keratosis: Secondary | ICD-10-CM | POA: Diagnosis not present

## 2020-07-09 DIAGNOSIS — Z85828 Personal history of other malignant neoplasm of skin: Secondary | ICD-10-CM | POA: Diagnosis not present

## 2020-07-09 DIAGNOSIS — D225 Melanocytic nevi of trunk: Secondary | ICD-10-CM | POA: Diagnosis not present

## 2020-07-09 DIAGNOSIS — L820 Inflamed seborrheic keratosis: Secondary | ICD-10-CM | POA: Diagnosis not present

## 2020-07-14 ENCOUNTER — Encounter: Payer: Self-pay | Admitting: Internal Medicine

## 2020-07-27 ENCOUNTER — Other Ambulatory Visit: Payer: Self-pay | Admitting: Endocrinology

## 2020-08-06 ENCOUNTER — Telehealth: Payer: Self-pay | Admitting: Endocrinology

## 2020-08-06 ENCOUNTER — Other Ambulatory Visit: Payer: Self-pay | Admitting: *Deleted

## 2020-08-06 MED ORDER — ONETOUCH DELICA LANCETS 30G MISC
2 refills | Status: DC
Start: 2020-08-06 — End: 2022-04-13

## 2020-08-06 NOTE — Telephone Encounter (Signed)
Rx has been sent for a 90 day supply

## 2020-08-06 NOTE — Telephone Encounter (Signed)
Patient's husband requests the RX for a 90 day supply of OneTouch Delica Lancets 95K MISC be resent due to [facility] stated they never received the RX to  [id number] - , [place] Phone:  [phone]  Fax:  [phone]

## 2020-08-13 ENCOUNTER — Other Ambulatory Visit: Payer: Self-pay | Admitting: Internal Medicine

## 2020-08-13 ENCOUNTER — Ambulatory Visit (AMBULATORY_SURGERY_CENTER): Payer: Self-pay | Admitting: *Deleted

## 2020-08-13 ENCOUNTER — Other Ambulatory Visit: Payer: Self-pay

## 2020-08-13 VITALS — Ht 64.0 in | Wt 159.0 lb

## 2020-08-13 DIAGNOSIS — Z86010 Personal history of colonic polyps: Secondary | ICD-10-CM

## 2020-08-13 MED ORDER — NA SULFATE-K SULFATE-MG SULF 17.5-3.13-1.6 GM/177ML PO SOLN: ORAL | 0 refills | Status: DC

## 2020-08-13 NOTE — Progress Notes (Signed)
Patient is here in-person for PV. Patient denies any allergies to eggs or soy. Patient denies any problems with anesthesia/sedation. Patient denies any oxygen use at home. Patient denies taking any diet/weight loss medications or blood thinners. Patient is not being treated for MRSA or C-diff. Patient is aware of our care-partner policy and [id number] safety protocol.   COVID-19 vaccines completed on 06/02/2020 booster, per patient.

## 2020-08-19 DIAGNOSIS — N3281 Overactive bladder: Secondary | ICD-10-CM | POA: Diagnosis not present

## 2020-08-19 DIAGNOSIS — R808 Other proteinuria: Secondary | ICD-10-CM | POA: Diagnosis not present

## 2020-08-21 ENCOUNTER — Ambulatory Visit (INDEPENDENT_AMBULATORY_CARE_PROVIDER_SITE_OTHER): Payer: Medicare Other

## 2020-08-21 ENCOUNTER — Telehealth: Payer: Self-pay | Admitting: Gastroenterology

## 2020-08-21 ENCOUNTER — Other Ambulatory Visit: Payer: Self-pay

## 2020-08-21 DIAGNOSIS — M810 Age-related osteoporosis without current pathological fracture: Secondary | ICD-10-CM | POA: Diagnosis not present

## 2020-08-21 MED ORDER — DENOSUMAB 60 MG/ML ~~LOC~~ SOSY
60.0000 mg | PREFILLED_SYRINGE | Freq: Once | SUBCUTANEOUS | Status: AC
  Administered 2020-08-21: 11:00:00 60 mg via SUBCUTANEOUS

## 2020-08-21 NOTE — Telephone Encounter (Signed)
Sent patient a Pharmacist, community message clarifying what she needed regarding the new medication she just started

## 2020-08-31 NOTE — Progress Notes (Addendum)
Prolia given.  Medical screening examination/treatment/procedure(s) were performed by non-physician practitioner and as supervising physician I was immediately available for consultation/collaboration.  I agree with above. Lew Dawes, MD

## 2020-09-08 ENCOUNTER — Ambulatory Visit (AMBULATORY_SURGERY_CENTER): Payer: Medicare Other | Admitting: Internal Medicine

## 2020-09-08 ENCOUNTER — Other Ambulatory Visit: Payer: Self-pay

## 2020-09-08 ENCOUNTER — Encounter: Payer: Self-pay | Admitting: Internal Medicine

## 2020-09-08 VITALS — BP 117/68 | HR 54 | Temp 97.7°F | Resp 13 | Ht 64.0 in | Wt 159.0 lb

## 2020-09-08 DIAGNOSIS — D122 Benign neoplasm of ascending colon: Secondary | ICD-10-CM

## 2020-09-08 DIAGNOSIS — Z860100 Personal history of colon polyps, unspecified: Secondary | ICD-10-CM

## 2020-09-08 DIAGNOSIS — D126 Benign neoplasm of colon, unspecified: Secondary | ICD-10-CM | POA: Diagnosis not present

## 2020-09-08 DIAGNOSIS — D123 Benign neoplasm of transverse colon: Secondary | ICD-10-CM

## 2020-09-08 MED ORDER — SODIUM CHLORIDE 0.9 % IV SOLN
500.0000 mL | INTRAVENOUS | Status: DC
Start: 2020-09-08 — End: 2020-11-12

## 2020-09-08 NOTE — Patient Instructions (Signed)
Resume previous medications.  Await pathology for final recommendations.  Handouts on findings given to patient.    YOU HAD AN ENDOSCOPIC PROCEDURE TODAY AT THE [facility]:   Refer to the procedure report that was given to you for any specific questions about what was found during the examination.  If the procedure report does not answer your questions, please call your gastroenterologist to clarify.  If you requested that your care partner not be given the details of your procedure findings, then the procedure report has been included in a sealed envelope for you to review at your convenience later.  YOU SHOULD EXPECT: Some feelings of bloating in the abdomen. Passage of more gas than usual.  Walking can help get rid of the air that was put into your GI tract during the procedure and reduce the bloating. If you had a lower endoscopy (such as a colonoscopy or flexible sigmoidoscopy) you may notice spotting of blood in your stool or on the toilet paper. If you underwent a bowel prep for your procedure, you may not have a normal bowel movement for a few days.  Please Note:  You might notice some irritation and congestion in your nose or some drainage.  This is from the oxygen used during your procedure.  There is no need for concern and it should clear up in a day or so.  SYMPTOMS TO REPORT IMMEDIATELY:   Following lower endoscopy (colonoscopy or flexible sigmoidoscopy):  Excessive amounts of blood in the stool  Significant tenderness or worsening of abdominal pains  Swelling of the abdomen that is new, acute  Fever of 100F or higher   For urgent or emergent issues, a gastroenterologist can be reached at any hour by calling [phone]. Do not use MyChart messaging for urgent concerns.    DIET:  We do recommend a small meal at first, but then you may proceed to your regular diet.  Drink plenty of fluids but you should avoid alcoholic beverages for 24 hours.  ACTIVITY:  You  should plan to take it easy for the rest of today and you should NOT DRIVE or use heavy machinery until tomorrow (because of the sedation medicines used during the test).    FOLLOW UP: Our staff will call the number listed on your records 48-72 hours following your procedure to check on you and address any questions or concerns that you may have regarding the information given to you following your procedure. If we do not reach you, we will leave a message.  We will attempt to reach you two times.  During this call, we will ask if you have developed any symptoms of COVID 19. If you develop any symptoms (ie: fever, flu-like symptoms, shortness of breath, cough etc.) before then, please call [phone].  If you test positive for Covid 19 in the 2 weeks post procedure, please call and report this information to us.    If any biopsies were taken you will be contacted by phone or by letter within the next 1-3 weeks.  Please call us at [phone] if you have not heard about the biopsies in 3 weeks.    SIGNATURES/CONFIDENTIALITY: You and/or your care partner have signed paperwork which will be entered into your electronic medical record.  These signatures attest to the fact that that the information above on your After Visit Summary has been reviewed and is understood.  Full responsibility of the confidentiality of this discharge information lies with you and/or your 

## 2020-09-08 NOTE — Op Note (Signed)
[facility] Patient Name: [name] Procedure Date: 09/08/2020 11:33 AM MRN: [id number] Endoscopist: Wilhemina Bonito. Marina Goodell , MD Age: 75 Referring MD:  Date of Birth: 1946/01/17 Gender: Female Account #: [id number] Procedure:                Colonoscopy with cold snare polypectomy x 1; with                            biopsies Indications:              High risk colon cancer surveillance: Personal                            history of adenoma (10 mm or greater in size), High                            risk colon cancer surveillance: Personal history of                            multiple (3 or more) adenomas. Previous                            examinations 2001 (Dr. Corinda Gubler), 2005, 2007, 2012                            (Dr. Juanda Chance), 2017 (piecemeal transverse colon                            polyp, tattoo), 2018 Medicines:                Monitored Anesthesia Care Procedure:                Pre-Anesthesia Assessment:                           - Prior to the procedure, a History and Physical                            was performed, and patient medications and                            allergies were reviewed. The patient's tolerance of                            previous anesthesia was also reviewed. The risks                            and benefits of the procedure and the sedation                            options and risks were discussed with the patient.                            All questions were answered, and informed consent  was obtained. Prior Anticoagulants: The patient has                            taken no previous anticoagulant or antiplatelet                            agents. ASA Grade Assessment: II - A patient with                            mild systemic disease. After reviewing the risks                            and benefits, the patient was deemed in                            satisfactory condition to undergo the procedure.                            After obtaining informed consent, the colonoscope                            was passed under direct vision. Throughout the                            procedure, the patient's blood pressure, pulse, and                            oxygen saturations were monitored continuously. The                            Olympus CF-HQ190L (Serial# [id number]) Colonoscope was                            introduced through the anus and advanced to the the                            cecum, identified by appendiceal orifice and                            ileocecal valve. The ileocecal valve, appendiceal                            orifice, and rectum were photographed. The quality                            of the bowel preparation was excellent. The                            colonoscopy was performed without difficulty. The                            patient tolerated the procedure well. The bowel  preparation used was SUPREP via split dose                            instruction. Scope In: 11:47:55 AM Scope Out: 12:05:09 PM Scope Withdrawal Time: 0 hours 12 minutes 37 seconds  Total Procedure Duration: 0 hours 17 minutes 14 seconds  Findings:                 A 3 mm polyp was found in the ascending colon. The                            polyp was removed with a cold snare. Resection and                            retrieval were complete.                           Prior marking tattoo and polypectomy site with scar                            identified in the proximal transverse colon. This                            was examined under white light and NBI. Was one                            diminutive area of questionable adenomatous tissue                            which was cold biopsy removed. See images. The                            entire examined colon appeared otherwise normal on                            direct and retroflexion views. Complications:             No immediate complications. Estimated blood loss:                            None. Estimated Blood Loss:     Estimated blood loss: none. Impression:               - One 3 mm polyp in the ascending colon, removed                            with a cold snare. Resected and retrieved.                           - Prior resection site and tattoo as described.                            Status post biopsies                           -  The entire examined colon is normal on direct and                            retroflexion views. Recommendation:           - Repeat colonoscopy in 3 - 5 years for                            surveillance, based on final pathology.                           - Patient has a contact number available for                            emergencies. The signs and symptoms of potential                            delayed complications were discussed with the                            patient. Return to normal activities tomorrow.                            Written discharge instructions were provided to the                            patient.                           - Resume previous diet.                           - Continue present medications.                           - Await pathology results. Docia Chuck. Henrene Pastor, MD 09/08/2020 12:16:46 PM This report has been signed electronically.

## 2020-09-08 NOTE — Progress Notes (Signed)
PT taken to PACU. Monitors in place. VSS. Report given to RN. 

## 2020-09-08 NOTE — Progress Notes (Signed)
Called to room to assist during endoscopic procedure.  Patient ID and intended procedure confirmed with present staff. Received instructions for my participation in the procedure from the performing physician.  

## 2020-09-10 ENCOUNTER — Telehealth: Payer: Self-pay

## 2020-09-10 NOTE — Telephone Encounter (Signed)
  Follow up Call-  Call back number 09/08/2020  Post procedure Call Back phone  # [phone]  Permission to leave phone message Yes  Some recent data might be hidden     Patient questions:  Do you have a fever, pain , or abdominal swelling? No. Pain Score  0 *  Have you tolerated food without any problems? Yes.    Have you been able to return to your normal activities? Yes.    Do you have any questions about your discharge instructions: Diet   No. Medications  No. Follow up visit  No.  Do you have questions or concerns about your Care? No.  Actions: * If pain score is 4 or above: No action needed, pain <4. 1. Have you developed a fever since your procedure? no  2.   Have you had an respiratory symptoms (SOB or cough) since your procedure? no  3.   Have you tested positive for COVID 19 since your procedure no  4.   Have you had any family members/close contacts diagnosed with the COVID 19 since your procedure?  no   If yes to any of these questions please route to Laverna Peace, RN and Karlton Lemon, RN

## 2020-09-11 ENCOUNTER — Encounter: Payer: Self-pay | Admitting: Internal Medicine

## 2020-10-01 DIAGNOSIS — N3941 Urge incontinence: Secondary | ICD-10-CM | POA: Diagnosis not present

## 2020-10-01 DIAGNOSIS — N3281 Overactive bladder: Secondary | ICD-10-CM | POA: Diagnosis not present

## 2020-10-05 ENCOUNTER — Telehealth: Payer: Self-pay | Admitting: Endocrinology

## 2020-10-05 NOTE — Telephone Encounter (Signed)
Patient had labs done at another Dr and they called her with her results and her urine glucose level was abnormal, patient would like to speak to Dr Dwyane Dee or nurse regarding this. Ph# [phone]

## 2020-10-07 ENCOUNTER — Other Ambulatory Visit: Payer: Self-pay | Admitting: Internal Medicine

## 2020-10-07 NOTE — Telephone Encounter (Signed)
No answer when I called back.[name]

## 2020-10-15 ENCOUNTER — Other Ambulatory Visit: Payer: Self-pay | Admitting: Internal Medicine

## 2020-10-16 ENCOUNTER — Other Ambulatory Visit: Payer: Self-pay | Admitting: Endocrinology

## 2020-11-06 ENCOUNTER — Other Ambulatory Visit: Payer: Self-pay

## 2020-11-06 ENCOUNTER — Other Ambulatory Visit (INDEPENDENT_AMBULATORY_CARE_PROVIDER_SITE_OTHER): Payer: Medicare Other

## 2020-11-06 DIAGNOSIS — E669 Obesity, unspecified: Secondary | ICD-10-CM | POA: Diagnosis not present

## 2020-11-06 DIAGNOSIS — E1169 Type 2 diabetes mellitus with other specified complication: Secondary | ICD-10-CM | POA: Diagnosis not present

## 2020-11-06 LAB — COMPREHENSIVE METABOLIC PANEL WITH GFR
ALT: 14 U/L (ref 0–35)
AST: 18 U/L (ref 0–37)
Albumin: 4.2 g/dL (ref 3.5–5.2)
Alkaline Phosphatase: 52 U/L (ref 39–117)
BUN: 20 mg/dL (ref 6–23)
CO2: 26 meq/L (ref 19–32)
Calcium: 9.2 mg/dL (ref 8.4–10.5)
Chloride: 107 meq/L (ref 96–112)
Creatinine, Ser: 1.01 mg/dL (ref 0.40–1.20)
GFR: 54.69 mL/min — ABNORMAL LOW
Glucose, Bld: 103 mg/dL — ABNORMAL HIGH (ref 70–99)
Potassium: 3.3 meq/L — ABNORMAL LOW (ref 3.5–5.1)
Sodium: 143 meq/L (ref 135–145)
Total Bilirubin: 0.6 mg/dL (ref 0.2–1.2)
Total Protein: 7.4 g/dL (ref 6.0–8.3)

## 2020-11-06 LAB — LIPID PANEL
Cholesterol: 163 mg/dL (ref 0–200)
HDL: 49.7 mg/dL (ref >39.00)
LDL Cholesterol: 91 mg/dL (ref 0–99)
NonHDL: 113.65
Total CHOL/HDL Ratio: 3
Triglycerides: 113 mg/dL (ref 0.0–149.0)
VLDL: 22.6 mg/dL (ref 0.0–40.0)

## 2020-11-06 LAB — HEMOGLOBIN A1C: Hgb A1c MFr Bld: 5.6 % (ref 4.6–6.5)

## 2020-11-09 ENCOUNTER — Other Ambulatory Visit: Payer: Medicare Other

## 2020-11-11 ENCOUNTER — Encounter: Payer: Self-pay | Admitting: Endocrinology

## 2020-11-11 ENCOUNTER — Other Ambulatory Visit: Payer: Self-pay

## 2020-11-11 ENCOUNTER — Ambulatory Visit (INDEPENDENT_AMBULATORY_CARE_PROVIDER_SITE_OTHER): Payer: Medicare Other | Admitting: Endocrinology

## 2020-11-11 VITALS — BP 122/88 | HR 53 | Ht 64.0 in | Wt 158.0 lb

## 2020-11-11 DIAGNOSIS — E785 Hyperlipidemia, unspecified: Secondary | ICD-10-CM

## 2020-11-11 DIAGNOSIS — E1169 Type 2 diabetes mellitus with other specified complication: Secondary | ICD-10-CM

## 2020-11-11 DIAGNOSIS — E669 Obesity, unspecified: Secondary | ICD-10-CM

## 2020-11-11 NOTE — Progress Notes (Signed)
Patient ID: [name], female   DOB: 28-Sep-1945, 74 y.o.   MRN: [id number]            Reason for Appointment: Follow up of diabetes  Primary care physician: [name]  History of Present Illness:      Previously had A1c levels in the prediabetic range and had been at baseline upper normal around 6.0  She was evaluated with a glucose tolerance test in 10/2015 which showed the following: Fasting glucose 122, one-hour glucose 202 hour glucose 147  She was referred to the dietitian for meal planning and advised weight loss  Recent history:  Her A1c has been ranging between 5.7 -6.4 mostly  This is now  5.6  Current medications for diabetes: Jardiance 10 mg daily, started in 11/20  With her having high blood sugars over 125 fasting she was diagnosed with overt diabetes Also has history of background retinopathy   She was continued on Jardiance 10 mg without side effects  With this she has been able to keep her weight down  No side effects with this such as yeast infections  Has checked her blood sugars but mostly late morning and midday, not clear which readings are fasting  Generally eating oatmeal at breakfast and not always getting eggs for protein, highest reading 159 after breakfast  Usually watching portions in her diet and snacks  She is not motivated to exercise again  Last nutritional consultation was in 04/2018   PRE-MEAL  morning  10 AM +  PC dinner Bedtime Overall  Glucose range:     124 91-159  Mean/median:  106  122  127  123   Previously:  PRE-MEAL Fasting Lunch Dinner Bedtime Overall  Glucose range:  121     101-156  Mean/median:   120    128   POST-MEAL PC Breakfast PC Lunch PC Dinner  Glucose range:  101-144   123-156  Mean/median:  123   138    Weight history:  Wt Readings from Last 3 Encounters:  11/11/20 158 lb (71.7 kg)  09/08/20 159 lb (72.1 kg)  08/13/20 159 lb (72.1 kg)    Glycemic levels:   Lab Results  Component Value  Date   HGBA1C 5.6 11/06/2020   HGBA1C 5.7 06/29/2020   HGBA1C 6.0 02/19/2020   Lab Results  Component Value Date   MICROALBUR 8.3 (H) 02/19/2020   LDLCALC 91 11/06/2020   CREATININE 1.01 11/06/2020    Other problems: See review of systems   Allergies as of 11/11/2020      Reactions   Actonel [risedronate Sodium]    Upset stomach   Boniva [ibandronate Sodium]    cramp   Calcium Channel Blockers    Upset stomach   Chlorthalidone    Diarrhea per pt   Compazine    Daughter reacts to compazine/pt does not want to take   Lyrica [pregabalin]    Dizzy   Tape    redness      Medication List       Accurate as of November 11, 2020  1:09 PM. If you have any questions, ask your nurse or doctor.        STOP taking these medications   Myrbetriq 50 MG Tb24 tablet Generic drug: mirabegron ER Stopped by: Elayne Snare, MD     TAKE these medications   amLODipine 5 MG tablet Commonly known as: NORVASC Take 1 tablet (5 mg total) by mouth daily.   aspirin 81 MG EC  tablet Take 81 mg by mouth daily.   bimatoprost 0.01 % Soln Commonly known as: LUMIGAN Place 1 drop into both eyes at bedtime.   CITRUCEL PO Take 17 g by mouth daily. Mix 17grams (1 tbsp) with 3-6 tbsp of water and drink daily.   denosumab 60 MG/ML Soln injection Commonly known as: PROLIA Inject 60 mg into the skin every 6 (six) months. Administer in upper arm, thigh, or abdomen   diphenoxylate-atropine 2.5-0.025 MG tablet Commonly known as: LOMOTIL TAKE 1 TABLET BY MOUTH FOUR TIMES A DAY AS NEEDED FOR DIARRHEA OR LOOSE STOOLS   furosemide 20 MG tablet Commonly known as: LASIX Take 0.5-1 tablets (10-20 mg total) by mouth daily as needed for edema (for leg swelling).   ipratropium 0.06 % nasal spray Commonly known as: ATROVENT Place 2 sprays into the nose 3 (three) times daily.   Jardiance 10 MG Tabs tablet Generic drug: empagliflozin TAKE 1 TABLET (10 MG TOTAL) BY MOUTH DAILY WITH BREAKFAST.   Na Sulfate-K  Sulfate-Mg Sulf 17.5-3.13-1.6 GM/177ML Soln Suprep (no substitutions)-TAKE AS DIRECTED.   Nascobal 500 MCG/0.1ML Soln Generic drug: Cyanocobalamin USE 1 SPRAY IN 1 NOSTRIL  ONCE A WEEK   OneTouch Delica Lancets 65K Misc USE AS DIRECTED TO CHECK BLOOD SUGAR IN THE MORNING, AT NOON, AND AT BEDTIME   OneTouch Verio Flex System w/Device Kit 1 each by Does not apply route in the morning, at noon, and at bedtime.   OneTouch Verio test strip Generic drug: glucose blood USE ONETOUCH VERIO TEST STRIPS AS INSTRUCTED TO CHECK BLOOD SUGAR THREE TIMES DAILY.   potassium chloride SA 20 MEQ tablet Commonly known as: KLOR-CON Take 1 tablet (20 mEq total) by mouth daily.   propranolol ER 60 MG 24 hr capsule Commonly known as: INDERAL LA Take 1 capsule (60 mg total) by mouth 2 (two) times daily.   RABEprazole 20 MG tablet Commonly known as: ACIPHEX TAKE 1 TABLET BY MOUTH EVERY DAY   timolol 0.5 % ophthalmic gel-forming Commonly known as: TIMOPTIC-XR Place 1 drop into both eyes daily.   Timolol Maleate (Once-Daily) 0.5 % Soln   triamcinolone 0.1 % Commonly known as: KENALOG Apply 1 application topically 2 (two) times daily as needed (ear bacteria).   Vitamin D3 1.25 MG (50000 UT) Caps TAKE ONE CAPSULE BY MOUTH EVERY 30 DAYS       Allergies:  Allergies  Allergen Reactions  . Actonel [Risedronate Sodium]     Upset stomach  . Boniva [Ibandronate Sodium]     cramp  . Calcium Channel Blockers     Upset stomach  . Chlorthalidone     Diarrhea per pt  . Compazine     Daughter reacts to compazine/pt does not want to take  . Lyrica [Pregabalin]     Dizzy   . Tape     redness    Past Medical History:  Diagnosis Date  . Adenomatous colon polyp 04/08/2011  . Anemia   . Anxiety   . Cataract   . Diabetes mellitus without complication ([place])    "pre-diabetic" per pt  . Essential hypertension 08/10/2007   Chronic  On Catapress (per Dr Ubaldo Glassing) - increase to bid - d/c Hydralazine prn  per Dr Caryl Comes d/c 3/19: Increase Inderal LA to bid  . GERD (gastroesophageal reflux disease)   . Glaucoma (increased eye pressure)   . Heart murmur   . HTN (hypertension)   . IBS (irritable bowel syndrome)   . LBP (low back pain)   .  Menopause   . Osteoporosis   . Ovarian cancer ([place]) 09/2008   Dr Gwyneth Revels  . Pancreatic cyst   . Vitamin B12 deficiency   . Vitamin D deficiency     Past Surgical History:  Procedure Laterality Date  . ABDOMINAL HYSTERECTOMY    . APPENDECTOMY    . BLADDER SURGERY  04/09/2020   tack and sling  . CHOLECYSTECTOMY N/A 05/15/2017   Procedure: LAPAROSCOPIC CHOLECYSTECTOMY WITH INTRAOPERATIVE CHOLANGIOGRAM AND LYSIS OF ADHESIONS;  Surgeon: Fanny Skates, MD;  Location: [facility]S;  Service: General;  Laterality: N/A;  . COLONOSCOPY  2018   Dr.Perry  . ELBOW FRACTURE SURGERY     age 51- left elbow  . [place]  2001  . Ovarian Cancer [place]bul[place]  09/2008  . POLYPECTOMY      Family History  Problem Relation Age of Onset  . Alzheimer's disease Mother 64  . Other Mother 16       TAH for excessive bleeding  . Lung cancer Father        smoker; metastasis to stomach and other areas  . Heart attack Maternal Uncle   . Other Paternal Aunt        stomach issues  . Heart Problems Paternal Uncle   . Other Maternal Grandmother        stomach issues; +hysterectomy  . Heart attack Maternal Grandfather   . Infertility Daughter   . Stomach cancer Cousin        dx. mid-60s  . Other Cousin        stomach issues  . Leukemia Cousin 18  . Stomach cancer Other   . Cancer Cousin        unknown type  . Other Cousin        stomach issues  . Heart Problems Maternal Uncle   . Diabetes Paternal Aunt   . Heart Problems Paternal Uncle   . Emphysema Paternal Uncle        work exposure  . Breast cancer Cousin        dx. late 60s-early 70s  . Colon cancer Neg Hx   . Rectal cancer Neg Hx   . Esophageal cancer Neg Hx     Social History:  reports that she has  never smoked. She has never used smokeless tobacco. She reports that she does not drink alcohol and does not use drugs.    Review of Systems     Lipid history: She has not been on any statin drugs, lipids to be followed up with PCP    Lab Results  Component Value Date   CHOL 163 11/06/2020   HDL 49.70 11/06/2020   LDLCALC 91 11/06/2020   TRIG 113.0 11/06/2020   CHOLHDL 3 11/06/2020         RETINOPATHY: She is followed by ophthalmologist for background retinopathy  Hypertension followed by PCP and nephrologist  She is on amlodipine and Inderal She does monitor at home   BP Readings from Last 3 Encounters:  11/11/20 122/88  09/08/20 117/68  07/01/20 130/84     HYPOKALEMIA: Has not taken her potassium supplements regularly  Has been prescribed potassium supplements by her PCP   Lab Results  Component Value Date   CREATININE 1.01 11/06/2020   BUN 20 11/06/2020   NA 143 11/06/2020   K 3.3 (L) 11/06/2020   CL 107 11/06/2020   CO2 26 11/06/2020     Review of Systems   LABS:  Lab on 11/06/2020  Component Date  Value Ref Range Status  . Cholesterol 11/06/2020 163  0 - 200 mg/dL Final   ATP III Classification       Desirable:  < 200 mg/dL               Borderline High:  200 - 239 mg/dL          High:  > = 240 mg/dL  . Triglycerides 11/06/2020 113.0  0.0 - 149.0 mg/dL Final   Normal:  <150 mg/dLBorderline High:  150 - 199 mg/dL  . HDL 11/06/2020 49.70  >39.00 mg/dL Final  . VLDL 11/06/2020 22.6  0.0 - 40.0 mg/dL Final  . LDL Cholesterol 11/06/2020 91  0 - 99 mg/dL Final  . Total CHOL/HDL Ratio 11/06/2020 3   Final                  Men          Women1/2 Average Risk     3.4          3.3Average Risk          5.0          4.42X Average Risk          9.6          7.13X Average Risk          15.0          11.0                      . NonHDL 11/06/2020 113.65   Final   NOTE:  Non-HDL goal should be 30 mg/dL higher than patient's LDL goal (i.e. LDL goal of < 70 mg/dL,  would have non-HDL goal of < 100 mg/dL)  . Sodium 11/06/2020 143  135 - 145 mEq/L Final  . Potassium 11/06/2020 3.3* 3.5 - 5.1 mEq/L Final  . Chloride 11/06/2020 107  96 - 112 mEq/L Final  . CO2 11/06/2020 26  19 - 32 mEq/L Final  . Glucose, Bld 11/06/2020 103* 70 - 99 mg/dL Final  . BUN 11/06/2020 20  6 - 23 mg/dL Final  . Creatinine, Ser 11/06/2020 1.01  0.40 - 1.20 mg/dL Final  . Total Bilirubin 11/06/2020 0.6  0.2 - 1.2 mg/dL Final  . Alkaline Phosphatase 11/06/2020 52  39 - 117 U/L Final  . AST 11/06/2020 18  0 - 37 U/L Final  . ALT 11/06/2020 14  0 - 35 U/L Final  . Total Protein 11/06/2020 7.4  6.0 - 8.3 g/dL Final  . Albumin 11/06/2020 4.2  3.5 - 5.2 g/dL Final  . GFR 11/06/2020 54.69* >60.00 mL/min Final   Calculated using the CKD-EPI Creatinine Equation (2021)  . Calcium 11/06/2020 9.2  8.4 - 10.5 mg/dL Final  . Hgb A1c MFr Bld 11/06/2020 5.6  4.6 - 6.5 % Final   Glycemic Control Guidelines for People with Diabetes:Non Diabetic:  <6%Goal of Therapy: <7%Additional Action Suggested:  >8%     Physical Examination:  BP 122/88   Pulse (!) 53   Ht '5\' 4"'  (1.626 m)   Wt 158 lb (71.7 kg)   SpO2 99%   BMI 27.12 kg/m     No ankle edema present    ASSESSMENT/PLAN:  DIABETES with mild obesity and history of mild retinopathy:  She has mild diabetes based on fasting blood sugars over 130  A1c is down to 5.6   Fasting glucose is now 103 Overall doing well with Jardiance and highest reading  at home was 159 Although she is not motivated to exercise she thinks she can start a little walking now Also usually her diet is fairly good but discussed adding protein to each meal 100   Continue Jardiance 10 mg daily and follow-up in 4 months unless blood sugars are higher  Reassured her that if she has any glycosuria this is from the medication and is expected, discussed mechanism of action of Jardiance  Lipids: Not abnormal with LDL 91, however with her family history of coronary  disease and her having diabetes and hypertension would consider statin drug, she will discuss with her PCP also, advised her that this would be beneficial long-term  Total visit time including counseling =30 minutes  Elayne Snare 11/11/2020, 1:09 PM   Note: This office note was prepared with Dragon voice recognition system technology. Any transcriptional errors that result from this process are unintentional.   Flu vaccine given

## 2020-11-11 NOTE — Patient Instructions (Signed)
Take potassium regularly

## 2020-11-12 ENCOUNTER — Ambulatory Visit (INDEPENDENT_AMBULATORY_CARE_PROVIDER_SITE_OTHER): Payer: Medicare Other | Admitting: Internal Medicine

## 2020-11-12 ENCOUNTER — Other Ambulatory Visit: Payer: Self-pay

## 2020-11-12 ENCOUNTER — Encounter: Payer: Self-pay | Admitting: Internal Medicine

## 2020-11-12 DIAGNOSIS — E669 Obesity, unspecified: Secondary | ICD-10-CM

## 2020-11-12 DIAGNOSIS — E785 Hyperlipidemia, unspecified: Secondary | ICD-10-CM

## 2020-11-12 DIAGNOSIS — D490 Neoplasm of unspecified behavior of digestive system: Secondary | ICD-10-CM | POA: Diagnosis not present

## 2020-11-12 DIAGNOSIS — E876 Hypokalemia: Secondary | ICD-10-CM

## 2020-11-12 DIAGNOSIS — I10 Essential (primary) hypertension: Secondary | ICD-10-CM

## 2020-11-12 DIAGNOSIS — R55 Syncope and collapse: Secondary | ICD-10-CM

## 2020-11-12 DIAGNOSIS — E1169 Type 2 diabetes mellitus with other specified complication: Secondary | ICD-10-CM

## 2020-11-12 NOTE — Assessment & Plan Note (Addendum)
Discussed Coronary calcium CT score is 0 10/20 BP has been controlled Norvasc at HS, Propranolol Lasix prn

## 2020-11-12 NOTE — Progress Notes (Signed)
Subjective:  Patient ID: [name], female    DOB: 16-May-1946  Age: 75 y.o. MRN: [id number]  CC: Follow-up (6 month f/u)   HPI [name] presents for HTN, DM 2, low K  Outpatient Medications Prior to Visit  Medication Sig Dispense Refill  . amLODipine (NORVASC) 5 MG tablet Take 1 tablet (5 mg total) by mouth daily. 90 tablet 3  . aspirin 81 MG EC tablet Take 81 mg by mouth daily.    . bimatoprost (LUMIGAN) 0.01 % SOLN Place 1 drop into both eyes at bedtime.    . Blood Glucose Monitoring Suppl (ONETOUCH VERIO FLEX SYSTEM) w/Device KIT 1 each by Does not apply route in the morning, at noon, and at bedtime.     . Cholecalciferol (VITAMIN D3) 1.25 MG (50000 UT) CAPS TAKE ONE CAPSULE BY MOUTH EVERY 30 DAYS 3 capsule 3  . Cyanocobalamin (NASCOBAL) 500 MCG/0.1ML SOLN USE 1 SPRAY IN 1 NOSTRIL  ONCE A WEEK 3 each 3  . denosumab (PROLIA) 60 MG/ML SOLN injection Inject 60 mg into the skin every 6 (six) months. Administer in upper arm, thigh, or abdomen    . diphenoxylate-atropine (LOMOTIL) 2.5-0.025 MG tablet TAKE 1 TABLET BY MOUTH FOUR TIMES A DAY AS NEEDED FOR DIARRHEA OR LOOSE STOOLS 60 tablet 3  . furosemide (LASIX) 20 MG tablet Take 0.5-1 tablets (10-20 mg total) by mouth daily as needed for edema (for leg swelling). 90 tablet 1  . ipratropium (ATROVENT) 0.06 % nasal spray Place 2 sprays into the nose 3 (three) times daily. (Patient not taking: Reported on 09/08/2020) 15 mL 2  . JARDIANCE 10 MG TABS tablet TAKE 1 TABLET (10 MG TOTAL) BY MOUTH DAILY WITH BREAKFAST. 90 tablet 0  . Methylcellulose, Laxative, (CITRUCEL PO) Take 17 g by mouth daily. Mix 17grams (1 tbsp) with 3-6 tbsp of water and drink daily.    Glory Rosebush Delica Lancets 29B MISC USE AS DIRECTED TO CHECK BLOOD SUGAR IN THE MORNING, AT NOON, AND AT BEDTIME 300 each 2  . ONETOUCH VERIO test strip USE ONETOUCH VERIO TEST STRIPS AS INSTRUCTED TO CHECK BLOOD SUGAR THREE TIMES DAILY. 300 strip 1  . potassium chloride SA  (KLOR-CON) 20 MEQ tablet Take 1 tablet (20 mEq total) by mouth daily. 90 tablet 3  . propranolol ER (INDERAL LA) 60 MG 24 hr capsule Take 1 capsule (60 mg total) by mouth 2 (two) times daily. 180 capsule 3  . RABEprazole (ACIPHEX) 20 MG tablet TAKE 1 TABLET BY MOUTH EVERY DAY 90 tablet 1  . Timolol Maleate 0.5 % (DAILY) SOLN     . triamcinolone cream (KENALOG) 0.1 % Apply 1 application topically 2 (two) times daily as needed (ear bacteria).    . Na Sulfate-K Sulfate-Mg Sulf 17.5-3.13-1.6 GM/177ML SOLN Suprep (no substitutions)-TAKE AS DIRECTED. (Patient not taking: Reported on 11/12/2020) 354 mL 0  . timolol (TIMOPTIC-XR) 0.5 % ophthalmic gel-forming Place 1 drop into both eyes daily.  (Patient not taking: Reported on 11/12/2020)    . 0.9 %  sodium chloride infusion      No facility-administered medications prior to visit.    ROS: Review of Systems  Constitutional: Positive for fatigue. Negative for activity change, appetite change, chills and unexpected weight change.  HENT: Negative for congestion, mouth sores and sinus pressure.   Eyes: Negative for visual disturbance.  Respiratory: Negative for cough and chest tightness.   Gastrointestinal: Negative for abdominal pain and nausea.  Genitourinary: Negative for difficulty urinating, frequency  and vaginal pain.  Musculoskeletal: Positive for arthralgias. Negative for back pain and gait problem.  Skin: Negative for pallor and rash.  Neurological: Negative for dizziness, tremors, weakness, numbness and headaches.  Psychiatric/Behavioral: Negative for confusion and sleep disturbance.    Objective:  BP 140/68 (BP Location: Left Arm)   Pulse 60   Temp 98.2 F (36.8 C) (Oral)   Ht _0  (1.626 m)   Wt 159 lb 3.2 oz (72.2 kg)   SpO2 97%   BMI 27.33 kg/m   BP Readings from Last 3 Encounters:  11/12/20 140/68  11/11/20 122/88  09/08/20 117/68    Wt Readings from Last 3 Encounters:  11/12/20 159 lb 3.2 oz (72.2 kg)  11/11/20 158 lb  (71.7 kg)  09/08/20 159 lb (72.1 kg)    Physical Exam  Lab Results  Component Value Date   WBC 6.3 04/02/2018   HGB 11.2 (L) 04/02/2018   HCT 33.0 (L) 04/02/2018   PLT 193 04/02/2018   GLUCOSE 103 (H) 11/06/2020   CHOL 163 11/06/2020   TRIG 113.0 11/06/2020   HDL 49.70 11/06/2020   LDLCALC 91 11/06/2020   ALT 14 11/06/2020   AST 18 11/06/2020   NA 143 11/06/2020   K 3.3 (L) 11/06/2020   CL 107 11/06/2020   CREATININE 1.01 11/06/2020   BUN 20 11/06/2020   CO2 26 11/06/2020   TSH 1.50 07/23/2018   HGBA1C 5.6 11/06/2020   MICROALBUR 8.3 (H) 02/19/2020    MM 3D SCREEN BREAST BILATERAL  Result Date: 12/20/2019 CLINICAL DATA:  Screening. EXAM: DIGITAL SCREENING BILATERAL MAMMOGRAM WITH TOMO AND CAD COMPARISON:  Previous exam(s). ACR Breast Density Category b: There are scattered areas of fibroglandular density. FINDINGS: There are no findings suspicious for malignancy. Images were processed with CAD. IMPRESSION: No mammographic evidence of malignancy. A result letter of this screening mammogram will be mailed directly to the patient. RECOMMENDATION: Screening mammogram in one year. (Code:[id number]-B-01Y) BI-RADS CATEGORY  1: Negative. Electronically Signed   By: Curlene Dolphin M.D.   On: 12/20/2019 12:53    Assessment & Plan:   There are no diagnoses linked to this encounter.   No orders of the defined types were placed in this encounter.    Follow-up: No follow-ups on file.  Walker Kehr, MD

## 2020-11-12 NOTE — Assessment & Plan Note (Signed)
On KCl 

## 2020-11-12 NOTE — Assessment & Plan Note (Signed)
Colon polyps Dr Henrene Pastor Colonoscopy in 3 years

## 2020-11-12 NOTE — Assessment & Plan Note (Signed)
No relapse Hydrate well

## 2020-11-12 NOTE — Assessment & Plan Note (Signed)
Discussed Coronary calcium CT score is 0 10/20

## 2020-11-12 NOTE — Assessment & Plan Note (Signed)
Dr Dwyane Dee On [name]

## 2020-11-24 ENCOUNTER — Other Ambulatory Visit: Payer: Self-pay | Admitting: Internal Medicine

## 2020-12-02 ENCOUNTER — Encounter: Payer: Self-pay | Admitting: Internal Medicine

## 2020-12-02 DIAGNOSIS — H2513 Age-related nuclear cataract, bilateral: Secondary | ICD-10-CM | POA: Diagnosis not present

## 2020-12-02 DIAGNOSIS — E119 Type 2 diabetes mellitus without complications: Secondary | ICD-10-CM | POA: Diagnosis not present

## 2020-12-02 DIAGNOSIS — H40013 Open angle with borderline findings, low risk, bilateral: Secondary | ICD-10-CM | POA: Diagnosis not present

## 2020-12-02 DIAGNOSIS — H524 Presbyopia: Secondary | ICD-10-CM | POA: Diagnosis not present

## 2020-12-02 LAB — HM DIABETES EYE EXAM

## 2020-12-23 ENCOUNTER — Ambulatory Visit (INDEPENDENT_AMBULATORY_CARE_PROVIDER_SITE_OTHER): Payer: Medicare Other

## 2020-12-23 ENCOUNTER — Other Ambulatory Visit: Payer: Self-pay

## 2020-12-23 VITALS — BP 122/64 | HR 64 | Temp 97.9°F | Resp 16 | Ht 64.0 in | Wt 163.4 lb

## 2020-12-23 DIAGNOSIS — Z0000 Encounter for general adult medical examination without abnormal findings: Secondary | ICD-10-CM | POA: Diagnosis not present

## 2020-12-23 NOTE — Patient Instructions (Signed)
[name] , Thank you for taking time to come for your Medicare Wellness Visit. I appreciate your ongoing commitment to your health goals. Please review the following plan we discussed and let me know if I can assist you in the future.   Screening recommendations/referrals: Colonoscopy: 09/08/2020; due every 3 years Mammogram: 12/20/2019 Bone Density: 06/03/2014; currently on Prolia Recommended yearly ophthalmology/optometry visit for glaucoma screening and checkup Recommended yearly dental visit for hygiene and checkup  Vaccinations: Influenza vaccine: 07/01/2020 Pneumococcal vaccine: 11/16/2015, 01/17/2018 Tdap vaccine: 02/05/2013; due every 10 years  Shingles vaccine: Please call your insurance company to determine your out of pocket expense for the Shingrix vaccine. You may receive this vaccine at your local pharmacy. Covid-19: 10/11/2019, 11/05/2019, 06/02/2020  Advanced directives: Please bring a copy of your health care power of attorney and living will to the office at your convenience.  Conditions/risks identified: Yes; Reviewed health maintenance screenings with patient today and relevant education, vaccines, and/or referrals were provided. Please continue to do your personal lifestyle choices by: daily care of teeth and gums, regular physical activity (goal should be 5 days a week for 30 minutes), eat a healthy diet, avoid tobacco and drug use, limiting any alcohol intake, taking a low-dose aspirin (if not allergic or have been advised by your provider otherwise) and taking vitamins and minerals as recommended by your provider. Continue doing brain stimulating activities (puzzles, reading, adult coloring books, staying active) to keep memory sharp. Continue to eat heart healthy diet (full of fruits, vegetables, whole grains, lean protein, water--limit salt, fat, and sugar intake) and increase physical activity as tolerated.  Next appointment: Please schedule your next Medicare Wellness Visit  with your Nurse Health Advisor in 1 year by calling [phone].  Preventive Care 46 Years and Older, Female Preventive care refers to lifestyle choices and visits with your health care provider that can promote health and wellness. What does preventive care include?  A yearly physical exam. This is also called an annual well check.  Dental exams once or twice a year.  Routine eye exams. Ask your health care provider how often you should have your eyes checked.  Personal lifestyle choices, including:  Daily care of your teeth and gums.  Regular physical activity.  Eating a healthy diet.  Avoiding tobacco and drug use.  Limiting alcohol use.  Practicing safe sex.  Taking low-dose aspirin every day.  Taking vitamin and mineral supplements as recommended by your health care provider. What happens during an annual well check? The services and screenings done by your health care provider during your annual well check will depend on your age, overall health, lifestyle risk factors, and family history of disease. Counseling  Your health care provider may ask you questions about your:  Alcohol use.  Tobacco use.  Drug use.  Emotional well-being.  Home and relationship well-being.  Sexual activity.  Eating habits.  History of falls.  Memory and ability to understand (cognition).  Work and work Statistician.  Reproductive health. Screening  You may have the following tests or measurements:  Height, weight, and BMI.  Blood pressure.  Lipid and cholesterol levels. These may be checked every 5 years, or more frequently if you are over 59 years old.  Skin check.  Lung cancer screening. You may have this screening every year starting at age 11 if you have a 30-pack-year history of smoking and currently smoke or have quit within the past 15 years.  Fecal occult blood test (FOBT) of the stool.  You may have this test every year starting at age 28.  Flexible  sigmoidoscopy or colonoscopy. You may have a sigmoidoscopy every 5 years or a colonoscopy every 10 years starting at age 31.  Hepatitis C blood test.  Hepatitis B blood test.  Sexually transmitted disease (STD) testing.  Diabetes screening. This is done by checking your blood sugar (glucose) after you have not eaten for a while (fasting). You may have this done every 1-3 years.  Bone density scan. This is done to screen for osteoporosis. You may have this done starting at age 56.  Mammogram. This may be done every 1-2 years. Talk to your health care provider about how often you should have regular mammograms. Talk with your health care provider about your test results, treatment options, and if necessary, the need for more tests. Vaccines  Your health care provider may recommend certain vaccines, such as:  Influenza vaccine. This is recommended every year.  Tetanus, diphtheria, and acellular pertussis (Tdap, Td) vaccine. You may need a Td booster every 10 years.  Zoster vaccine. You may need this after age 48.  Pneumococcal 13-valent conjugate (PCV13) vaccine. One dose is recommended after age 55.  Pneumococcal polysaccharide (PPSV23) vaccine. One dose is recommended after age 77. Talk to your health care provider about which screenings and vaccines you need and how often you need them. This information is not intended to replace advice given to you by your health care provider. Make sure you discuss any questions you have with your health care provider. Document Released: 09/18/2015 Document Revised: 05/11/2016 Document Reviewed: 06/23/2015 Elsevier Interactive Patient Education  2017 [employer] Prevention in the Home Falls can cause injuries. They can happen to people of all ages. There are many things you can do to make your home safe and to help prevent falls. What can I do on the outside of my home?  Regularly fix the edges of walkways and driveways and fix any  cracks.  Remove anything that might make you trip as you walk through a door, such as a raised step or threshold.  Trim any bushes or trees on the path to your home.  Use bright outdoor lighting.  Clear any walking paths of anything that might make someone trip, such as rocks or tools.  Regularly check to see if handrails are loose or broken. Make sure that both sides of any steps have handrails.  Any raised decks and porches should have guardrails on the edges.  Have any leaves, snow, or ice cleared regularly.  Use sand or salt on walking paths during winter.  Clean up any spills in your garage right away. This includes oil or grease spills. What can I do in the bathroom?  Use night lights.  Install grab bars by the toilet and in the tub and shower. Do not use towel bars as grab bars.  Use non-skid mats or decals in the tub or shower.  If you need to sit down in the shower, use a plastic, non-slip stool.  Keep the floor dry. Clean up any water that spills on the floor as soon as it happens.  Remove soap buildup in the tub or shower regularly.  Attach bath mats securely with double-sided non-slip rug tape.  Do not have throw rugs and other things on the floor that can make you trip. What can I do in the bedroom?  Use night lights.  Make sure that you have a light by your bed that  is easy to reach.  Do not use any sheets or blankets that are too big for your bed. They should not hang down onto the floor.  Have a firm chair that has side arms. You can use this for support while you get dressed.  Do not have throw rugs and other things on the floor that can make you trip. What can I do in the kitchen?  Clean up any spills right away.  Avoid walking on wet floors.  Keep items that you use a lot in easy-to-reach places.  If you need to reach something above you, use a strong step stool that has a grab bar.  Keep electrical cords out of the way.  Do not use floor  polish or wax that makes floors slippery. If you must use wax, use non-skid floor wax.  Do not have throw rugs and other things on the floor that can make you trip. What can I do with my stairs?  Do not leave any items on the stairs.  Make sure that there are handrails on both sides of the stairs and use them. Fix handrails that are broken or loose. Make sure that handrails are as long as the stairways.  Check any carpeting to make sure that it is firmly attached to the stairs. Fix any carpet that is loose or worn.  Avoid having throw rugs at the top or bottom of the stairs. If you do have throw rugs, attach them to the floor with carpet tape.  Make sure that you have a light switch at the top of the stairs and the bottom of the stairs. If you do not have them, ask someone to add them for you. What else can I do to help prevent falls?  Wear shoes that:  Do not have high heels.  Have rubber bottoms.  Are comfortable and fit you well.  Are closed at the toe. Do not wear sandals.  If you use a stepladder:  Make sure that it is fully opened. Do not climb a closed stepladder.  Make sure that both sides of the stepladder are locked into place.  Ask someone to hold it for you, if possible.  Clearly mark and make sure that you can see:  Any grab bars or handrails.  First and last steps.  Where the edge of each step is.  Use tools that help you move around (mobility aids) if they are needed. These include:  Canes.  Walkers.  Scooters.  Crutches.  Turn on the lights when you go into a dark area. Replace any light bulbs as soon as they burn out.  Set up your furniture so you have a clear path. Avoid moving your furniture around.  If any of your floors are uneven, fix them.  If there are any pets around you, be aware of where they are.  Review your medicines with your doctor. Some medicines can make you feel dizzy. This can increase your chance of falling. Ask your  doctor what other things that you can do to help prevent falls. This information is not intended to replace advice given to you by your health care provider. Make sure you discuss any questions you have with your health care provider. Document Released: 06/18/2009 Document Revised: 01/28/2016 Document Reviewed: 09/26/2014 Elsevier Interactive Patient Education  2017 [employer].

## 2020-12-23 NOTE — Progress Notes (Addendum)
Subjective:   [name] is a 75 y.o. female who presents for Medicare Annual (Subsequent) preventive examination.  Review of Systems    No ROS. Medicare Wellness Visit. Additional risk factors are reflected in social history. Cardiac Risk Factors include: advanced age (>10mn, >>53women);diabetes mellitus;dyslipidemia;hypertension;family history of premature cardiovascular disease Sleep Patterns: No sleep issues, feels rested on waking and sleeps 6-8 hours nightly. Home Safety/Smoke Alarms: Feels safe in home; uses home alarm. Smoke alarms in place. Living environment: 2-story home; Lives with spouse; no needs for DME; good support system. Seat Belt Safety/Bike Helmet: Wears seat belt.    Objective:    Today's Vitals   12/23/20 1530  BP: 122/64  Pulse: 64  Resp: 16  Temp: 97.9 F (36.6 C)  SpO2: 96%  Weight: 163 lb 6.4 oz (74.1 kg)  Height: '5\' 4"'  (1.626 m)  PainSc: 0-No pain   Body mass index is 28.05 kg/m.  Advanced Directives 12/23/2020 10/16/2018 04/27/2018 04/02/2018 04/01/2018 10/17/2017 05/16/2017  Does Patient Have a Medical Advance Directive? Yes Yes Yes Yes No Yes Yes  Type of Advance Directive Living will;Healthcare Power of [place] will - [place] will  Does patient want to make changes to medical advance directive? No - Patient declined No - Patient declined - - - - No - Patient declined  Copy of [place] Chart? No - copy requested No - copy requested - No - copy requested - No - copy requested No - copy requested  Would patient like information on creating a medical advance directive? - - - - No - Patient declined - -    Current Medications (verified) Outpatient Encounter Medications as of 12/23/2020  Medication Sig   amLODipine (NORVASC) 5 MG tablet Take 1 tablet (5 mg total) by mouth daily.    aspirin 81 MG EC tablet Take 81 mg by mouth daily.   bimatoprost (LUMIGAN) 0.01 % SOLN Place 1 drop into both eyes at bedtime.   Blood Glucose Monitoring Suppl (ONETOUCH VERIO FLEX SYSTEM) w/Device KIT 1 each by Does not apply route in the morning, at noon, and at bedtime.    Cholecalciferol (VITAMIN D3) 1.25 MG (50000 UT) CAPS TAKE ONE CAPSULE BY MOUTH EVERY 30 DAYS   Cyanocobalamin (NASCOBAL) 500 MCG/0.1ML SOLN USE 1 SPRAY IN 1 NOSTRIL  ONCE A WEEK   denosumab (PROLIA) 60 MG/ML SOLN injection Inject 60 mg into the skin every 6 (six) months. Administer in upper arm, thigh, or abdomen   diphenoxylate-atropine (LOMOTIL) 2.5-0.025 MG tablet TAKE 1 TABLET BY MOUTH FOUR TIMES A DAY AS NEEDED FOR DIARRHEA OR LOOSE STOOLS   furosemide (LASIX) 20 MG tablet TAKE 0.5-1 TABLETS (10-20 MG TOTAL) BY MOUTH DAILY AS NEEDED FOR EDEMA (FOR LEG SWELLING).   ipratropium (ATROVENT) 0.06 % nasal spray Place 2 sprays into the nose 3 (three) times daily. (Patient not taking: Reported on 09/08/2020)   JARDIANCE 10 MG TABS tablet TAKE 1 TABLET (10 MG TOTAL) BY MOUTH DAILY WITH BREAKFAST.   Methylcellulose, Laxative, (CITRUCEL PO) Take 17 g by mouth daily. Mix 17grams (1 tbsp) with 3-6 tbsp of water and drink daily.   OneTouch Delica Lancets 381EMISC USE AS DIRECTED TO CHECK BLOOD SUGAR IN THE MORNING, AT NOON, AND AT BEDTIME   ONETOUCH VERIO test strip USE ONETOUCH VERIO TEST STRIPS AS INSTRUCTED TO CHECK BLOOD SUGAR THREE TIMES DAILY.  potassium chloride SA (KLOR-CON) 20 MEQ tablet Take 1 tablet (20 mEq total) by mouth daily.   propranolol ER (INDERAL LA) 60 MG 24 hr capsule Take 1 capsule (60 mg total) by mouth 2 (two) times daily.   RABEprazole (ACIPHEX) 20 MG tablet TAKE 1 TABLET BY MOUTH EVERY DAY   Timolol Maleate 0.5 % (DAILY) SOLN    triamcinolone cream (KENALOG) 0.1 % Apply 1 application topically 2 (two) times daily as needed (ear bacteria).   No facility-administered encounter medications on file as of  12/23/2020.    Allergies (verified) Actonel [risedronate sodium], Boniva [ibandronate sodium], Calcium channel blockers, Chlorthalidone, Compazine, Lyrica [pregabalin], and Tape   History: Past Medical History:  Diagnosis Date   Adenomatous colon polyp 04/08/2011   Anemia    Anxiety    Cataract    Diabetes mellitus without complication ([place])    "pre-diabetic" per pt   Essential hypertension 08/10/2007   Chronic  On Catapress (per Dr Ubaldo Glassing) - increase to bid - d/c Hydralazine prn per Dr Caryl Comes d/c 3/19: Increase Inderal LA to bid   GERD (gastroesophageal reflux disease)    Glaucoma (increased eye pressure)    Heart murmur    HTN (hypertension)    IBS (irritable bowel syndrome)    LBP (low back pain)    Menopause    Osteoporosis    Ovarian cancer ([place]) 09/2008   Dr Gwyneth Revels   Pancreatic cyst    Vitamin B12 deficiency    Vitamin D deficiency    Past Surgical History:  Procedure Laterality Date   ABDOMINAL HYSTERECTOMY     APPENDECTOMY     BLADDER SURGERY  04/09/2020   tack and sling   CHOLECYSTECTOMY N/A 05/15/2017   Procedure: LAPAROSCOPIC CHOLECYSTECTOMY WITH INTRAOPERATIVE CHOLANGIOGRAM AND LYSIS OF ADHESIONS;  Surgeon: Fanny Skates, MD;  Location: [facility];  Service: General;  Laterality: N/A;   COLONOSCOPY  2018   Dr.Perry   ELBOW FRACTURE SURGERY     age 27- left elbow   HEMORRHOID SURGERY  2001   Ovarian Cancer Debulking  09/2008   POLYPECTOMY     Family History  Problem Relation Age of Onset   Alzheimer's disease Mother 22   Other Mother 8       TAH for excessive bleeding   Lung cancer Father        smoker; metastasis to stomach and other areas   Heart attack Maternal Uncle    Heart disease Maternal Uncle    Other Paternal Aunt        stomach issues   Heart Problems Paternal Uncle    Other Maternal Grandmother        stomach issues; +hysterectomy   Heart attack Maternal Grandfather    Heart disease Maternal Grandfather    Infertility Daughter    Stomach  cancer Cousin        dx. mid-60s   Other Cousin        stomach issues   Leukemia Cousin 18   Stomach cancer Other    Cancer Cousin        unknown type   Other Cousin        stomach issues   Heart Problems Maternal Uncle    Diabetes Paternal Aunt    Heart Problems Paternal Uncle    Emphysema Paternal Uncle        work exposure   Breast cancer Cousin        dx. 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  Tobacco Use   Smoking status: Never Smoker   Smokeless tobacco: Never Used  Scientific laboratory technician Use: Never used  Substance and Sexual Activity   Alcohol use: No    Alcohol/week: 0.0 standard drinks   Drug use: No   Sexual activity: Not on file  Other Topics Concern   Not on file  Social History Narrative   Lives with spouse   Caffeine use: cokes      Right handed    Social Determinants of Health   Financial Resource Strain: Low Risk    Difficulty of Paying Living Expenses: Not hard at all  Food Insecurity: No Food Insecurity   Worried About Charity fundraiser in the Last Year: Never true   Ran Out of Food in the Last Year: Never true  Transportation Needs: No Transportation Needs   Lack of Transportation (Medical): No   Lack of Transportation (Non-Medical): No  Physical Activity: Inactive   Days of Exercise per Week: 0 days   Minutes of Exercise per Session: 0 min  Stress: No Stress Concern Present   Feeling of Stress : Not at all  Social Connections: Socially Integrated   Frequency of Communication with Friends and Family: More than three times a week   Frequency of Social Gatherings with Friends and Family: More than three times a week   Attends Religious  Services: More than 4 times per year   Active Member of [employer] or Organizations: No   Attends Music therapist: More than 4 times per year   Marital Status: Married    Tobacco Counseling Counseling given: Not Answered   Clinical Intake:  Pre-visit preparation completed: Yes  Pain : No/denies pain Pain Score: 0-No pain     BMI - recorded: 28.05 Nutritional Status: BMI 25 -29 Overweight Nutritional Risks: None Diabetes: Yes CBG done?: No Did pt. bring in CBG monitor from home?: No  How often do you need to have someone help you when you read instructions, pamphlets, or other written materials from your doctor or pharmacy?: 1 - Never What is the last grade level you completed in school?: Bachelor's Degree  Diabetic? yes  Interpreter Needed?: No  Information entered by :: Lisette Abu, LPN   Activities of Daily Living In your present state of health, do you have any difficulty performing the following activities: 12/23/2020  Hearing? N  Vision? N  Difficulty concentrating or making decisions? N  Walking or climbing stairs? N  Dressing or bathing? N  Doing errands, shopping? N  Preparing Food and eating ? N  Using the Toilet? N  In the past six months, have you accidently leaked urine? Y  Do you have problems with loss of bowel control? Y  Managing your Medications? N  Managing your Finances? N  Housekeeping or managing your Housekeeping? N  Some recent data might be hidden    Patient Care Team: [name], Evie Lacks, MD as PCP - General Marygrace Drought, MD (Ophthalmology) Rexene Agent, MD as Attending Physician (Nephrology) Irene Shipper, MD as Consulting Physician (Gastroenterology) Elayne Snare, MD as Consulting Physician (Endocrinology)  Marti Sleigh, MD as Attending Physician (Gynecology) Kathrynn Ducking, MD as Consulting Physician (Neurology)  Indicate any recent Medical Services you may have received from other than [facility]  providers in the past year (date may be approximate).     Assessment:   This is a routine wellness examination for Brie.  Hearing/Vision screen No exam data present  Dietary issues and exercise activities discussed: Current Exercise Habits: The patient does not participate in regular exercise at present, Exercise limited by: None identified  Goals      Client will verbalize knowledge of diabetes self-management as evidenced by Hgb A1C <7 or as defined by provider.         Patient Stated       Depression Screen PHQ 2/9 Scores 12/23/2020 11/07/2019 04/27/2018 01/17/2018 12/13/2016 11/26/2015 11/16/2015  PHQ - 2 Score 0 0 0 0 0 0 0    Fall Risk Fall Risk  12/23/2020 07/29/2019 04/27/2018 01/17/2018 11/16/2016  Falls in the past year? 1 0 No No No  Comment - Emmi Telephone Survey: data to providers prior to load - - -  Number falls in past yr: 0 - - - -  Injury with Fall? 0 - - - -    FALL RISK PREVENTION PERTAINING TO THE HOME:  Any stairs in or around the home? Yes  If so, are there any without handrails? No  Home free of loose throw rugs in walkways, pet beds, electrical cords, etc? Yes  Adequate lighting in your home to reduce risk of falls? Yes   ASSISTIVE DEVICES UTILIZED TO PREVENT FALLS:  Life alert? No  Use of a cane, walker or w/c? No  Grab bars in the bathroom? No  Shower chair or bench in shower? No  Elevated toilet seat or a handicapped toilet? No   TIMED UP AND GO:  Was the test performed? No .  Length of time to ambulate 10 feet: 0 sec.   Gait steady and fast without use of assistive device  Cognitive Function: Normal cognitive status assessed by direct observation by this Nurse Health Advisor. No abnormalities found.         Immunizations Immunization History  Administered Date(s) Administered   Influenza Whole 06/16/2009   Influenza, High Dose Seasonal PF 08/07/2013, 05/18/2016, 07/01/2020   Influenza, Seasonal, Injecte, Preservative Fre 08/07/2012    Influenza,inj,Quad PF,6+ Mos 05/27/2014, 08/02/2017, 07/16/2018, 07/24/2019   PFIZER(Purple Top)SARS-COV-2 Vaccination 10/11/2019, 11/05/2019, 06/02/2020   Pneumococcal Conjugate-13 01/17/2018   Pneumococcal Polysaccharide-23 11/16/2015   Td 02/05/2013    TDAP status: Up to date  Flu Vaccine status: Up to date  Pneumococcal vaccine status: Up to date  Covid-19 vaccine status: Completed vaccines  Qualifies for Shingles Vaccine? Yes   Zostavax completed No   Shingrix Completed?: No.    Education has been provided regarding the importance of this vaccine. Patient has been advised to call insurance company to determine out of pocket expense if they have not yet received this vaccine. Advised may also receive vaccine at local pharmacy or Health Dept. Verbalized acceptance and understanding.  Screening Tests Health Maintenance  Topic Date Due   Hepatitis C Screening  Never done   FOOT EXAM  Never done   COVID-19 Vaccine (4 - Booster for Pfizer series) 11/30/2020   URINE MICROALBUMIN  02/18/2021   INFLUENZA VACCINE  04/05/2021   HEMOGLOBIN A1C  05/09/2021   OPHTHALMOLOGY EXAM  12/02/2021   TETANUS/TDAP  02/06/2023   COLONOSCOPY (Pts 45-49yr Insurance coverage will need to  be confirmed)  09/09/2023   DEXA SCAN  Completed   PNA vac Low Risk Adult  Completed   HPV VACCINES  Aged Out    Health Maintenance  Health Maintenance Due  Topic Date Due   Hepatitis C Screening  Never done   FOOT EXAM  Never done   COVID-19 Vaccine (4 - Booster for Pfizer series) 11/30/2020   URINE MICROALBUMIN  02/18/2021    Colorectal cancer screening: Type of screening: Colonoscopy. Completed 09/08/2020. Repeat every 3 years  Mammogram status: Completed 12/20/2019. Repeat every year  Bone Density status: Completed 06/03/2014. Results reflect: Bone density results: OSTEOPENIA. Repeat every 0 years. (Patient now on Prolia injections every 6 months)  Lung Cancer Screening: (Low Dose CT Chest  recommended if Age 10-80 years, 30 pack-year currently smoking OR have quit w/in 15years.) does not qualify.   Lung Cancer Screening Referral: no  Additional Screening:  Hepatitis C Screening: does qualify; Completed no  Vision Screening: Recommended annual ophthalmology exams for early detection of glaucoma and other disorders of the eye. Is the patient up to date with their annual eye exam?  Yes  Who is the provider or what is the name of the office in which the patient attends annual eye exams? Marygrace Drought, MD. If pt is not established with a provider, would they like to be referred to a provider to establish care? No .   Dental Screening: Recommended annual dental exams for proper oral hygiene  Community Resource Referral / Chronic Care Management: CRR required this visit?  No   CCM required this visit?  No      Plan:     I have personally reviewed and noted the following in the patient's chart:   Medical and social history Use of alcohol, tobacco or illicit drugs  Current medications and supplements Functional ability and status Nutritional status Physical activity Advanced directives List of other physicians Hospitalizations, surgeries, and ER visits in previous 12 months Vitals Screenings to include cognitive, depression, and falls Referrals and appointments  In addition, I have reviewed and discussed with patient certain preventive protocols, quality metrics, and best practice recommendations. A written personalized care plan for preventive services as well as general preventive health recommendations were provided to patient.     Sheral Flow, LPN   [id number]   Nurse Notes:  Medications reviewed with patient; no opioid use noted.  Medical screening examination/treatment/procedure(s) were performed by non-physician practitioner and as supervising physician I was immediately available for consultation/collaboration.  I agree with above. Lew Dawes, MD

## 2020-12-30 ENCOUNTER — Other Ambulatory Visit: Payer: Self-pay | Admitting: Internal Medicine

## 2020-12-30 ENCOUNTER — Telehealth: Payer: Self-pay | Admitting: *Deleted

## 2020-12-30 DIAGNOSIS — Z1231 Encounter for screening mammogram for malignant neoplasm of breast: Secondary | ICD-10-CM

## 2020-12-30 NOTE — Telephone Encounter (Signed)
Patient called and scheduled a follow up appt for next week with Dr Delsa Sale

## 2020-12-31 DIAGNOSIS — N3281 Overactive bladder: Secondary | ICD-10-CM | POA: Diagnosis not present

## 2020-12-31 DIAGNOSIS — N3941 Urge incontinence: Secondary | ICD-10-CM | POA: Diagnosis not present

## 2021-01-04 ENCOUNTER — Encounter: Payer: Self-pay | Admitting: Endocrinology

## 2021-01-05 ENCOUNTER — Encounter: Payer: Self-pay | Admitting: Obstetrics & Gynecology

## 2021-01-05 NOTE — Progress Notes (Signed)
Follow Up Note: Gyn-Onc  [name] 75 y.o. female  CC: She presents for a follow-up visit  HPI: She has a remote h/o a stage IIIC Fallopian tube cancer Oncology History  Fallopian tube cancer, carcinoma ([place])  10/07/2008 Cancer Staging   Cancer Staging Fallopian tube cancer, carcinoma ([place]) Staging form: Fallopian Tube, AJCC 7th Edition - Clinical: IIIC - Unsigned - Pathologic: IIIC - Unsigned She was optimally debulked with minimal disease on peritoneal surfaces   04/14/2009 PET scan   NED   02/03/2014 Initial Diagnosis   Fallopian tube cancer, carcinoma ([place])    - 02/03/2009 Chemotherapy   . She received 6 cycles of carboplatin and Taxol chemotherapy completed in June 2010     Tumor Marker   Patient's tumor was tested for the following markers: CA-125 Results of the tumor marker test revealed : 7 mo ago  (04/22/19) 2 yr ago  (11/20/17) 3 yr ago  (11/16/16) 3 yr ago  (03/21/16)  CA 125 <35 U/mL 10  9 CM       Interval History:  History remarkable for IBS, OAB.  She is s/p A+P, MUS, cystoscopyfor StageIIIAVVP and mixed UI. She is nonccompliant w/Myrbetriq.  She denies increasing abdominal girth pain or unexplained weight loss.  She has occasional diffuse abdominal/back pain..      Review of Systems  Review of Systems  Constitutional: Negative for malaise/fatigue.  Gastrointestinal: Positive for diarrhea. Negative for abdominal pain.  Genitourinary: Positive for frequency and urgency.    Current Meds:  Outpatient Encounter Medications as of 01/06/2021  Medication Sig  . amLODipine (NORVASC) 5 MG tablet Take 1 tablet (5 mg total) by mouth daily.  [name] aspirin 81 MG EC tablet Take 81 mg by mouth daily.  . bimatoprost (LUMIGAN) 0.01 % SOLN Place 1 drop into both eyes at bedtime.  . Blood Glucose Monitoring Suppl (ONETOUCH VERIO FLEX SYSTEM) w/Device KIT 1 each by Does not apply route in the  morning, at noon, and at bedtime.   . Cholecalciferol (VITAMIN D3) 1.25 MG (50000 UT) CAPS TAKE ONE CAPSULE BY MOUTH EVERY 30 DAYS  . Cyanocobalamin (NASCOBAL) 500 MCG/0.1ML SOLN USE 1 SPRAY IN 1 NOSTRIL  ONCE A WEEK  . denosumab (PROLIA) 60 MG/ML SOLN injection Inject 60 mg into the skin every 6 (six) months. Administer in upper arm, thigh, or abdomen  . diphenoxylate-atropine (LOMOTIL) 2.5-0.025 MG tablet TAKE 1 TABLET BY MOUTH FOUR TIMES A DAY AS NEEDED FOR DIARRHEA OR LOOSE STOOLS  . furosemide (LASIX) 20 MG tablet TAKE 0.5-1 TABLETS (10-20 MG TOTAL) BY MOUTH DAILY AS NEEDED FOR EDEMA (FOR LEG SWELLING).  [name] JARDIANCE 10 MG TABS tablet TAKE 1 TABLET (10 MG TOTAL) BY MOUTH DAILY WITH BREAKFAST.  Glory Rosebush Delica Lancets 59F MISC USE AS DIRECTED TO CHECK BLOOD SUGAR IN THE MORNING, AT NOON, AND AT BEDTIME  . ONETOUCH VERIO test strip USE ONETOUCH VERIO TEST STRIPS AS INSTRUCTED TO CHECK BLOOD SUGAR THREE TIMES DAILY.  [name] potassium chloride SA (KLOR-CON) 20 MEQ tablet Take 1 tablet (20 mEq total) by mouth daily.  . propranolol ER (INDERAL LA) 60 MG 24 hr capsule Take 1 capsule (60 mg total) by mouth 2 (two) times daily. (Patient taking differently: Take 60 mg by mouth daily.)  . RABEprazole (ACIPHEX) 20 MG tablet TAKE 1 TABLET BY MOUTH EVERY DAY  . timolol (TIMOPTIC) 0.5 % ophthalmic solution Place 1 drop into both eyes 2 (two) times daily.  [name] triamcinolone cream (KENALOG) 0.1 % Apply  1 application topically 2 (two) times daily as needed (ear bacteria).  . [DISCONTINUED] Timolol Maleate 0.5 % (DAILY) SOLN   . ipratropium (ATROVENT) 0.06 % nasal spray Place 2 sprays into the nose 3 (three) times daily. (Patient not taking: Reported on 09/08/2020)  . Methylcellulose, Laxative, (CITRUCEL PO) Take 17 g by mouth daily. Mix 17grams (1 tbsp) with 3-6 tbsp of water and drink daily. (Patient not taking: Reported on 01/05/2021)   No facility-administered encounter medications on file as of 01/06/2021.     Allergy:  Allergies  Allergen Reactions  . Actonel [Risedronate Sodium]     Upset stomach  . Boniva [Ibandronate Sodium]     cramp  . Calcium Channel Blockers     Upset stomach  . Chlorthalidone     Diarrhea per pt  . Compazine     Daughter reacts to compazine/pt does not want to take  . Lyrica [Pregabalin]     Dizzy   . Tape     redness    Social Hx:   Social History   Socioeconomic History  . Marital status: Married    Spouse name: Dominica Severin  . Number of children: 2  . Years of education: BS  . Highest education level: Not on file  Occupational History  . 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 Smoking status: Never Smoker  . Smokeless tobacco: Never Used  Vaping Use  . Vaping Use: Never used  Substance and Sexual Activity  . Alcohol use: No    Alcohol/week: 0.0 standard drinks  . Drug use: No  . Sexual activity: Not on file  Other Topics Concern  . Not on file  Social History Narrative   Lives with spouse   Caffeine use: cokes      Right handed    Social Determinants of Health   Financial Resource Strain: Low Risk   . Difficulty of Paying Living Expenses: Not hard at all  Food Insecurity: No Food Insecurity  . Worried About Charity fundraiser in the Last Year: Never true  . Ran Out of Food in the Last Year: Never true  Transportation Needs: No Transportation Needs  . Lack of Transportation (Medical): No  . Lack of Transportation (Non-Medical): No  Physical Activity: Inactive  . Days of Exercise per Week: 0 days  . Minutes of Exercise per Session: 0 min  Stress: No Stress Concern Present  . Feeling of Stress : Not at all  Social Connections: Socially Integrated  . Frequency of Communication with Friends and Family: More than three times a week  . Frequency of Social Gatherings with Friends and Family: More than three times a week  . Attends Religious Services: More than 4 times per year  . Active Member of Clubs or  Organizations: No  . Attends Archivist Meetings: More than 4 times per year  . Marital Status: Married  Human resources officer Violence: Not on file    Past Surgical Hx:  Past Surgical History:  Procedure Laterality Date  . ABDOMINAL HYSTERECTOMY    . APPENDECTOMY    . BLADDER SURGERY  04/09/2020   tack and sling  . CHOLECYSTECTOMY N/A 05/15/2017   Procedure: LAPAROSCOPIC CHOLECYSTECTOMY WITH INTRAOPERATIVE CHOLANGIOGRAM AND LYSIS OF ADHESIONS;  Surgeon: Fanny Skates, MD;  Location: [facility];  Service: General;  Laterality: N/A;  . COLONOSCOPY  2018   Dr.Perry  . ELBOW FRACTURE SURGERY     age 57- left elbow  . HEMORRHOID  SURGERY  2001  . Ovarian Cancer Debulking  09/2008  . POLYPECTOMY      Past Medical Hx:  Past Medical History:  Diagnosis Date  . Adenomatous colon polyp 04/08/2011  . Anemia   . Anxiety   . Cataract   . Diabetes mellitus without complication ([place])    "pre-diabetic" per pt  . Essential hypertension 08/10/2007   Chronic  On Catapress (per Dr Ubaldo Glassing) - increase to bid - d/c Hydralazine prn per Dr Caryl Comes d/c 3/19: Increase Inderal LA to bid  . GERD (gastroesophageal reflux disease)   . Glaucoma (increased eye pressure)   . Heart murmur   . HTN (hypertension)   . IBS (irritable bowel syndrome)   . LBP (low back pain)   . Menopause   . Osteoporosis   . Ovarian cancer ([place]) 09/2008   Dr Gwyneth Revels  . Pancreatic cyst   . Vitamin B12 deficiency   . Vitamin D deficiency     Family Hx:  Family History  Problem Relation Age of Onset  . Alzheimer's disease Mother 11  . Other Mother 63       TAH for excessive bleeding  . Lung cancer Father        smoker; metastasis to stomach and other areas  . Heart attack Maternal Uncle   . Heart disease Maternal Uncle   . Other Paternal Aunt        stomach issues  . Heart Problems Paternal Uncle   . Other Maternal Grandmother        stomach issues; +hysterectomy  . Heart attack Maternal Grandfather   . Heart  disease Maternal Grandfather   . Infertility Daughter   . Stomach cancer Cousin        dx. mid-60s  . Other Cousin        stomach issues  . Leukemia Cousin 18  . Stomach cancer Other   . Cancer Cousin        unknown type  . Other Cousin        stomach issues  . Heart Problems Maternal Uncle   . Diabetes Paternal Aunt   . Heart Problems Paternal Uncle   . Emphysema Paternal Uncle        work exposure  . Breast cancer Cousin        dx. late 60s-early 70s  . Colon cancer Neg Hx   . Rectal cancer Neg Hx   . Esophageal cancer Neg Hx     Vitals:  BP (!) 148/68 (BP Location: Left Arm, Patient Position: Sitting)   Pulse (!) 58   Temp (!) 96.9 F (36.1 C) (Tympanic)   Resp 20   Wt 166 lb (75.3 kg)   BMI 28.49 kg/m     Physical Exam:  Physical Exam Constitutional:      General: She is not in acute distress. Abdominal:     General: There is no distension.     Palpations: Abdomen is soft. There is no mass.     Tenderness: There is no abdominal tenderness.  Genitourinary:    Vagina: Normal.     Comments: Angiokeratomas, b/l; short vagina, no palpable abnormality.  Bladder well supported.  Neurological:     Mental Status: She is alert and oriented to person, place, and time.     Assessment/Plan:  Problem List Items Addressed This Visit      Oncology   Fallopian tube cancer, carcinoma ([place]) - Primary     Remote h/o a stage IIIC Fallopian  tube cancer  Symptom review confounded by comorbid issues.  Exam findings not suggestive of recurrence  Keep a symptom diary Annual f/u w/a gynecologic generalist is appropriate             I personally spent 30 minutes face-to-face and non-face-to-face in the care of this patient, which includes all pre, intra, and post visit time on the date of service.   Lahoma Crocker, MD 01/06/2021, 4:12 PM

## 2021-01-05 NOTE — Assessment & Plan Note (Addendum)
  Remote h/o a stage IIIC Fallopian tube cancer  Symptom review confounded by comorbid issues.  Exam findings not suggestive of recurrence  Keep a symptom diary Annual f/u w/a gynecologic generalist is appropriate

## 2021-01-06 ENCOUNTER — Inpatient Hospital Stay: Payer: Medicare Other | Attending: Obstetrics & Gynecology | Admitting: Obstetrics & Gynecology

## 2021-01-06 ENCOUNTER — Encounter: Payer: Self-pay | Admitting: Obstetrics & Gynecology

## 2021-01-06 ENCOUNTER — Other Ambulatory Visit: Payer: Self-pay

## 2021-01-06 VITALS — BP 148/68 | HR 58 | Temp 96.9°F | Resp 20 | Wt 166.0 lb

## 2021-01-06 DIAGNOSIS — I10 Essential (primary) hypertension: Secondary | ICD-10-CM | POA: Diagnosis not present

## 2021-01-06 DIAGNOSIS — K219 Gastro-esophageal reflux disease without esophagitis: Secondary | ICD-10-CM | POA: Insufficient documentation

## 2021-01-06 DIAGNOSIS — E538 Deficiency of other specified B group vitamins: Secondary | ICD-10-CM | POA: Diagnosis not present

## 2021-01-06 DIAGNOSIS — Z79899 Other long term (current) drug therapy: Secondary | ICD-10-CM | POA: Diagnosis not present

## 2021-01-06 DIAGNOSIS — C5700 Malignant neoplasm of unspecified fallopian tube: Secondary | ICD-10-CM

## 2021-01-06 DIAGNOSIS — E119 Type 2 diabetes mellitus without complications: Secondary | ICD-10-CM | POA: Diagnosis not present

## 2021-01-06 DIAGNOSIS — M810 Age-related osteoporosis without current pathological fracture: Secondary | ICD-10-CM | POA: Insufficient documentation

## 2021-01-06 DIAGNOSIS — E559 Vitamin D deficiency, unspecified: Secondary | ICD-10-CM | POA: Diagnosis not present

## 2021-01-06 DIAGNOSIS — R011 Cardiac murmur, unspecified: Secondary | ICD-10-CM | POA: Insufficient documentation

## 2021-01-06 DIAGNOSIS — Z7982 Long term (current) use of aspirin: Secondary | ICD-10-CM | POA: Insufficient documentation

## 2021-01-06 NOTE — Patient Instructions (Signed)
Return in 1 yr 

## 2021-01-16 ENCOUNTER — Other Ambulatory Visit: Payer: Self-pay | Admitting: Endocrinology

## 2021-02-15 ENCOUNTER — Other Ambulatory Visit: Payer: Self-pay | Admitting: Internal Medicine

## 2021-02-18 ENCOUNTER — Other Ambulatory Visit: Payer: Self-pay

## 2021-02-18 ENCOUNTER — Ambulatory Visit
Admission: RE | Admit: 2021-02-18 | Discharge: 2021-02-18 | Disposition: A | Discharge status: Home / Self Care | Payer: Medicare Other | Source: Ambulatory Visit | Attending: Internal Medicine | Admitting: Internal Medicine

## 2021-02-18 DIAGNOSIS — Z1231 Encounter for screening mammogram for malignant neoplasm of breast: Secondary | ICD-10-CM | POA: Diagnosis not present

## 2021-03-17 ENCOUNTER — Other Ambulatory Visit: Payer: Self-pay

## 2021-03-17 ENCOUNTER — Encounter: Payer: Self-pay | Admitting: Internal Medicine

## 2021-03-17 ENCOUNTER — Ambulatory Visit (INDEPENDENT_AMBULATORY_CARE_PROVIDER_SITE_OTHER): Payer: Medicare Other | Admitting: Internal Medicine

## 2021-03-17 DIAGNOSIS — E669 Obesity, unspecified: Secondary | ICD-10-CM

## 2021-03-17 DIAGNOSIS — E1169 Type 2 diabetes mellitus with other specified complication: Secondary | ICD-10-CM

## 2021-03-17 DIAGNOSIS — R232 Flushing: Secondary | ICD-10-CM

## 2021-03-17 DIAGNOSIS — I10 Essential (primary) hypertension: Secondary | ICD-10-CM | POA: Diagnosis not present

## 2021-03-17 DIAGNOSIS — K219 Gastro-esophageal reflux disease without esophagitis: Secondary | ICD-10-CM

## 2021-03-17 DIAGNOSIS — K580 Irritable bowel syndrome with diarrhea: Secondary | ICD-10-CM | POA: Diagnosis not present

## 2021-03-17 DIAGNOSIS — E876 Hypokalemia: Secondary | ICD-10-CM

## 2021-03-17 DIAGNOSIS — E538 Deficiency of other specified B group vitamins: Secondary | ICD-10-CM | POA: Diagnosis not present

## 2021-03-17 NOTE — Assessment & Plan Note (Addendum)
Labile.  Continue on Amlodipine

## 2021-03-17 NOTE — Progress Notes (Signed)
Subjective:  Patient ID: [name], female    DOB: Jun 22, 1946  Age: 75 y.o. MRN: [id number]  CC: Follow-up (4 month f/u)   HPI [name] presents for HTN, DM, GERD f/u  Outpatient Medications Prior to Visit  Medication Sig Dispense Refill   amLODipine (NORVASC) 5 MG tablet Take 1 tablet (5 mg total) by mouth daily. 90 tablet 3   aspirin 81 MG EC tablet Take 81 mg by mouth daily.     bimatoprost (LUMIGAN) 0.01 % SOLN Place 1 drop into both eyes at bedtime.     Blood Glucose Monitoring Suppl (ONETOUCH VERIO FLEX SYSTEM) w/Device KIT 1 each by Does not apply route in the morning, at noon, and at bedtime.      Cholecalciferol (VITAMIN D3) 1.25 MG (50000 UT) CAPS TAKE ONE CAPSULE BY MOUTH EVERY 30 DAYS 3 capsule 3   Cyanocobalamin (NASCOBAL) 500 MCG/0.1ML SOLN USE 1 SPRAY IN 1 NOSTRIL  ONCE A WEEK 3 each 3   denosumab (PROLIA) 60 MG/ML SOLN injection Inject 60 mg into the skin every 6 (six) months. Administer in upper arm, thigh, or abdomen     diphenoxylate-atropine (LOMOTIL) 2.5-0.025 MG tablet TAKE 1 TABLET BY MOUTH FOUR TIMES A DAY AS NEEDED FOR DIARRHEA OR LOOSE STOOLS 60 tablet 3   furosemide (LASIX) 20 MG tablet TAKE 0.5-1 TABLETS (10-20 MG TOTAL) BY MOUTH DAILY AS NEEDED FOR EDEMA (FOR LEG SWELLING). 90 tablet 1   JARDIANCE 10 MG TABS tablet TAKE 1 TABLET (10 MG TOTAL) BY MOUTH DAILY WITH BREAKFAST. 90 tablet 0   KLOR-CON M20 20 MEQ tablet TAKE 1 TABLET BY MOUTH EVERY DAY 90 tablet 3   OneTouch Delica Lancets 11X MISC USE AS DIRECTED TO CHECK BLOOD SUGAR IN THE MORNING, AT NOON, AND AT BEDTIME 300 each 2   ONETOUCH VERIO test strip USE ONETOUCH VERIO TEST STRIPS AS INSTRUCTED TO CHECK BLOOD SUGAR THREE TIMES DAILY. 300 strip 1   propranolol ER (INDERAL LA) 60 MG 24 hr capsule Take 1 capsule (60 mg total) by mouth 2 (two) times daily. (Patient taking differently: Take 60 mg by mouth daily.) 180 capsule 3   RABEprazole (ACIPHEX) 20 MG tablet TAKE 1 TABLET BY MOUTH EVERY DAY  90 tablet 1   timolol (TIMOPTIC) 0.5 % ophthalmic solution Place 1 drop into both eyes 2 (two) times daily.     ipratropium (ATROVENT) 0.06 % nasal spray Place 2 sprays into the nose 3 (three) times daily. (Patient not taking: Reported on 09/08/2020) 15 mL 2   Methylcellulose, Laxative, (CITRUCEL PO) Take 17 g by mouth daily. Mix 17grams (1 tbsp) with 3-6 tbsp of water and drink daily. (Patient not taking: No sig reported)     triamcinolone cream (KENALOG) 0.1 % Apply 1 application topically 2 (two) times daily as needed (ear bacteria). (Patient not taking: Reported on 03/17/2021)     No facility-administered medications prior to visit.    ROS: Review of Systems  Constitutional:  Negative for activity change, appetite change, chills, fatigue and unexpected weight change.  HENT:  Negative for congestion, mouth sores and sinus pressure.   Eyes:  Negative for visual disturbance.  Respiratory:  Negative for cough and chest tightness.   Gastrointestinal:  Negative for abdominal pain and nausea.  Genitourinary:  Negative for difficulty urinating, frequency and vaginal pain.  Musculoskeletal:  Negative for back pain and gait problem.  Skin:  Negative for pallor and rash.  Neurological:  Negative for dizziness, tremors, weakness,  numbness and headaches.  Psychiatric/Behavioral:  Negative for confusion, sleep disturbance and suicidal ideas. The patient is nervous/anxious.    Objective:  BP (!) 152/70 (BP Location: Left Arm)   Pulse 67   Temp 98.1 F (36.7 C) (Oral)   Ht $R'5\' 4"'Jw$  (1.626 m)   Wt 166 lb 3.2 oz (75.4 kg)   SpO2 97%   BMI 28.53 kg/m   BP Readings from Last 3 Encounters:  03/17/21 (!) 152/70  01/06/21 (!) 148/68  12/23/20 122/64    Wt Readings from Last 3 Encounters:  03/17/21 166 lb 3.2 oz (75.4 kg)  01/06/21 166 lb (75.3 kg)  12/23/20 163 lb 6.4 oz (74.1 kg)    Physical Exam Constitutional:      General: She is not in acute distress.    Appearance: She is well-developed.  She is obese.  HENT:     Head: Normocephalic.     Right Ear: External ear normal.     Left Ear: External ear normal.     Nose: Nose normal.  Eyes:     General:        Right eye: No discharge.        Left eye: No discharge.     Conjunctiva/sclera: Conjunctivae normal.     Pupils: Pupils are equal, round, and reactive to light.  Neck:     Thyroid: No thyromegaly.     Vascular: No JVD.     Trachea: No tracheal deviation.  Cardiovascular:     Rate and Rhythm: Normal rate and regular rhythm.     Heart sounds: Normal heart sounds.  Pulmonary:     Effort: No respiratory distress.     Breath sounds: No stridor. No wheezing.  Abdominal:     General: Bowel sounds are normal. There is no distension.     Palpations: Abdomen is soft. There is no mass.     Tenderness: There is no abdominal tenderness. There is no guarding or rebound.  Musculoskeletal:        General: No tenderness.     Cervical back: Normal range of motion and neck supple. No rigidity.  Lymphadenopathy:     Cervical: No cervical adenopathy.  Skin:    Findings: No erythema or rash.  Neurological:     Mental Status: She is oriented to person, place, and time.     Cranial Nerves: No cranial nerve deficit.     Motor: No abnormal muscle tone.     Coordination: Coordination normal.     Deep Tendon Reflexes: Reflexes normal.  Psychiatric:        Behavior: Behavior normal.        Thought Content: Thought content normal.        Judgment: Judgment normal.    Lab Results  Component Value Date   WBC 6.3 04/02/2018   HGB 11.2 (L) 04/02/2018   HCT 33.0 (L) 04/02/2018   PLT 193 04/02/2018   GLUCOSE 103 (H) 11/06/2020   CHOL 163 11/06/2020   TRIG 113.0 11/06/2020   HDL 49.70 11/06/2020   LDLCALC 91 11/06/2020   ALT 14 11/06/2020   AST 18 11/06/2020   NA 143 11/06/2020   K 3.3 (L) 11/06/2020   CL 107 11/06/2020   CREATININE 1.01 11/06/2020   BUN 20 11/06/2020   CO2 26 11/06/2020   TSH 1.50 07/23/2018   HGBA1C 5.6  11/06/2020   MICROALBUR 8.3 (H) 02/19/2020    MM 3D SCREEN BREAST BILATERAL  Result Date: 02/20/2021 CLINICAL DATA:  Screening. EXAM: DIGITAL SCREENING BILATERAL MAMMOGRAM WITH TOMOSYNTHESIS AND CAD TECHNIQUE: Bilateral screening digital craniocaudal and mediolateral oblique mammograms were obtained. Bilateral screening digital breast tomosynthesis was performed. The images were evaluated with computer-aided detection. COMPARISON:  Previous exam(s). ACR Breast Density Category c: The breast tissue is heterogeneously dense, which may obscure small masses. FINDINGS: There are no findings suspicious for malignancy. IMPRESSION: No mammographic evidence of malignancy. A result letter of this screening mammogram will be mailed directly to the patient. RECOMMENDATION: Screening mammogram in one year. (Code:SM-B-01Y) BI-RADS CATEGORY  1: Negative. Electronically Signed   By: Everlean Alstrom M.D.   On: 02/20/2021 10:45    Assessment & Plan:    Walker Kehr, MD

## 2021-03-17 NOTE — Assessment & Plan Note (Signed)
On Nascobal

## 2021-03-17 NOTE — Assessment & Plan Note (Addendum)
Continue on Jardiance

## 2021-03-17 NOTE — Assessment & Plan Note (Signed)
Chronic sx's 

## 2021-03-17 NOTE — Assessment & Plan Note (Signed)
No change 

## 2021-03-17 NOTE — Assessment & Plan Note (Signed)
On KCl 

## 2021-03-21 NOTE — Assessment & Plan Note (Signed)
Continue with Aciphex daily

## 2021-04-07 ENCOUNTER — Telehealth: Payer: Self-pay | Admitting: Internal Medicine

## 2021-04-07 NOTE — Telephone Encounter (Signed)
Patient calling to report she is covid+ Fever, sore throat, cough Her husband recently tested positive as well  Requesting antiviral and advice

## 2021-04-08 ENCOUNTER — Telehealth (INDEPENDENT_AMBULATORY_CARE_PROVIDER_SITE_OTHER): Payer: Medicare Other | Admitting: Family Medicine

## 2021-04-08 ENCOUNTER — Encounter: Payer: Self-pay | Admitting: Family Medicine

## 2021-04-08 VITALS — Temp 98.8°F | Wt 163.5 lb

## 2021-04-08 DIAGNOSIS — U071 COVID-19: Secondary | ICD-10-CM | POA: Diagnosis not present

## 2021-04-08 MED ORDER — MOLNUPIRAVIR EUA 200MG CAPSULE: 4.0000 | ORAL_CAPSULE | Freq: Two times a day (BID) | ORAL | 0 refills | Status: AC

## 2021-04-08 MED ORDER — BENZONATATE 100 MG PO CAPS: 100.0000 mg | ORAL_CAPSULE | Freq: Three times a day (TID) | ORAL | 0 refills | Status: DC | PRN

## 2021-04-08 NOTE — Progress Notes (Signed)
Virtual Visit via Video Note  I connected with [name]  on 04/08/21 at  4:20 PM EDT by a video enabled telemedicine application and verified that I am speaking with the correct person using two identifiers.  Location patient: home, [place] Location provider:work or home office Persons participating in the virtual visit: patient, provider, patient's husband  I discussed the limitations of evaluation and management by telemedicine and the availability of in person appointments. The patient expressed understanding and agreed to proceed.   HPI:  Acute telemedicine visit for : -Onset: 2 days ago; tested positive for covid yesterday -husband was diagnosed with Covid last weekend -Symptoms include: nasal congestion, sore throat, cough, R ear popping -Denies:fever, CP, SOB, NVD, inability to eat/drink/get out bed -Has tried: delsym -Pertinent past medical history:see below -Pertinent medication allergies:  Allergies  Allergen Reactions   Actonel [Risedronate Sodium]     Upset stomach   Boniva [Ibandronate Sodium]     cramp   Calcium Channel Blockers     Upset stomach   Chlorthalidone     Diarrhea per pt   Compazine     Daughter reacts to compazine/pt does not want to take   Lyrica [Pregabalin]     Dizzy    Tape     redness  -COVID-19 vaccine status: 2 doses + 1 booster -last labs done 5 months ago with GFR 54  ROS: See pertinent positives and negatives per HPI.  Past Medical History:  Diagnosis Date   Adenomatous colon polyp 04/08/2011   Anemia    Anxiety    Cataract    Diabetes mellitus without complication ([name])    "pre-diabetic" per pt   Essential hypertension 08/10/2007   Chronic  On Catapress (per Dr Ubaldo Glassing) - increase to bid - d/c Hydralazine prn per Dr Caryl Comes d/c 3/19: Increase Inderal LA to bid   GERD (gastroesophageal reflux disease)    Glaucoma (increased eye pressure)    Heart murmur    HTN (hypertension)    IBS (irritable bowel syndrome)    LBP (low back pain)     Menopause    Osteoporosis    Ovarian cancer ([place]) 09/2008   Dr Gwyneth Revels   Pancreatic cyst    Vitamin B12 deficiency    Vitamin D deficiency     Past Surgical History:  Procedure Laterality Date   ABDOMINAL HYSTERECTOMY     APPENDECTOMY     BLADDER SURGERY  04/09/2020   tack and sling   CHOLECYSTECTOMY N/A 05/15/2017   Procedure: LAPAROSCOPIC CHOLECYSTECTOMY WITH INTRAOPERATIVE CHOLANGIOGRAM AND LYSIS OF ADHESIONS;  Surgeon: Fanny Skates, MD;  Location: [facility];  Service: General;  Laterality: N/A;   COLONOSCOPY  2018   Dr.Perry   ELBOW FRACTURE SURGERY     age 62- left elbow   HEMORRHOID SURGERY  2001   Ovarian Cancer Debulking  09/2008   POLYPECTOMY       Current Outpatient Medications:    amLODipine (NORVASC) 5 MG tablet, Take 1 tablet (5 mg total) by mouth daily., Disp: 90 tablet, Rfl: 3   aspirin 81 MG EC tablet, Take 81 mg by mouth daily., Disp: , Rfl:    benzonatate (TESSALON PERLES) 100 MG capsule, Take 1 capsule (100 mg total) by mouth 3 (three) times daily as needed., Disp: 20 capsule, Rfl: 0   bimatoprost (LUMIGAN) 0.01 % SOLN, Place 1 drop into both eyes at bedtime., Disp: , Rfl:    Blood Glucose Monitoring Suppl (Meno) w/Device KIT, 1 each by  Does not apply route in the morning, at noon, and at bedtime. , Disp: , Rfl:    Cholecalciferol (VITAMIN D3) 1.25 MG (50000 UT) CAPS, TAKE ONE CAPSULE BY MOUTH EVERY 30 DAYS, Disp: 3 capsule, Rfl: 3   Cyanocobalamin (NASCOBAL) 500 MCG/0.1ML SOLN, USE 1 SPRAY IN 1 NOSTRIL  ONCE A WEEK, Disp: 3 each, Rfl: 3   denosumab (PROLIA) 60 MG/ML SOLN injection, Inject 60 mg into the skin every 6 (six) months. Administer in upper arm, thigh, or abdomen, Disp: , Rfl:    diphenoxylate-atropine (LOMOTIL) 2.5-0.025 MG tablet, TAKE 1 TABLET BY MOUTH FOUR TIMES A DAY AS NEEDED FOR DIARRHEA OR LOOSE STOOLS, Disp: 60 tablet, Rfl: 3   furosemide (LASIX) 20 MG tablet, TAKE 0.5-1 TABLETS (10-20 MG TOTAL) BY MOUTH DAILY AS NEEDED FOR  EDEMA (FOR LEG SWELLING)., Disp: 90 tablet, Rfl: 1   JARDIANCE 10 MG TABS tablet, TAKE 1 TABLET (10 MG TOTAL) BY MOUTH DAILY WITH BREAKFAST., Disp: 90 tablet, Rfl: 0   KLOR-CON M20 20 MEQ tablet, TAKE 1 TABLET BY MOUTH EVERY DAY, Disp: 90 tablet, Rfl: 3   molnupiravir EUA 200 mg CAPS, Take 4 capsules (800 mg total) by mouth 2 (two) times daily for 5 days., Disp: 40 capsule, Rfl: 0   OneTouch Delica Lancets 92E MISC, USE AS DIRECTED TO CHECK BLOOD SUGAR IN THE MORNING, AT NOON, AND AT BEDTIME, Disp: 300 each, Rfl: 2   ONETOUCH VERIO test strip, USE ONETOUCH VERIO TEST STRIPS AS INSTRUCTED TO CHECK BLOOD SUGAR THREE TIMES DAILY., Disp: 300 strip, Rfl: 1   propranolol ER (INDERAL LA) 60 MG 24 hr capsule, Take 1 capsule (60 mg total) by mouth 2 (two) times daily. (Patient taking differently: Take 60 mg by mouth daily.), Disp: 180 capsule, Rfl: 3   RABEprazole (ACIPHEX) 20 MG tablet, TAKE 1 TABLET BY MOUTH EVERY DAY, Disp: 90 tablet, Rfl: 1   timolol (TIMOPTIC) 0.5 % ophthalmic solution, Place 1 drop into both eyes 2 (two) times daily., Disp: , Rfl:   EXAM:  VITALS per patient if applicable:  GENERAL: alert, oriented, appears well and in no acute distress  HEENT: atraumatic, conjunttiva clear, no obvious abnormalities on inspection of external nose and ears  NECK: normal movements of the head and neck  LUNGS: on inspection no signs of respiratory distress, breathing rate appears normal, no obvious gross SOB, gasping or wheezing  CV: no obvious cyanosis  MS: moves all visible extremities without noticeable abnormality  PSYCH/NEURO: pleasant and cooperative, no obvious depression or anxiety, speech and thought processing grossly intact  ASSESSMENT AND PLAN:  Discussed the following assessment and plan:  COVID-19   Discussed treatment options (infusions and oral options and risk of drug interactions), ideal treatment window, potential complications, isolation and precautions for COVID-19.   Discussed possibility of rebound with antivirals and the need to reisolate if it should occur for 5 days. Checked for/reviewed any labs done in the last 90 days with GFR listed in HPI if available. She had labs about 5 months ago and suggested labs would be best if she wanted to do Paxlovid. She did not like that she may need to adjust BP medication if taking Paxlovid and preferred to do molnupiravir. After lengthy discussion, the patient opted for treatment with molnupiravir due to being higher risk for complications of covid or severe disease and other factors. Discussed EUA status of this drug and the fact that there is preliminary limited knowledge of risks/interactions/side effects per EUA document vs  possible benefits and precautions. This information was shared with patient during the visit and also was provided in patient instructions. Also, advised that patient discuss risks/interactions and use with pharmacist/treatment team as well. The patient did want a prescription for cough, Tessalon Rx sent.  Other symptomatic care measures summarized in patient instructions. Scheduled follow up with PCP offered:opted for prn follow up with PCP office, [place] or [facility] Advised to seek prompt in person care if worsening, new symptoms arise, or if is not improving with treatment. Discussed options for inperson care if PCP office not available. Did let this patient know that I only do telemedicine on Tuesdays and Thursdays for [place]. Advised to schedule follow up visit with PCP or [facility] if any further questions or concerns to avoid delays in care.   I discussed the assessment and treatment plan with the patient. The patient was provided an opportunity to ask questions and all were answered. The patient agreed with the plan and demonstrated an understanding of the instructions.     Lucretia Kern, DO

## 2021-04-08 NOTE — Patient Instructions (Signed)
HOME CARE TIPS:   -I sent the medication(s) we discussed to your pharmacy: Meds ordered this encounter  Medications   molnupiravir EUA 200 mg CAPS    Sig: Take 4 capsules (800 mg total) by mouth 2 (two) times daily for 5 days.    Dispense:  40 capsule    Refill:  0   benzonatate (TESSALON PERLES) 100 MG capsule    Sig: Take 1 capsule (100 mg total) by mouth 3 (three) times daily as needed.    Dispense:  20 capsule    Refill:  0     -I sent in the [facility] treatment or referral you requested per our discussion. Please see the information provided below and discuss further with the pharmacist/treatment team.   -If taking an antiviral for covid19, there is a chance of rebound illness after finishing your treatment. If you become sick again please isolate for an additional 5 days.   -can use tylenol if needed for fevers, aches and pains per instructions  -can use nasal saline a few times per day if you have nasal congestion  -stay hydrated, drink plenty of fluids and eat small healthy meals - avoid dairy  -can take 1000 IU (38mg) Vit D3 and 100-500 mg of Vit C daily per instructions  -If the Covid test is positive, check out the [facility] for more information on home care, transmission and treatment for COVID19  -follow up with your doctor in 2-3 days unless improving and feeling better  -stay home while sick, except to seek medical care. If you have COVID19, ideally it would be best to stay home for a full 10 days since the onset of symptoms PLUS one day of no fever and feeling better. Wear a good mask that fits snugly (such as N95 or KN95) if around others to reduce the risk of transmission.  It was nice to meet you today, and I really hope you are feeling better soon. I help [place] out with telemedicine visits on Tuesdays and Thursdays and am available for visits on those days. If you have any concerns or questions following this visit please schedule a follow up visit with  your Primary Care doctor or seek care at a local urgent care clinic to avoid delays in care.    Seek in person care or schedule a follow up video visit promptly if your symptoms worsen, new concerns arise or you are not improving with treatment. Call 911 and/or seek emergency care if your symptoms are severe or life threatening.    Fact Sheet for Patients And Caregivers Emergency Use Authorization (EUA) Of LAGEVRIOT (molnupiravir) capsules For Coronavirus Disease 2019 (COVID-19)  What is the most important information I should know about LAGEVRIO? LAGEVRIO may cause serious side effects, including: ? LAGEVRIO may cause harm to your unborn baby. It is not known if LAGEVRIO will harm your baby if you take LAGEVRIO during pregnancy. o LAGEVRIO is not recommended for use in pregnancy. o LAGEVRIO has not been studied in pregnancy. LAGEVRIO was studied in pregnant animals only. When LAGEVRIO was given to pregnant animals, LAGEVRIO caused harm to their unborn babies. o You and your healthcare provider may decide that you should take LAGEVRIO during pregnancy if there are no other COVID-19 treatment options approved or authorized by the FDA that are accessible or clinically appropriate for you. o If you and your healthcare provider decide that you should take LAGEVRIO during pregnancy, you and your healthcare provider should discuss the known and  potential benefits and the potential risks of taking LAGEVRIO during pregnancy. For individuals who are able to become pregnant: ? You should use a reliable method of birth control (contraception) consistently and correctly during treatment with LAGEVRIO and for 4 days after the last dose of LAGEVRIO. Talk to your healthcare provider about reliable birth control methods. ? Before starting treatment with [facility] your healthcare provider may do a pregnancy test to see if you are pregnant before starting treatment with LAGEVRIO. ? Tell your healthcare  provider right away if you become pregnant or think you may be pregnant during treatment with LAGEVRIO. Pregnancy Surveillance Program: ? There is a pregnancy surveillance program for individuals who take LAGEVRIO during pregnancy. The purpose of this program is to collect information about the health of you and your baby. Talk to your healthcare provider about how to take part in this program. ? If you take LAGEVRIO during pregnancy and you agree to participate in the pregnancy surveillance program and allow your healthcare provider to share your information with [place], then your healthcare provider will report your use of [place] during pregnancy to [place]. by calling [phone] or PeacefulBlog.es. For individuals who are sexually active with partners who are able to become pregnant: ? It is not known if LAGEVRIO can affect sperm. While the risk is regarded as low, animal studies to fully assess the potential for LAGEVRIO to affect the babies of males treated with LAGEVRIO have not been completed. A reliable method of birth control (contraception) should be used consistently and correctly during treatment with LAGEVRIO and for at least 3 months after the last dose. The risk to sperm beyond 3 months is not known. Studies to understand the risk to sperm beyond 3 months are ongoing. Talk to your healthcare provider about reliable birth control methods. Talk to your healthcare provider if you have questions or concerns about how LAGEVRIO may affect sperm. You are being given this fact sheet because your healthcare provider believes it is necessary to provide you with LAGEVRIO for the treatment of adults with mild-to-moderate coronavirus disease 2019 (COVID-19) with positive results of direct SARS-CoV-2 viral testing, and who are at high risk for progression to severe COVID-19 including hospitalization or death, and for whom other COVID-19  treatment options approved or authorized by the FDA are not accessible or clinically appropriate. The U.S. Food and Drug Administration (FDA) has issued an Emergency Use Authorization (EUA) to make LAGEVRIO available during the COVID-19 pandemic (for more details about an EUA please see "What is an Emergency Use Authorization?" at the end of this document). LAGEVRIO is not an FDA-approved medicine in the [place]. Read this Fact Sheet for information about LAGEVRIO. Talk to your healthcare provider about your options if you have any questions. It is your choice to take LAGEVRIO.  What is COVID-19? COVID-19 is caused by a virus called a coronavirus. You can get COVID-19 through close contact with another person who has the virus. COVID-19 illnesses have ranged from very mild-to-severe, including illness resulting in death. While information so far suggests that most COVID-19 illness is mild, serious illness can happen and may cause some of your other medical conditions to become worse. Older people and people of all ages with severe, long lasting (chronic) medical conditions like heart disease, lung disease and diabetes, for example seem to be at higher risk of being hospitalized for COVID-19.  What is LAGEVRIO? LAGEVRIO is an investigational medicine used to treat  mild-to-moderate COVID-19 in adults: ? with positive results of direct SARS-CoV-2 viral testing, and ? who are at high risk for progression to severe COVID-19 including hospitalization or death, and for whom other COVID-19 treatment options approved or authorized by the FDA are not accessible or clinically appropriate. The FDA has authorized the emergency use of LAGEVRIO for the treatment of mild-tomoderate COVID-19 in adults under an EUA. For more information on EUA, see the "What is an Emergency Use Authorization (EUA)?" section at the end of this Fact Sheet. LAGEVRIO is not authorized: ? for use in people less than 60  years of age. ? for prevention of COVID-19. ? for people needing hospitalization for COVID-19. ? for use for longer than 5 consecutive days.  What should I tell my healthcare provider before I take LAGEVRIO? Tell your healthcare provider if you: ? Have any allergies ? Are breastfeeding or plan to breastfeed ? Have any serious illnesses ? Are taking any medicines (prescription, over-the-counter, vitamins, or herbal products).  How do I take LAGEVRIO? ? Take LAGEVRIO exactly as your healthcare provider tells you to take it. ? Take 4 capsules of LAGEVRIO every 12 hours (for example, at 8 am and at 8 pm) ? Take LAGEVRIO for 5 days. It is important that you complete the full 5 days of treatment with LAGEVRIO. Do not stop taking LAGEVRIO before you complete the full 5 days of treatment, even if you feel better. ? Take LAGEVRIO with or without food. ? You should stay in isolation for as long as your healthcare provider tells you to. Talk to your healthcare provider if you are not sure about how to properly isolate while you have COVID-19. ? Swallow LAGEVRIO capsules whole. Do not open, break, or crush the capsules. If you cannot swallow capsules whole, tell your healthcare provider. ? What to do if you miss a dose: o If it has been less than 10 hours since the missed dose, take it as soon as you remember o If it has been more than 10 hours since the missed dose, skip the missed dose and take your dose at the next scheduled time. ? Do not double the dose of LAGEVRIO to make up for a missed dose.  What are the important possible side effects of LAGEVRIO? ? See, "What is the most important information I should know about LAGEVRIO?" ? Allergic Reactions. Allergic reactions can happen in people taking LAGEVRIO, even after only 1 dose. Stop taking LAGEVRIO and call your healthcare provider right away if you get any of the following symptoms of an allergic reaction: o hives o rapid  heartbeat o trouble swallowing or breathing o swelling of the mouth, lips, or face o throat tightness o hoarseness o skin rash The most common side effects of LAGEVRIO are: ? diarrhea ? nausea ? dizziness These are not all the possible side effects of LAGEVRIO. Not many people have taken LAGEVRIO. Serious and unexpected side effects may happen. This medicine is still being studied, so it is possible that all of the risks are not known at this time.  What other treatment choices are there?  Veklury (remdesivir) is FDA-approved as an intravenous (IV) infusion for the treatment of mildto-moderate ASTMH-96 in certain adults and children. Talk with your doctor to see if Marijean Heath is appropriate for you. Like LAGEVRIO, FDA may also allow for the emergency use of other medicines to treat people with COVID-19. Go to LacrosseProperties.si for more information. It is your choice to be treated  or not to be treated with LAGEVRIO. Should you decide not to take it, it will not change your standard medical care.  What if I am breastfeeding? Breastfeeding is not recommended during treatment with LAGEVRIO and for 4 days after the last dose of LAGEVRIO. If you are breastfeeding or plan to breastfeed, talk to your healthcare provider about your options and specific situation before taking LAGEVRIO.  How do I report side effects with LAGEVRIO? Contact your healthcare provider if you have any side effects that bother you or do not go away. Report side effects to FDA MedWatch at SmoothHits.hu or call 1-800-FDA-1088 (1- 612-521-7362).  How should I store [place]? ? Store LAGEVRIO capsules at room temperature between 69F to 10F (20C to 25C). ? Keep LAGEVRIO and all medicines out of the reach of children and pets. How can I learn more about COVID-19? ? Ask your healthcare provider. ? Visit  SeekRooms.co.uk ? Contact your local or state public health department. ? Call [place] at [phone] (toll free in the [place] ? Visit www.molnupiravir.com  What Is an Emergency Use Authorization (EUA)? The [place] FDA has made [place] available under an emergency access mechanism called an Emergency Use Authorization (EUA) The EUA is supported by a Presenter, broadcasting Health and Human Service (HHS) declaration that circumstances exist to justify emergency use of drugs and biological products during the COVID-19 pandemic. LAGEVRIO for the treatment of mild-to-moderate COVID-19 in adults with positive results of direct SARS-CoV-2 viral testing, who are at high risk for progression to severe COVID-19, including hospitalization or death, and for whom alternative COVID-19 treatment options approved or authorized by FDA are not accessible or clinically appropriate, has not undergone the same type of review as an FDA-approved product. In issuing an EUA under the THYHO-88 public health emergency, the FDA has determined, among other things, that based on the total amount of scientific evidence available including data from adequate and well-controlled clinical trials, if available, it is reasonable to believe that the product may be effective for diagnosing, treating, or preventing COVID-19, or a serious or life-threatening disease or condition caused by COVID-19; that the known and potential benefits of the product, when used to diagnose, treat, or prevent such disease or condition, outweigh the known and potential risks of such product; and that there are no adequate, approved, and available alternatives.  All of these criteria must be met to allow for the product to be used in the treatment of patients during the COVID-19 pandemic. The EUA for LAGEVRIO is in effect for the duration of the COVID-19 declaration justifying emergency use of LAGEVRIO, unless terminated or  revoked (after which LAGEVRIO may no longer be used under the EUA). For patent information: http://rogers.info/ Copyright  2021-2022 [employer] [place], [place] and its affiliates. All rights reserved. [id number] Revised: March 2022

## 2021-04-08 NOTE — Telephone Encounter (Signed)
   Spouse calling to report patient has worsening cough. Virtual appointment made for today with Dr Maudie Mercury Mr Emick is still seeking advice from Dr [name]

## 2021-04-09 NOTE — Telephone Encounter (Signed)
Noted. Thanks.

## 2021-04-13 ENCOUNTER — Other Ambulatory Visit: Payer: Self-pay | Admitting: Internal Medicine

## 2021-04-13 ENCOUNTER — Other Ambulatory Visit: Payer: Self-pay | Admitting: Endocrinology

## 2021-05-18 ENCOUNTER — Other Ambulatory Visit (INDEPENDENT_AMBULATORY_CARE_PROVIDER_SITE_OTHER): Payer: Medicare Other

## 2021-05-18 ENCOUNTER — Other Ambulatory Visit: Payer: Self-pay

## 2021-05-18 DIAGNOSIS — E1169 Type 2 diabetes mellitus with other specified complication: Secondary | ICD-10-CM

## 2021-05-18 DIAGNOSIS — E669 Obesity, unspecified: Secondary | ICD-10-CM | POA: Diagnosis not present

## 2021-05-18 LAB — HEMOGLOBIN A1C: Hgb A1c MFr Bld: 5.6 % (ref 4.6–6.5)

## 2021-05-18 LAB — BASIC METABOLIC PANEL
BUN: 24 mg/dL — ABNORMAL HIGH (ref 6–23)
CO2: 29 mEq/L (ref 19–32)
Calcium: 9.8 mg/dL (ref 8.4–10.5)
Chloride: 104 mEq/L (ref 96–112)
Creatinine, Ser: 1.16 mg/dL (ref 0.40–1.20)
GFR: 46.14 mL/min — ABNORMAL LOW (ref >60.00)
Glucose, Bld: 99 mg/dL (ref 70–99)
Potassium: 3.2 mEq/L — ABNORMAL LOW (ref 3.5–5.1)
Sodium: 142 mEq/L (ref 135–145)

## 2021-05-20 ENCOUNTER — Other Ambulatory Visit: Payer: Self-pay

## 2021-05-20 ENCOUNTER — Ambulatory Visit (INDEPENDENT_AMBULATORY_CARE_PROVIDER_SITE_OTHER): Payer: Medicare Other | Admitting: Endocrinology

## 2021-05-20 ENCOUNTER — Encounter: Payer: Self-pay | Admitting: Endocrinology

## 2021-05-20 VITALS — BP 156/82 | HR 58 | Ht 64.0 in | Wt 164.8 lb

## 2021-05-20 DIAGNOSIS — E876 Hypokalemia: Secondary | ICD-10-CM | POA: Diagnosis not present

## 2021-05-20 DIAGNOSIS — E1169 Type 2 diabetes mellitus with other specified complication: Secondary | ICD-10-CM

## 2021-05-20 DIAGNOSIS — E669 Obesity, unspecified: Secondary | ICD-10-CM | POA: Diagnosis not present

## 2021-05-20 NOTE — Patient Instructions (Addendum)
Take potassium daily   Increase water intake  Walk daily

## 2021-05-20 NOTE — Progress Notes (Signed)
Patient ID: [name], female   DOB: September 09, 1945, 75 y.o.   MRN: [id number]            Reason for Appointment: Follow up of diabetes  Primary care physician: [name]  History of Present Illness:      Previously had A1c levels in the prediabetic range and had been at baseline upper normal around 6.0  She was evaluated with a glucose tolerance test in 10/2015 which showed the following: Fasting glucose 122, one-hour glucose 202 hour glucose 147  She was referred to the dietitian for meal planning and advised weight loss  Recent history:  Her A1c has been ranging between 5.7 -6.4 mostly  This is same at 5.6  Current medications for diabetes: Jardiance 10 mg daily, started in 11/20  With her having high blood sugars over 125 fasting she was diagnosed with overt diabetes Also has history of background retinopathy  She has been treated with Jardiance 10 mg without side effects With this she has been able to keep maintain some weight loss Weight however in the last 6 months is up 6 pounds Recently has not done any exercise Has checked her blood sugars by rotation at different times both morning and evening For various reasons especially eating out she may have gained some more weight However only once or twice has a postprandial reading around 160 in the evening She has an isolated low reading of 56, not clear if this was accurate She is not motivated to exercise again  Last nutritional consultation was in 04/2018   PRE-MEAL Fasting Lunch Dinner Bedtime Overall  Glucose range: 99-126    56-161  Mean/median: 109    119   POST-MEAL PC Breakfast PC Lunch PC Dinner  Glucose range:     Mean/median:   130   Previously:  PRE-MEAL  morning  10 AM +  PC dinner Bedtime Overall  Glucose range:     124 91-159  Mean/median:  106  122  127  123    Weight history:  Wt Readings from Last 3 Encounters:  05/20/21 164 lb 12.8 oz (74.8 kg)  04/08/21 163 lb 8 oz (74.2 kg)   03/17/21 166 lb 3.2 oz (75.4 kg)    Glycemic levels:   Lab Results  Component Value Date   HGBA1C 5.6 05/18/2021   HGBA1C 5.6 11/06/2020   HGBA1C 5.7 06/29/2020   Lab Results  Component Value Date   MICROALBUR 8.3 (H) 02/19/2020   LDLCALC 91 11/06/2020   CREATININE 1.16 05/18/2021    Other problems: See review of systems   Allergies as of 05/20/2021       Reactions   Actonel [risedronate Sodium]    Upset stomach   Boniva [ibandronate Sodium]    cramp   Calcium Channel Blockers    Upset stomach   Chlorthalidone    Diarrhea per pt   Compazine    Daughter reacts to compazine/pt does not want to take   Lyrica [pregabalin]    Dizzy   Tape    redness        Medication List        Accurate as of May 20, 2021 10:45 AM. If you have any questions, ask your nurse or doctor.          amLODipine 5 MG tablet Commonly known as: NORVASC Take 1 tablet (5 mg total) by mouth daily.   aspirin 81 MG EC tablet Take 81 mg by mouth daily.   benzonatate  100 MG capsule Commonly known as: Tessalon Perles Take 1 capsule (100 mg total) by mouth 3 (three) times daily as needed.   bimatoprost 0.01 % Soln Commonly known as: LUMIGAN Place 1 drop into both eyes at bedtime.   denosumab 60 MG/ML Soln injection Commonly known as: PROLIA Inject 60 mg into the skin every 6 (six) months. Administer in upper arm, thigh, or abdomen   diphenoxylate-atropine 2.5-0.025 MG tablet Commonly known as: LOMOTIL TAKE 1 TABLET BY MOUTH FOUR TIMES A DAY AS NEEDED FOR DIARRHEA OR LOOSE STOOLS   furosemide 20 MG tablet Commonly known as: LASIX TAKE 0.5-1 TABLETS (10-20 MG TOTAL) BY MOUTH DAILY AS NEEDED FOR EDEMA (FOR LEG SWELLING).   Jardiance 10 MG Tabs tablet Generic drug: empagliflozin TAKE 1 TABLET (10 MG TOTAL) BY MOUTH DAILY WITH BREAKFAST.   Klor-Con M20 20 MEQ tablet Generic drug: potassium chloride SA TAKE 1 TABLET BY MOUTH EVERY DAY   Nascobal 500 MCG/0.1ML  Soln Generic drug: Cyanocobalamin USE 1 SPRAY IN 1 NOSTRIL  ONCE A WEEK   OneTouch Delica Lancets 27P Misc USE AS DIRECTED TO CHECK BLOOD SUGAR IN THE MORNING, AT NOON, AND AT BEDTIME   OneTouch Verio Flex System w/Device Kit 1 each by Does not apply route in the morning, at noon, and at bedtime.   OneTouch Verio test strip Generic drug: glucose blood USE ONETOUCH VERIO TEST STRIPS AS INSTRUCTED TO CHECK BLOOD SUGAR THREE TIMES DAILY.   propranolol ER 60 MG 24 hr capsule Commonly known as: INDERAL LA Take 1 capsule (60 mg total) by mouth 2 (two) times daily. What changed: when to take this   RABEprazole 20 MG tablet Commonly known as: ACIPHEX TAKE 1 TABLET BY MOUTH EVERY DAY   timolol 0.5 % ophthalmic solution Commonly known as: TIMOPTIC Place 1 drop into both eyes 2 (two) times daily.   Vitamin D3 1.25 MG (50000 UT) Caps TAKE ONE CAPSULE BY MOUTH EVERY 30 DAYS        Allergies:  Allergies  Allergen Reactions   Actonel [Risedronate Sodium]     Upset stomach   Boniva [Ibandronate Sodium]     cramp   Calcium Channel Blockers     Upset stomach   Chlorthalidone     Diarrhea per pt   Compazine     Daughter reacts to compazine/pt does not want to take   Lyrica [Pregabalin]     Dizzy    Tape     redness    Past Medical History:  Diagnosis Date   Adenomatous colon polyp 04/08/2011   Anemia    Anxiety    Cataract    Diabetes mellitus without complication ([place])    "pre-diabetic" per pt   Essential hypertension 08/10/2007   Chronic  On Catapress (per Dr Ubaldo Glassing) - increase to bid - d/c Hydralazine prn per Dr Caryl Comes d/c 3/19: Increase Inderal LA to bid   GERD (gastroesophageal reflux disease)    Glaucoma (increased eye pressure)    Heart murmur    HTN (hypertension)    IBS (irritable bowel syndrome)    LBP (low back pain)    Menopause    Osteoporosis    Ovarian cancer ([place]) 09/2008   Dr Gwyneth Revels   Pancreatic cyst    Vitamin B12 deficiency    Vitamin D  deficiency     Past Surgical History:  Procedure Laterality Date   ABDOMINAL HYSTERECTOMY     APPENDECTOMY     BLADDER SURGERY  04/09/2020  tack and sling   CHOLECYSTECTOMY N/A 05/15/2017   Procedure: LAPAROSCOPIC CHOLECYSTECTOMY WITH INTRAOPERATIVE CHOLANGIOGRAM AND LYSIS OF ADHESIONS;  Surgeon: Fanny Skates, MD;  Location: [facility];  Service: General;  Laterality: N/A;   COLONOSCOPY  2018   Dr.Perry   ELBOW FRACTURE SURGERY     age 32- left elbow   HEMORRHOID SURGERY  2001   Ovarian Cancer Debulking  09/2008   POLYPECTOMY      Family History  Problem Relation Age of Onset   Alzheimer's disease Mother 78   Other Mother 44       TAH for excessive bleeding   Lung cancer Father        smoker; metastasis to stomach and other areas   Heart attack Maternal Uncle    Heart disease Maternal Uncle    Other Paternal Aunt        stomach issues   Heart Problems Paternal Uncle    Other Maternal Grandmother        stomach issues; +hysterectomy   Heart attack Maternal Grandfather    Heart disease Maternal Grandfather    Infertility Daughter    Stomach cancer Cousin        dx. mid-60s   Other Cousin        stomach issues   Leukemia Cousin 50   Stomach cancer Other    Cancer Cousin        unknown type   Other Cousin        stomach issues   Heart Problems Maternal Uncle    Diabetes Paternal Aunt    Heart Problems Paternal Uncle    Emphysema Paternal Uncle        work exposure   Breast cancer Cousin        dx. late 60s-early 70s   Colon cancer Neg Hx    Rectal cancer Neg Hx    Esophageal cancer Neg Hx     Social History:  reports that she has never smoked. She has never used smokeless tobacco. She reports that she does not drink alcohol and does not use drugs.    Review of Systems     Lipid history: She has not been on any statin drugs, lipids monitored by her PCP    Lab Results  Component Value Date   CHOL 163 11/06/2020   HDL 49.70 11/06/2020   [place] 91  11/06/2020   TRIG 113.0 11/06/2020   CHOLHDL 3 11/06/2020         RETINOPATHY: She is followed by ophthalmologist for background retinopathy  Hypertension followed by PCP and nephrologist  She is on amlodipine and Inderal She does monitor at home and she thinks her blood pressure is not consistently high at home  BP Readings from Last 3 Encounters:  05/20/21 (!) 156/82  03/17/21 (!) 152/70  01/06/21 (!) 148/68     HYPOKALEMIA: Has not taken her potassium supplements recently and potassium is again low  Has been prescribed potassium supplements by her PCP   Lab Results  Component Value Date   CREATININE 1.16 05/18/2021   BUN 24 (H) 05/18/2021   NA 142 05/18/2021   K 3.2 (L) 05/18/2021   CL 104 05/18/2021   CO2 29 05/18/2021     Review of Systems   LABS:  Lab on 05/18/2021  Component Date Value Ref Range Status   Sodium 05/18/2021 142  135 - 145 mEq/L Final   Potassium 05/18/2021 3.2 (A) 3.5 - 5.1 mEq/L Final   Chloride 05/18/2021  104  96 - 112 mEq/L Final   CO2 05/18/2021 29  19 - 32 mEq/L Final   Glucose, Bld 05/18/2021 99  70 - 99 mg/dL Final   BUN 05/18/2021 24 (A) 6 - 23 mg/dL Final   Creatinine, Ser 05/18/2021 1.16  0.40 - 1.20 mg/dL Final   GFR 05/18/2021 46.14 (A) >60.00 mL/min Final   Calculated using the CKD-EPI Creatinine Equation (2021)   Calcium 05/18/2021 9.8  8.4 - 10.5 mg/dL Final   Hgb A1c MFr Bld 05/18/2021 5.6  4.6 - 6.5 % Final   Glycemic Control Guidelines for People with Diabetes:Non Diabetic:  <6%Goal of Therapy: <7%Additional Action Suggested:  >8%     Physical Examination:  BP (!) 156/82   Pulse (!) 58   Ht '5\' 4"'  (1.626 m)   Wt 164 lb 12.8 oz (74.8 kg)   SpO2 99%   BMI 28.29 kg/m     No pedal  edema present    ASSESSMENT/PLAN:  DIABETES with mild obesity and history of mild retinopathy:  She has mild diabetes based on fasting blood sugars over 130  A1c is stable at 5.6   She has benefited from [place] for better  control However has gained a little weight from inconsistent diet Still not motivated to exercise However blood sugars are usually well controlled although averaging about 1 10 in the morning   Continue Jardiance 10 mg daily as before  Hypokalemia: Discussed that this is related to her diarrhea and occasional diuretic use Since she only has some puffiness of her hands and no consistent edema she should try and minimize her diuretics Otherwise she needs to take her potassium consistently daily  Increased blood pressure in the office: She will follow-up with her PCP, may benefit from keeping a record of her blood pressure readings to take to PCP  Statin therapy: She may benefit from this despite normal lipids, will discuss further on her next visit unless started on treatment by PCP    Elayne Snare 05/20/2021, 10:45 AM   Note: This office note was prepared with Dragon voice recognition system technology. Any transcriptional errors that result from this process are unintentional.   Flu vaccine given

## 2021-05-30 ENCOUNTER — Other Ambulatory Visit: Payer: Self-pay | Admitting: Internal Medicine

## 2021-06-09 DIAGNOSIS — H40023 Open angle with borderline findings, high risk, bilateral: Secondary | ICD-10-CM | POA: Diagnosis not present

## 2021-06-09 DIAGNOSIS — H2513 Age-related nuclear cataract, bilateral: Secondary | ICD-10-CM | POA: Diagnosis not present

## 2021-06-22 ENCOUNTER — Telehealth: Payer: Self-pay

## 2021-06-22 NOTE — Telephone Encounter (Signed)
Please advise as the pts husband has states the pt is out of her diphenoxylate-atropine (LOMOTIL) 2.5-0.025 MG tablet and is in need of a rx refill ASAP.

## 2021-06-23 MED ORDER — DIPHENOXYLATE-ATROPINE 2.5-0.025 MG PO TABS: ORAL_TABLET | ORAL | 3 refills | Status: DC

## 2021-06-23 NOTE — Addendum Note (Signed)
Addended by: Cassandria Anger on: 06/23/2021 11:49 PM   Modules accepted: Orders

## 2021-06-23 NOTE — Telephone Encounter (Signed)
OK. Thx

## 2021-07-01 DIAGNOSIS — R3915 Urgency of urination: Secondary | ICD-10-CM | POA: Insufficient documentation

## 2021-07-01 DIAGNOSIS — N3941 Urge incontinence: Secondary | ICD-10-CM | POA: Insufficient documentation

## 2021-07-09 DIAGNOSIS — Z87442 Personal history of urinary calculi: Secondary | ICD-10-CM | POA: Insufficient documentation

## 2021-07-09 DIAGNOSIS — R1031 Right lower quadrant pain: Secondary | ICD-10-CM | POA: Diagnosis not present

## 2021-07-09 DIAGNOSIS — D1801 Hemangioma of skin and subcutaneous tissue: Secondary | ICD-10-CM | POA: Diagnosis not present

## 2021-07-09 DIAGNOSIS — N3281 Overactive bladder: Secondary | ICD-10-CM | POA: Diagnosis not present

## 2021-07-09 DIAGNOSIS — R800 Isolated proteinuria: Secondary | ICD-10-CM | POA: Diagnosis not present

## 2021-07-09 DIAGNOSIS — L821 Other seborrheic keratosis: Secondary | ICD-10-CM | POA: Diagnosis not present

## 2021-07-09 DIAGNOSIS — N3941 Urge incontinence: Secondary | ICD-10-CM | POA: Diagnosis not present

## 2021-07-09 DIAGNOSIS — L570 Actinic keratosis: Secondary | ICD-10-CM | POA: Diagnosis not present

## 2021-07-09 DIAGNOSIS — Z85828 Personal history of other malignant neoplasm of skin: Secondary | ICD-10-CM | POA: Diagnosis not present

## 2021-07-09 DIAGNOSIS — L718 Other rosacea: Secondary | ICD-10-CM | POA: Diagnosis not present

## 2021-07-10 ENCOUNTER — Telehealth: Payer: Self-pay

## 2021-07-10 DIAGNOSIS — M810 Age-related osteoporosis without current pathological fracture: Secondary | ICD-10-CM

## 2021-07-10 NOTE — Telephone Encounter (Signed)
Prolia VOB initiated via [email or web address]  Last OV:  Next OV:  Last Prolia inj: 08/21/20 Next Prolia inj DUE: 02/20/21

## 2021-07-19 ENCOUNTER — Other Ambulatory Visit: Payer: Self-pay | Admitting: Endocrinology

## 2021-08-01 NOTE — Telephone Encounter (Signed)
Prior Auth required ([employer]) for Prolia.  PA PROCESS DETAILS: PA is required. Providers may call Medical Utilization at [phone] to initiate. Forms may be accessed online at [email or web address]

## 2021-08-27 ENCOUNTER — Other Ambulatory Visit: Payer: Self-pay | Admitting: Endocrinology

## 2021-09-09 NOTE — Telephone Encounter (Signed)
Prior Auth initiated via [email or web address] KEY: [id number]

## 2021-09-13 NOTE — Telephone Encounter (Signed)
VOB initiated for 2023.

## 2021-09-22 ENCOUNTER — Encounter: Payer: Self-pay | Admitting: Internal Medicine

## 2021-09-22 ENCOUNTER — Ambulatory Visit (INDEPENDENT_AMBULATORY_CARE_PROVIDER_SITE_OTHER): Payer: Medicare Other | Admitting: Internal Medicine

## 2021-09-22 ENCOUNTER — Other Ambulatory Visit: Payer: Self-pay

## 2021-09-22 VITALS — BP 132/70 | HR 78 | Temp 98.1°F | Resp 18 | Ht 64.0 in | Wt 163.6 lb

## 2021-09-22 DIAGNOSIS — I10 Essential (primary) hypertension: Secondary | ICD-10-CM | POA: Diagnosis not present

## 2021-09-22 DIAGNOSIS — E669 Obesity, unspecified: Secondary | ICD-10-CM

## 2021-09-22 DIAGNOSIS — E1169 Type 2 diabetes mellitus with other specified complication: Secondary | ICD-10-CM | POA: Diagnosis not present

## 2021-09-22 DIAGNOSIS — C5700 Malignant neoplasm of unspecified fallopian tube: Secondary | ICD-10-CM

## 2021-09-22 DIAGNOSIS — R1011 Right upper quadrant pain: Secondary | ICD-10-CM | POA: Diagnosis not present

## 2021-09-22 DIAGNOSIS — K580 Irritable bowel syndrome with diarrhea: Secondary | ICD-10-CM | POA: Diagnosis not present

## 2021-09-22 DIAGNOSIS — M810 Age-related osteoporosis without current pathological fracture: Secondary | ICD-10-CM

## 2021-09-22 LAB — CBC WITH DIFFERENTIAL/PLATELET
Basophils Absolute: 0 10*3/uL (ref 0.0–0.1)
Basophils Relative: 0.6 % (ref 0.0–3.0)
Eosinophils Absolute: 0.1 10*3/uL (ref 0.0–0.7)
Eosinophils Relative: 1 % (ref 0.0–5.0)
HCT: 36.3 % (ref 36.0–46.0)
Hemoglobin: 12.2 g/dL (ref 12.0–15.0)
Lymphocytes Relative: 18.4 % (ref 12.0–46.0)
Lymphs Abs: 1.3 10*3/uL (ref 0.7–4.0)
MCHC: 33.7 g/dL (ref 30.0–36.0)
MCV: 85.5 fl (ref 78.0–100.0)
Monocytes Absolute: 0.6 10*3/uL (ref 0.1–1.0)
Monocytes Relative: 7.7 % (ref 3.0–12.0)
Neutro Abs: 5.2 10*3/uL (ref 1.4–7.7)
Neutrophils Relative %: 72.3 % (ref 43.0–77.0)
Platelets: 239 10*3/uL (ref 150.0–400.0)
RBC: 4.24 Mil/uL (ref 3.87–5.11)
RDW: 13.6 % (ref 11.5–15.5)
WBC: 7.1 10*3/uL (ref 4.0–10.5)

## 2021-09-22 LAB — COMPREHENSIVE METABOLIC PANEL
ALT: 14 U/L (ref 0–35)
AST: 20 U/L (ref 0–37)
Albumin: 4.6 g/dL (ref 3.5–5.2)
Alkaline Phosphatase: 78 U/L (ref 39–117)
BUN: 21 mg/dL (ref 6–23)
CO2: 26 mEq/L (ref 19–32)
Calcium: 9.7 mg/dL (ref 8.4–10.5)
Chloride: 104 mEq/L (ref 96–112)
Creatinine, Ser: 1.23 mg/dL — ABNORMAL HIGH (ref 0.40–1.20)
GFR: 42.91 mL/min — ABNORMAL LOW (ref >60.00)
Glucose, Bld: 103 mg/dL — ABNORMAL HIGH (ref 70–99)
Potassium: 3.6 mEq/L (ref 3.5–5.1)
Sodium: 141 mEq/L (ref 135–145)
Total Bilirubin: 0.6 mg/dL (ref 0.2–1.2)
Total Protein: 8.1 g/dL (ref 6.0–8.3)

## 2021-09-22 LAB — URINALYSIS, ROUTINE W REFLEX MICROSCOPIC
Bilirubin Urine: NEGATIVE
Ketones, ur: NEGATIVE
Leukocytes,Ua: NEGATIVE
Nitrite: NEGATIVE
Specific Gravity, Urine: 1.025 (ref 1.000–1.030)
Total Protein, Urine: 30 — AB
Urine Glucose: >=1000 — AB
Urobilinogen, UA: 0.2 (ref 0.0–1.0)
pH: 6 (ref 5.0–8.0)

## 2021-09-22 MED ORDER — METRONIDAZOLE 500 MG PO TABS: 500.0000 mg | ORAL_TABLET | Freq: Three times a day (TID) | ORAL | 1 refills | Status: DC

## 2021-09-22 MED ORDER — DIPHENOXYLATE-ATROPINE 2.5-0.025 MG PO TABS: ORAL_TABLET | ORAL | 3 refills | Status: DC

## 2021-09-22 MED ORDER — RABEPRAZOLE SODIUM 20 MG PO TBEC: 20.0000 mg | DELAYED_RELEASE_TABLET | Freq: Every day | ORAL | 3 refills | Status: DC

## 2021-09-22 MED ORDER — NASCOBAL 500 MCG/0.1ML NA SOLN: NASAL | 3 refills | Status: DC

## 2021-09-22 MED ORDER — AMLODIPINE BESYLATE 5 MG PO TABS: 5.0000 mg | ORAL_TABLET | Freq: Every day | ORAL | 3 refills | Status: DC

## 2021-09-22 MED ORDER — DENOSUMAB 60 MG/ML ~~LOC~~ SOSY
60.0000 mg | PREFILLED_SYRINGE | Freq: Once | SUBCUTANEOUS | Status: AC
  Administered 2021-09-22: 60 mg via SUBCUTANEOUS

## 2021-09-22 MED ORDER — SACCHAROMYCES BOULARDII 250 MG PO CAPS: 250.0000 mg | ORAL_CAPSULE | Freq: Two times a day (BID) | ORAL | 1 refills | Status: DC

## 2021-09-22 MED ORDER — VITAMIN D3 1.25 MG (50000 UT) PO CAPS: ORAL_CAPSULE | ORAL | 3 refills | Status: DC

## 2021-09-22 NOTE — Assessment & Plan Note (Addendum)
Recurrent  Adhesions and IBS-D vs other. C/o gas. C/o L abd pain [name] saw GYN Dr [name] - had renal US, UA, abd X ray in 11/22 Labs w/CA 125 Obtain CT abd and pelvis if not well in 2-4 wks

## 2021-09-22 NOTE — Addendum Note (Signed)
Addended by: Shirlyn Goltz on: 09/22/2021 02:23 PM   Modules accepted: Orders

## 2021-09-22 NOTE — Assessment & Plan Note (Signed)
On Jardiance

## 2021-09-22 NOTE — Assessment & Plan Note (Signed)
Trial of Flagyl and Florastor Lomotil 1-2 qid prn

## 2021-09-22 NOTE — Progress Notes (Addendum)
Prolia given and tolerated well  Medical screening examination/treatment/procedure(s) were performed by non-physician practitioner and as supervising physician I was immediately available for consultation/collaboration.  I agree with above. Lew Dawes, MD

## 2021-09-22 NOTE — Assessment & Plan Note (Signed)
On Norvasc Check CMET

## 2021-09-22 NOTE — Progress Notes (Signed)
Subjective:  Patient ID: [name], female    DOB: Feb 03, 1946  Age: 76 y.o. MRN: [id number]  CC: 6 month f/u (Pt mentioned that she was having pain in her right ear. Pain has subsided. Pt mentioned that she is still having issues with loose bowels.)   HPI [name] presents for R ear pain, IBS-D, stool leakage at times - not new. C/o gas. C/o L abd pain [name] saw GYN Dr [name] - had renal US, UA, abd X ray in 11/22  Outpatient Medications Prior to Visit  Medication Sig Dispense Refill   aspirin 81 MG EC tablet Take 81 mg by mouth daily.     bimatoprost (LUMIGAN) 0.01 % SOLN Place 1 drop into both eyes at bedtime.     Blood Glucose Monitoring Suppl (ONETOUCH VERIO FLEX SYSTEM) w/Device KIT 1 each by Does not apply route in the morning, at noon, and at bedtime.      denosumab (PROLIA) 60 MG/ML SOLN injection Inject 60 mg into the skin every 6 (six) months. Administer in upper arm, thigh, or abdomen     furosemide (LASIX) 20 MG tablet TAKE 0.5-1 TABLETS (10-20 MG TOTAL) BY MOUTH DAILY AS NEEDED FOR EDEMA (FOR LEG SWELLING). 90 tablet 1   JARDIANCE 10 MG TABS tablet TAKE 1 TABLET (10 MG TOTAL) BY MOUTH DAILY WITH BREAKFAST. 90 tablet 0   KLOR-CON M20 20 MEQ tablet TAKE 1 TABLET BY MOUTH EVERY DAY 90 tablet 3   OneTouch Delica Lancets 51Z MISC USE AS DIRECTED TO CHECK BLOOD SUGAR IN THE MORNING, AT NOON, AND AT BEDTIME 300 each 2   ONETOUCH VERIO test strip USE ONETOUCH VERIO TEST STRIPS AS INSTRUCTED TO CHECK BLOOD SUGAR THREE TIMES DAILY. 300 strip 1   propranolol ER (INDERAL LA) 60 MG 24 hr capsule Take 1 capsule (60 mg total) by mouth 2 (two) times daily. (Patient taking differently: Take 60 mg by mouth daily.) 180 capsule 3   timolol (TIMOPTIC) 0.5 % ophthalmic solution Place 1 drop into both eyes 2 (two) times daily.     amLODipine (NORVASC) 5 MG tablet TAKE 1 TABLET BY MOUTH EVERY DAY 90 tablet 1   Cholecalciferol (VITAMIN D3) 1.25 MG (50000 UT) CAPS TAKE ONE CAPSULE BY  MOUTH EVERY 30 DAYS 3 capsule 3   Cyanocobalamin (NASCOBAL) 500 MCG/0.1ML SOLN USE 1 SPRAY IN 1 NOSTRIL  ONCE A WEEK 3 each 3   diphenoxylate-atropine (LOMOTIL) 2.5-0.025 MG tablet TAKE 1 TABLET BY MOUTH FOUR TIMES A DAY AS NEEDED FOR DIARRHEA OR LOOSE STOOLS 60 tablet 3   RABEprazole (ACIPHEX) 20 MG tablet TAKE 1 TABLET BY MOUTH EVERY DAY 90 tablet 1   benzonatate (TESSALON PERLES) 100 MG capsule Take 1 capsule (100 mg total) by mouth 3 (three) times daily as needed. 20 capsule 0   No facility-administered medications prior to visit.    ROS: Review of Systems  Constitutional:  Negative for activity change, appetite change, chills, fatigue and unexpected weight change.  HENT:  Negative for congestion, mouth sores and sinus pressure.   Eyes:  Negative for visual disturbance.  Respiratory:  Negative for cough and chest tightness.   Gastrointestinal:  Positive for diarrhea. Negative for abdominal pain and nausea.  Genitourinary:  Negative for difficulty urinating, frequency and vaginal pain.  Musculoskeletal:  Negative for back pain and gait problem.  Skin:  Negative for pallor and rash.  Neurological:  Negative for dizziness, tremors, weakness, numbness and headaches.  Psychiatric/Behavioral:  Negative  for confusion and sleep disturbance.    Objective:  BP 132/70    Pulse 78    Temp 98.1 F (36.7 C) (Oral)    Resp 18    Ht '5\' 4"'  (1.626 m)    Wt 163 lb 9.6 oz (74.2 kg)    SpO2 95%    BMI 28.08 kg/m   BP Readings from Last 3 Encounters:  09/22/21 132/70  05/20/21 (!) 156/82  03/17/21 (!) 152/70    Wt Readings from Last 3 Encounters:  09/22/21 163 lb 9.6 oz (74.2 kg)  05/20/21 164 lb 12.8 oz (74.8 kg)  04/08/21 163 lb 8 oz (74.2 kg)    Physical Exam Constitutional:      General: She is not in acute distress.    Appearance: She is well-developed. She is obese.  HENT:     Head: Normocephalic.     Right Ear: External ear normal.     Left Ear: External ear normal.     Nose: Nose  normal.  Eyes:     General:        Right eye: No discharge.        Left eye: No discharge.     Conjunctiva/sclera: Conjunctivae normal.     Pupils: Pupils are equal, round, and reactive to light.  Neck:     Thyroid: No thyromegaly.     Vascular: No JVD.     Trachea: No tracheal deviation.  Cardiovascular:     Rate and Rhythm: Normal rate and regular rhythm.     Heart sounds: Normal heart sounds.  Pulmonary:     Effort: No respiratory distress.     Breath sounds: No stridor. No wheezing.  Abdominal:     General: Bowel sounds are normal. There is no distension.     Palpations: Abdomen is soft. There is no mass.     Tenderness: There is no abdominal tenderness. There is no guarding or rebound.  Musculoskeletal:        General: No tenderness.     Cervical back: Normal range of motion and neck supple. No rigidity.  Lymphadenopathy:     Cervical: No cervical adenopathy.  Skin:    Findings: No erythema or rash.  Neurological:     Cranial Nerves: No cranial nerve deficit.     Motor: No abnormal muscle tone.     Coordination: Coordination normal.     Deep Tendon Reflexes: Reflexes normal.  Psychiatric:        Behavior: Behavior normal.        Thought Content: Thought content normal.        Judgment: Judgment normal.   S/NT No mass   Lab Results  Component Value Date   WBC 6.3 04/02/2018   HGB 11.2 (L) 04/02/2018   HCT 33.0 (L) 04/02/2018   PLT 193 04/02/2018   GLUCOSE 99 05/18/2021   CHOL 163 11/06/2020   TRIG 113.0 11/06/2020   HDL 49.70 11/06/2020   LDLCALC 91 11/06/2020   ALT 14 11/06/2020   AST 18 11/06/2020   NA 142 05/18/2021   K 3.2 (L) 05/18/2021   CL 104 05/18/2021   CREATININE 1.16 05/18/2021   BUN 24 (H) 05/18/2021   CO2 29 05/18/2021   TSH 1.50 07/23/2018   HGBA1C 5.6 05/18/2021   MICROALBUR 8.3 (H) 02/19/2020    MM 3D SCREEN BREAST BILATERAL  Result Date: 02/20/2021 CLINICAL DATA:  Screening. EXAM: DIGITAL SCREENING BILATERAL MAMMOGRAM WITH  TOMOSYNTHESIS AND CAD TECHNIQUE: Bilateral screening digital craniocaudal  and mediolateral oblique mammograms were obtained. Bilateral screening digital breast tomosynthesis was performed. The images were evaluated with computer-aided detection. COMPARISON:  Previous exam(s). ACR Breast Density Category c: The breast tissue is heterogeneously dense, which may obscure small masses. FINDINGS: There are no findings suspicious for malignancy. IMPRESSION: No mammographic evidence of malignancy. A result letter of this screening mammogram will be mailed directly to the patient. RECOMMENDATION: Screening mammogram in one year. (Code:SM-B-01Y) BI-RADS CATEGORY  1: Negative. Electronically Signed   By: Everlean Alstrom M.D.   On: 02/20/2021 10:45    Assessment & Plan:   Problem List Items Addressed This Visit     Diabetes mellitus type 2 in obese ([place])    On Jardiance      Fallopian tube cancer, carcinoma ([place])    Labs w/CA 125 Obtain CT abd and pelvis if not well in 2 wks      Relevant Medications   metroNIDAZOLE (FLAGYL) 500 MG tablet   Other Relevant Orders   CA 125   HTN (hypertension)    On Norvasc Check CMET      Relevant Medications   amLODipine (NORVASC) 5 MG tablet   IBS (irritable bowel syndrome)    Trial of Flagyl and Florastor Lomotil 1-2 qid prn      Relevant Medications   saccharomyces boulardii (FLORASTOR) 250 MG capsule   diphenoxylate-atropine (LOMOTIL) 2.5-0.025 MG tablet   RABEprazole (ACIPHEX) 20 MG tablet   RUQ abdominal pain - Primary    Recurrent  Adhesions and IBS-D vs other. C/o gas. C/o L abd pain [name] saw GYN Dr [name] - had renal US, UA, abd X ray in 11/22 Labs w/CA 125 Obtain CT abd and pelvis if not well in 2-4 wks      Relevant Orders   CBC with Differential/Platelet   Comprehensive metabolic panel   Urinalysis   CA 125      Meds ordered this encounter  Medications   metroNIDAZOLE (FLAGYL) 500 MG tablet    Sig: Take 1 tablet (500 mg  total) by mouth 3 (three) times daily.    Dispense:  30 tablet    Refill:  1   saccharomyces boulardii (FLORASTOR) 250 MG capsule    Sig: Take 1 capsule (250 mg total) by mouth 2 (two) times daily.    Dispense:  60 capsule    Refill:  1   Cyanocobalamin (NASCOBAL) 500 MCG/0.1ML SOLN    Sig: USE 1 SPRAY IN 1 NOSTRIL  ONCE A WEEK    Dispense:  3 each    Refill:  3   diphenoxylate-atropine (LOMOTIL) 2.5-0.025 MG tablet    Sig: TAKE 1 TABLET BY MOUTH FOUR TIMES A DAY AS NEEDED FOR DIARRHEA OR LOOSE STOOLS    Dispense:  60 tablet    Refill:  3    This request is for a new prescription for a controlled substance as required by Federal/State law.[name]   amLODipine (NORVASC) 5 MG tablet    Sig: Take 1 tablet (5 mg total) by mouth daily.    Dispense:  90 tablet    Refill:  3   RABEprazole (ACIPHEX) 20 MG tablet    Sig: Take 1 tablet (20 mg total) by mouth daily.    Dispense:  90 tablet    Refill:  3   Cholecalciferol (VITAMIN D3) 1.25 MG (50000 UT) CAPS    Sig: TAKE ONE CAPSULE BY MOUTH EVERY 30 DAYS    Dispense:  3 capsule    Refill:  3      Follow-up: Return in about 4 weeks (around 10/20/2021) for a follow-up visit.  Walker Kehr, MD

## 2021-09-22 NOTE — Assessment & Plan Note (Signed)
Labs w/CA 125 Obtain CT abd and pelvis if not well in 2 wks

## 2021-09-23 LAB — CA 125: CA 125: 7 U/mL (ref <35)

## 2021-09-28 NOTE — Telephone Encounter (Signed)
Prior auth required for PROLIA  PA PROCESS DETAILS: PA is required & is not on file. Providers may call [phone] to initiate.  Forms may be accessed online at [email or web address]

## 2021-10-08 ENCOUNTER — Other Ambulatory Visit: Payer: Self-pay | Admitting: Endocrinology

## 2021-10-13 NOTE — Telephone Encounter (Signed)
Prior auth initiated via fax form

## 2021-10-20 ENCOUNTER — Other Ambulatory Visit: Payer: Self-pay

## 2021-10-20 ENCOUNTER — Ambulatory Visit (INDEPENDENT_AMBULATORY_CARE_PROVIDER_SITE_OTHER): Payer: Medicare Other | Admitting: Internal Medicine

## 2021-10-20 ENCOUNTER — Encounter: Payer: Self-pay | Admitting: Internal Medicine

## 2021-10-20 DIAGNOSIS — K580 Irritable bowel syndrome with diarrhea: Secondary | ICD-10-CM | POA: Diagnosis not present

## 2021-10-20 DIAGNOSIS — I10 Essential (primary) hypertension: Secondary | ICD-10-CM | POA: Diagnosis not present

## 2021-10-20 DIAGNOSIS — N3281 Overactive bladder: Secondary | ICD-10-CM | POA: Insufficient documentation

## 2021-10-20 DIAGNOSIS — E538 Deficiency of other specified B group vitamins: Secondary | ICD-10-CM

## 2021-10-20 MED ORDER — OXYBUTYNIN CHLORIDE 5 MG PO TABS: 5.0000 mg | ORAL_TABLET | Freq: Three times a day (TID) | ORAL | 3 refills | Status: DC

## 2021-10-20 NOTE — Assessment & Plan Note (Signed)
BP Readings from Last 3 Encounters:  10/20/21 140/86  09/22/21 132/70  05/20/21 (!) 156/82

## 2021-10-20 NOTE — Assessment & Plan Note (Signed)
It is worse. Jardiance may be aggravating Use Ditropan prn  Potential benefits of a long term Ditropan  use as well as potential risks  and complications were explained to the patient and were aknowledged. Ok to reduce Jardiance to 1/2 tab

## 2021-10-20 NOTE — Progress Notes (Signed)
Subjective:  Patient ID: [name], female    DOB: Feb 27, 1946  Age: 76 y.o. MRN: [id number]  CC: No chief complaint on file.   HPI FARIDA MCREYNOLDS presents for diarrhea/gas - better after Flagyl/probiotics (by 50%) C/o OAB nd leakage F/u CRI  Outpatient Medications Prior to Visit  Medication Sig Dispense Refill   amLODipine (NORVASC) 5 MG tablet Take 1 tablet (5 mg total) by mouth daily. 90 tablet 3   aspirin 81 MG EC tablet Take 81 mg by mouth daily.     bimatoprost (LUMIGAN) 0.01 % SOLN Place 1 drop into both eyes at bedtime.     Blood Glucose Monitoring Suppl (ONETOUCH VERIO FLEX SYSTEM) w/Device KIT 1 each by Does not apply route in the morning, at noon, and at bedtime.      Cholecalciferol (VITAMIN D3) 1.25 MG (50000 UT) CAPS TAKE ONE CAPSULE BY MOUTH EVERY 30 DAYS 3 capsule 3   Cyanocobalamin (NASCOBAL) 500 MCG/0.1ML SOLN USE 1 SPRAY IN 1 NOSTRIL  ONCE A WEEK 3 each 3   denosumab (PROLIA) 60 MG/ML SOLN injection Inject 60 mg into the skin every 6 (six) months. Administer in upper arm, thigh, or abdomen     diphenoxylate-atropine (LOMOTIL) 2.5-0.025 MG tablet TAKE 1 TABLET BY MOUTH FOUR TIMES A DAY AS NEEDED FOR DIARRHEA OR LOOSE STOOLS 60 tablet 3   furosemide (LASIX) 20 MG tablet TAKE 0.5-1 TABLETS (10-20 MG TOTAL) BY MOUTH DAILY AS NEEDED FOR EDEMA (FOR LEG SWELLING). 90 tablet 1   JARDIANCE 10 MG TABS tablet TAKE 1 TABLET (10 MG TOTAL) BY MOUTH DAILY WITH BREAKFAST. 90 tablet 0   KLOR-CON M20 20 MEQ tablet TAKE 1 TABLET BY MOUTH EVERY DAY 90 tablet 3   OneTouch Delica Lancets 23J MISC USE AS DIRECTED TO CHECK BLOOD SUGAR IN THE MORNING, AT NOON, AND AT BEDTIME 300 each 2   ONETOUCH VERIO test strip USE ONETOUCH VERIO TEST STRIPS AS INSTRUCTED TO CHECK BLOOD SUGAR THREE TIMES DAILY. 300 strip 1   propranolol ER (INDERAL LA) 60 MG 24 hr capsule Take 1 capsule (60 mg total) by mouth 2 (two) times daily. (Patient taking differently: Take 60 mg by mouth daily.) 180 capsule 3    RABEprazole (ACIPHEX) 20 MG tablet Take 1 tablet (20 mg total) by mouth daily. 90 tablet 3   saccharomyces boulardii (FLORASTOR) 250 MG capsule Take 1 capsule (250 mg total) by mouth 2 (two) times daily. 60 capsule 1   timolol (TIMOPTIC) 0.5 % ophthalmic solution Place 1 drop into both eyes 2 (two) times daily.     metroNIDAZOLE (FLAGYL) 500 MG tablet Take 1 tablet (500 mg total) by mouth 3 (three) times daily. 30 tablet 1   No facility-administered medications prior to visit.    ROS: Review of Systems  Constitutional:  Positive for fatigue. Negative for activity change, appetite change, chills and unexpected weight change.  HENT:  Negative for congestion, mouth sores and sinus pressure.   Eyes:  Negative for visual disturbance.  Respiratory:  Negative for cough and chest tightness.   Gastrointestinal:  Positive for diarrhea. Negative for abdominal pain, nausea and vomiting.  Genitourinary:  Positive for frequency and urgency. Negative for difficulty urinating and vaginal pain.  Musculoskeletal:  Negative for back pain and gait problem.  Skin:  Negative for pallor and rash.  Neurological:  Negative for dizziness, tremors, weakness, numbness and headaches.  Psychiatric/Behavioral:  Negative for confusion and sleep disturbance.    Objective:  BP 140/86 (  BP Location: Left Arm, Patient Position: Sitting, Cuff Size: Large)    Pulse 69    Temp 98.4 F (36.9 C) (Oral)    Ht '5\' 4"'  (1.626 m)    Wt 162 lb (73.5 kg)    SpO2 96%    BMI 27.81 kg/m   BP Readings from Last 3 Encounters:  10/20/21 140/86  09/22/21 132/70  05/20/21 (!) 156/82    Wt Readings from Last 3 Encounters:  10/20/21 162 lb (73.5 kg)  09/22/21 163 lb 9.6 oz (74.2 kg)  05/20/21 164 lb 12.8 oz (74.8 kg)    Physical Exam Constitutional:      General: She is not in acute distress.    Appearance: She is well-developed. She is obese.  HENT:     Head: Normocephalic.     Right Ear: External ear normal.     Left Ear:  External ear normal.     Nose: Nose normal.  Eyes:     General:        Right eye: No discharge.        Left eye: No discharge.     Conjunctiva/sclera: Conjunctivae normal.     Pupils: Pupils are equal, round, and reactive to light.  Neck:     Thyroid: No thyromegaly.     Vascular: No JVD.     Trachea: No tracheal deviation.  Cardiovascular:     Rate and Rhythm: Normal rate and regular rhythm.     Heart sounds: Normal heart sounds.  Pulmonary:     Effort: No respiratory distress.     Breath sounds: No stridor. No wheezing.  Abdominal:     General: Bowel sounds are normal. There is no distension.     Palpations: Abdomen is soft. There is no mass.     Tenderness: There is no abdominal tenderness. There is no guarding or rebound.  Musculoskeletal:        General: No tenderness.     Cervical back: Normal range of motion and neck supple. No rigidity.  Lymphadenopathy:     Cervical: No cervical adenopathy.  Skin:    Findings: No erythema or rash.  Neurological:     Cranial Nerves: No cranial nerve deficit.     Motor: No abnormal muscle tone.     Coordination: Coordination normal.     Deep Tendon Reflexes: Reflexes normal.  Psychiatric:        Behavior: Behavior normal.        Thought Content: Thought content normal.        Judgment: Judgment normal.    Lab Results  Component Value Date   WBC 7.1 09/22/2021   HGB 12.2 09/22/2021   HCT 36.3 09/22/2021   PLT 239.0 09/22/2021   GLUCOSE 103 (H) 09/22/2021   CHOL 163 11/06/2020   TRIG 113.0 11/06/2020   HDL 49.70 11/06/2020   LDLCALC 91 11/06/2020   ALT 14 09/22/2021   AST 20 09/22/2021   NA 141 09/22/2021   K 3.6 09/22/2021   CL 104 09/22/2021   CREATININE 1.23 (H) 09/22/2021   BUN 21 09/22/2021   CO2 26 09/22/2021   TSH 1.50 07/23/2018   HGBA1C 5.6 05/18/2021   MICROALBUR 8.3 (H) 02/19/2020    MM 3D SCREEN BREAST BILATERAL  Result Date: 02/20/2021 CLINICAL DATA:  Screening. EXAM: DIGITAL SCREENING BILATERAL  MAMMOGRAM WITH TOMOSYNTHESIS AND CAD TECHNIQUE: Bilateral screening digital craniocaudal and mediolateral oblique mammograms were obtained. Bilateral screening digital breast tomosynthesis was performed. The images were evaluated with  computer-aided detection. COMPARISON:  Previous exam(s). ACR Breast Density Category c: The breast tissue is heterogeneously dense, which may obscure small masses. FINDINGS: There are no findings suspicious for malignancy. IMPRESSION: No mammographic evidence of malignancy. A result letter of this screening mammogram will be mailed directly to the patient. RECOMMENDATION: Screening mammogram in one year. (Code:SM-B-01Y) BI-RADS CATEGORY  1: Negative. Electronically Signed   By: Everlean Alstrom M.D.   On: 02/20/2021 10:45    Assessment & Plan:   Problem List Items Addressed This Visit     B12 deficiency    On B12      HTN (hypertension)    BP Readings from Last 3 Encounters:  10/20/21 140/86  09/22/21 132/70  05/20/21 (!) 156/82        IBS (irritable bowel syndrome)     Diarrhea/gas - better after Flagyl/probiotics (by 50%)      OAB (overactive bladder)    It is worse. Jardiance may be aggravating Use Ditropan prn  Potential benefits of a long term Ditropan  use as well as potential risks  and complications were explained to the patient and were aknowledged. Ok to reduce Jardiance to 1/2 tab           Meds ordered this encounter  Medications   oxybutynin (DITROPAN) 5 MG tablet    Sig: Take 1 tablet (5 mg total) by mouth 3 (three) times daily.    Dispense:  90 tablet    Refill:  3      Follow-up: Return in about 3 months (around 01/17/2022) for a follow-up visit.  Walker Kehr, MD

## 2021-10-20 NOTE — Assessment & Plan Note (Signed)
On B12 

## 2021-10-20 NOTE — Assessment & Plan Note (Signed)
Diarrhea/gas - better after Flagyl/probiotics (by 50%)

## 2021-11-16 ENCOUNTER — Other Ambulatory Visit (INDEPENDENT_AMBULATORY_CARE_PROVIDER_SITE_OTHER): Payer: Medicare Other

## 2021-11-16 ENCOUNTER — Other Ambulatory Visit: Payer: Self-pay

## 2021-11-16 DIAGNOSIS — E1169 Type 2 diabetes mellitus with other specified complication: Secondary | ICD-10-CM

## 2021-11-16 DIAGNOSIS — E669 Obesity, unspecified: Secondary | ICD-10-CM

## 2021-11-16 LAB — BASIC METABOLIC PANEL
BUN: 24 mg/dL — ABNORMAL HIGH (ref 6–23)
CO2: 23 mEq/L (ref 19–32)
Calcium: 9.1 mg/dL (ref 8.4–10.5)
Chloride: 107 mEq/L (ref 96–112)
Creatinine, Ser: 1.06 mg/dL (ref 0.40–1.20)
GFR: 51.24 mL/min — ABNORMAL LOW (ref >60.00)
Glucose, Bld: 103 mg/dL — ABNORMAL HIGH (ref 70–99)
Potassium: 4 mEq/L (ref 3.5–5.1)
Sodium: 140 mEq/L (ref 135–145)

## 2021-11-16 LAB — HEMOGLOBIN A1C: Hgb A1c MFr Bld: 5.9 % (ref 4.6–6.5)

## 2021-11-16 LAB — MICROALBUMIN / CREATININE URINE RATIO
Creatinine,U: 221.6 mg/dL
Microalb Creat Ratio: 4.7 mg/g (ref 0.0–30.0)
Microalb, Ur: 10.3 mg/dL — ABNORMAL HIGH (ref 0.0–1.9)

## 2021-11-18 ENCOUNTER — Ambulatory Visit (INDEPENDENT_AMBULATORY_CARE_PROVIDER_SITE_OTHER): Payer: Medicare Other | Admitting: Endocrinology

## 2021-11-18 ENCOUNTER — Encounter: Payer: Self-pay | Admitting: Endocrinology

## 2021-11-18 ENCOUNTER — Other Ambulatory Visit: Payer: Self-pay

## 2021-11-18 VITALS — BP 144/76 | HR 66 | Ht 64.0 in | Wt 163.6 lb

## 2021-11-18 DIAGNOSIS — E876 Hypokalemia: Secondary | ICD-10-CM

## 2021-11-18 DIAGNOSIS — E785 Hyperlipidemia, unspecified: Secondary | ICD-10-CM | POA: Diagnosis not present

## 2021-11-18 DIAGNOSIS — E1169 Type 2 diabetes mellitus with other specified complication: Secondary | ICD-10-CM | POA: Diagnosis not present

## 2021-11-18 DIAGNOSIS — E669 Obesity, unspecified: Secondary | ICD-10-CM

## 2021-11-18 NOTE — Progress Notes (Signed)
Patient ID: [name], female   DOB: 1946/03/09, 76 y.o.   MRN: [id number] ? ?       ? ? ? ?Reason for Appointment: Follow up of diabetes ? ?Primary care physician: [name] ? ?History of Present Illness:  ?   ? ?Previously had A1c levels in the prediabetic range and had been at baseline upper normal around 6.0 ? ?She was evaluated with a glucose tolerance test in 10/2015 which showed the following: ?Fasting glucose 122, one-hour glucose 202 hour glucose 147 ? ?She was referred to the dietitian for meal planning and advised weight loss ? ?Recent history: ? ?Her A1c has been ranging between 5.7 -6.4 generally and now 5.9 ? ? ?Current medications for diabetes: Jardiance 10 mg daily, started in 11/20 ? ?With her having high blood sugars over 125 fasting she was diagnosed with overt diabetes ?Also has history of background retinopathy ? ?She has been treated with Jardiance 10 mg consistently ?She is able to keep her blood sugars fairly well controlled with only occasional higher blood sugars at times in the morning but only has a couple of readings over 125 midday or early afternoon, likely postprandial  ?Overall recently has been doing fairly well with watching her carbohydrate and portions  ?Her weight recently has leveled off ?She is not motivated to exercise as before ? ?Last nutritional consultation was in 04/2018 ? ? ?PRE-MEAL Fasting Lunch Dinner Bedtime Overall  ?Glucose range: 93-113    82-128  ?Mean/median: 99    106  ? ?POST-MEAL PC Breakfast PC Lunch PC Dinner  ?Glucose range: 82-127  82-127  ?Mean/median:  118 106  ? ?Previously ? ?PRE-MEAL Fasting Lunch Dinner Bedtime Overall  ?Glucose range: 99-126    56-161  ?Mean/median: 109    119  ? ?POST-MEAL PC Breakfast PC Lunch PC Dinner  ?Glucose range:     ?Mean/median:   130  ? ? ? ?Weight history: ? ?Wt Readings from Last 3 Encounters:  ?11/18/21 163 lb 9.6 oz (74.2 kg)  ?10/20/21 162 lb (73.5 kg)  ?09/22/21 163 lb 9.6 oz (74.2 kg)  ? ? ?Glycemic levels: ?   ?Lab Results  ?Component Value Date  ? HGBA1C 5.9 11/16/2021  ? HGBA1C 5.6 05/18/2021  ? HGBA1C 5.6 11/06/2020  ? ?Lab Results  ?Component Value Date  ? MICROALBUR 10.3 (H) 11/16/2021  ? [place] 91 11/06/2020  ? CREATININE 1.06 11/16/2021  ? ? ?Other problems discussed today: See review of systems ? ? ?Allergies as of 11/18/2021   ? ?   Reactions  ? Actonel [risedronate Sodium]   ? Upset stomach  ? Boniva [ibandronate Sodium]   ? cramp  ? Calcium Channel Blockers   ? Upset stomach  ? Chlorthalidone   ? Diarrhea per pt  ? Compazine   ? Daughter reacts to compazine/pt does not want to take  ? Lyrica [pregabalin]   ? Dizzy  ? Tape   ? redness  ? ?  ? ?  ?Medication List  ?  ? ?  ? Accurate as of November 18, 2021  1:36 PM. If you have any questions, ask your nurse or doctor.  ?  ?  ? ?  ? ?amLODipine 5 MG tablet ?Commonly known as: NORVASC ?Take 1 tablet (5 mg total) by mouth daily. ?  ?aspirin 81 MG EC tablet ?Take 81 mg by mouth daily. ?  ?bimatoprost 0.01 % Soln ?Commonly known as: LUMIGAN ?Place 1 drop into both eyes at bedtime. ?  ?  denosumab 60 MG/ML Soln injection ?Commonly known as: PROLIA ?Inject 60 mg into the skin every 6 (six) months. Administer in upper arm, thigh, or abdomen ?  ?diphenoxylate-atropine 2.5-0.025 MG tablet ?Commonly known as: LOMOTIL ?TAKE 1 TABLET BY MOUTH FOUR TIMES A DAY AS NEEDED FOR DIARRHEA OR LOOSE STOOLS ?  ?furosemide 20 MG tablet ?Commonly known as: LASIX ?TAKE 0.5-1 TABLETS (10-20 MG TOTAL) BY MOUTH DAILY AS NEEDED FOR EDEMA (FOR LEG SWELLING). ?  ?Jardiance 10 MG Tabs tablet ?Generic drug: empagliflozin ?TAKE 1 TABLET (10 MG TOTAL) BY MOUTH DAILY WITH BREAKFAST. ?  ?Klor-Con M20 20 MEQ tablet ?Generic drug: potassium chloride SA ?TAKE 1 TABLET BY MOUTH EVERY DAY ?  ?Nascobal 500 MCG/0.1ML Soln ?Generic drug: Cyanocobalamin ?USE 1 SPRAY IN 1 NOSTRIL  ONCE A WEEK ?  ?OneTouch Delica Lancets 75I Misc ?USE AS DIRECTED TO CHECK BLOOD SUGAR IN THE MORNING, AT NOON, AND AT BEDTIME ?   ?OneTouch Verio Flex System w/Device Kit ?1 each by Does not apply route in the morning, at noon, and at bedtime. ?  ?OneTouch Verio test strip ?Generic drug: glucose blood ?USE ONETOUCH VERIO TEST STRIPS AS INSTRUCTED TO CHECK BLOOD SUGAR THREE TIMES DAILY. ?  ?oxybutynin 5 MG tablet ?Commonly known as: DITROPAN ?Take 1 tablet (5 mg total) by mouth 3 (three) times daily. ?  ?propranolol ER 60 MG 24 hr capsule ?Commonly known as: INDERAL LA ?Take 1 capsule (60 mg total) by mouth 2 (two) times daily. ?What changed: when to take this ?  ?RABEprazole 20 MG tablet ?Commonly known as: ACIPHEX ?Take 1 tablet (20 mg total) by mouth daily. ?  ?saccharomyces boulardii 250 MG capsule ?Commonly known as: Florastor ?Take 1 capsule (250 mg total) by mouth 2 (two) times daily. ?  ?timolol 0.5 % ophthalmic solution ?Commonly known as: TIMOPTIC ?Place 1 drop into both eyes 2 (two) times daily. ?  ?Vitamin D3 1.25 MG (50000 UT) Caps ?TAKE ONE CAPSULE BY MOUTH EVERY 30 DAYS ?  ? ?  ? ? ?Allergies:  ?Allergies  ?Allergen Reactions  ? Actonel [Risedronate Sodium]   ?  Upset stomach  ? Boniva [Ibandronate Sodium]   ?  cramp  ? Calcium Channel Blockers   ?  Upset stomach  ? Chlorthalidone   ?  Diarrhea per pt  ? Compazine   ?  Daughter reacts to compazine/pt does not want to take  ? Lyrica [Pregabalin]   ?  Dizzy ?  ? Tape   ?  redness  ? ? ?Past Medical History:  ?Diagnosis Date  ? Adenomatous colon polyp 04/08/2011  ? Anemia   ? Anxiety   ? Cataract   ? Diabetes mellitus without complication ([place])   ? "pre-diabetic" per pt  ? Essential hypertension 08/10/2007  ? Chronic  On Catapress (per Dr Ubaldo Glassing) - increase to bid - d/c Hydralazine prn per Dr Caryl Comes d/c 3/19: Increase Inderal LA to bid  ? GERD (gastroesophageal reflux disease)   ? Glaucoma (increased eye pressure)   ? Heart murmur   ? HTN (hypertension)   ? IBS (irritable bowel syndrome)   ? LBP (low back pain)   ? Menopause   ? Osteoporosis   ? Ovarian cancer ([place]) 09/2008  ? Dr  Gwyneth Revels  ? Pancreatic cyst   ? Vitamin B12 deficiency   ? Vitamin D deficiency   ? ? ?Past Surgical History:  ?Procedure Laterality Date  ? ABDOMINAL HYSTERECTOMY    ? APPENDECTOMY    ? BLADDER  SURGERY  04/09/2020  ? tack and sling  ? CHOLECYSTECTOMY N/A 05/15/2017  ? Procedure: LAPAROSCOPIC CHOLECYSTECTOMY WITH INTRAOPERATIVE CHOLANGIOGRAM AND LYSIS OF ADHESIONS;  Surgeon: Fanny Skates, MD;  Location: [facility];  Service: General;  Laterality: N/A;  ? COLONOSCOPY  2018  ? Dr.Perry  ? ELBOW FRACTURE SURGERY    ? age 48- left elbow  ? [place]  2001  ? Ovarian Cancer [place]bul[place]  09/2008  ? POLYPECTOMY    ? ? ?Family History  ?Problem Relation Age of Onset  ? Alzheimer's disease Mother 61  ? Other Mother 24  ?     TAH for excessive bleeding  ? Lung cancer Father   ?     smoker; metastasis to stomach and other areas  ? Heart attack Maternal Uncle   ? Heart disease Maternal Uncle   ? Other Paternal Aunt   ?     stomach issues  ? Heart Problems Paternal Uncle   ? Other Maternal Grandmother   ?     stomach issues; +hysterectomy  ? Heart attack Maternal Grandfather   ? Heart disease Maternal Grandfather   ? Infertility Daughter   ? Stomach cancer Cousin   ?     dx. mid-60s  ? Other Cousin   ?     stomach issues  ? Leukemia Cousin 18  ? Stomach cancer Other   ? Cancer Cousin   ?     unknown type  ? Other Cousin   ?     stomach issues  ? Heart Problems Maternal Uncle   ? Diabetes Paternal Aunt   ? Heart Problems Paternal Uncle   ? Emphysema Paternal Uncle   ?     work exposure  ? Breast cancer Cousin   ?     dx. late 60s-early 70s  ? Colon cancer Neg Hx   ? Rectal cancer Neg Hx   ? Esophageal cancer Neg Hx   ? ? ?Social History:  reports that she has never smoked. She has never used smokeless tobacco. She reports that she does not drink alcohol and does not use drugs. ? ?  ?Review of Systems  ? ?  ?Lipid history: She has not been on any statin drugs, lipids monitored by her PCP ? ?  ?Lab Results  ?Component Value Date   ? CHOL 163 11/06/2020  ? HDL 49.70 11/06/2020  ? [place] 91 11/06/2020  ? TRIG 113.0 11/06/2020  ? CHOLHDL 3 11/06/2020  ?     ?  ?RETINOPATHY: She is followed by ophthalmologist for background retinopathy ?

## 2021-11-19 ENCOUNTER — Other Ambulatory Visit: Payer: Self-pay | Admitting: Internal Medicine

## 2021-11-29 ENCOUNTER — Other Ambulatory Visit: Payer: Self-pay

## 2021-11-29 ENCOUNTER — Telehealth: Payer: Self-pay

## 2021-11-29 MED ORDER — AMLODIPINE BESYLATE 5 MG PO TABS: 5.0000 mg | ORAL_TABLET | Freq: Every day | ORAL | 3 refills | Status: DC

## 2021-11-29 NOTE — Telephone Encounter (Signed)
Pt is requesting refill: ?amLODipine (NORVASC) 5 MG tablet ? ?Pharmacy: ?[id number]- [place], [place] - [address]? ?LOV  10/20/21 ?ROV 01/24/22 ?

## 2021-12-06 ENCOUNTER — Other Ambulatory Visit: Payer: Self-pay | Admitting: Internal Medicine

## 2021-12-08 DIAGNOSIS — H52203 Unspecified astigmatism, bilateral: Secondary | ICD-10-CM | POA: Diagnosis not present

## 2021-12-08 DIAGNOSIS — T1511XA Foreign body in conjunctival sac, right eye, initial encounter: Secondary | ICD-10-CM | POA: Diagnosis not present

## 2021-12-08 DIAGNOSIS — H2513 Age-related nuclear cataract, bilateral: Secondary | ICD-10-CM | POA: Diagnosis not present

## 2021-12-08 DIAGNOSIS — H40023 Open angle with borderline findings, high risk, bilateral: Secondary | ICD-10-CM | POA: Diagnosis not present

## 2021-12-11 NOTE — Telephone Encounter (Signed)
Last Prolia inj 09/22/21 ?Next Prolia inj due 03/23/22 ?

## 2021-12-29 ENCOUNTER — Other Ambulatory Visit: Payer: Self-pay | Admitting: Endocrinology

## 2021-12-31 ENCOUNTER — Ambulatory Visit (INDEPENDENT_AMBULATORY_CARE_PROVIDER_SITE_OTHER): Payer: Medicare Other

## 2021-12-31 DIAGNOSIS — Z0000 Encounter for general adult medical examination without abnormal findings: Secondary | ICD-10-CM

## 2021-12-31 NOTE — Patient Instructions (Signed)
[name] , ?Thank you for taking time to come for your Medicare Wellness Visit. I appreciate your ongoing commitment to your health goals. Please review the following plan we discussed and let me know if I can assist you in the future.  ? ?Screening recommendations/referrals: ?Colonoscopy: no longer required  ?Mammogram: no longer required  ?Bone Density: 06/03/2014 ?Recommended yearly ophthalmology/optometry visit for glaucoma screening and checkup ?Recommended yearly dental visit for hygiene and checkup ? ?Vaccinations: ?Influenza vaccine: declined  ?Pneumococcal vaccine: completed  ?Tdap vaccine: 02/15/2013 ?Shingles vaccine: will consider    ? ?Advanced directives: yes  ? ?Conditions/risks identified: none  ? ?Next appointment: none  ? ? ?Preventive Care 76 Years and Older, Female ?Preventive care refers to lifestyle choices and visits with your health care provider that can promote health and wellness. ?What does preventive care include? ?A yearly physical exam. This is also called an annual well check. ?Dental exams once or twice a year. ?Routine eye exams. Ask your health care provider how often you should have your eyes checked. ?Personal lifestyle choices, including: ?Daily care of your teeth and gums. ?Regular physical activity. ?Eating a healthy diet. ?Avoiding tobacco and drug use. ?Limiting alcohol use. ?Practicing safe sex. ?Taking low-dose aspirin every day. ?Taking vitamin and mineral supplements as recommended by your health care provider. ?What happens during an annual well check? ?The services and screenings done by your health care provider during your annual well check will depend on your age, overall health, lifestyle risk factors, and family history of disease. ?Counseling  ?Your health care provider may ask you questions about your: ?Alcohol use. ?Tobacco use. ?Drug use. ?Emotional well-being. ?Home and relationship well-being. ?Sexual activity. ?Eating habits. ?History of falls. ?Memory  and ability to understand (cognition). ?Work and work Statistician. ?Reproductive health. ?Screening  ?You may have the following tests or measurements: ?Height, weight, and BMI. ?Blood pressure. ?Lipid and cholesterol levels. These may be checked every 5 years, or more frequently if you are over 4 years old. ?Skin check. ?Lung cancer screening. You may have this screening every year starting at age 46 if you have a 30-pack-year history of smoking and currently smoke or have quit within the past 15 years. ?Fecal occult blood test (FOBT) of the stool. You may have this test every year starting at age 40. ?Flexible sigmoidoscopy or colonoscopy. You may have a sigmoidoscopy every 5 years or a colonoscopy every 10 years starting at age 34. ?Hepatitis C blood test. ?Hepatitis B blood test. ?Sexually transmitted disease (STD) testing. ?Diabetes screening. This is done by checking your blood sugar (glucose) after you have not eaten for a while (fasting). You may have this done every 1-3 years. ?Bone density scan. This is done to screen for osteoporosis. You may have this done starting at age 36. ?Mammogram. This may be done every 1-2 years. Talk to your health care provider about how often you should have regular mammograms. ?Talk with your health care provider about your test results, treatment options, and if necessary, the need for more tests. ?Vaccines  ?Your health care provider may recommend certain vaccines, such as: ?Influenza vaccine. This is recommended every year. ?Tetanus, diphtheria, and acellular pertussis (Tdap, Td) vaccine. You may need a Td booster every 10 years. ?Zoster vaccine. You may need this after age 32. ?Pneumococcal 13-valent conjugate (PCV13) vaccine. One dose is recommended after age 20. ?Pneumococcal polysaccharide (PPSV23) vaccine. One dose is recommended after age 54. ?Talk to your health care provider about which screenings and  vaccines you need and how often you need them. ?This  information is not intended to replace advice given to you by your health care provider. Make sure you discuss any questions you have with your health care provider. ?Document Released: 09/18/2015 Document Revised: 05/11/2016 Document Reviewed: 06/23/2015 ?Elsevier Interactive Patient Education ? 2017 [place]. ? ?Fall Prevention in the Home ?Falls can cause injuries. They can happen to people of all ages. There are many things you can do to make your home safe and to help prevent falls. ?What can I do on the outside of my home? ?Regularly fix the edges of walkways and driveways and fix any cracks. ?Remove anything that might make you trip as you walk through a door, such as a raised step or threshold. ?Trim any bushes or trees on the path to your home. ?Use bright outdoor lighting. ?Clear any walking paths of anything that might make someone trip, such as rocks or tools. ?Regularly check to see if handrails are loose or broken. Make sure that both sides of any steps have handrails. ?Any raised decks and porches should have guardrails on the edges. ?Have any leaves, snow, or ice cleared regularly. ?Use sand or salt on walking paths during winter. ?Clean up any spills in your garage right away. This includes oil or grease spills. ?What can I do in the bathroom? ?Use night lights. ?Install grab bars by the toilet and in the tub and shower. Do not use towel bars as grab bars. ?Use non-skid mats or decals in the tub or shower. ?If you need to sit down in the shower, use a plastic, non-slip stool. ?Keep the floor dry. Clean up any water that spills on the floor as soon as it happens. ?Remove soap buildup in the tub or shower regularly. ?Attach bath mats securely with double-sided non-slip rug tape. ?Do not have throw rugs and other things on the floor that can make you trip. ?What can I do in the bedroom? ?Use night lights. ?Make sure that you have a light by your bed that is easy to reach. ?Do not use any sheets or  blankets that are too big for your bed. They should not hang down onto the floor. ?Have a firm chair that has side arms. You can use this for support while you get dressed. ?Do not have throw rugs and other things on the floor that can make you trip. ?What can I do in the kitchen? ?Clean up any spills right away. ?Avoid walking on wet floors. ?Keep items that you use a lot in easy-to-reach places. ?If you need to reach something above you, use a strong step stool that has a grab bar. ?Keep electrical cords out of the way. ?Do not use floor polish or wax that makes floors slippery. If you must use wax, use non-skid floor wax. ?Do not have throw rugs and other things on the floor that can make you trip. ?What can I do with my stairs? ?Do not leave any items on the stairs. ?Make sure that there are handrails on both sides of the stairs and use them. Fix handrails that are broken or loose. Make sure that handrails are as long as the stairways. ?Check any carpeting to make sure that it is firmly attached to the stairs. Fix any carpet that is loose or worn. ?Avoid having throw rugs at the top or bottom of the stairs. If you do have throw rugs, attach them to the floor with carpet tape. ?Make  sure that you have a light switch at the top of the stairs and the bottom of the stairs. If you do not have them, ask someone to add them for you. ?What else can I do to help prevent falls? ?Wear shoes that: ?Do not have high heels. ?Have rubber bottoms. ?Are comfortable and fit you well. ?Are closed at the toe. Do not wear sandals. ?If you use a stepladder: ?Make sure that it is fully opened. Do not climb a closed stepladder. ?Make sure that both sides of the stepladder are locked into place. ?Ask someone to hold it for you, if possible. ?Clearly mark and make sure that you can see: ?Any grab bars or handrails. ?First and last steps. ?Where the edge of each step is. ?Use tools that help you move around (mobility aids) if they are  needed. These include: ?Canes. ?Walkers. ?Scooters. ?Crutches. ?Turn on the lights when you go into a dark area. Replace any light bulbs as soon as they burn out. ?Set up your furniture so you have a clear path. Av

## 2021-12-31 NOTE — Progress Notes (Addendum)
? ?Subjective:  ? [name] is a 76 y.o. female who presents for Medicare Annual (Subsequent) preventive examination. ? ? ?I connected with [name] today by telephone and verified that I am speaking with the correct person using two identifiers. ?Location patient: home ?Location provider: work ?Persons participating in the virtual visit: patient, provider. ?  ?I discussed the limitations, risks, security and privacy concerns of performing an evaluation and management service by telephone and the availability of in person appointments. I also discussed with the patient that there may be a patient responsible charge related to this service. The patient expressed understanding and verbally consented to this telephonic visit.  ?  ?Interactive audio and video telecommunications were attempted between this provider and patient, however failed, due to patient having technical difficulties OR patient did not have access to video capability.  We continued and completed visit with audio only. ? ?  ?Review of Systems    ? ?Cardiac Risk Factors include: advanced age (>50mn, >>21women) ? ?   ?Objective:  ?  ?Today's Vitals  ? ?There is no height or weight on file to calculate BMI. ? ? ?  12/31/2021  ?  9:34 AM 01/05/2021  ?  5:16 PM 12/23/2020  ?  4:11 PM 10/16/2018  ?  1:32 PM 04/27/2018  ? 10:49 AM 04/02/2018  ?  5:33 AM 04/01/2018  ? 11:08 PM  ?Advanced Directives  ?Does Patient Have a Medical Advance Directive? _0  Yes No  ?Type of AParamed[place]of [place] Living will;Healthcare Power of [place] will  Healthcare Power of Attorney   ?Does patient want to make changes to medical advance directive?   No - Patient declined No - Patient declined     ?Copy of HB[place]in Chart? No - copy requested No - copy requested No - copy requested No - copy requested  No - copy requested    ?Would patient like information on creating a medical advance directive?       No - Patient declined  ? ? ?Current Medications (verified) ?Outpatient Encounter Medications as of 12/31/2021  ?Medication Sig  ? amLODipine (NORVASC) 5 MG tablet Take 1 tablet (5 mg total) by mouth daily.  ? aspirin 81 MG EC tablet Take 81 mg by mouth daily.  ? bimatoprost (LUMIGAN) 0.01 % SOLN Place 1 drop into both eyes at bedtime.  ? Blood Glucose Monitoring Suppl (ONETOUCH VERIO FLEX SYSTEM) w/Device KIT 1 each by Does not apply route in the morning, at noon, and at bedtime.   ? Cholecalciferol (VITAMIN D3) 1.25 MG (50000 UT) CAPS TAKE ONE CAPSULE BY MOUTH EVERY 30 DAYS  ? [employer] DIGESTIVE PROBIOTIC 250 MG capsule TAKE 1 CAPSULE BY MOUTH 2 TIMES DAILY.  ? Cyanocobalamin (NASCOBAL) 500 MCG/0.1ML SOLN USE 1 SPRAY IN 1 NOSTRIL  ONCE A WEEK  ? denosumab (PROLIA) 60 MG/ML SOLN injection Inject 60 mg into the skin every 6 (six) months. Administer in upper arm, thigh, or abdomen  ? diphenoxylate-atropine (LOMOTIL) 2.5-0.025 MG tablet TAKE 1 TABLET BY MOUTH FOUR TIMES A DAY AS NEEDED FOR DIARRHEA OR LOOSE STOOLS  ? furosemide (LASIX) 20 MG tablet TAKE 0.5-1 TABLETS (10-20 MG TOTAL) BY MOUTH DAILY AS NEEDED FOR EDEMA (FOR LEG SWELLING).  ? JARDIANCE 10 MG TABS tablet TAKE 1 TABLET (10 MG TOTAL) BY MOUTH DAILY WITH BREAKFAST.  ? KLOR-CON M20 20 MEQ tablet  TAKE 1 TABLET BY MOUTH EVERY DAY  ? OneTouch Delica Lancets 66Z MISC USE AS DIRECTED TO CHECK BLOOD SUGAR IN THE MORNING, AT NOON, AND AT BEDTIME  ? ONETOUCH VERIO test strip USE ONETOUCH VERIO TEST STRIPS AS INSTRUCTED TO CHECK BLOOD SUGAR THREE TIMES DAILY.  ? propranolol ER (INDERAL LA) 60 MG 24 hr capsule TAKE 1 CAPSULE (60 MG TOTAL) BY MOUTH 2 (TWO) TIMES DAILY.  ? RABEprazole (ACIPHEX) 20 MG tablet Take 1 tablet (20 mg total) by mouth daily.  ? timolol (TIMOPTIC) 0.5 % ophthalmic solution Place 1 drop into both eyes 2 (two) times daily.  ? oxybutynin (DITROPAN) 5 MG tablet Take 1 tablet (5  mg total) by mouth 3 (three) times daily. (Patient not taking: Reported on 11/18/2021)  ? ?No facility-administered encounter medications on file as of 12/31/2021.  ? ? ?Allergies (verified) ?Actonel [risedronate sodium], Boniva [ibandronate sodium], Calcium channel blockers, Chlorthalidone, Compazine, Lyrica [pregabalin], and Tape  ? ?History: ?Past Medical History:  ?Diagnosis Date  ? Adenomatous colon polyp 04/08/2011  ? Anemia   ? Anxiety   ? Cataract   ? Diabetes mellitus without complication ([place])   ? "pre-diabetic" per pt  ? Essential hypertension 08/10/2007  ? Chronic  On Catapress (per Dr Ubaldo Glassing) - increase to bid - d/c Hydralazine prn per Dr Caryl Comes d/c 3/19: Increase Inderal LA to bid  ? GERD (gastroesophageal reflux disease)   ? Glaucoma (increased eye pressure)   ? Heart murmur   ? HTN (hypertension)   ? IBS (irritable bowel syndrome)   ? LBP (low back pain)   ? Menopause   ? Osteoporosis   ? Ovarian cancer ([place]) 09/2008  ? Dr Gwyneth Revels  ? Pancreatic cyst   ? Vitamin B12 deficiency   ? Vitamin D deficiency   ? ?Past Surgical History:  ?Procedure Laterality Date  ? ABDOMINAL HYSTERECTOMY    ? APPENDECTOMY    ? BLADDER SURGERY  04/09/2020  ? tack and sling  ? CHOLECYSTECTOMY N/A 05/15/2017  ? Procedure: LAPAROSCOPIC CHOLECYSTECTOMY WITH INTRAOPERATIVE CHOLANGIOGRAM AND LYSIS OF ADHESIONS;  Surgeon: Fanny Skates, MD;  Location: [facility];  Service: General;  Laterality: N/A;  ? COLONOSCOPY  2018  ? Dr.Perry  ? ELBOW FRACTURE SURGERY    ? age 48- left elbow  ? [place]  2001  ? Ovarian Cancer [place]  09/2008  ? POLYPECTOMY    ? ?Family History  ?Problem Relation Age of Onset  ? Alzheimer's disease Mother 53  ? Other Mother 33  ?     TAH for excessive bleeding  ? Lung cancer Father   ?     smoker; metastasis to stomach and other areas  ? Heart attack Maternal Uncle   ? Heart disease Maternal Uncle   ? Other Paternal Aunt   ?     stomach issues  ? Heart Problems Paternal Uncle   ? Other Maternal Grandmother    ?     stomach issues; +hysterectomy  ? Heart attack Maternal Grandfather   ? Heart disease Maternal Grandfather   ? Infertility Daughter   ? Stomach cancer Cousin   ?     dx. mid-60s  ? Other Cousin   ?     stomach issues  ? Leukemia Cousin 18  ? Stomach cancer Other   ? Cancer Cousin   ?     unknown type  ? Other Cousin   ?     stomach issues  ? Heart Problems Maternal Uncle   ?  Diabetes Paternal Aunt   ? Heart Problems Paternal Uncle   ? Emphysema Paternal Uncle   ?     work exposure  ? Breast cancer Cousin   ?     dx. late 60s-early 70s  ? Colon cancer Neg Hx   ? Rectal cancer Neg Hx   ? Esophageal cancer Neg Hx   ? ?Social History  ? ?Socioeconomic History  ? Marital status: Married  ?  Spouse name: Dominica Severin  ? Number of children: 2  ? Years of education: BS  ? Highest education level: Not on file  ?Occupational History  ? Occupation: Homemaker  ?  Employer: [employer]  ?Tobacco Use  ? Smoking status: Never  ? Smokeless tobacco: Never  ?Vaping Use  ? Vaping Use: Never used  ?Substance and Sexual Activity  ? Alcohol use: No  ?  Alcohol/week: 0.0 standard drinks  ? Drug use: No  ? Sexual activity: Not on file  ?Other Topics Concern  ? Not on file  ?Social History Narrative  ? Lives with spouse  ? Caffeine use: cokes  ?   ? Right handed   ? ?Social Determinants of Health  ? ?Financial Resource Strain: Low Risk   ? Difficulty of Paying Living Expenses: Not hard at all  ?Food Insecurity: No Food Insecurity  ? Worried About Charity fundraiser in the Last Year: Never true  ? Ran Out of Food in the Last Year: Never true  ?Transportation Needs: No Transportation Needs  ? Lack of Transportation (Medical): No  ? Lack of Transportation (Non-Medical): No  ?Physical Activity: Inactive  ? Days of Exercise per Week: 0 days  ? Minutes of Exercise per Session: 0 min  ?Stress: No Stress Concern Present  ? Feeling of Stress : Not at all  ?Social Connections: Moderately Integrated  ? Frequency of Communication with Friends and Family:  Twice a week  ? Frequency of Social Gatherings with Friends and Family: Twice a week  ? Attends Religious Services: More than 4 times per year  ? Active Member of Clubs or Organizations: No  ? Atten

## 2022-01-04 DIAGNOSIS — R350 Frequency of micturition: Secondary | ICD-10-CM | POA: Insufficient documentation

## 2022-01-04 DIAGNOSIS — Z87448 Personal history of other diseases of urinary system: Secondary | ICD-10-CM | POA: Insufficient documentation

## 2022-01-04 DIAGNOSIS — R351 Nocturia: Secondary | ICD-10-CM | POA: Insufficient documentation

## 2022-01-12 ENCOUNTER — Other Ambulatory Visit: Payer: Self-pay | Admitting: Internal Medicine

## 2022-01-12 DIAGNOSIS — R800 Isolated proteinuria: Secondary | ICD-10-CM | POA: Diagnosis not present

## 2022-01-12 DIAGNOSIS — Z960 Presence of urogenital implants: Secondary | ICD-10-CM | POA: Diagnosis not present

## 2022-01-12 DIAGNOSIS — Z87448 Personal history of other diseases of urinary system: Secondary | ICD-10-CM | POA: Diagnosis not present

## 2022-01-12 DIAGNOSIS — R350 Frequency of micturition: Secondary | ICD-10-CM | POA: Diagnosis not present

## 2022-01-12 DIAGNOSIS — Z1231 Encounter for screening mammogram for malignant neoplasm of breast: Secondary | ICD-10-CM

## 2022-01-12 DIAGNOSIS — Z87442 Personal history of urinary calculi: Secondary | ICD-10-CM | POA: Diagnosis not present

## 2022-01-12 DIAGNOSIS — N3281 Overactive bladder: Secondary | ICD-10-CM | POA: Diagnosis not present

## 2022-01-12 DIAGNOSIS — R351 Nocturia: Secondary | ICD-10-CM | POA: Diagnosis not present

## 2022-01-12 DIAGNOSIS — N3941 Urge incontinence: Secondary | ICD-10-CM | POA: Diagnosis not present

## 2022-01-17 ENCOUNTER — Other Ambulatory Visit: Payer: Self-pay | Admitting: Internal Medicine

## 2022-01-19 ENCOUNTER — Ambulatory Visit: Payer: Medicare Other | Admitting: Internal Medicine

## 2022-01-24 ENCOUNTER — Ambulatory Visit (INDEPENDENT_AMBULATORY_CARE_PROVIDER_SITE_OTHER): Payer: Medicare Other | Admitting: Internal Medicine

## 2022-01-24 ENCOUNTER — Encounter: Payer: Self-pay | Admitting: Internal Medicine

## 2022-01-24 VITALS — BP 122/70 | HR 54 | Temp 97.9°F | Ht 64.0 in | Wt 165.0 lb

## 2022-01-24 DIAGNOSIS — R1031 Right lower quadrant pain: Secondary | ICD-10-CM | POA: Diagnosis not present

## 2022-01-24 DIAGNOSIS — M7070 Other bursitis of hip, unspecified hip: Secondary | ICD-10-CM | POA: Insufficient documentation

## 2022-01-24 DIAGNOSIS — M7071 Other bursitis of hip, right hip: Secondary | ICD-10-CM | POA: Diagnosis not present

## 2022-01-24 LAB — CBC WITH DIFFERENTIAL/PLATELET
Basophils Absolute: 0 10*3/uL (ref 0.0–0.1)
Basophils Relative: 0.6 % (ref 0.0–3.0)
Eosinophils Absolute: 0.1 10*3/uL (ref 0.0–0.7)
Eosinophils Relative: 1.6 % (ref 0.0–5.0)
HCT: 36.9 % (ref 36.0–46.0)
Hemoglobin: 12.5 g/dL (ref 12.0–15.0)
Lymphocytes Relative: 18.5 % (ref 12.0–46.0)
Lymphs Abs: 1.2 10*3/uL (ref 0.7–4.0)
MCHC: 33.8 g/dL (ref 30.0–36.0)
MCV: 86.6 fl (ref 78.0–100.0)
Monocytes Absolute: 0.5 10*3/uL (ref 0.1–1.0)
Monocytes Relative: 8.1 % (ref 3.0–12.0)
Neutro Abs: 4.7 10*3/uL (ref 1.4–7.7)
Neutrophils Relative %: 71.2 % (ref 43.0–77.0)
Platelets: 209 10*3/uL (ref 150.0–400.0)
RBC: 4.26 Mil/uL (ref 3.87–5.11)
RDW: 14.2 % (ref 11.5–15.5)
WBC: 6.6 10*3/uL (ref 4.0–10.5)

## 2022-01-24 LAB — COMPREHENSIVE METABOLIC PANEL
ALT: 19 U/L (ref 0–35)
AST: 23 U/L (ref 0–37)
Albumin: 4.4 g/dL (ref 3.5–5.2)
Alkaline Phosphatase: 52 U/L (ref 39–117)
BUN: 27 mg/dL — ABNORMAL HIGH (ref 6–23)
CO2: 28 mEq/L (ref 19–32)
Calcium: 9.7 mg/dL (ref 8.4–10.5)
Chloride: 104 mEq/L (ref 96–112)
Creatinine, Ser: 1.21 mg/dL — ABNORMAL HIGH (ref 0.40–1.20)
GFR: 43.65 mL/min — ABNORMAL LOW (ref >60.00)
Glucose, Bld: 103 mg/dL — ABNORMAL HIGH (ref 70–99)
Potassium: 3.9 mEq/L (ref 3.5–5.1)
Sodium: 140 mEq/L (ref 135–145)
Total Bilirubin: 0.7 mg/dL (ref 0.2–1.2)
Total Protein: 7.9 g/dL (ref 6.0–8.3)

## 2022-01-24 LAB — URINALYSIS
Bilirubin Urine: NEGATIVE
Ketones, ur: NEGATIVE
Leukocytes,Ua: NEGATIVE
Nitrite: NEGATIVE
Specific Gravity, Urine: 1.015 (ref 1.000–1.030)
Total Protein, Urine: NEGATIVE
Urine Glucose: 500 — AB
Urobilinogen, UA: 0.2 (ref 0.0–1.0)
pH: 6 (ref 5.0–8.0)

## 2022-01-24 LAB — SEDIMENTATION RATE: Sed Rate: 34 mm/hr — ABNORMAL HIGH (ref 0–30)

## 2022-01-24 NOTE — Progress Notes (Signed)
Subjective:  Patient ID: [name], female    DOB: 05-18-1946  Age: 76 y.o. MRN: [id number]  CC: No chief complaint on file.   HPI [name] presents for LBP on the R since Nov 2022 - had X rays. Pain is worse when car is hitting bumps, crossing legs. At rest pain is 1-2/10.    Outpatient Medications Prior to Visit  Medication Sig Dispense Refill   amLODipine (NORVASC) 5 MG tablet Take 1 tablet (5 mg total) by mouth daily. 90 tablet 3   aspirin 81 MG EC tablet Take 81 mg by mouth daily.     bimatoprost (LUMIGAN) 0.01 % SOLN Place 1 drop into both eyes at bedtime.     Blood Glucose Monitoring Suppl (ONETOUCH VERIO FLEX SYSTEM) w/Device KIT 1 each by Does not apply route in the morning, at noon, and at bedtime.      Cholecalciferol (VITAMIN D3) 1.25 MG (50000 UT) CAPS TAKE ONE CAPSULE BY MOUTH EVERY 30 DAYS 3 capsule 3   CVS DIGESTIVE PROBIOTIC 250 MG capsule TAKE 1 CAPSULE BY MOUTH 2 TIMES DAILY. 50 capsule 2   Cyanocobalamin (NASCOBAL) 500 MCG/0.1ML SOLN USE 1 SPRAY IN 1 NOSTRIL  ONCE A WEEK 3 each 3   denosumab (PROLIA) 60 MG/ML SOLN injection Inject 60 mg into the skin every 6 (six) months. Administer in upper arm, thigh, or abdomen     DENTA 5000 PLUS 1.1 % CREA dental cream Take by mouth as directed.     diphenoxylate-atropine (LOMOTIL) 2.5-0.025 MG tablet TAKE 1 TABLET BY MOUTH FOUR TIMES A DAY AS NEEDED FOR DIARRHEA OR LOOSE STOOLS 60 tablet 3   furosemide (LASIX) 20 MG tablet TAKE 0.5-1 TABLETS (10-20 MG TOTAL) BY MOUTH DAILY AS NEEDED FOR EDEMA (FOR LEG SWELLING). 90 tablet 1   JARDIANCE 10 MG TABS tablet TAKE 1 TABLET (10 MG TOTAL) BY MOUTH DAILY WITH BREAKFAST. 90 tablet 2   KLOR-CON M20 20 MEQ tablet TAKE 1 TABLET BY MOUTH EVERY DAY 90 tablet 3   OneTouch Delica Lancets 56Y MISC USE AS DIRECTED TO CHECK BLOOD SUGAR IN THE MORNING, AT NOON, AND AT BEDTIME 300 each 2   ONETOUCH VERIO test strip USE ONETOUCH VERIO TEST STRIPS AS INSTRUCTED TO CHECK BLOOD SUGAR THREE  TIMES DAILY. 300 strip 1   oxybutynin (DITROPAN) 5 MG tablet TAKE 1 TABLET BY MOUTH THREE TIMES A DAY 270 tablet 1   propranolol ER (INDERAL LA) 60 MG 24 hr capsule TAKE 1 CAPSULE (60 MG TOTAL) BY MOUTH 2 (TWO) TIMES DAILY. 180 capsule 3   RABEprazole (ACIPHEX) 20 MG tablet Take 1 tablet (20 mg total) by mouth daily. 90 tablet 3   timolol (TIMOPTIC) 0.5 % ophthalmic solution Place 1 drop into both eyes 2 (two) times daily.     oxybutynin (DITROPAN) 5 MG tablet Take by mouth.     No facility-administered medications prior to visit.    ROS: Review of Systems  Constitutional:  Negative for activity change, appetite change, chills, fatigue and unexpected weight change.  HENT:  Negative for congestion, mouth sores and sinus pressure.   Eyes:  Negative for visual disturbance.  Respiratory:  Negative for cough and chest tightness.   Gastrointestinal:  Positive for abdominal pain and diarrhea. Negative for abdominal distention, nausea and vomiting.  Genitourinary:  Negative for difficulty urinating, frequency and vaginal pain.  Musculoskeletal:  Positive for back pain. Negative for gait problem.  Skin:  Negative for pallor and rash.  Neurological:  Negative for dizziness, tremors, weakness, numbness and headaches.  Psychiatric/Behavioral:  Negative for confusion and sleep disturbance. The patient is nervous/anxious.    Objective:  BP 122/70 (BP Location: Left Arm, Patient Position: Sitting, Cuff Size: Normal)   Pulse (!) 54   Temp 97.9 F (36.6 C) (Oral)   Ht '5\' 4"'  (1.626 m)   Wt 165 lb (74.8 kg)   SpO2 98%   BMI 28.32 kg/m   BP Readings from Last 3 Encounters:  01/24/22 122/70  11/18/21 (!) 144/76  10/20/21 140/86    Wt Readings from Last 3 Encounters:  01/24/22 165 lb (74.8 kg)  11/18/21 163 lb 9.6 oz (74.2 kg)  10/20/21 162 lb (73.5 kg)    Physical Exam Constitutional:      General: She is not in acute distress.    Appearance: She is well-developed. She is obese.  HENT:      Head: Normocephalic.     Right Ear: External ear normal.     Left Ear: External ear normal.     Nose: Nose normal.  Eyes:     General:        Right eye: No discharge.        Left eye: No discharge.     Conjunctiva/sclera: Conjunctivae normal.     Pupils: Pupils are equal, round, and reactive to light.  Neck:     Thyroid: No thyromegaly.     Vascular: No JVD.     Trachea: No tracheal deviation.  Cardiovascular:     Rate and Rhythm: Normal rate and regular rhythm.     Heart sounds: Normal heart sounds.  Pulmonary:     Effort: No respiratory distress.     Breath sounds: No stridor. No wheezing.  Abdominal:     General: Bowel sounds are normal. There is no distension.     Palpations: Abdomen is soft. There is no mass.     Tenderness: There is no abdominal tenderness. There is no right CVA tenderness, left CVA tenderness, guarding or rebound.     Hernia: No hernia is present.  Musculoskeletal:        General: No tenderness.     Cervical back: Normal range of motion and neck supple. No rigidity.  Lymphadenopathy:     Cervical: No cervical adenopathy.  Skin:    Findings: No erythema or rash.  Neurological:     Cranial Nerves: No cranial nerve deficit.     Motor: No abnormal muscle tone.     Coordination: Coordination normal.     Deep Tendon Reflexes: Reflexes normal.  Psychiatric:        Behavior: Behavior normal.        Thought Content: Thought content normal.        Judgment: Judgment normal.    Lab Results  Component Value Date   WBC 7.1 09/22/2021   HGB 12.2 09/22/2021   HCT 36.3 09/22/2021   PLT 239.0 09/22/2021   GLUCOSE 103 (H) 11/16/2021   CHOL 163 11/06/2020   TRIG 113.0 11/06/2020   HDL 49.70 11/06/2020   LDLCALC 91 11/06/2020   ALT 14 09/22/2021   AST 20 09/22/2021   NA 140 11/16/2021   K 4.0 11/16/2021   CL 107 11/16/2021   CREATININE 1.06 11/16/2021   BUN 24 (H) 11/16/2021   CO2 23 11/16/2021   TSH 1.50 07/23/2018   HGBA1C 5.9 11/16/2021    MICROALBUR 10.3 (H) 11/16/2021    MM 3D SCREEN BREAST BILATERAL  Result  Date: 02/20/2021 CLINICAL DATA:  Screening. EXAM: DIGITAL SCREENING BILATERAL MAMMOGRAM WITH TOMOSYNTHESIS AND CAD TECHNIQUE: Bilateral screening digital craniocaudal and mediolateral oblique mammograms were obtained. Bilateral screening digital breast tomosynthesis was performed. The images were evaluated with computer-aided detection. COMPARISON:  Previous exam(s). ACR Breast Density Category c: The breast tissue is heterogeneously dense, which may obscure small masses. FINDINGS: There are no findings suspicious for malignancy. IMPRESSION: No mammographic evidence of malignancy. A result letter of this screening mammogram will be mailed directly to the patient. RECOMMENDATION: Screening mammogram in one year. (Code:SM-B-01Y) BI-RADS CATEGORY  1: Negative. Electronically Signed   By: Everlean Alstrom M.D.   On: 02/20/2021 10:45    Assessment & Plan:   Problem List Items Addressed This Visit     Abdominal pain - Primary   Relevant Orders   CT Abdomen Pelvis W Contrast   Iliopsoas bursitis    5/23 probable PT offered       RLQ abdominal pain    Chronic - worse Probable ileo-psoas bursitis Will get abd CT       Relevant Orders   Comprehensive metabolic panel   CBC with Differential/Platelet   Urinalysis   Sedimentation rate      No orders of the defined types were placed in this encounter.     Follow-up: Return in about 3 months (around 04/26/2022) for a follow-up visit.  Walker Kehr, MD

## 2022-01-24 NOTE — Assessment & Plan Note (Addendum)
5/23 probable PT offered

## 2022-01-24 NOTE — Assessment & Plan Note (Addendum)
Chronic - worse Probable ileo-psoas bursitis Will get abd CT

## 2022-01-25 NOTE — Addendum Note (Signed)
Addended by: Earnstine Regal on: 01/25/2022 08:27 AM   Modules accepted: Orders

## 2022-02-01 ENCOUNTER — Ambulatory Visit
Admission: RE | Admit: 2022-02-01 | Discharge: 2022-02-01 | Disposition: A | Discharge status: Home / Self Care | Payer: Medicare Other | Source: Ambulatory Visit | Attending: Internal Medicine | Admitting: Internal Medicine

## 2022-02-01 DIAGNOSIS — I700 Atherosclerosis of aorta: Secondary | ICD-10-CM | POA: Diagnosis not present

## 2022-02-01 DIAGNOSIS — Z9049 Acquired absence of other specified parts of digestive tract: Secondary | ICD-10-CM | POA: Diagnosis not present

## 2022-02-01 DIAGNOSIS — R1031 Right lower quadrant pain: Secondary | ICD-10-CM

## 2022-02-01 DIAGNOSIS — Z90710 Acquired absence of both cervix and uterus: Secondary | ICD-10-CM | POA: Diagnosis not present

## 2022-02-01 MED ORDER — IOPAMIDOL (ISOVUE-300) INJECTION 61%
75.0000 mL | Freq: Once | INTRAVENOUS | Status: AC | PRN
  Administered 2022-02-01: 75 mL via INTRAVENOUS

## 2022-02-03 ENCOUNTER — Emergency Department (HOSPITAL_BASED_OUTPATIENT_CLINIC_OR_DEPARTMENT_OTHER)
Admission: EM | Admit: 2022-02-03 | Discharge: 2022-02-03 | Disposition: A | Discharge status: Home / Self Care | Payer: Medicare Other | Attending: Emergency Medicine | Admitting: Emergency Medicine

## 2022-02-03 ENCOUNTER — Emergency Department (HOSPITAL_BASED_OUTPATIENT_CLINIC_OR_DEPARTMENT_OTHER): Payer: Medicare Other

## 2022-02-03 ENCOUNTER — Encounter: Payer: Self-pay | Admitting: Internal Medicine

## 2022-02-03 ENCOUNTER — Telehealth: Payer: Self-pay | Admitting: Internal Medicine

## 2022-02-03 ENCOUNTER — Encounter (HOSPITAL_BASED_OUTPATIENT_CLINIC_OR_DEPARTMENT_OTHER): Payer: Self-pay | Admitting: *Deleted

## 2022-02-03 ENCOUNTER — Other Ambulatory Visit: Payer: Self-pay

## 2022-02-03 DIAGNOSIS — M47816 Spondylosis without myelopathy or radiculopathy, lumbar region: Secondary | ICD-10-CM | POA: Diagnosis not present

## 2022-02-03 DIAGNOSIS — I700 Atherosclerosis of aorta: Secondary | ICD-10-CM | POA: Insufficient documentation

## 2022-02-03 DIAGNOSIS — I10 Essential (primary) hypertension: Secondary | ICD-10-CM | POA: Insufficient documentation

## 2022-02-03 DIAGNOSIS — Z7984 Long term (current) use of oral hypoglycemic drugs: Secondary | ICD-10-CM | POA: Insufficient documentation

## 2022-02-03 DIAGNOSIS — E119 Type 2 diabetes mellitus without complications: Secondary | ICD-10-CM | POA: Diagnosis not present

## 2022-02-03 DIAGNOSIS — Z7982 Long term (current) use of aspirin: Secondary | ICD-10-CM | POA: Insufficient documentation

## 2022-02-03 DIAGNOSIS — Z8543 Personal history of malignant neoplasm of ovary: Secondary | ICD-10-CM | POA: Diagnosis not present

## 2022-02-03 DIAGNOSIS — R1030 Lower abdominal pain, unspecified: Secondary | ICD-10-CM

## 2022-02-03 DIAGNOSIS — M5136 Other intervertebral disc degeneration, lumbar region: Secondary | ICD-10-CM | POA: Diagnosis not present

## 2022-02-03 DIAGNOSIS — R109 Unspecified abdominal pain: Secondary | ICD-10-CM | POA: Diagnosis not present

## 2022-02-03 DIAGNOSIS — R1032 Left lower quadrant pain: Secondary | ICD-10-CM | POA: Diagnosis not present

## 2022-02-03 DIAGNOSIS — Z79899 Other long term (current) drug therapy: Secondary | ICD-10-CM | POA: Insufficient documentation

## 2022-02-03 DIAGNOSIS — M545 Low back pain, unspecified: Secondary | ICD-10-CM

## 2022-02-03 LAB — URINALYSIS, ROUTINE W REFLEX MICROSCOPIC
Bilirubin Urine: NEGATIVE
Glucose, UA: 250 mg/dL — AB
Hgb urine dipstick: NEGATIVE
Ketones, ur: NEGATIVE mg/dL
Leukocytes,Ua: NEGATIVE
Nitrite: NEGATIVE
Protein, ur: NEGATIVE mg/dL
Specific Gravity, Urine: <1.005 — ABNORMAL LOW (ref 1.005–1.030)
pH: 5.5 (ref 5.0–8.0)

## 2022-02-03 LAB — CBC WITH DIFFERENTIAL/PLATELET
Abs Immature Granulocytes: 0.01 10*3/uL (ref 0.00–0.07)
Basophils Absolute: 0 10*3/uL (ref 0.0–0.1)
Basophils Relative: 0 %
Eosinophils Absolute: 0.1 10*3/uL (ref 0.0–0.5)
Eosinophils Relative: 1 %
HCT: 35.7 % — ABNORMAL LOW (ref 36.0–46.0)
Hemoglobin: 12 g/dL (ref 12.0–15.0)
Immature Granulocytes: 0 %
Lymphocytes Relative: 20 %
Lymphs Abs: 1.1 10*3/uL (ref 0.7–4.0)
MCH: 28.9 pg (ref 26.0–34.0)
MCHC: 33.6 g/dL (ref 30.0–36.0)
MCV: 86 fL (ref 80.0–100.0)
Monocytes Absolute: 0.4 10*3/uL (ref 0.1–1.0)
Monocytes Relative: 7 %
Neutro Abs: 4 10*3/uL (ref 1.7–7.7)
Neutrophils Relative %: 72 %
Platelets: 185 10*3/uL (ref 150–400)
RBC: 4.15 MIL/uL (ref 3.87–5.11)
RDW: 13.6 % (ref 11.5–15.5)
WBC: 5.6 10*3/uL (ref 4.0–10.5)
nRBC: 0 % (ref 0.0–0.2)

## 2022-02-03 LAB — COMPREHENSIVE METABOLIC PANEL
ALT: 17 U/L (ref 0–44)
AST: 22 U/L (ref 15–41)
Albumin: 4.5 g/dL (ref 3.5–5.0)
Alkaline Phosphatase: 45 U/L (ref 38–126)
Anion gap: 11 (ref 5–15)
BUN: 15 mg/dL (ref 8–23)
CO2: 23 mmol/L (ref 22–32)
Calcium: 9 mg/dL (ref 8.9–10.3)
Chloride: 108 mmol/L (ref 98–111)
Creatinine, Ser: 0.97 mg/dL (ref 0.44–1.00)
GFR, Estimated: >60 mL/min (ref >60)
Glucose, Bld: 107 mg/dL — ABNORMAL HIGH (ref 70–99)
Potassium: 3.4 mmol/L — ABNORMAL LOW (ref 3.5–5.1)
Sodium: 142 mmol/L (ref 135–145)
Total Bilirubin: 0.8 mg/dL (ref 0.3–1.2)
Total Protein: 7.8 g/dL (ref 6.5–8.1)

## 2022-02-03 LAB — LIPASE, BLOOD: Lipase: 30 U/L (ref 11–51)

## 2022-02-03 MED ORDER — PREDNISONE 10 MG PO TABS: 40.0000 mg | ORAL_TABLET | Freq: Every day | ORAL | 0 refills | Status: AC

## 2022-02-03 MED ORDER — IOHEXOL 350 MG/ML SOLN
100.0000 mL | Freq: Once | INTRAVENOUS | Status: AC | PRN
  Administered 2022-02-03: 85 mL via INTRAVENOUS

## 2022-02-03 NOTE — ED Provider Notes (Signed)
[facility] Provider Note   CSN: [id number] Arrival date & time: 02/03/22  1526     History {Add pertinent medical, surgical, social history, OB history to HPI:1} Chief Complaint  Patient presents with   Abdominal Pain    [name] is a 76 y.o. female.  HPI     Left lower back pain Pain lifting left leg, difficulty getting in and out of car Have always had issues with stomach Has been having side pain on and off since November Had CT a few days ago, seeing Dr. For prolapse surgery Aug 2021  Today pain left side, worse going up and down Quarter to 11 it started, just standing and it started Took tylenol, kept drinking water  Called [facility] at [facility] Medications Prior to Admission medications   Medication Sig Start Date End Date Taking? Authorizing Provider  amLODipine (NORVASC) 5 MG tablet Take 1 tablet (5 mg total) by mouth daily. 11/29/21   [name], Evie Lacks, MD  aspirin 81 MG EC tablet Take 81 mg by mouth daily.    [provider]  bimatoprost (LUMIGAN) 0.01 % SOLN Place 1 drop into both eyes at bedtime.    [provider]  Blood Glucose Monitoring Suppl ([place]) w/Device KIT 1 each by Does not apply route in the morning, at noon, and at bedtime.     [provider]  Cholecalciferol (VITAMIN D3) 1.25 MG (50000 UT) CAPS TAKE ONE CAPSULE BY MOUTH EVERY 30 DAYS 09/22/21   [name], Evie Lacks, MD  [employer]S DIGESTIVE PROBIOTIC 250 MG capsule TAKE 1 CAPSULE BY MOUTH 2 TIMES DAILY. 11/28/21   [name], Evie Lacks, MD  Cyanocobalamin (NASCOBAL) 500 MCG/0.1ML SOLN USE 1 SPRAY IN 1 NOSTRIL  ONCE A WEEK 09/22/21   [name], Evie Lacks, MD  denosumab (PROLIA) 60 MG/ML SOLN injection Inject 60 mg into the skin every 6 (six) months. Administer in upper arm, thigh, or abdomen 09/02/13   [provider]  DENTA 5000 PLUS 1.1 % CREA dental cream Take by mouth as directed. 01/13/22   [provider]  diphenoxylate-atropine (LOMOTIL) 2.5-0.025 MG tablet TAKE 1 TABLET BY MOUTH FOUR TIMES A DAY AS NEEDED FOR DIARRHEA OR LOOSE STOOLS 09/22/21   [name], Evie Lacks, MD  furosemide (LASIX) 20 MG tablet TAKE 0.5-1 TABLETS (10-20 MG TOTAL) BY MOUTH DAILY AS NEEDED FOR EDEMA (FOR LEG SWELLING). 11/24/20   [name], Evie Lacks, MD  JARDIANCE 10 MG TABS tablet TAKE 1 TABLET (10 MG TOTAL) BY MOUTH DAILY WITH BREAKFAST. 12/29/21   Elayne Snare, MD  KLOR-CON M20 20 MEQ tablet TAKE 1 TABLET BY MOUTH EVERY DAY 02/15/21   [name], Evie Lacks, MD  OneTouch Delica Lancets 88T MISC USE AS DIRECTED TO CHECK BLOOD SUGAR IN THE MORNING, AT NOON, AND AT BEDTIME 08/06/20   Elayne Snare, MD  [facility] VERIO test strip USE ONETOUCH VERIO TEST STRIPS AS INSTRUCTED TO CHECK BLOOD SUGAR THREE TIMES DAILY. 08/27/21   Elayne Snare, MD  oxybutynin (DITROPAN) 5 MG tablet TAKE 1 TABLET BY MOUTH THREE TIMES A DAY 01/17/22   [name], Evie Lacks, MD  propranolol ER (INDERAL LA) 60 MG 24 hr capsule TAKE 1 CAPSULE (60 MG TOTAL) BY MOUTH 2 (TWO) TIMES DAILY. 12/06/21   [name], Evie Lacks, MD  RABEprazole (ACIPHEX) 20 MG tablet Take 1 tablet (20 mg total) by mouth daily. 09/22/21   [name], Evie Lacks, MD  timolol (TIMOPTIC) 0.5 % ophthalmic solution Place 1 drop into  both eyes 2 (two) times daily. 11/16/20   [provider]      Allergies    Actonel [risedronate sodium], Boniva [ibandronate sodium], Calcium channel blockers, Chlorthalidone, Compazine, Lyrica [pregabalin], and Tape    Review of Systems   Review of Systems  Physical Exam Updated Vital Signs BP (!) 177/77 (BP Location: Left Arm)   Pulse 67   Temp 98.3 F (36.8 C)   Resp 18   Wt 74.8 kg   SpO2 99%   BMI 28.32 kg/m  Physical Exam  ED Results / Procedures / Treatments   Labs (all labs ordered are listed, but only abnormal results are displayed) Labs Reviewed  URINALYSIS, ROUTINE W REFLEX MICROSCOPIC  CBC WITH DIFFERENTIAL/PLATELET  COMPREHENSIVE  METABOLIC PANEL  LIPASE, BLOOD    EKG None  Radiology No results found.  Procedures Procedures  {Document cardiac monitor, telemetry assessment procedure when appropriate:1}  Medications Ordered in ED Medications - No data to display  ED Course/ Medical Decision Making/ A&P                           Medical Decision Making Amount and/or Complexity of Data Reviewed Labs: ordered.   ***  {Document critical care time when appropriate:1} {Document review of labs and clinical decision tools ie heart score, Chads2Vasc2 etc:1}  {Document your independent review of radiology images, and any outside records:1} {Document your discussion with family members, caretakers, and with consultants:1} {Document social determinants of health affecting pt's care:1} {Document your decision making why or why not admission, treatments were needed:1} Final Clinical Impression(s) / ED Diagnoses Final diagnoses:  None    Rx / DC Orders ED Discharge Orders     None

## 2022-02-03 NOTE — Telephone Encounter (Signed)
LVM for pt to call the clinic back to discuss her CT results concerns.

## 2022-02-03 NOTE — ED Triage Notes (Signed)
Pt is here for left sided abdominal pain which began suddenly today.  Pt had a CT abd/pelvis on Tuesday which was WNL and she had this ordered due to having right sided abdominal pain. No n/v or urinary changes. LBM was today.

## 2022-02-03 NOTE — Discharge Instructions (Addendum)
It was a pleasure caring for you today! You may take acetaminophen 1000 mg 4 times a day for up to 1 week. This is the maximum dose of Tylenol/acetaminophen you can take from all sources. Please check other over-the-counter medications and prescriptions to ensure you are not taking other medications that contain acetaminophen.  You may also take ibuprofen 400 mg 6 times a day alternating with or at the same time as tylenol OR '600mg'$  4 times a day.  We also recommend over the counter lidocaine patch, heat and ice for pain.  Recommend close follow up with PCP and calling Neurosurgery as above.

## 2022-02-03 NOTE — Telephone Encounter (Signed)
Patient would like a call back concerning her CT scan - she would like to know what the next steps are for her.  Please advise

## 2022-02-03 NOTE — ED Notes (Signed)
Discharge paperwork given and understood. 

## 2022-02-05 NOTE — Telephone Encounter (Signed)
Prolia VOB initiated via [email or web address]  Last OV:  Next OV:  Last Prolia inj 09/22/21 Next Prolia inj due 03/23/22

## 2022-02-14 DIAGNOSIS — Z6828 Body mass index (BMI) 28.0-28.9, adult: Secondary | ICD-10-CM | POA: Diagnosis not present

## 2022-02-14 DIAGNOSIS — C569 Malignant neoplasm of unspecified ovary: Secondary | ICD-10-CM | POA: Diagnosis not present

## 2022-02-14 DIAGNOSIS — M47816 Spondylosis without myelopathy or radiculopathy, lumbar region: Secondary | ICD-10-CM | POA: Diagnosis not present

## 2022-02-17 DIAGNOSIS — M545 Low back pain, unspecified: Secondary | ICD-10-CM | POA: Diagnosis not present

## 2022-02-17 DIAGNOSIS — M47816 Spondylosis without myelopathy or radiculopathy, lumbar region: Secondary | ICD-10-CM | POA: Diagnosis not present

## 2022-02-17 DIAGNOSIS — M25551 Pain in right hip: Secondary | ICD-10-CM | POA: Diagnosis not present

## 2022-02-17 DIAGNOSIS — M4316 Spondylolisthesis, lumbar region: Secondary | ICD-10-CM | POA: Diagnosis not present

## 2022-02-17 DIAGNOSIS — M5136 Other intervertebral disc degeneration, lumbar region: Secondary | ICD-10-CM | POA: Diagnosis not present

## 2022-02-23 ENCOUNTER — Ambulatory Visit
Admission: RE | Admit: 2022-02-23 | Discharge: 2022-02-23 | Disposition: A | Discharge status: Home / Self Care | Payer: Medicare Other | Source: Ambulatory Visit | Attending: Internal Medicine | Admitting: Internal Medicine

## 2022-02-23 DIAGNOSIS — Z1231 Encounter for screening mammogram for malignant neoplasm of breast: Secondary | ICD-10-CM | POA: Diagnosis not present

## 2022-02-28 DIAGNOSIS — Z6828 Body mass index (BMI) 28.0-28.9, adult: Secondary | ICD-10-CM | POA: Diagnosis not present

## 2022-02-28 DIAGNOSIS — M47816 Spondylosis without myelopathy or radiculopathy, lumbar region: Secondary | ICD-10-CM | POA: Diagnosis not present

## 2022-03-01 ENCOUNTER — Telehealth: Payer: Self-pay

## 2022-03-01 NOTE — Telephone Encounter (Signed)
Pt called to schedule a yearly follow up with Dr. Tamela Oddi. Pt states her GYN Dr. Nicholas Lose has retired and she has been unable to get in with anyone else. She states after this visit with Tamela Oddi, she will look for one.  Pt is scheduled on 7/19 @ 1:45 with Dr. Tamela Oddi.

## 2022-03-14 NOTE — Telephone Encounter (Signed)
Prior auth required for PROLIA  PA PROCESS DETAILS: PA is required. Providers may call Medical Utilization at [phone] to initiate. Forms may be accessed online at [email or web address] & [email or web address] back to [phone] .   

## 2022-03-23 ENCOUNTER — Inpatient Hospital Stay: Payer: Medicare Other | Admitting: Obstetrics & Gynecology

## 2022-03-24 ENCOUNTER — Encounter: Payer: Self-pay | Admitting: Obstetrics & Gynecology

## 2022-03-30 ENCOUNTER — Inpatient Hospital Stay: Payer: Medicare Other | Attending: Obstetrics & Gynecology | Admitting: Obstetrics & Gynecology

## 2022-03-30 ENCOUNTER — Inpatient Hospital Stay: Payer: Medicare Other

## 2022-03-30 ENCOUNTER — Encounter: Payer: Self-pay | Admitting: Obstetrics & Gynecology

## 2022-03-30 ENCOUNTER — Other Ambulatory Visit: Payer: Self-pay

## 2022-03-30 VITALS — BP 152/44 | HR 63 | Temp 97.8°F | Resp 16 | Ht 64.0 in | Wt 168.0 lb

## 2022-03-30 DIAGNOSIS — Z9221 Personal history of antineoplastic chemotherapy: Secondary | ICD-10-CM | POA: Diagnosis not present

## 2022-03-30 DIAGNOSIS — Z8544 Personal history of malignant neoplasm of other female genital organs: Secondary | ICD-10-CM | POA: Insufficient documentation

## 2022-03-30 DIAGNOSIS — C5700 Malignant neoplasm of unspecified fallopian tube: Secondary | ICD-10-CM

## 2022-03-30 NOTE — Progress Notes (Signed)
Follow Up Note: Gyn-Onc  [name] 76 y.o. female  CC: She presents for a follow-up visit  HPI: She has a remote h/o a stage IIIC Fallopian tube cancer Oncology History  Fallopian tube cancer, carcinoma (HCC)  10/07/2008 Cancer Staging   Cancer Staging Fallopian tube cancer, carcinoma (HCC) Staging form: Fallopian Tube, AJCC 7th Edition - Clinical: IIIC - Unsigned - Pathologic: IIIC - Unsigned She was optimally debulked with minimal disease on peritoneal surfaces   04/14/2009 PET scan   NED   02/03/2014 Initial Diagnosis   Fallopian tube cancer, carcinoma (HCC)    - 02/03/2009 Chemotherapy   . She received 6 cycles of carboplatin and Taxol chemotherapy completed in June 2010     Tumor Marker   Patient's tumor was tested for the following markers: CA-125 Results of the tumor marker test revealed : 7 mo ago  (04/22/19) 2 yr ago  (11/20/17) 3 yr ago  (11/16/16) 3 yr ago  (03/21/16)  CA 125 <35 U/mL 10  9 CM       Interval History:  History remarkable for IBS, OAB and newly diagnosed DJD per the pt.  She has worsening abdominal/back pain. She recently presented to the ED in 6/23 w/abdominal/low back pain. CTAP was neg. A CA 123 in 1/23 was 7.  DJD.  The pain is mostly in the RUQ/flank.  Occasionally the pain is left-sided.  The pain is worse when riding in car, turning in bed.  She denies increasing abdominal girth or unexplained weight loss.    Review of Systems  Review of Systems  Constitutional:  Negative for malaise/fatigue.  Gastrointestinal:  Negative for abdominal pain and diarrhea.  Genitourinary:  Positive for frequency and urgency.    Current Meds:  Outpatient Encounter Medications as of 03/30/2022  Medication Sig   amLODipine (NORVASC) 5 MG tablet Take 1 tablet (5 mg total) by mouth daily.   aspirin 81 MG EC tablet Take 81 mg by mouth daily.   bimatoprost (LUMIGAN) 0.01 % SOLN  Place 1 drop into both eyes at bedtime.   Blood Glucose Monitoring Suppl (ONETOUCH VERIO FLEX SYSTEM) w/Device KIT 1 each by Does not apply route in the morning, at noon, and at bedtime.    Cholecalciferol (VITAMIN D3) 1.25 MG (50000 UT) CAPS TAKE ONE CAPSULE BY MOUTH EVERY 30 DAYS   [facility] DIGESTIVE PROBIOTIC 250 MG capsule TAKE 1 CAPSULE BY MOUTH 2 TIMES DAILY.   Cyanocobalamin (NASCOBAL) 500 MCG/0.1ML SOLN USE 1 SPRAY IN 1 NOSTRIL  ONCE A WEEK   denosumab (PROLIA) 60 MG/ML SOLN injection Inject 60 mg into the skin every 6 (six) months. Administer in upper arm, thigh, or abdomen   DENTA 5000 PLUS 1.1 % CREA dental cream Take by mouth as directed.   diphenoxylate-atropine (LOMOTIL) 2.5-0.025 MG tablet TAKE 1 TABLET BY MOUTH FOUR TIMES A DAY AS NEEDED FOR DIARRHEA OR LOOSE STOOLS   furosemide (LASIX) 20 MG tablet TAKE 0.5-1 TABLETS (10-20 MG TOTAL) BY MOUTH DAILY AS NEEDED FOR EDEMA (FOR LEG SWELLING).   JARDIANCE 10 MG TABS tablet TAKE 1 TABLET (10 MG TOTAL) BY MOUTH DAILY WITH BREAKFAST.   KLOR-CON M20 20 MEQ tablet TAKE 1 TABLET BY MOUTH EVERY DAY   OneTouch Delica Lancets 30G MISC USE AS DIRECTED TO CHECK BLOOD SUGAR IN THE MORNING, AT NOON, AND AT BEDTIME   ONETOUCH VERIO test strip USE ONETOUCH VERIO TEST STRIPS AS INSTRUCTED TO CHECK BLOOD SUGAR THREE TIMES DAILY.   propranolol ER (INDERAL LA)   60 MG 24 hr capsule TAKE 1 CAPSULE (60 MG TOTAL) BY MOUTH 2 (TWO) TIMES DAILY.   RABEprazole (ACIPHEX) 20 MG tablet Take 1 tablet (20 mg total) by mouth daily.   timolol (TIMOPTIC) 0.5 % ophthalmic solution Place 1 drop into both eyes 2 (two) times daily.   triamcinolone cream (KENALOG) 0.1 % APPLY TO AFFECTED AREA TWICE A DAY AS NEEDED   [DISCONTINUED] oxybutynin (DITROPAN) 5 MG tablet TAKE 1 TABLET BY MOUTH THREE TIMES A DAY   No facility-administered encounter medications on file as of 03/30/2022.    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  Tobacco Use   Smoking status: Never   Smokeless tobacco: Never  Vaping Use   Vaping Use: Never used  Substance and Sexual Activity   Alcohol use: No    Alcohol/week: 0.0 standard drinks of alcohol   Drug use: No   Sexual activity: Not on file  Other Topics Concern   Not on file  Social History Narrative   Lives with spouse   Caffeine use: cokes      Right handed    Social Determinants of Health   Financial Resource Strain: Low Risk  (12/31/2021)   Overall Financial Resource Strain (CARDIA)    Difficulty of Paying Living Expenses: Not hard at all  Food Insecurity: No Food Insecurity (12/31/2021)   Hunger Vital Sign    Worried About Running Out of Food in the Last Year: Never true    Ran Out of Food in the Last Year: Never true  Transportation Needs: No Transportation Needs (12/31/2021)   PRAPARE - Hydrologist (Medical): No    Lack of Transportation (Non-Medical): No  Physical Activity: Inactive (12/31/2021)   Exercise Vital Sign    Days of Exercise per Week: 0 days    Minutes of Exercise per Session: 0 min  Stress: No Stress Concern Present (12/31/2021)   [place]    Feeling of Stress : Not at all  Social Connections: Moderately Integrated (12/31/2021)   Social Connection and Isolation Panel [NHANES]    Frequency of Communication with Friends and Family: Twice a week     Frequency of Social Gatherings with Friends and Family: Twice a week    Attends Religious Services: More than 4 times per year    Active Member of [employer] or Organizations: No    Attends Archivist Meetings: Never    Marital Status: Married  Human resources officer Violence: Not At Risk (12/31/2021)   Humiliation, Afraid, Rape, and Kick questionnaire    Fear of Current or Ex-Partner: No    Emotionally Abused: No    Physically Abused: No  Sexually Abused: No    Past Surgical Hx:  Past Surgical History:  Procedure Laterality Date   ABDOMINAL HYSTERECTOMY     APPENDECTOMY     BLADDER SURGERY  04/09/2020   tack and sling   CHOLECYSTECTOMY N/A 05/15/2017   Procedure: LAPAROSCOPIC CHOLECYSTECTOMY WITH INTRAOPERATIVE CHOLANGIOGRAM AND LYSIS OF ADHESIONS;  Surgeon: Fanny Skates, MD;  Location: [facility];  Service: General;  Laterality: N/A;   COLONOSCOPY  2018   Dr.Perry   ELBOW FRACTURE SURGERY     age 71- left elbow   HEMORRHOID SURGERY  2001   Ovarian Cancer Debulking  09/2008   POLYPECTOMY      Past Medical Hx:  Past Medical History:  Diagnosis Date   Adenomatous colon polyp 04/08/2011   Anemia    Anxiety    Cataract    Diabetes mellitus without complication ([place])    "pre-diabetic" per pt   Essential hypertension 08/10/2007   Chronic  On Catapress (per Dr Ubaldo Glassing) - increase to bid - d/c Hydralazine prn per Dr Caryl Comes d/c 3/19: Increase Inderal LA to bid   GERD (gastroesophageal reflux disease)    Glaucoma (increased eye pressure)    Heart murmur    HTN (hypertension)    IBS (irritable bowel syndrome)    LBP (low back pain)    Menopause    Osteoporosis    Ovarian cancer ([place]) 09/2008   Dr Gwyneth Revels   Pancreatic cyst    Vitamin B12 deficiency    Vitamin D deficiency     Family Hx:  Family History  Problem Relation Age of Onset   Alzheimer's disease Mother 60   Other Mother 52       TAH for excessive bleeding   Lung cancer Father        smoker; metastasis to stomach  and other areas   Heart attack Maternal Uncle    Heart disease Maternal Uncle    Other Paternal Aunt        stomach issues   Heart Problems Paternal Uncle    Other Maternal Grandmother        stomach issues; +hysterectomy   Heart attack Maternal Grandfather    Heart disease Maternal Grandfather    Infertility Daughter    Stomach cancer Cousin        dx. mid-60s   Other Cousin        stomach issues   Leukemia Cousin 9   Stomach cancer Other    Cancer Cousin        unknown type   Other Cousin        stomach issues   Heart Problems Maternal Uncle    Diabetes Paternal Aunt    Heart Problems Paternal Uncle    Emphysema Paternal Uncle        work exposure   Breast cancer Cousin        dx. late 60s-early 70s   Colon cancer Neg Hx    Rectal cancer Neg Hx    Esophageal cancer Neg Hx     Vitals:  BP (!) 152/44 (BP Location: Left Arm, Patient Position: Sitting)   Pulse 63   Temp 97.8 F (36.6 C) (Tympanic)   Resp 16   Ht 5' 4" (1.626 m)   Wt 168 lb (76.2 kg)   SpO2 99%   BMI 28.84 kg/m      Physical Exam:  Physical Exam Constitutional:      General: She is not in acute distress. Abdominal:  General: There is no distension.     Palpations: Abdomen is soft. There is no mass.     Tenderness: There is no abdominal tenderness.  Genitourinary:    Vagina: Normal.     Comments: Angiokeratomas, b/l; short vagina, no palpable abnormality.  Bladder well supported.  Neurological:     Mental Status: She is alert and oriented to person, place, and time.     Assessment/Plan:   Fallopian tube cancer, carcinoma (HCC) 76 yo w/remote h/o a stage IIIC Fallopian tube cancer Suspect MSK pain generator.  Negative imaging, normal exam  >review the repeat CA 125 results; if negative-->f/u in 6 mos    I personally spent 30 minutes face-to-face and non-face-to-face in the care of this patient, which includes all pre, intra, and post visit time on the date of service.     Jackson-Moore, MD 03/30/2022, 10:43 AM  

## 2022-03-30 NOTE — Patient Instructions (Addendum)
CA125 today.  Return in 6 months. Call in October or November to get scheduled for January

## 2022-03-30 NOTE — Assessment & Plan Note (Addendum)
76 yo w/remote h/o a stage IIIC Fallopian tube cancer Suspect MSK pain generator.  Negative imaging, normal exam  >review the repeat CA 125 results; if negative-->f/u in 6 mos

## 2022-03-31 ENCOUNTER — Telehealth: Payer: Self-pay

## 2022-03-31 LAB — CA 125: Cancer Antigen (CA) 125: 11.1 U/mL (ref 0.0–38.1)

## 2022-03-31 NOTE — Telephone Encounter (Signed)
Per Joylene John NP, pt is aware of CA125 being stable and within normal range. Pt was thankful for the call and said she will see [place] in 6 months

## 2022-04-02 NOTE — Telephone Encounter (Signed)
Prior auth resubmitted via [employer] ID: [id number]

## 2022-04-04 ENCOUNTER — Telehealth: Payer: Self-pay | Admitting: Obstetrics & Gynecology

## 2022-04-04 NOTE — Telephone Encounter (Signed)
Please Call back concerning Prior Aurthorization

## 2022-04-07 MED ORDER — DENOSUMAB 60 MG/ML ~~LOC~~ SOSY: 60.0000 mg | PREFILLED_SYRINGE | Freq: Once | SUBCUTANEOUS | 0 refills | Status: AC

## 2022-04-07 NOTE — Addendum Note (Signed)
Addended by: Jasper Loser on: 04/07/2022 11:31 AM   Modules accepted: Orders

## 2022-04-07 NOTE — Telephone Encounter (Signed)
Key: [id number] - PA Case ID: [id number] Valid:09/09/21-09/09/22

## 2022-04-07 NOTE — Telephone Encounter (Signed)
Pt ready for scheduling on or after 04/07/22  Out-of-pocket cost due at time of visit: $0 - will pay [place] - Rx sent  Primary: Medicare Prolia co-insurance: 20% (approximately $276) Admin fee co-insurance: 20% (approximately $25)  Secondary: BCBS  Prolia co-insurance: 20% Admin fee co-insurance: 20%  Deductible:   Prior Auth:  PA# Valid:   ** This summary of benefits is an estimation of the patient's out-of-pocket cost. Exact cost may vary based on individual plan coverage.

## 2022-04-09 NOTE — Telephone Encounter (Signed)
Request tracking ID: [id number]

## 2022-04-10 ENCOUNTER — Other Ambulatory Visit: Payer: Self-pay | Admitting: Internal Medicine

## 2022-04-12 NOTE — Telephone Encounter (Addendum)
Pt ready for scheduling on or after 03/23/22  Out-of-pocket cost due at time of visit: $0 Specialty Pharmacy will provide OOP cost, Rx sent  Primary: Medicare Prolia co-insurance: 20% (approximately $276) Admin fee co-insurance: 20% (approximately $25)  Deductible: $226 of $226 met  Secondary: [employer] BCBS FEP Prolia co-insurance:  Admin fee co-insurance:   Deductible: $400 of $400 met  Prior Auth: APPROVED (specialty pharmacy) Key: [id number] - PA Case ID: [id number] Valid: 09/09/21-09/09/22  ** This summary of benefits is an estimation of the patient's out-of-pocket cost. Exact cost may vary based on individual plan coverage.

## 2022-04-13 ENCOUNTER — Other Ambulatory Visit: Payer: Self-pay | Admitting: Endocrinology

## 2022-04-22 ENCOUNTER — Telehealth: Payer: Self-pay

## 2022-04-22 NOTE — Telephone Encounter (Signed)
Pt husband is calling requesting that the Rx be sent in for pt to get her Prolia vaccine.  On the approval letter it states that the pt was approved and in order to obtain the Rx a copy of the letter and a Rx needed to be faxed to [phone].  Pt prolia appt is 04/26/22

## 2022-04-26 ENCOUNTER — Encounter: Payer: Self-pay | Admitting: Internal Medicine

## 2022-04-26 ENCOUNTER — Ambulatory Visit (INDEPENDENT_AMBULATORY_CARE_PROVIDER_SITE_OTHER): Payer: Medicare Other | Admitting: Internal Medicine

## 2022-04-26 ENCOUNTER — Telehealth: Payer: Self-pay | Admitting: Obstetrics & Gynecology

## 2022-04-26 DIAGNOSIS — R1031 Right lower quadrant pain: Secondary | ICD-10-CM

## 2022-04-26 DIAGNOSIS — E669 Obesity, unspecified: Secondary | ICD-10-CM

## 2022-04-26 DIAGNOSIS — R1032 Left lower quadrant pain: Secondary | ICD-10-CM | POA: Diagnosis not present

## 2022-04-26 DIAGNOSIS — K580 Irritable bowel syndrome with diarrhea: Secondary | ICD-10-CM

## 2022-04-26 DIAGNOSIS — E538 Deficiency of other specified B group vitamins: Secondary | ICD-10-CM

## 2022-04-26 DIAGNOSIS — M7071 Other bursitis of hip, right hip: Secondary | ICD-10-CM | POA: Diagnosis not present

## 2022-04-26 DIAGNOSIS — E1169 Type 2 diabetes mellitus with other specified complication: Secondary | ICD-10-CM | POA: Diagnosis not present

## 2022-04-26 NOTE — Telephone Encounter (Signed)
[name] from [employer] called and stated the Prolia order should be placed through Medicare. She stated medicare d is pt primary insurance and that's the insurance it needs to be filed with.   Maudie Mercury said she can be reached at [phone]

## 2022-04-26 NOTE — Assessment & Plan Note (Addendum)
8/23 CT IMPRESSION: Degenerative disc disease and degenerative facet disease that could relate to regional back pain. At L4-5, a moderate disc bulge in combination with facet and ligamentous hypertrophy results in moderate multifactorial stenosis that could cause neural  02/03/22 CT angio IMPRESSION: 1. Aortic atherosclerosis without evidence of aneurysmal dilatation, dissection, major vessel occlusion or hemodynamically significant stenosis of the arterial structures of the abdomen and pelvis. 2. Hepatic steatosis.   Better

## 2022-04-26 NOTE — Assessment & Plan Note (Signed)
Better w/Probiotics

## 2022-04-26 NOTE — Telephone Encounter (Signed)
Called [facility] spoke w/ Rep. Gave her msg.. inform her that pt is due for her prolia. [name] called customer care speciality they are going to mail prolia to MD address. If need copy pay assistant can call [phone].Marland KitchenJohny Chess

## 2022-04-26 NOTE — Assessment & Plan Note (Addendum)
8/23 CT IMPRESSION: Degenerative disc disease and degenerative facet disease that could relate to regional back pain. At L4-5, a moderate disc bulge in combination with facet and ligamentous hypertrophy results in moderate multifactorial stenosis that could cause neural  02/03/22 CT angio IMPRESSION: 1. Aortic atherosclerosis without evidence of aneurysmal dilatation, dissection, major vessel occlusion or hemodynamically significant stenosis of the arterial structures of the abdomen and pelvis. 2. Hepatic steatosis.   Resolved

## 2022-04-26 NOTE — Progress Notes (Signed)
Subjective:  Patient ID: [name], female    DOB: [birth year]  Age: 76 y.o. MRN: [id number]  CC: Follow-up (3 month f/u)   HPI [name] presents for LLQ abd pain on 02/03/22 - pt went to ER, LBP, HTN    Outpatient Medications Prior to Visit  Medication Sig Dispense Refill   amLODipine (NORVASC) 5 MG tablet Take 1 tablet (5 mg total) by mouth daily. 90 tablet 3   aspirin 81 MG EC tablet Take 81 mg by mouth daily.     bimatoprost (LUMIGAN) 0.01 % SOLN Place 1 drop into both eyes at bedtime.     Blood Glucose Monitoring Suppl (ONETOUCH VERIO FLEX SYSTEM) w/Device KIT 1 each by Does not apply route in the morning, at noon, and at bedtime.      Cholecalciferol (VITAMIN D3) 1.25 MG (50000 UT) CAPS TAKE ONE CAPSULE BY MOUTH EVERY 30 DAYS 3 capsule 3   [facility]S DIGESTIVE PROBIOTIC 250 MG capsule TAKE 1 CAPSULE BY MOUTH 2 TIMES DAILY. 50 capsule 2   Cyanocobalamin (NASCOBAL) 500 MCG/0.1ML SOLN USE 1 SPRAY IN 1 NOSTRIL  ONCE A WEEK 3 each 3   DENTA 5000 PLUS 1.1 % CREA dental cream Take by mouth as directed.     diphenoxylate-atropine (LOMOTIL) 2.5-0.025 MG tablet TAKE 1 TABLET BY MOUTH FOUR TIMES A DAY AS NEEDED FOR DIARRHEA OR LOOSE STOOLS 60 tablet 3   furosemide (LASIX) 20 MG tablet TAKE 0.5-1 TABLETS (10-20 MG TOTAL) BY MOUTH DAILY AS NEEDED FOR EDEMA (FOR LEG SWELLING). 90 tablet 1   JARDIANCE 10 MG TABS tablet TAKE 1 TABLET (10 MG TOTAL) BY MOUTH DAILY WITH BREAKFAST. 90 tablet 2   KLOR-CON M20 20 MEQ tablet TAKE 1 TABLET BY MOUTH EVERY DAY 90 tablet 3   Lancets (ONETOUCH DELICA PLUS [id number]) MISC USE AS DIRECTED TO CHECK BLOOD SUGAR IN THE MORNING, AT NOON, AND AT BEDTIME 300 each 2   ONETOUCH VERIO test strip USE ONETOUCH VERIO TEST STRIPS AS INSTRUCTED TO CHECK BLOOD SUGAR THREE TIMES DAILY. 300 strip 1   PROLIA 60 MG/ML SOSY injection Inject into the skin.     propranolol ER (INDERAL LA) 60 MG 24 hr capsule TAKE 1 CAPSULE (60 MG TOTAL) BY MOUTH 2 (TWO) TIMES DAILY. 180  capsule 3   RABEprazole (ACIPHEX) 20 MG tablet Take 1 tablet (20 mg total) by mouth daily. 90 tablet 3   timolol (TIMOPTIC) 0.5 % ophthalmic solution Place 1 drop into both eyes 2 (two) times daily.     triamcinolone cream (KENALOG) 0.1 % APPLY TO AFFECTED AREA TWICE A DAY AS NEEDED     No facility-administered medications prior to visit.    ROS: Review of Systems  Constitutional:  Negative for activity change, appetite change, chills, fatigue and unexpected weight change.  HENT:  Negative for congestion, mouth sores and sinus pressure.   Eyes:  Negative for visual disturbance.  Respiratory:  Negative for cough and chest tightness.   Gastrointestinal:  Positive for diarrhea. Negative for abdominal pain, nausea and vomiting.  Genitourinary:  Negative for difficulty urinating, frequency and vaginal pain.  Musculoskeletal:  Positive for arthralgias and back pain. Negative for gait problem.  Skin:  Negative for pallor and rash.  Neurological:  Negative for dizziness, tremors, weakness, numbness and headaches.  Psychiatric/Behavioral:  Negative for confusion, sleep disturbance and suicidal ideas. The patient is nervous/anxious.     Objective:  BP 130/68 (BP Location: Left Arm)   Pulse 65  Temp 98.3 F (36.8 C) (Oral)   Ht '5\' 4"'  (1.626 m)   Wt 168 lb 6.4 oz (76.4 kg)   SpO2 96%   BMI 28.91 kg/m   BP Readings from Last 3 Encounters:  04/26/22 130/68  03/30/22 (!) 152/44  02/03/22 (!) 147/110    Wt Readings from Last 3 Encounters:  04/26/22 168 lb 6.4 oz (76.4 kg)  03/30/22 168 lb (76.2 kg)  02/03/22 165 lb (74.8 kg)    Physical Exam Constitutional:      General: She is not in acute distress.    Appearance: She is well-developed. She is obese.  HENT:     Head: Normocephalic.     Right Ear: External ear normal.     Left Ear: External ear normal.     Nose: Nose normal.  Eyes:     General:        Right eye: No discharge.        Left eye: No discharge.      Conjunctiva/sclera: Conjunctivae normal.     Pupils: Pupils are equal, round, and reactive to light.  Neck:     Thyroid: No thyromegaly.     Vascular: No JVD.     Trachea: No tracheal deviation.  Cardiovascular:     Rate and Rhythm: Normal rate and regular rhythm.     Heart sounds: Normal heart sounds.  Pulmonary:     Effort: No respiratory distress.     Breath sounds: No stridor. No wheezing.  Abdominal:     General: Bowel sounds are normal. There is no distension.     Palpations: Abdomen is soft. There is no mass.     Tenderness: There is no abdominal tenderness. There is no guarding or rebound.  Musculoskeletal:        General: No tenderness.     Cervical back: Normal range of motion and neck supple. No rigidity.  Lymphadenopathy:     Cervical: No cervical adenopathy.  Skin:    Findings: No erythema or rash.  Neurological:     Mental Status: She is oriented to person, place, and time.     Cranial Nerves: No cranial nerve deficit.     Motor: No abnormal muscle tone.     Coordination: Coordination normal.     Deep Tendon Reflexes: Reflexes normal.  Psychiatric:        Behavior: Behavior normal.        Thought Content: Thought content normal.        Judgment: Judgment normal.     Lab Results  Component Value Date   WBC 5.6 02/03/2022   HGB 12.0 02/03/2022   HCT 35.7 (L) 02/03/2022   PLT 185 02/03/2022   GLUCOSE 107 (H) 02/03/2022   CHOL 163 11/06/2020   TRIG 113.0 11/06/2020   HDL 49.70 11/06/2020   LDLCALC 91 11/06/2020   ALT 17 02/03/2022   AST 22 02/03/2022   NA 142 02/03/2022   K 3.4 (L) 02/03/2022   CL 108 02/03/2022   CREATININE 0.97 02/03/2022   BUN 15 02/03/2022   CO2 23 02/03/2022   TSH 1.50 07/23/2018   HGBA1C 5.9 11/16/2021   MICROALBUR 10.3 (H) 11/16/2021    MM 3D SCREEN BREAST BILATERAL  Result Date: 02/24/2022 CLINICAL DATA:  Screening. EXAM: DIGITAL SCREENING BILATERAL MAMMOGRAM WITH TOMOSYNTHESIS AND CAD TECHNIQUE: Bilateral screening  digital craniocaudal and mediolateral oblique mammograms were obtained. Bilateral screening digital breast tomosynthesis was performed. The images were evaluated with computer-aided detection. COMPARISON:  Previous  exam(s). ACR Breast Density Category c: The breast tissue is heterogeneously dense, which may obscure small masses. FINDINGS: There are no findings suspicious for malignancy. IMPRESSION: No mammographic evidence of malignancy. A result letter of this screening mammogram will be mailed directly to the patient. RECOMMENDATION: Screening mammogram in one year. (Code:SM-B-01Y) BI-RADS CATEGORY  1: Negative. Electronically Signed   By: Margarette Canada M.D.   On: 02/24/2022 15:36    Assessment & Plan:   Problem List Items Addressed This Visit     B12 deficiency    On B12      Diabetes mellitus type 2 in obese [facility])    F/u w/Dr Dwyane Dee On Jardiance      IBS (irritable bowel syndrome)    Better w/Probiotics      Iliopsoas bursitis    Better       LLQ abdominal pain    8/23 CT IMPRESSION: Degenerative disc disease and degenerative facet disease that could relate to regional back pain. At L4-5, a moderate disc bulge in combination with facet and ligamentous hypertrophy results in moderate multifactorial stenosis that could cause neural  02/03/22  CT angio IMPRESSION: 1. Aortic atherosclerosis without evidence of aneurysmal dilatation, dissection, major vessel occlusion or hemodynamically significant stenosis of the arterial structures of the abdomen and pelvis. 2. Hepatic steatosis.   Resolved      RLQ abdominal pain    8/23 CT IMPRESSION: Degenerative disc disease and degenerative facet disease that could relate to regional back pain. At L4-5, a moderate disc bulge in combination with facet and ligamentous hypertrophy results in moderate multifactorial stenosis that could cause neural  02/03/22  CT angio IMPRESSION: 1. Aortic atherosclerosis without evidence of  aneurysmal dilatation, dissection, major vessel occlusion or hemodynamically significant stenosis of the arterial structures of the abdomen and pelvis. 2. Hepatic steatosis.   Better         No orders of the defined types were placed in this encounter.     Follow-up: No follow-ups on file.  Walker Kehr, MD

## 2022-04-26 NOTE — Assessment & Plan Note (Addendum)
F/u w/Dr Kumar On Jardiance 

## 2022-04-26 NOTE — Assessment & Plan Note (Signed)
On B12 

## 2022-04-26 NOTE — Assessment & Plan Note (Signed)
Better  

## 2022-04-29 ENCOUNTER — Other Ambulatory Visit: Payer: Self-pay | Admitting: Internal Medicine

## 2022-04-29 NOTE — Telephone Encounter (Signed)
Brandi C. From [facility] is suggesting that it be process as a Human resources officer through medicare to avoid the 542 copay. [employer] could still be billed secondary.  Please advise

## 2022-04-29 NOTE — Telephone Encounter (Signed)
Called [facility] to clarify if Prolia was ordered on Tues for pt spoke w/ rep Omar Person she stated I need to talk to custom care.[name] Spoke w/ Fredderick Severance in customer care she states the med was not ordered they had ran a claim. There was copay of $ 542.00 and they pt would have to call to order meds. Called pt spoke w/ pt and husband gave them the information that was given. Husband is going to call custom care to see if he can handle it and order med. Inform him when med is order he can call back for nurse visit for prolia.[name] (custom care # [phone]).Marland KitchenJohny Chess

## 2022-05-06 NOTE — Telephone Encounter (Signed)
Will pt be supplying this medication or will we use the supply in office?  Pt want to get scheduled but there is come confusion on who will supply the medication.  Please advise

## 2022-05-11 NOTE — Telephone Encounter (Signed)
That's where the confusion is. [facility] says that it should be processed through [place] and we use our supply. They were telling the patient it was going to be $542 to get it from them.

## 2022-05-19 NOTE — Telephone Encounter (Signed)
The prescription drug plan requires that this [facility]    Pt can self enroll in the [place] to help assist with the cost of the medication. Once patient has enrolled, pt will receive a Copay "debit" card in the mail. She will provide the pharmacy with information on this card. She will have a $25 copay once the card benefits kick in.   59% of commercial prescriptions cost $50 or less per six months, the remaining 41% of commercial prescriptions cost an average of $437 per six months.  Your out-of-pocket costs may vary depending on your insurance plan. Each plan has different out-of-pocket costs, and most include an annual deductible. Patients on high deductible plans may pay more out-of-pocket for Prolia. And eligible commercial patients could pay $25 or less with the [place] for 1 shot of Prolia up to the program maximum of $1,500 per calendar year.???  Based on paid claims data from national data providers for the period 09/05/2016 - 09/04/2017. These data do not include out-of-pocket costs related to office visits or administration of Prolia. Your actual cost may vary depending on your insurance coverage and eligibility for and participation in support programs such as the Prolia co-pay program. Talk to your insurance provider for specific information about your prescription coverage. ???This program does not provide support for any physician-related services associated with administration of Prolia. See eligibility and limits. For more information about this program and eligibility requirements, call [phone] or visit [email or web address]. The [place] is issued by [employer] pursuant to license by [employer] No cash or ATM access. [employer] is a Chief of Staff. This card can be used only to cover  co-payment for eligible prescriptions covered under the program at participating merchant locations where [employer] is accepted.

## 2022-05-24 ENCOUNTER — Other Ambulatory Visit (INDEPENDENT_AMBULATORY_CARE_PROVIDER_SITE_OTHER): Payer: Medicare Other

## 2022-05-24 DIAGNOSIS — E785 Hyperlipidemia, unspecified: Secondary | ICD-10-CM

## 2022-05-24 DIAGNOSIS — E669 Obesity, unspecified: Secondary | ICD-10-CM | POA: Diagnosis not present

## 2022-05-24 DIAGNOSIS — E1169 Type 2 diabetes mellitus with other specified complication: Secondary | ICD-10-CM

## 2022-05-24 LAB — HEMOGLOBIN A1C: Hgb A1c MFr Bld: 6 % (ref 4.6–6.5)

## 2022-05-24 LAB — BASIC METABOLIC PANEL
BUN: 30 mg/dL — ABNORMAL HIGH (ref 6–23)
CO2: 27 mEq/L (ref 19–32)
Calcium: 9.9 mg/dL (ref 8.4–10.5)
Chloride: 105 mEq/L (ref 96–112)
Creatinine, Ser: 1.24 mg/dL — ABNORMAL HIGH (ref 0.40–1.20)
GFR: 42.29 mL/min — ABNORMAL LOW (ref >60.00)
Glucose, Bld: 106 mg/dL — ABNORMAL HIGH (ref 70–99)
Potassium: 4 mEq/L (ref 3.5–5.1)
Sodium: 141 mEq/L (ref 135–145)

## 2022-05-24 LAB — LDL CHOLESTEROL, DIRECT: Direct LDL: 122 mg/dL

## 2022-05-26 ENCOUNTER — Telehealth (INDEPENDENT_AMBULATORY_CARE_PROVIDER_SITE_OTHER): Payer: Medicare Other | Admitting: Endocrinology

## 2022-05-26 DIAGNOSIS — E785 Hyperlipidemia, unspecified: Secondary | ICD-10-CM | POA: Diagnosis not present

## 2022-05-26 DIAGNOSIS — E876 Hypokalemia: Secondary | ICD-10-CM | POA: Diagnosis not present

## 2022-05-26 DIAGNOSIS — E669 Obesity, unspecified: Secondary | ICD-10-CM | POA: Diagnosis not present

## 2022-05-26 DIAGNOSIS — E1169 Type 2 diabetes mellitus with other specified complication: Secondary | ICD-10-CM

## 2022-05-26 MED ORDER — ROSUVASTATIN CALCIUM 5 MG PO TABS: 5.0000 mg | ORAL_TABLET | Freq: Every day | ORAL | 3 refills | Status: DC

## 2022-05-26 NOTE — Progress Notes (Signed)
Patient ID: [name], female   DOB: [birth year], 76 y.o.   MRN: [id number]           I connected with the above-named patient by video enabled telemedicine application and verified that I am speaking with the correct person. The patient was explained the limitations of evaluation and management by telemedicine and the availability of in person appointments.  Patient also understood that there may be a patient responsible charge related to this service  Location of the patient: Patient's home  Location of the provider: Physician office Only the patient and myself were participating in the encounter The patient understood the above statements and agreed to proceed.  Reason for Appointment: Follow up  Primary care physician: [name]  History of Present Illness:      Previously had A1c levels in the prediabetic range and had been at baseline upper normal around 6.0  She was evaluated with a glucose tolerance test in 10/2015 which showed the following: Fasting glucose 122, one-hour glucose 202 hour glucose 147  She was referred to the dietitian for meal planning and advised weight loss  Recent history:  Her A1c has been ranging between 5.7 -6.4 generally and now 6.0   Current medications for diabetes: Jardiance 10 mg daily, started in 11/20  With her having high blood sugars over 125 fasting she was diagnosed with overt diabetes Also has history of background retinopathy  She has been treated with Jardiance 10 mg, intolerant to metformin She is able to keep her blood sugars fairly well controlled with recently most sugars under 160 but meter was not downloaded She thinks she is having more desserts like pies Her weight this year has gone up significantly, now 170 at home She is more exercise but also has had issues with low back pain until recently  Last nutritional consultation was in 04/2018  Recent blood sugars from meter review  PRE-MEAL Fasting Lunch Dinner Bedtime  Overall  Glucose range: 98-125  95 148   Mean/median:        POST-MEAL PC Breakfast PC Lunch PC Dinner  Glucose range: 154, 88 145, 122, 130   Mean/median:      Previously  PRE-MEAL Fasting Lunch Dinner Bedtime Overall  Glucose range: 93-113    82-128  Mean/median: 99    106   POST-MEAL PC Breakfast PC Lunch PC Dinner  Glucose range: 82-127  82-127  Mean/median:  118 106     Weight history:  Wt Readings from Last 3 Encounters:  04/26/22 168 lb 6.4 oz (76.4 kg)  03/30/22 168 lb (76.2 kg)  02/03/22 165 lb (74.8 kg)    Glycemic levels:   Lab Results  Component Value Date   HGBA1C 6.0 05/24/2022   HGBA1C 5.9 11/16/2021   HGBA1C 5.6 05/18/2021   Lab Results  Component Value Date   MICROALBUR 10.3 (H) 11/16/2021   LDLCALC 91 11/06/2020   CREATININE 1.24 (H) 05/24/2022    Other problems discussed today: See review of systems   Allergies as of 05/26/2022       Reactions   Actonel [risedronate Sodium]    Upset stomach   Boniva [ibandronate Sodium]    cramp   Calcium Channel Blockers    Upset stomach   Chlorthalidone    Diarrhea per pt   Compazine    Daughter reacts to compazine/pt does not want to take   Lyrica [pregabalin]    Dizzy   Tape    redness  Medication List        Accurate as of May 26, 2022 12:58 PM. If you have any questions, ask your nurse or doctor.          amLODipine 5 MG tablet Commonly known as: NORVASC Take 1 tablet (5 mg total) by mouth daily.   aspirin EC 81 MG tablet Take 81 mg by mouth daily.   bimatoprost 0.01 % Soln Commonly known as: LUMIGAN Place 1 drop into both eyes at bedtime.   [facility] Digestive Probiotic 250 MG capsule Generic drug: saccharomyces boulardii TAKE 1 CAPSULE BY MOUTH 2 TIMES DAILY.   Denta 5000 Plus 1.1 % Crea dental cream Generic drug: sodium fluoride Take by mouth as directed.   diphenoxylate-atropine 2.5-0.025 MG tablet Commonly known as: LOMOTIL TAKE 1 TABLET BY MOUTH FOUR  TIMES A DAY AS NEEDED FOR DIARRHEA OR LOOSE STOOLS   furosemide 20 MG tablet Commonly known as: LASIX TAKE 0.5-1 TABLETS (10-20 MG TOTAL) BY MOUTH DAILY AS NEEDED FOR EDEMA (FOR LEG SWELLING).   Jardiance 10 MG Tabs tablet Generic drug: empagliflozin TAKE 1 TABLET (10 MG TOTAL) BY MOUTH DAILY WITH BREAKFAST.   Klor-Con M20 20 MEQ tablet Generic drug: potassium chloride SA TAKE 1 TABLET BY MOUTH EVERY DAY   Nascobal 500 MCG/0.1ML Soln Generic drug: Cyanocobalamin USE 1 SPRAY IN 1 NOSTRIL  ONCE A WEEK   OneTouch Delica Plus [id number] Misc USE AS DIRECTED TO CHECK BLOOD SUGAR IN THE MORNING, AT NOON, AND AT BEDTIME   OneTouch Verio Flex System w/Device Kit 1 each by Does not apply route in the morning, at noon, and at bedtime.   OneTouch Verio test strip Generic drug: glucose blood USE ONETOUCH VERIO TEST STRIPS AS INSTRUCTED TO CHECK BLOOD SUGAR THREE TIMES DAILY.   Prolia 60 MG/ML Sosy injection Generic drug: denosumab Inject into the skin.   propranolol ER 60 MG 24 hr capsule Commonly known as: INDERAL LA TAKE 1 CAPSULE (60 MG TOTAL) BY MOUTH 2 (TWO) TIMES DAILY.   RABEprazole 20 MG tablet Commonly known as: ACIPHEX Take 1 tablet (20 mg total) by mouth daily.   timolol 0.5 % ophthalmic solution Commonly known as: TIMOPTIC Place 1 drop into both eyes 2 (two) times daily.   triamcinolone cream 0.1 % Commonly known as: KENALOG APPLY TO AFFECTED AREA TWICE A DAY AS NEEDED   Vitamin D3 1.25 MG (50000 UT) Caps TAKE ONE CAPSULE BY MOUTH EVERY 30 DAYS        Allergies:  Allergies  Allergen Reactions   Actonel [Risedronate Sodium]     Upset stomach   Boniva [Ibandronate Sodium]     cramp   Calcium Channel Blockers     Upset stomach   Chlorthalidone     Diarrhea per pt   Compazine     Daughter reacts to compazine/pt does not want to take   Lyrica [Pregabalin]     Dizzy    Tape     redness    Past Medical History:  Diagnosis Date   Adenomatous colon  polyp 04/08/2011   Anemia    Anxiety    Cataract    Diabetes mellitus without complication ([place])    "pre-diabetic" per pt   Essential hypertension 08/10/2007   Chronic  On Catapress (per Dr Ubaldo Glassing) - increase to bid - d/c Hydralazine prn per Dr Caryl Comes d/c 3/19: Increase Inderal LA to bid   GERD (gastroesophageal reflux disease)    Glaucoma (increased eye pressure)    Heart murmur  HTN (hypertension)    IBS (irritable bowel syndrome)    LBP (low back pain)    Menopause    Osteoporosis    Ovarian cancer ([place]) 09/2008   Dr Gwyneth Revels   Pancreatic cyst    Vitamin B12 deficiency    Vitamin D deficiency     Past Surgical History:  Procedure Laterality Date   ABDOMINAL HYSTERECTOMY     APPENDECTOMY     BLADDER SURGERY  04/09/2020   tack and sling   CHOLECYSTECTOMY N/A 05/15/2017   Procedure: LAPAROSCOPIC CHOLECYSTECTOMY WITH INTRAOPERATIVE CHOLANGIOGRAM AND LYSIS OF ADHESIONS;  Surgeon: Fanny Skates, MD;  Location: [facility];  Service: General;  Laterality: N/A;   COLONOSCOPY  2018   Dr.Perry   ELBOW FRACTURE SURGERY     age 5- left elbow   HEMORRHOID SURGERY  2001   Ovarian Cancer Debulking  09/2008   POLYPECTOMY      Family History  Problem Relation Age of Onset   Alzheimer's disease Mother 48   Other Mother 58       TAH for excessive bleeding   Lung cancer Father        smoker; metastasis to stomach and other areas   Heart attack Maternal Uncle    Heart disease Maternal Uncle    Other Paternal Aunt        stomach issues   Heart Problems Paternal Uncle    Other Maternal Grandmother        stomach issues; +hysterectomy   Heart attack Maternal Grandfather    Heart disease Maternal Grandfather    Infertility Daughter    Stomach cancer Cousin        dx. mid-60s   Other Cousin        stomach issues   Leukemia Cousin 18   Stomach cancer Other    Cancer Cousin        unknown type   Other Cousin        stomach issues   Heart Problems Maternal Uncle    Diabetes  Paternal Aunt    Heart Problems Paternal Uncle    Emphysema Paternal Uncle        work exposure   Breast cancer Cousin        dx. late 60s-early 70s   Colon cancer Neg Hx    Rectal cancer Neg Hx    Esophageal cancer Neg Hx     Social History:  reports that she has never smoked. She has never used smokeless tobacco. She reports that she does not drink alcohol and does not use drugs.    Review of Systems     Lipid history: She has not been on any statin drugs, lipids followed by her PCP She has been recommended statin drug but she has not been wanting to start However her LDL is now significantly higher than last year   Lab Results  Component Value Date   CHOL 163 11/06/2020   HDL 49.70 11/06/2020   Atkins 91 11/06/2020   LDLDIRECT 122.0 05/24/2022   TRIG 113.0 11/06/2020   CHOLHDL 3 11/06/2020         RETINOPATHY: She is followed by ophthalmologist for background retinopathy  Hypertension followed by PCP   She is on amlodipine and Inderal  She does monitor at home   BP Readings from Last 3 Encounters:  04/26/22 130/68  03/30/22 (!) 152/44  02/03/22 (!) 147/110   Lab Results  Component Value Date   CREATININE 1.24 (H) 05/24/2022  CREATININE 0.97 02/03/2022   CREATININE 1.21 (H) 01/24/2022     HYPOKALEMIA: Has taken potassium supplements  Lab Results  Component Value Date   K 4.0 05/24/2022    Review of Systems   LABS:  Lab on 05/24/2022  Component Date Value Ref Range Status   Direct LDL 05/24/2022 122.0  mg/dL Final   Optimal:  <100 mg/dLNear or Above Optimal:  100-129 mg/dLBorderline High:  130-159 mg/dLHigh:  160-189 mg/dLVery High:  >190 mg/dL   Sodium 05/24/2022 141  135 - 145 mEq/L Final   Potassium 05/24/2022 4.0  3.5 - 5.1 mEq/L Final   Chloride 05/24/2022 105  96 - 112 mEq/L Final   CO2 05/24/2022 27  19 - 32 mEq/L Final   Glucose, Bld 05/24/2022 106 (H)  70 - 99 mg/dL Final   BUN 05/24/2022 30 (H)  6 - 23 mg/dL Final   Creatinine,  Ser 05/24/2022 1.24 (H)  0.40 - 1.20 mg/dL Final   GFR 05/24/2022 42.29 (L)  >60.00 mL/min Final   Calculated using the CKD-EPI Creatinine Equation (2021)   Calcium 05/24/2022 9.9  8.4 - 10.5 mg/dL Final   Hgb A1c MFr Bld 05/24/2022 6.0  4.6 - 6.5 % Final   Glycemic Control Guidelines for People with Diabetes:Non Diabetic:  <6%Goal of Therapy: <7%Additional Action Suggested:  >8%     Physical Examination:  There were no vitals taken for this visit.     ASSESSMENT/PLAN:  DIABETES with mild obesity and history of mild retinopathy:  She has mild diabetes based on fasting blood sugars over 130  A1c is stable at 6 Blood sugars are fairly good at home but only a few recent readings were obtained She likely has high readings after eating some desserts Not able to exercise and has gained weight  Since she thinks she can do better with diet and exercise we will continue her Jardiance unchanged   HYPERCHOLESTEROLEMIA: With her risk factors and now LDL above 100 she agrees to go on Crestor 5 mg daily She can try taking this every other day to start with and then daily Follow-up with PCP in December  Hypokalemia: Controlled   Renal function: Somewhat variable and not clear why it is slightly higher now Encouraged her to increase fluid intake and discuss further with PCP  Total visit time including counseling for multiple problems = 30 minutes  Elayne Snare 05/26/2022, 12:58 PM   Note: This office note was prepared with Dragon voice recognition system technology. Any transcriptional errors that result from this process are unintentional.   Flu vaccine given

## 2022-05-30 ENCOUNTER — Other Ambulatory Visit: Payer: Self-pay | Admitting: Internal Medicine

## 2022-06-15 DIAGNOSIS — H40023 Open angle with borderline findings, high risk, bilateral: Secondary | ICD-10-CM | POA: Diagnosis not present

## 2022-06-15 DIAGNOSIS — H04123 Dry eye syndrome of bilateral lacrimal glands: Secondary | ICD-10-CM | POA: Diagnosis not present

## 2022-06-15 DIAGNOSIS — H2513 Age-related nuclear cataract, bilateral: Secondary | ICD-10-CM | POA: Diagnosis not present

## 2022-07-15 DIAGNOSIS — Z87448 Personal history of other diseases of urinary system: Secondary | ICD-10-CM | POA: Diagnosis not present

## 2022-07-15 DIAGNOSIS — N3941 Urge incontinence: Secondary | ICD-10-CM | POA: Diagnosis not present

## 2022-07-15 DIAGNOSIS — Z960 Presence of urogenital implants: Secondary | ICD-10-CM | POA: Diagnosis not present

## 2022-07-15 DIAGNOSIS — N3281 Overactive bladder: Secondary | ICD-10-CM | POA: Diagnosis not present

## 2022-07-15 DIAGNOSIS — Z87442 Personal history of urinary calculi: Secondary | ICD-10-CM | POA: Diagnosis not present

## 2022-07-15 DIAGNOSIS — R109 Unspecified abdominal pain: Secondary | ICD-10-CM | POA: Diagnosis not present

## 2022-07-20 DIAGNOSIS — Z85828 Personal history of other malignant neoplasm of skin: Secondary | ICD-10-CM | POA: Diagnosis not present

## 2022-07-20 DIAGNOSIS — D1801 Hemangioma of skin and subcutaneous tissue: Secondary | ICD-10-CM | POA: Diagnosis not present

## 2022-07-20 DIAGNOSIS — L570 Actinic keratosis: Secondary | ICD-10-CM | POA: Diagnosis not present

## 2022-07-20 DIAGNOSIS — L814 Other melanin hyperpigmentation: Secondary | ICD-10-CM | POA: Diagnosis not present

## 2022-07-20 DIAGNOSIS — L821 Other seborrheic keratosis: Secondary | ICD-10-CM | POA: Diagnosis not present

## 2022-08-10 ENCOUNTER — Telehealth: Payer: Self-pay | Admitting: *Deleted

## 2022-08-10 ENCOUNTER — Encounter: Payer: Self-pay | Admitting: Internal Medicine

## 2022-08-10 ENCOUNTER — Ambulatory Visit (INDEPENDENT_AMBULATORY_CARE_PROVIDER_SITE_OTHER): Payer: Medicare Other | Admitting: Internal Medicine

## 2022-08-10 VITALS — BP 126/78 | HR 60 | Temp 98.0°F | Ht 64.0 in | Wt 172.0 lb

## 2022-08-10 DIAGNOSIS — E538 Deficiency of other specified B group vitamins: Secondary | ICD-10-CM | POA: Diagnosis not present

## 2022-08-10 DIAGNOSIS — R1084 Generalized abdominal pain: Secondary | ICD-10-CM

## 2022-08-10 DIAGNOSIS — E785 Hyperlipidemia, unspecified: Secondary | ICD-10-CM | POA: Diagnosis not present

## 2022-08-10 DIAGNOSIS — E1169 Type 2 diabetes mellitus with other specified complication: Secondary | ICD-10-CM | POA: Diagnosis not present

## 2022-08-10 DIAGNOSIS — E669 Obesity, unspecified: Secondary | ICD-10-CM

## 2022-08-10 DIAGNOSIS — M810 Age-related osteoporosis without current pathological fracture: Secondary | ICD-10-CM | POA: Diagnosis not present

## 2022-08-10 NOTE — Assessment & Plan Note (Signed)
Prolia  Potential benefits of a long term Prolia use as well as potential risks  and complications were explained to the patient and were aknowledged.

## 2022-08-10 NOTE — Assessment & Plan Note (Signed)
Better on Crestor!

## 2022-08-10 NOTE — Assessment & Plan Note (Signed)
Started on Crestor

## 2022-08-10 NOTE — Telephone Encounter (Signed)
This pt here to see Dr. [name] . She is needing to get her Prolia inj she states she has not had a prolia shot since January. Theadora Rama can you check w/ insurance on this one.Marland KitchenJohny Chess

## 2022-08-10 NOTE — Assessment & Plan Note (Signed)
F/u w/Dr Dwyane Dee On Jardiance

## 2022-08-10 NOTE — Assessment & Plan Note (Signed)
On B12 

## 2022-08-10 NOTE — Progress Notes (Signed)
Subjective:  Patient ID: [name], female    DOB: 1946-08-19  Age: 76 y.o. MRN: [id number]  CC: Follow-up (Aches and pain around sides )   HPI [name] presents for HTN, palpitations, DM On Crestor now  Outpatient Medications Prior to Visit  Medication Sig Dispense Refill   amLODipine (NORVASC) 5 MG tablet Take 1 tablet (5 mg total) by mouth daily. 90 tablet 3   aspirin 81 MG EC tablet Take 81 mg by mouth daily.     bimatoprost (LUMIGAN) 0.01 % SOLN Place 1 drop into both eyes at bedtime.     Blood Glucose Monitoring Suppl (ONETOUCH VERIO FLEX SYSTEM) w/Device KIT 1 each by Does not apply route in the morning, at noon, and at bedtime.      Cholecalciferol (VITAMIN D3) 1.25 MG (50000 UT) CAPS TAKE ONE CAPSULE BY MOUTH EVERY 30 DAYS 3 capsule 3   [facility] DIGESTIVE PROBIOTIC 250 MG capsule TAKE 1 CAPSULE BY MOUTH 2 TIMES DAILY. 50 capsule 2   Cyanocobalamin (NASCOBAL) 500 MCG/0.1ML SOLN USE 1 SPRAY IN 1 NOSTRIL  ONCE A WEEK 3 each 3   DENTA 5000 PLUS 1.1 % CREA dental cream Take by mouth as directed.     diphenoxylate-atropine (LOMOTIL) 2.5-0.025 MG tablet TAKE 1 TABLET BY MOUTH FOUR TIMES A DAY AS NEEDED FOR DIARRHEA OR LOOSE STOOLS 60 tablet 3   furosemide (LASIX) 20 MG tablet TAKE 0.5-1 TABLETS (10-20 MG TOTAL) BY MOUTH DAILY AS NEEDED FOR EDEMA (FOR LEG SWELLING). 90 tablet 1   JARDIANCE 10 MG TABS tablet TAKE 1 TABLET (10 MG TOTAL) BY MOUTH DAILY WITH BREAKFAST. 90 tablet 2   KLOR-CON M20 20 MEQ tablet TAKE 1 TABLET BY MOUTH EVERY DAY 90 tablet 3   Lancets (ONETOUCH DELICA PLUS [id number]) MISC USE AS DIRECTED TO CHECK BLOOD SUGAR IN THE MORNING, AT NOON, AND AT BEDTIME 300 each 2   ONETOUCH VERIO test strip USE ONETOUCH VERIO TEST STRIPS AS INSTRUCTED TO CHECK BLOOD SUGAR THREE TIMES DAILY. 300 strip 1   PROLIA 60 MG/ML SOSY injection Inject into the skin.     propranolol ER (INDERAL LA) 60 MG 24 hr capsule TAKE 1 CAPSULE (60 MG TOTAL) BY MOUTH 2 (TWO) TIMES DAILY. 180  capsule 3   RABEprazole (ACIPHEX) 20 MG tablet Take 1 tablet (20 mg total) by mouth daily. 90 tablet 3   rosuvastatin (CRESTOR) 5 MG tablet Take 1 tablet (5 mg total) by mouth daily. 30 tablet 3   timolol (TIMOPTIC) 0.5 % ophthalmic solution Place 1 drop into both eyes 2 (two) times daily.     triamcinolone cream (KENALOG) 0.1 % APPLY TO AFFECTED AREA TWICE A DAY AS NEEDED     No facility-administered medications prior to visit.    ROS: Review of Systems  Constitutional:  Negative for activity change, appetite change, chills, fatigue and unexpected weight change.  HENT:  Negative for congestion, mouth sores and sinus pressure.   Eyes:  Negative for visual disturbance.  Respiratory:  Negative for cough, chest tightness and wheezing.   Cardiovascular:  Negative for leg swelling.  Gastrointestinal:  Positive for abdominal distention and abdominal pain. Negative for nausea and vomiting.  Genitourinary:  Negative for difficulty urinating, frequency and vaginal pain.  Musculoskeletal:  Positive for arthralgias. Negative for back pain and gait problem.  Skin:  Negative for pallor and rash.  Neurological:  Negative for dizziness, tremors, weakness, numbness and headaches.  Psychiatric/Behavioral:  Negative for confusion and sleep  disturbance. The patient is not nervous/anxious.     Objective:  BP 126/78 (BP Location: Left Arm, Patient Position: Sitting, Cuff Size: Normal)   Pulse 60   Temp 98 F (36.7 C) (Oral)   Ht _0  (1.626 m)   Wt 172 lb (78 kg)   SpO2 98%   BMI 29.52 kg/m   BP Readings from Last 3 Encounters:  08/10/22 126/78  04/26/22 130/68  03/30/22 (!) 152/44    Wt Readings from Last 3 Encounters:  08/10/22 172 lb (78 kg)  04/26/22 168 lb 6.4 oz (76.4 kg)  03/30/22 168 lb (76.2 kg)    Physical Exam Constitutional:      General: She is not in acute distress.    Appearance: She is well-developed.  HENT:     Head: Normocephalic.     Right Ear: External ear normal.      Left Ear: External ear normal.     Nose: Nose normal.  Eyes:     General:        Right eye: No discharge.        Left eye: No discharge.     Conjunctiva/sclera: Conjunctivae normal.     Pupils: Pupils are equal, round, and reactive to light.  Neck:     Thyroid: No thyromegaly.     Vascular: No JVD.     Trachea: No tracheal deviation.  Cardiovascular:     Rate and Rhythm: Normal rate and regular rhythm.     Heart sounds: Normal heart sounds.  Pulmonary:     Effort: No respiratory distress.     Breath sounds: No stridor. No wheezing.  Abdominal:     General: Bowel sounds are normal. There is no distension.     Palpations: Abdomen is soft. There is no mass.     Tenderness: There is no abdominal tenderness. There is no guarding or rebound.  Musculoskeletal:        General: No tenderness.     Cervical back: Normal range of motion and neck supple. No rigidity.  Lymphadenopathy:     Cervical: No cervical adenopathy.  Skin:    Findings: No erythema or rash.  Neurological:     Mental Status: She is oriented to person, place, and time.     Cranial Nerves: No cranial nerve deficit.     Motor: No abnormal muscle tone.     Coordination: Coordination normal.     Deep Tendon Reflexes: Reflexes normal.  Psychiatric:        Behavior: Behavior normal.        Thought Content: Thought content normal.        Judgment: Judgment normal.    Lab Results  Component Value Date   WBC 5.6 02/03/2022   HGB 12.0 02/03/2022   HCT 35.7 (L) 02/03/2022   PLT 185 02/03/2022   GLUCOSE 106 (H) 05/24/2022   CHOL 163 11/06/2020   TRIG 113.0 11/06/2020   HDL 49.70 11/06/2020   LDLDIRECT 122.0 05/24/2022   LDLCALC 91 11/06/2020   ALT 17 02/03/2022   AST 22 02/03/2022   NA 141 05/24/2022   K 4.0 05/24/2022   CL 105 05/24/2022   CREATININE 1.24 (H) 05/24/2022   BUN 30 (H) 05/24/2022   CO2 27 05/24/2022   TSH 1.50 07/23/2018   HGBA1C 6.0 05/24/2022   MICROALBUR 10.3 (H) 11/16/2021    MM 3D  SCREEN BREAST BILATERAL  Result Date: 02/24/2022 CLINICAL DATA:  Screening. EXAM: DIGITAL SCREENING BILATERAL MAMMOGRAM WITH TOMOSYNTHESIS AND  CAD TECHNIQUE: Bilateral screening digital craniocaudal and mediolateral oblique mammograms were obtained. Bilateral screening digital breast tomosynthesis was performed. The images were evaluated with computer-aided detection. COMPARISON:  Previous exam(s). ACR Breast Density Category c: The breast tissue is heterogeneously dense, which may obscure small masses. FINDINGS: There are no findings suspicious for malignancy. IMPRESSION: No mammographic evidence of malignancy. A result letter of this screening mammogram will be mailed directly to the patient. RECOMMENDATION: Screening mammogram in one year. (Code:SM-B-01Y) BI-RADS CATEGORY  1: Negative. Electronically Signed   By: Margarette Canada M.D.   On: 02/24/2022 15:36    Assessment & Plan:   Problem List Items Addressed This Visit     Abdominal pain - Primary    Better on Crestor!      B12 deficiency    On B12      Diabetes mellitus type 2 in obese [facility])    F/u w/Dr Dwyane Dee On Jardiance      Dyslipidemia    Started on Crestor      Osteoporosis    Prolia  Potential benefits of a long term Prolia use as well as potential risks  and complications were explained to the patient and were aknowledged.         No orders of the defined types were placed in this encounter.     Follow-up: Return in about 3 months (around 11/09/2022) for a follow-up visit.  Walker Kehr, MD

## 2022-08-11 MED ORDER — PROLIA 60 MG/ML ~~LOC~~ SOSY: 60.0000 mg | PREFILLED_SYRINGE | SUBCUTANEOUS | 0 refills | Status: DC

## 2022-08-11 NOTE — Telephone Encounter (Signed)
Patients husband called and and requested a call back about the prolia shot at [phone]

## 2022-08-11 NOTE — Telephone Encounter (Signed)
Called husband he state he talk w/ [employer] concerning the prolia shot. They told him nthat the office must By Bill through Medicare then bill [employer] as sec.[name] She shouldn't have no copay. Inform pt will send info to the once that does insurance. Once I hear back from them I will call him to make prolia appt.Marland KitchenJohny Chess

## 2022-08-11 NOTE — Telephone Encounter (Signed)
Rec'd PA form # [id number] completed and faxed back to [facility]. [facility] for pro[facility]. Med has to come from them.Marland KitchenJohny Chess

## 2022-08-12 NOTE — Telephone Encounter (Signed)
Called [facility] spoke w/ rep Marjory Lies. Inform him we received fax that pt was approved for prolia effective 08/11/22 through 08/12/23. Which he had approval as well. He inform that after speaking w/ the resolution team the pt does have a copy pay of $504.00 and she need to call [phone] before they would ship the med out. Called pt husband spoke w/ him gave him the information I was given and the # to call [facility] [phone].Marland KitchenJohny Chess

## 2022-08-15 NOTE — Telephone Encounter (Signed)
Patient's husband Dominica Severin, on [place]) called and states he has [facility] and would like to speak back with Lucy. Call back number is [phone]

## 2022-08-15 NOTE — Telephone Encounter (Addendum)
Husband call back he states he spoke w/ [employer], The rep ran the claim ok for prolia. She called [facility] they will mail prolia by Dec 19th. Inform husband will keep eye out soon as we received medicine I wil call him to put her on schedule.Marland KitchenJohny Chess

## 2022-08-15 NOTE — Telephone Encounter (Signed)
Called husband back he states he spoke w/ [facility] and to bill for injection under the [employer] through [employer] and then what's left billed secondary [place]. Inform husband I'm not sure w/the billing, but I will reach out to [place] and also Arrie Aran is coding so pt can get injection...[name]

## 2022-08-18 ENCOUNTER — Other Ambulatory Visit: Payer: Self-pay | Admitting: Endocrinology

## 2022-08-18 NOTE — Telephone Encounter (Signed)
Forwarding to RX Prior Auth Team 

## 2022-08-19 NOTE — Telephone Encounter (Signed)
Per other note:

## 2022-08-23 NOTE — Telephone Encounter (Signed)
Pt prolia FINALLY came in today. Called pt/husband. She has already paid for injection. Made her appt for 08/25/22 @ 10:40.Marland KitchenJohny Chess

## 2022-08-25 ENCOUNTER — Ambulatory Visit (INDEPENDENT_AMBULATORY_CARE_PROVIDER_SITE_OTHER): Payer: Medicare Other

## 2022-08-25 ENCOUNTER — Telehealth: Payer: Self-pay

## 2022-08-25 DIAGNOSIS — M810 Age-related osteoporosis without current pathological fracture: Secondary | ICD-10-CM

## 2022-08-25 DIAGNOSIS — H903 Sensorineural hearing loss, bilateral: Secondary | ICD-10-CM

## 2022-08-25 MED ORDER — DENOSUMAB 60 MG/ML ~~LOC~~ SOSY
60.0000 mg | PREFILLED_SYRINGE | Freq: Once | SUBCUTANEOUS | Status: DC
  Administered 2022-08-25: 60 mg via SUBCUTANEOUS

## 2022-08-25 MED ORDER — DENOSUMAB 60 MG/ML ~~LOC~~ SOSY
60.0000 mg | PREFILLED_SYRINGE | Freq: Once | SUBCUTANEOUS | Status: AC
  Administered 2022-08-25: 60 mg via SUBCUTANEOUS

## 2022-08-25 NOTE — Telephone Encounter (Signed)
Good Morning,  I mistakenly signed off on pts PROLIA nurse visit and did not select pt supplied and could not delete this order out her chart as I have redid the order and need order number [id number] deleted as this is not the correct one.  The one to keep and that is correct is order number [id number].

## 2022-08-25 NOTE — Progress Notes (Addendum)
Pt was given Prolia injection w/o any complications.  Medical screening examination/treatment/procedure(s) were performed by non-physician practitioner and as supervising physician I was immediately available for consultation/collaboration.  I agree with above. Lew Dawes, MD

## 2022-09-01 NOTE — Addendum Note (Signed)
Addended by: Marijean Heath R on: 09/01/2022 02:00 PM   Modules accepted: Orders

## 2022-09-01 NOTE — Telephone Encounter (Signed)
Good Afternoon,  I was able to fix the charges and the order for the Prolia that was wrongfully entered into pts chart. I just wanted to update you so you can fix the billing side of it or the charges.

## 2022-09-27 NOTE — Assessment & Plan Note (Signed)
77 yo w/remote h/o a stage IIIC Fallopian tube cancer Suspect MSK pain generator.  Negative imaging, normal exam   >annual follow-up appropriate with a generalist gynecologist

## 2022-09-27 NOTE — Progress Notes (Unsigned)
Follow Up Note: Gyn-Onc  [name] 77 y.o. female  CC: She presents for a follow-up visit  HPI: She has a remote h/o a stage IIIC Fallopian tube cancer Oncology History  Fallopian tube cancer, carcinoma ([place])  10/07/2008 Cancer Staging   Cancer Staging Fallopian tube cancer, carcinoma ([place]) Staging form: Fallopian Tube, AJCC 7th Edition - Clinical: IIIC - Unsigned - Pathologic: IIIC - Unsigned She was optimally debulked with minimal disease on peritoneal surfaces   04/14/2009 PET scan   NED   02/03/2014 Initial Diagnosis   Fallopian tube cancer, carcinoma ([place])    - 02/03/2009 Chemotherapy   . She received 6 cycles of carboplatin and Taxol chemotherapy completed in June 2010     Tumor Marker   Patient's tumor was tested for the following markers: CA-125 Results of the tumor marker test revealed : 7 mo ago  (04/22/19) 2 yr ago  (11/20/17) 3 yr ago  (11/16/16) 3 yr ago  (03/21/16)  CA 125 <35 U/mL 10  9 CM      Interval History:  History remarkable for IBS, OAB and newly diagnosed DJD per the pt.  She has worsening abdominal/back pain. She recently presented to the ED in 6/23 w/abdominal/low back pain. CTAP was neg. A CA 123 in 1/23 was 7.  DJD.  The pain is mostly in the RUQ/flank.  Occasionally the pain is left-sided.  The pain is worse when riding in car, turning in bed.  She denies increasing abdominal girth or unexplained weight loss.    Review of Systems  Review of Systems  Constitutional:  Negative for malaise/fatigue.  Gastrointestinal:  Negative for abdominal pain and diarrhea.  Genitourinary:  Positive for frequency and urgency.    Current Meds:  Outpatient Encounter Medications as of 09/28/2022  Medication Sig   amLODipine (NORVASC) 5 MG tablet Take 1 tablet (5 mg total) by mouth daily.   aspirin 81 MG EC tablet Take 81 mg by mouth daily.   bimatoprost (LUMIGAN) 0.01 % SOLN Place  1 drop into both eyes at bedtime.   Blood Glucose Monitoring Suppl (ONETOUCH VERIO FLEX SYSTEM) w/Device KIT 1 each by Does not apply route in the morning, at noon, and at bedtime.    Cholecalciferol (VITAMIN D3) 1.25 MG (50000 UT) CAPS TAKE ONE CAPSULE BY MOUTH EVERY 30 DAYS   [facility] DIGESTIVE PROBIOTIC 250 MG capsule TAKE 1 CAPSULE BY MOUTH 2 TIMES DAILY.   Cyanocobalamin (NASCOBAL) 500 MCG/0.1ML SOLN USE 1 SPRAY IN 1 NOSTRIL  ONCE A WEEK   DENTA 5000 PLUS 1.1 % CREA dental cream Take by mouth as directed.   diphenoxylate-atropine (LOMOTIL) 2.5-0.025 MG tablet TAKE 1 TABLET BY MOUTH FOUR TIMES A DAY AS NEEDED FOR DIARRHEA OR LOOSE STOOLS   furosemide (LASIX) 20 MG tablet TAKE 0.5-1 TABLETS (10-20 MG TOTAL) BY MOUTH DAILY AS NEEDED FOR EDEMA (FOR LEG SWELLING).   JARDIANCE 10 MG TABS tablet TAKE 1 TABLET (10 MG TOTAL) BY MOUTH DAILY WITH BREAKFAST.   KLOR-CON M20 20 MEQ tablet TAKE 1 TABLET BY MOUTH EVERY DAY   Lancets (ONETOUCH DELICA PLUS [id number]) MISC USE AS DIRECTED TO CHECK BLOOD SUGAR IN THE MORNING, AT NOON, AND AT BEDTIME   ONETOUCH VERIO test strip USE ONETOUCH VERIO TEST STRIPS AS INSTRUCTED TO CHECK BLOOD SUGAR THREE TIMES DAILY.   PROLIA 60 MG/ML SOSY injection Inject 60 mg into the skin every 6 (six) months.   propranolol ER (INDERAL LA) 60 MG 24 hr capsule TAKE 1 CAPSULE (  60 MG TOTAL) BY MOUTH 2 (TWO) TIMES DAILY.   RABEprazole (ACIPHEX) 20 MG tablet Take 1 tablet (20 mg total) by mouth daily.   rosuvastatin (CRESTOR) 5 MG tablet TAKE 1 TABLET (5 MG TOTAL) BY MOUTH DAILY.   timolol (TIMOPTIC) 0.5 % ophthalmic solution Place 1 drop into both eyes 2 (two) times daily.   triamcinolone cream (KENALOG) 0.1 % APPLY TO AFFECTED AREA TWICE A DAY AS NEEDED   No facility-administered encounter medications on file as of 09/28/2022.    Allergy:  Allergies  Allergen Reactions   Boniva [Ibandronate Sodium]     cramp   Calcium Channel Blockers     Upset stomach   Chlorthalidone      Diarrhea per pt   Compazine     Daughter reacts to compazine/pt does not want to take   Lyrica [Pregabalin]     Dizzy    Risedronate Sodium Other (See Comments)    Upset stomach   Tape Rash    redness    Social Hx:   Social History   Socioeconomic History   Marital status: Married    Spouse name: Dominica Severin   Number of children: 2   Years of education: BS   Highest education level: Not on file  Occupational History   Occupation: Scientist, 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  Tobacco Use   Smoking status: Never   Smokeless tobacco: Never  Vaping Use   Vaping Use: Never used  Substance and Sexual Activity   Alcohol use: No    Alcohol/week: 0.0 standard drinks of alcohol   Drug use: No   Sexual activity: Not on file  Other Topics Concern   Not on file  Social History Narrative   Lives with spouse   Caffeine use: cokes      Right handed    Social Determinants of Health   Financial Resource Strain: Low Risk  (12/31/2021)   Overall Financial Resource Strain (CARDIA)    Difficulty of Paying Living Expenses: Not hard at all  Food Insecurity: No Food Insecurity (12/31/2021)   Hunger Vital Sign    Worried About Running Out of Food in the Last Year: Never true    Ran Out of Food in the Last Year: Never true  Transportation Needs: No Transportation Needs (12/31/2021)   PRAPARE - Hydrologist (Medical): No    Lack of Transportation (Non-Medical): No  Physical Activity: Inactive (12/31/2021)   Exercise Vital Sign    Days of Exercise per Week: 0 days    Minutes of Exercise per Session: 0 min  Stress: No Stress Concern Present (12/31/2021)   [place]    Feeling of Stress : Not at all  Social Connections: Moderately Integrated (12/31/2021)   Social Connection and Isolation Panel [NHANES]    Frequency of Communication with Friends and Family: Twice a week    Frequency of Social Gatherings with  Friends and Family: Twice a week    Attends Religious Services: More than 4 times per year    Active Member of [employer] or Organizations: No    Attends Archivist Meetings: Never    Marital Status: Married  Human resources officer Violence: Not At Risk (12/31/2021)   Humiliation, Afraid, Rape, and Kick questionnaire    Fear of Current or Ex-Partner: No    Emotionally Abused: No    Physically Abused: No    Sexually Abused: No    Past Surgical  Hx:  Past Surgical History:  Procedure Laterality Date   ABDOMINAL HYSTERECTOMY     APPENDECTOMY     BLADDER SURGERY  04/09/2020   tack and sling   CHOLECYSTECTOMY N/A 05/15/2017   Procedure: LAPAROSCOPIC CHOLECYSTECTOMY WITH INTRAOPERATIVE CHOLANGIOGRAM AND LYSIS OF ADHESIONS;  Surgeon: Fanny Skates, MD;  Location: [facility];  Service: General;  Laterality: N/A;   COLONOSCOPY  2018   Dr.Perry   ELBOW FRACTURE SURGERY     age 89- left elbow   HEMORRHOID SURGERY  2001   Ovarian Cancer Debulking  09/2008   POLYPECTOMY      Past Medical Hx:  Past Medical History:  Diagnosis Date   Adenomatous colon polyp 04/08/2011   Anemia    Anxiety    Cataract    Diabetes mellitus without complication ([place])    "pre-diabetic" per pt   Essential hypertension 08/10/2007   Chronic  On Catapress (per Dr Ubaldo Glassing) - increase to bid - d/c Hydralazine prn per Dr Caryl Comes d/c 3/19: Increase Inderal LA to bid   GERD (gastroesophageal reflux disease)    Glaucoma (increased eye pressure)    Heart murmur    HTN (hypertension)    IBS (irritable bowel syndrome)    LBP (low back pain)    Menopause    Osteoporosis    Ovarian cancer ([place]) 09/2008   Dr Gwyneth Revels   Pancreatic cyst    Vitamin B12 deficiency    Vitamin D deficiency     Family Hx:  Family History  Problem Relation Age of Onset   Alzheimer's disease Mother 41   Other Mother 86       TAH for excessive bleeding   Lung cancer Father        smoker; metastasis to stomach and other areas   Heart attack  Maternal Uncle    Heart disease Maternal Uncle    Other Paternal Aunt        stomach issues   Heart Problems Paternal Uncle    Other Maternal Grandmother        stomach issues; +hysterectomy   Heart attack Maternal Grandfather    Heart disease Maternal Grandfather    Infertility Daughter    Stomach cancer Cousin        dx. mid-60s   Other Cousin        stomach issues   Leukemia Cousin 56   Stomach cancer Other    Cancer Cousin        unknown type   Other Cousin        stomach issues   Heart Problems Maternal Uncle    Diabetes Paternal Aunt    Heart Problems Paternal Uncle    Emphysema Paternal Uncle        work exposure   Breast cancer Cousin        dx. late 60s-early 70s   Colon cancer Neg Hx    Rectal cancer Neg Hx    Esophageal cancer Neg Hx     Vitals:  There were no vitals taken for this visit.     Physical Exam:  Physical Exam Constitutional:      General: She is not in acute distress. Abdominal:     General: There is no distension.     Palpations: Abdomen is soft. There is no mass.     Tenderness: There is no abdominal tenderness.  Genitourinary:    Vagina: Normal.     Comments: Angiokeratomas, b/l; short vagina, no palpable abnormality.  Bladder well supported.  Neurological:     Mental Status: She is alert and oriented to person, place, and time.     Assessment/Plan:   No problem-specific Assessment & Plan notes found for this encounter.     I personally spent 30 minutes face-to-face and non-face-to-face in the care of this patient, which includes all pre, intra, and post visit time on the date of service.   Lahoma Crocker, MD 09/27/2022, 2:46 PM

## 2022-09-28 ENCOUNTER — Inpatient Hospital Stay: Payer: Medicare Other | Attending: Obstetrics & Gynecology | Admitting: Obstetrics & Gynecology

## 2022-09-28 ENCOUNTER — Encounter: Payer: Self-pay | Admitting: Obstetrics & Gynecology

## 2022-09-28 ENCOUNTER — Other Ambulatory Visit: Payer: Self-pay

## 2022-09-28 VITALS — BP 153/51 | HR 66 | Temp 98.5°F | Resp 20 | Wt 172.6 lb

## 2022-09-28 DIAGNOSIS — Z8544 Personal history of malignant neoplasm of other female genital organs: Secondary | ICD-10-CM | POA: Insufficient documentation

## 2022-09-28 DIAGNOSIS — Z9221 Personal history of antineoplastic chemotherapy: Secondary | ICD-10-CM | POA: Diagnosis not present

## 2022-09-28 DIAGNOSIS — C5700 Malignant neoplasm of unspecified fallopian tube: Secondary | ICD-10-CM

## 2022-09-28 NOTE — Patient Instructions (Addendum)
Return in 1 year ?

## 2022-09-30 ENCOUNTER — Other Ambulatory Visit: Payer: Self-pay | Admitting: Endocrinology

## 2022-10-10 ENCOUNTER — Other Ambulatory Visit: Payer: Self-pay | Admitting: Internal Medicine

## 2022-10-10 ENCOUNTER — Other Ambulatory Visit: Payer: Self-pay | Admitting: Endocrinology

## 2022-10-17 DIAGNOSIS — N3281 Overactive bladder: Secondary | ICD-10-CM | POA: Diagnosis not present

## 2022-10-17 DIAGNOSIS — N3941 Urge incontinence: Secondary | ICD-10-CM | POA: Diagnosis not present

## 2022-10-17 DIAGNOSIS — Z960 Presence of urogenital implants: Secondary | ICD-10-CM | POA: Diagnosis not present

## 2022-10-17 DIAGNOSIS — N1330 Unspecified hydronephrosis: Secondary | ICD-10-CM | POA: Diagnosis not present

## 2022-10-17 DIAGNOSIS — N200 Calculus of kidney: Secondary | ICD-10-CM | POA: Diagnosis not present

## 2022-10-17 DIAGNOSIS — Z87448 Personal history of other diseases of urinary system: Secondary | ICD-10-CM | POA: Diagnosis not present

## 2022-10-17 DIAGNOSIS — Z87442 Personal history of urinary calculi: Secondary | ICD-10-CM | POA: Diagnosis not present

## 2022-10-21 ENCOUNTER — Other Ambulatory Visit: Payer: Self-pay | Admitting: Internal Medicine

## 2022-11-09 ENCOUNTER — Ambulatory Visit: Payer: Medicare Other | Admitting: Internal Medicine

## 2022-11-15 ENCOUNTER — Ambulatory Visit (INDEPENDENT_AMBULATORY_CARE_PROVIDER_SITE_OTHER): Payer: Medicare Other | Admitting: Internal Medicine

## 2022-11-15 ENCOUNTER — Encounter: Payer: Self-pay | Admitting: Internal Medicine

## 2022-11-15 VITALS — BP 120/80 | HR 62 | Temp 98.6°F | Ht 64.0 in | Wt 171.0 lb

## 2022-11-15 DIAGNOSIS — E876 Hypokalemia: Secondary | ICD-10-CM | POA: Diagnosis not present

## 2022-11-15 DIAGNOSIS — C5700 Malignant neoplasm of unspecified fallopian tube: Secondary | ICD-10-CM | POA: Diagnosis not present

## 2022-11-15 DIAGNOSIS — E1169 Type 2 diabetes mellitus with other specified complication: Secondary | ICD-10-CM

## 2022-11-15 DIAGNOSIS — E669 Obesity, unspecified: Secondary | ICD-10-CM | POA: Diagnosis not present

## 2022-11-15 DIAGNOSIS — Z7984 Long term (current) use of oral hypoglycemic drugs: Secondary | ICD-10-CM

## 2022-11-15 DIAGNOSIS — I10 Essential (primary) hypertension: Secondary | ICD-10-CM

## 2022-11-15 DIAGNOSIS — R531 Weakness: Secondary | ICD-10-CM | POA: Diagnosis not present

## 2022-11-15 DIAGNOSIS — E538 Deficiency of other specified B group vitamins: Secondary | ICD-10-CM

## 2022-11-15 DIAGNOSIS — R232 Flushing: Secondary | ICD-10-CM | POA: Diagnosis not present

## 2022-11-15 LAB — CORTISOL: Cortisol, Plasma: 10.2 ug/dL

## 2022-11-15 LAB — COMPREHENSIVE METABOLIC PANEL
ALT: 20 U/L (ref 0–35)
AST: 24 U/L (ref 0–37)
Albumin: 4.4 g/dL (ref 3.5–5.2)
Alkaline Phosphatase: 55 U/L (ref 39–117)
BUN: 29 mg/dL — ABNORMAL HIGH (ref 6–23)
CO2: 27 mEq/L (ref 19–32)
Calcium: 10 mg/dL (ref 8.4–10.5)
Chloride: 104 mEq/L (ref 96–112)
Creatinine, Ser: 1.14 mg/dL (ref 0.40–1.20)
GFR: 46.62 mL/min — ABNORMAL LOW (ref >60.00)
Glucose, Bld: 112 mg/dL — ABNORMAL HIGH (ref 70–99)
Potassium: 3.9 mEq/L (ref 3.5–5.1)
Sodium: 140 mEq/L (ref 135–145)
Total Bilirubin: 0.5 mg/dL (ref 0.2–1.2)
Total Protein: 7.6 g/dL (ref 6.0–8.3)

## 2022-11-15 LAB — TSH: TSH: 1.61 u[IU]/mL (ref 0.35–5.50)

## 2022-11-15 LAB — T4, FREE: Free T4: 1 ng/dL (ref 0.60–1.60)

## 2022-11-15 LAB — HEMOGLOBIN A1C: Hgb A1c MFr Bld: 6.1 % (ref 4.6–6.5)

## 2022-11-15 NOTE — Assessment & Plan Note (Signed)
Low normal BP now Hold Jardiance x 1 - 2 weeks

## 2022-11-15 NOTE — Progress Notes (Signed)
Subjective:  Patient ID: [name], female    DOB: [birth year]  Age: 77 y.o. MRN: [id number]  CC: Follow-up ( F/U, spells of being light headed, pains around waistline after meals  )   HPI  KIDA DIGIULIO presents for follow - up spells of being light headed. C/o pains around waistline after meals, 1-2 hrs after meals x 15-20 min; drinking water makes it worse. Renal US was OK.   Outpatient Medications Prior to Visit  Medication Sig Dispense Refill   amLODipine (NORVASC) 5 MG tablet Take 1 tablet (5 mg total) by mouth daily. 90 tablet 3   aspirin 81 MG EC tablet Take 81 mg by mouth daily.     bimatoprost (LUMIGAN) 0.01 % SOLN Place 1 drop into both eyes at bedtime.     Blood Glucose Monitoring Suppl (ONETOUCH VERIO FLEX SYSTEM) w/Device KIT 1 each by Does not apply route in the morning, at noon, and at bedtime.      [employer]S DIGESTIVE PROBIOTIC 250 MG capsule TAKE 1 CAPSULE BY MOUTH 2 TIMES DAILY. 50 capsule 2   DENTA 5000 PLUS 1.1 % CREA dental cream Take by mouth as directed.     furosemide (LASIX) 20 MG tablet TAKE 0.5-1 TABLETS (10-20 MG TOTAL) BY MOUTH DAILY AS NEEDED FOR EDEMA (FOR LEG SWELLING). 90 tablet 1   JARDIANCE 10 MG TABS tablet TAKE 1 TABLET (10 MG TOTAL) BY MOUTH DAILY WITH BREAKFAST. 90 tablet 2   KLOR-CON M20 20 MEQ tablet TAKE 1 TABLET BY MOUTH EVERY DAY 90 tablet 3   Lancets (ONETOUCH DELICA PLUS LANCET30G) MISC USE AS DIRECTED TO CHECK BLOOD SUGAR IN THE MORNING, AT NOON, AND AT BEDTIME 300 each 2   ONETOUCH VERIO test strip USE ONETOUCH VERIO TEST STRIPS AS INSTRUCTED TO CHECK BLOOD SUGAR THREE TIMES DAILY. 300 strip 1   oxybutynin (DITROPAN) 5 MG tablet Take by mouth.     PROLIA 60 MG/ML SOSY injection Inject 60 mg into the skin every 6 (six) months. 60 mL 0   propranolol ER (INDERAL LA) 60 MG 24 hr capsule TAKE 1 CAPSULE (60 MG TOTAL) BY MOUTH 2 (TWO) TIMES DAILY. 180 capsule 3   RABEprazole (ACIPHEX) 20 MG tablet TAKE 1 TABLET BY MOUTH EVERY DAY 90  tablet 3   rosuvastatin (CRESTOR) 5 MG tablet TAKE 1 TABLET (5 MG TOTAL) BY MOUTH DAILY. 90 tablet 1   timolol (TIMOPTIC) 0.5 % ophthalmic solution Place 1 drop into both eyes 2 (two) times daily.     triamcinolone cream (KENALOG) 0.1 % APPLY TO AFFECTED AREA TWICE A DAY AS NEEDED     VITAMIN D3 1.25 MG (50000 UT) capsule TAKE ONE CAPSULE BY MOUTH EVERY 30 DAYS 9 capsule 1   Cyanocobalamin (NASCOBAL) 500 MCG/0.1ML SOLN USE 1 SPRAY IN 1 NOSTRIL  ONCE A WEEK 3 each 3   diphenoxylate-atropine (LOMOTIL) 2.5-0.025 MG tablet TAKE 1 TABLET BY MOUTH FOUR TIMES A DAY AS NEEDED FOR DIARRHEA OR LOOSE STOOLS 60 tablet 3   No facility-administered medications prior to visit.    ROS: Review of Systems  Constitutional:  Positive for fatigue. Negative for activity change, appetite change, chills and unexpected weight change.  HENT:  Negative for congestion, mouth sores and sinus pressure.   Eyes:  Negative for visual disturbance.  Respiratory:  Negative for cough and chest tightness.   Gastrointestinal:  Negative for abdominal pain and nausea.  Genitourinary:  Negative for difficulty urinating, frequency and vaginal pain.  Musculoskeletal:  Negative for back pain and gait problem.  Skin:  Negative for pallor and rash.  Neurological:  Positive for weakness. Negative for dizziness, tremors, numbness and headaches.  Psychiatric/Behavioral:  Negative for confusion and sleep disturbance. The patient is nervous/anxious.     Objective:  BP 120/80 (Patient Position: Standing)   Pulse 62   Temp 98.6 F (37 C) (Oral)   Ht 5\' 4"  (1.626 m)   Wt 171 lb (77.6 kg)   SpO2 95%   BMI 29.35 kg/m   BP Readings from Last 3 Encounters:  12/28/22 126/62  11/28/22 132/84  11/15/22 120/80    Wt Readings from Last 3 Encounters:  12/28/22 172 lb (78 kg)  12/27/22 170 lb (77.1 kg)  11/28/22 173 lb 6.4 oz (78.7 kg)    Physical Exam Constitutional:      General: She is not in acute distress.    Appearance: She  is well-developed. She is obese.  HENT:     Head: Normocephalic.     Right Ear: External ear normal.     Left Ear: External ear normal.     Nose: Nose normal.  Eyes:     General:        Right eye: No discharge.        Left eye: No discharge.     Conjunctiva/sclera: Conjunctivae normal.     Pupils: Pupils are equal, round, and reactive to light.  Neck:     Thyroid: No thyromegaly.     Vascular: No JVD.     Trachea: No tracheal deviation.  Cardiovascular:     Rate and Rhythm: Normal rate and regular rhythm.     Heart sounds: Normal heart sounds.  Pulmonary:     Effort: No respiratory distress.     Breath sounds: No stridor. No wheezing.  Abdominal:     General: Bowel sounds are normal. There is no distension.     Palpations: Abdomen is soft. There is no mass.     Tenderness: There is no abdominal tenderness. There is no guarding or rebound.  Musculoskeletal:        General: No tenderness.     Cervical back: Normal range of motion and neck supple. No rigidity.  Lymphadenopathy:     Cervical: No cervical adenopathy.  Skin:    Findings: No erythema or rash.  Neurological:     Mental Status: She is oriented to person, place, and time.     Cranial Nerves: No cranial nerve deficit.     Motor: No abnormal muscle tone.     Coordination: Coordination normal.     Gait: Gait abnormal.     Deep Tendon Reflexes: Reflexes normal.  Psychiatric:        Behavior: Behavior normal.        Thought Content: Thought content normal.        Judgment: Judgment normal.     Lab Results  Component Value Date   WBC 6.4 11/15/2022   HGB 13.1 11/15/2022   HCT 38.6 11/15/2022   PLT 198.0 11/15/2022   GLUCOSE 112 (H) 11/15/2022   CHOL 115 11/28/2022   TRIG 149.0 11/28/2022   HDL 59.60 11/28/2022   LDLDIRECT 122.0 05/24/2022   LDLCALC 26 11/28/2022   ALT 20 11/15/2022   AST 24 11/15/2022   NA 140 11/15/2022   K 3.9 11/15/2022   CL 104 11/15/2022   CREATININE 1.14 11/15/2022   BUN 29 (H)  11/15/2022   CO2 27 11/15/2022   TSH 1.61  11/15/2022   HGBA1C 6.1 11/15/2022   MICROALBUR 9.0 (H) 11/24/2022    MM 3D SCREEN BREAST BILATERAL  Result Date: 02/24/2022 CLINICAL DATA:  Screening. EXAM: DIGITAL SCREENING BILATERAL MAMMOGRAM WITH TOMOSYNTHESIS AND CAD TECHNIQUE: Bilateral screening digital craniocaudal and mediolateral oblique mammograms were obtained. Bilateral screening digital breast tomosynthesis was performed. The images were evaluated with computer-aided detection. COMPARISON:  Previous exam(s). ACR Breast Density Category c: The breast tissue is heterogeneously dense, which may obscure small masses. FINDINGS: There are no findings suspicious for malignancy. IMPRESSION: No mammographic evidence of malignancy. A result letter of this screening mammogram will be mailed directly to the patient. RECOMMENDATION: Screening mammogram in one year. (Code:SM-B-01Y) BI-RADS CATEGORY  1: Negative. Electronically Signed   By: Harmon Pier M.D.   On: 02/24/2022 15:36    Assessment & Plan:   Problem List Items Addressed This Visit       Cardiovascular and Mediastinum   Hot flashes    Low normal BP now Hold Jardiance x 1 - 2 weeks      Relevant Orders   Cortisol (Completed)   CBC with Differential/Platelet (Completed)   Comprehensive metabolic panel (Completed)   T4, free (Completed)   TSH (Completed)   Vitamin B12 (Completed)   Urinalysis (Completed)   Hemoglobin A1c (Completed)   HTN (hypertension)    Low normal BP now Hold Jardiance x 1 - 2 weeks        Endocrine   Diabetes mellitus type 2 in obese   Relevant Orders   Hemoglobin A1c (Completed)     Genitourinary   Fallopian tube cancer, carcinoma   Relevant Orders   CA 125 (Completed)     Other   B12 deficiency - Primary    On B12      Relevant Orders   Vitamin B12 (Completed)   Hypokalemia   Other Visit Diagnoses     Spell of generalized weakness       Relevant Orders   Cortisol (Completed)   CBC  with Differential/Platelet (Completed)   Comprehensive metabolic panel (Completed)   T4, free (Completed)   TSH (Completed)   Vitamin B12 (Completed)   Urinalysis (Completed)   Hemoglobin A1c (Completed)         No orders of the defined types were placed in this encounter.     Follow-up: Return in about 6 weeks (around 12/27/2022) for a follow-up visit.  Sonda Primes, MD

## 2022-11-15 NOTE — Patient Instructions (Signed)
Hydrate better Hold Jardiance x 1 - 2 weeks

## 2022-11-15 NOTE — Assessment & Plan Note (Signed)
On B12 

## 2022-11-16 LAB — CBC WITH DIFFERENTIAL/PLATELET
Basophils Absolute: 0 10*3/uL (ref 0.0–0.1)
Basophils Relative: 0.8 % (ref 0.0–3.0)
Eosinophils Absolute: 0.1 10*3/uL (ref 0.0–0.7)
Eosinophils Relative: 1.9 % (ref 0.0–5.0)
HCT: 38.6 % (ref 36.0–46.0)
Hemoglobin: 13.1 g/dL (ref 12.0–15.0)
Lymphocytes Relative: 17.8 % (ref 12.0–46.0)
Lymphs Abs: 1.1 10*3/uL (ref 0.7–4.0)
MCHC: 33.9 g/dL (ref 30.0–36.0)
MCV: 86.7 fl (ref 78.0–100.0)
Monocytes Absolute: 0.5 10*3/uL (ref 0.1–1.0)
Monocytes Relative: 8 % (ref 3.0–12.0)
Neutro Abs: 4.6 10*3/uL (ref 1.4–7.7)
Neutrophils Relative %: 71.5 % (ref 43.0–77.0)
Platelets: 198 10*3/uL (ref 150.0–400.0)
RBC: 4.45 Mil/uL (ref 3.87–5.11)
RDW: 13.9 % (ref 11.5–15.5)
WBC: 6.4 10*3/uL (ref 4.0–10.5)

## 2022-11-16 LAB — CA 125: CA 125: 8 U/mL (ref <35)

## 2022-11-16 LAB — URINALYSIS
Bilirubin Urine: NEGATIVE
Hgb urine dipstick: NEGATIVE
Ketones, ur: NEGATIVE
Leukocytes,Ua: NEGATIVE
Nitrite: NEGATIVE
Specific Gravity, Urine: 1.015 (ref 1.000–1.030)
Total Protein, Urine: NEGATIVE
Urine Glucose: >=1000 — AB
Urobilinogen, UA: 0.2 (ref 0.0–1.0)
pH: 5.5 (ref 5.0–8.0)

## 2022-11-16 LAB — VITAMIN B12: Vitamin B-12: 604 pg/mL (ref 211–911)

## 2022-11-24 ENCOUNTER — Other Ambulatory Visit (INDEPENDENT_AMBULATORY_CARE_PROVIDER_SITE_OTHER): Payer: Medicare Other

## 2022-11-24 DIAGNOSIS — E1169 Type 2 diabetes mellitus with other specified complication: Secondary | ICD-10-CM

## 2022-11-24 DIAGNOSIS — E669 Obesity, unspecified: Secondary | ICD-10-CM | POA: Diagnosis not present

## 2022-11-24 LAB — MICROALBUMIN / CREATININE URINE RATIO
Creatinine,U: 189.5 mg/dL
Microalb Creat Ratio: 4.8 mg/g (ref 0.0–30.0)
Microalb, Ur: 9 mg/dL — ABNORMAL HIGH (ref 0.0–1.9)

## 2022-11-28 ENCOUNTER — Ambulatory Visit (INDEPENDENT_AMBULATORY_CARE_PROVIDER_SITE_OTHER): Payer: Medicare Other | Admitting: Endocrinology

## 2022-11-28 ENCOUNTER — Encounter: Payer: Self-pay | Admitting: Endocrinology

## 2022-11-28 VITALS — BP 132/84 | Ht 64.0 in | Wt 173.4 lb

## 2022-11-28 DIAGNOSIS — E1169 Type 2 diabetes mellitus with other specified complication: Secondary | ICD-10-CM

## 2022-11-28 DIAGNOSIS — E161 Other hypoglycemia: Secondary | ICD-10-CM | POA: Diagnosis not present

## 2022-11-28 DIAGNOSIS — E785 Hyperlipidemia, unspecified: Secondary | ICD-10-CM | POA: Diagnosis not present

## 2022-11-28 DIAGNOSIS — M5414 Radiculopathy, thoracic region: Secondary | ICD-10-CM

## 2022-11-28 DIAGNOSIS — E1140 Type 2 diabetes mellitus with diabetic neuropathy, unspecified: Secondary | ICD-10-CM | POA: Diagnosis not present

## 2022-11-28 DIAGNOSIS — E669 Obesity, unspecified: Secondary | ICD-10-CM | POA: Diagnosis not present

## 2022-11-28 DIAGNOSIS — I10 Essential (primary) hypertension: Secondary | ICD-10-CM | POA: Diagnosis not present

## 2022-11-28 LAB — LIPID PANEL
Cholesterol: 115 mg/dL (ref 0–200)
HDL: 59.6 mg/dL (ref >39.00)
LDL Cholesterol: 26 mg/dL (ref 0–99)
NonHDL: 55.39
Total CHOL/HDL Ratio: 2
Triglycerides: 149 mg/dL (ref 0.0–149.0)
VLDL: 29.8 mg/dL (ref 0.0–40.0)

## 2022-11-28 MED ORDER — GABAPENTIN 100 MG PO CAPS: 100.0000 mg | ORAL_CAPSULE | Freq: Three times a day (TID) | ORAL | 3 refills | Status: DC

## 2022-11-28 NOTE — Progress Notes (Signed)
Patient ID: [name], female   DOB: 1946/01/19, 77 y.o.   MRN: [id number]            Reason for Appointment: Follow up  Primary care physician: [name]  History of Present Illness:      Previously had A1c levels in the prediabetic range and had been at baseline upper normal around 6.0  She was evaluated with a glucose tolerance test in 10/2015 which showed the following: Fasting glucose 122, one-hour glucose 202 hour glucose 147  She was referred to the dietitian for meal planning and advised weight loss  Recent history:  Her A1c has been ranging between 5.7 -6.4 generally and now 6.1   Current medications for diabetes: Jardiance 10 mg daily, started in 11/20  With her having high blood sugars over 125 fasting she was diagnosed with overt diabetes Also has history of background retinopathy   She has been treated with Jardiance 10 mg, intolerant to metformin She is now complaining of episodes of feeling little lightheaded along with nonspecific abdominal discomfort and malaise which sometimes will get better with eating a snack or sometimes just from resting This appears to be happening late morning and occasionally early afternoon She did check her blood pressure during 1 of these episodes and it is not low but has not checked her blood sugar at that time Her blood sugars tend to be the lowest before lunch including 2 readings below 65 in the last 30 days She is checking her sugars at least 4 times a day now Her PCP told her about 2 weeks ago to stop her [name] but she continues to have these symptoms She is not always having much protein at breakfast and frequently will have oatmeal and toast Her weight is about the same  Last nutritional consultation was in 04/2018  Recent blood sugars from One Touch Verio meter download  PRE-MEAL Fasting Lunch Dinner Bedtime Overall  Glucose range: 93-111 69-161   69-168  Averages: 100 108 109  115   POST-MEAL PC Breakfast  PC Lunch PC Dinner  Glucose range: 94-157  106-162  Averages: 110  132   Previously:  PRE-MEAL Fasting Lunch Dinner Bedtime Overall  Glucose range: 98-125  95 148   Mean/median:        POST-MEAL PC Breakfast PC Lunch PC Dinner  Glucose range: 154, 88 145, 122, 130   Mean/median:       Weight history:  Wt Readings from Last 3 Encounters:  11/28/22 173 lb 6.4 oz (78.7 kg)  11/15/22 171 lb (77.6 kg)  09/28/22 172 lb 9.6 oz (78.3 kg)    Glycemic levels:   Lab Results  Component Value Date   HGBA1C 6.1 11/15/2022   HGBA1C 6.0 05/24/2022   HGBA1C 5.9 11/16/2021   Lab Results  Component Value Date   MICROALBUR 9.0 (H) 11/24/2022   LDLCALC 91 11/06/2020   CREATININE 1.14 11/15/2022    Other problems discussed today: See review of systems   Allergies as of 11/28/2022       Reactions   Boniva [ibandronate Sodium]    cramp   Calcium Channel Blockers    Upset stomach   Chlorthalidone    Diarrhea per pt   Compazine    Daughter reacts to compazine/pt does not want to take   Lyrica [pregabalin]    Dizzy   Risedronate Sodium Other (See Comments)   Upset stomach   Tape Rash   redness  Medication List        Accurate as of November 28, 2022  1:09 PM. If you have any questions, ask your nurse or doctor.          amLODipine 5 MG tablet Commonly known as: NORVASC Take 1 tablet (5 mg total) by mouth daily.   aspirin EC 81 MG tablet Take 81 mg by mouth daily.   bimatoprost 0.01 % Soln Commonly known as: LUMIGAN Place 1 drop into both eyes at bedtime.   [employer] Digestive Probiotic 250 MG capsule Generic drug: saccharomyces boulardii TAKE 1 CAPSULE BY MOUTH 2 TIMES DAILY.   Denta 5000 Plus 1.1 % Crea dental cream Generic drug: sodium fluoride Take by mouth as directed.   diphenoxylate-atropine 2.5-0.025 MG tablet Commonly known as: LOMOTIL TAKE 1 TABLET BY MOUTH FOUR TIMES A DAY AS NEEDED FOR DIARRHEA OR LOOSE STOOLS   furosemide 20 MG  tablet Commonly known as: LASIX TAKE 0.5-1 TABLETS (10-20 MG TOTAL) BY MOUTH DAILY AS NEEDED FOR EDEMA (FOR LEG SWELLING).   Jardiance 10 MG Tabs tablet Generic drug: empagliflozin TAKE 1 TABLET (10 MG TOTAL) BY MOUTH DAILY WITH BREAKFAST.   Klor-Con M20 20 MEQ tablet Generic drug: potassium chloride SA TAKE 1 TABLET BY MOUTH EVERY DAY   Nascobal 500 MCG/0.1ML Soln Generic drug: Cyanocobalamin USE 1 SPRAY IN 1 NOSTRIL  ONCE A WEEK   OneTouch Delica Plus 123456 Misc USE AS DIRECTED TO CHECK BLOOD SUGAR IN THE MORNING, AT NOON, AND AT BEDTIME   OneTouch Verio Flex System w/Device Kit 1 each by Does not apply route in the morning, at noon, and at bedtime.   OneTouch Verio test strip Generic drug: glucose blood USE ONETOUCH VERIO TEST STRIPS AS INSTRUCTED TO CHECK BLOOD SUGAR THREE TIMES DAILY.   oxybutynin 5 MG tablet Commonly known as: DITROPAN Take by mouth.   Prolia 60 MG/ML Sosy injection Generic drug: denosumab Inject 60 mg into the skin every 6 (six) months.   propranolol ER 60 MG 24 hr capsule Commonly known as: INDERAL LA TAKE 1 CAPSULE (60 MG TOTAL) BY MOUTH 2 (TWO) TIMES DAILY.   RABEprazole 20 MG tablet Commonly known as: ACIPHEX TAKE 1 TABLET BY MOUTH EVERY DAY   rosuvastatin 5 MG tablet Commonly known as: CRESTOR TAKE 1 TABLET (5 MG TOTAL) BY MOUTH DAILY.   timolol 0.5 % ophthalmic solution Commonly known as: TIMOPTIC Place 1 drop into both eyes 2 (two) times daily.   triamcinolone cream 0.1 % Commonly known as: KENALOG APPLY TO AFFECTED AREA TWICE A DAY AS NEEDED   Vitamin D3 1.25 MG (50000 UT) capsule Generic drug: Cholecalciferol TAKE ONE CAPSULE BY MOUTH EVERY 30 DAYS        Allergies:  Allergies  Allergen Reactions   Boniva [Ibandronate Sodium]     cramp   Calcium Channel Blockers     Upset stomach   Chlorthalidone     Diarrhea per pt   Compazine     Daughter reacts to compazine/pt does not want to take   Lyrica [Pregabalin]      Dizzy    Risedronate Sodium Other (See Comments)    Upset stomach   Tape Rash    redness    Past Medical History:  Diagnosis Date   Adenomatous colon polyp 04/08/2011   Anemia    Anxiety    Cataract    Diabetes mellitus without complication ([place])    "pre-diabetic" per pt   Essential hypertension 08/10/2007   Chronic  On Catapress (per Dr Ubaldo Glassing) - increase to bid - d/c Hydralazine prn per Dr Caryl Comes d/c 3/19: Increase Inderal LA to bid   GERD (gastroesophageal reflux disease)    Glaucoma (increased eye pressure)    Heart murmur    HTN (hypertension)    IBS (irritable bowel syndrome)    LBP (low back pain)    Menopause    Osteoporosis    Ovarian cancer ([place]) 09/2008   Dr Gwyneth Revels   Pancreatic cyst    Vitamin B12 deficiency    Vitamin D deficiency     Past Surgical History:  Procedure Laterality Date   ABDOMINAL HYSTERECTOMY     APPENDECTOMY     BLADDER SURGERY  04/09/2020   tack and sling   CHOLECYSTECTOMY N/A 05/15/2017   Procedure: LAPAROSCOPIC CHOLECYSTECTOMY WITH INTRAOPERATIVE CHOLANGIOGRAM AND LYSIS OF ADHESIONS;  Surgeon: Fanny Skates, MD;  Location: [facility];  Service: General;  Laterality: N/A;   COLONOSCOPY  2018   Dr.Perry   ELBOW FRACTURE SURGERY     age 71- left elbow   HEMORRHOID SURGERY  2001   Ovarian Cancer Debulking  09/2008   POLYPECTOMY      Family History  Problem Relation Age of Onset   Alzheimer's disease Mother 63   Other Mother 91       TAH for excessive bleeding   Lung cancer Father        smoker; metastasis to stomach and other areas   Heart attack Maternal Uncle    Heart disease Maternal Uncle    Other Paternal Aunt        stomach issues   Heart Problems Paternal Uncle    Other Maternal Grandmother        stomach issues; +hysterectomy   Heart attack Maternal Grandfather    Heart disease Maternal Grandfather    Infertility Daughter    Stomach cancer Cousin        dx. mid-60s   Other Cousin        stomach issues   Leukemia  Cousin 18   Stomach cancer Other    Cancer Cousin        unknown type   Other Cousin        stomach issues   Heart Problems Maternal Uncle    Diabetes Paternal Aunt    Heart Problems Paternal Uncle    Emphysema Paternal Uncle        work exposure   Breast cancer Cousin        dx. late 60s-early 70s   Colon cancer Neg Hx    Rectal cancer Neg Hx    Esophageal cancer Neg Hx     Social History:  reports that she has never smoked. She has never used smokeless tobacco. She reports that she does not drink alcohol and does not use drugs.    Review of Systems     Lipid history: She has not been on any statin drugs, lipids followed by her PCP She has been recommended statin drug but she has not been wanting to start However her LDL is now significantly higher than last year   Lab Results  Component Value Date   CHOL 163 11/06/2020   HDL 49.70 11/06/2020   [place] 91 11/06/2020   LDLDIRECT 122.0 05/24/2022   TRIG 113.0 11/06/2020   CHOLHDL 3 11/06/2020         RETINOPATHY: She is followed by ophthalmologist for background retinopathy  Hypertension followed by PCP   She is on  amlodipine and Inderal  She does monitor at home   BP Readings from Last 3 Encounters:  11/28/22 132/84  11/15/22 120/80  09/28/22 (!) 153/51   Lab Results  Component Value Date   CREATININE 1.14 11/15/2022   CREATININE 1.24 (H) 05/24/2022   CREATININE 0.97 02/03/2022   HYPOKALEMIA: Has taken potassium supplements  Lab Results  Component Value Date   K 3.9 11/15/2022    Review of Systems  She is complaining of periodic episodes of sharp knifelike pain going around the site from the lower end of her spine to the side of the abdomen and sometimes further Appears that this started last year This can also happen with certain movements of her back, she says she has had x-rays and scans with at least her urologist and possibly orthopedic surgeon and no diagnosis made, results not  available  LABS:  Lab on 11/24/2022  Component Date Value Ref Range Status   Microalb, Ur 11/24/2022 9.0 (H)  0.0 - 1.9 mg/dL Final   Creatinine,U 11/24/2022 189.5  mg/dL Final   Microalb Creat Ratio 11/24/2022 4.8  0.0 - 30.0 mg/g Final    Physical Examination:  BP 132/84 (BP Location: Left Arm, Patient Position: Sitting, Cuff Size: Normal)   Ht 5\' 4"  (1.626 m)   Wt 173 lb 6.4 oz (78.7 kg)   BMI 29.76 kg/m      ASSESSMENT/PLAN:  DIABETES with mild obesity and history of mild retinopathy:  She has mild diabetes based on fasting blood sugars over 130 previously  A1c is stable at 6.1 Blood sugars are excellent at home and she has been checking more often since she was having symptoms suggestive of known hypoglycemic postprandial syndrome  However will have sporadic slightly low readings, recently 69 at lunch and previously 58 Also this is related to relatively high carbohydrate meals especially at breakfast Until we have avoiding high-fat meals and desserts at least since the last couple of months  Not able to exercise and has gained a little weight Discussed that hypoglycemia symptoms are not related to [place] and she can start back on this but try taking it in the evening at dinnertime  Reactive hypoglycemia symptoms: This is likely related to hyperinsulinemia post high carbohydrate meals and this was discussed in detail Given her details of how to plan her breakfast meal with much more protein and significantly less carbohydrate, also cut back on carbohydrates at lunch about 50% She needs to try and check her sugar when she is symptomatic also   HYPERCHOLESTEROLEMIA: Check lipids today as she has started Crestor   Hypokalemia: Controlled   Renal function: Slightly better and microalbumin is normal  Abdominal radicular symptoms: Not clear if this is related to neuropathy or issues with radiculopathy related to thoracic spine, we will try her on gabapentin  empirically and she will follow-up and discuss with her PCP soon  Total visit time including counseling for multiple problems = 40 minutes  Elayne Snare 11/28/2022, 1:09 PM   Note: This office note was prepared with Dragon voice recognition system technology. Any transcriptional errors that result from this process are unintentional.   Addendum: LDL is unusually low at 26, she can reduce her rosuvastatin to 3 days a week  Elayne Snare

## 2022-11-28 NOTE — Patient Instructions (Addendum)
Restart Jardiance at dinner  Low Carbs esp in am

## 2022-12-08 ENCOUNTER — Telehealth: Payer: Self-pay | Admitting: *Deleted

## 2022-12-08 NOTE — Telephone Encounter (Signed)
Pt called-stated since taking jardiance in the evening about a  week, causing headache and feeling well. Pt questioning if is ok to go back taking in the morning. Please advise

## 2022-12-09 NOTE — Telephone Encounter (Signed)
Pt been notified and voiced understanding.

## 2022-12-13 ENCOUNTER — Other Ambulatory Visit: Payer: Self-pay | Admitting: Internal Medicine

## 2022-12-19 ENCOUNTER — Other Ambulatory Visit: Payer: Self-pay | Admitting: Internal Medicine

## 2022-12-19 DIAGNOSIS — H43813 Vitreous degeneration, bilateral: Secondary | ICD-10-CM | POA: Diagnosis not present

## 2022-12-19 DIAGNOSIS — H25013 Cortical age-related cataract, bilateral: Secondary | ICD-10-CM | POA: Diagnosis not present

## 2022-12-19 DIAGNOSIS — H0100A Unspecified blepharitis right eye, upper and lower eyelids: Secondary | ICD-10-CM | POA: Diagnosis not present

## 2022-12-19 DIAGNOSIS — H40023 Open angle with borderline findings, high risk, bilateral: Secondary | ICD-10-CM | POA: Diagnosis not present

## 2022-12-19 DIAGNOSIS — H52203 Unspecified astigmatism, bilateral: Secondary | ICD-10-CM | POA: Diagnosis not present

## 2022-12-19 DIAGNOSIS — H2513 Age-related nuclear cataract, bilateral: Secondary | ICD-10-CM | POA: Diagnosis not present

## 2022-12-19 DIAGNOSIS — E119 Type 2 diabetes mellitus without complications: Secondary | ICD-10-CM | POA: Diagnosis not present

## 2022-12-19 DIAGNOSIS — H524 Presbyopia: Secondary | ICD-10-CM | POA: Diagnosis not present

## 2022-12-19 LAB — HM DIABETES EYE EXAM

## 2022-12-20 ENCOUNTER — Encounter: Payer: Self-pay | Admitting: Internal Medicine

## 2022-12-26 ENCOUNTER — Telehealth: Payer: Self-pay | Admitting: Internal Medicine

## 2022-12-26 NOTE — Telephone Encounter (Signed)
Contacted [name] to schedule their annual wellness visit. Appointment made for 12/27/2022.  [facility] Care Guide [facility] AWV TEAM Direct Dial: [phone]

## 2022-12-27 ENCOUNTER — Ambulatory Visit (INDEPENDENT_AMBULATORY_CARE_PROVIDER_SITE_OTHER): Payer: Medicare Other

## 2022-12-27 VITALS — Ht 64.0 in | Wt 170.0 lb

## 2022-12-27 DIAGNOSIS — Z0000 Encounter for general adult medical examination without abnormal findings: Secondary | ICD-10-CM | POA: Diagnosis not present

## 2022-12-27 NOTE — Patient Instructions (Signed)
[name] , Thank you for taking time to come for your Medicare Wellness Visit. I appreciate your ongoing commitment to your health goals. Please review the following plan we discussed and let me know if I can assist you in the future.   These are the goals we discussed:  Goals      Client will verbalize knowledge of diabetes self-management as evidenced by Hgb A1C <7 or as defined by provider.         Patient Stated     Patient Stated     Wants health to improve.        This is a list of the screening recommended for you and due dates:  Health Maintenance  Topic Date Due   Complete foot exam   Never done   Hepatitis C Screening: USPSTF Recommendation to screen - Ages 66-79 yo.  Never done   COVID-19 Vaccine (4 - 2023-24 season) 05/06/2022   DTaP/Tdap/Td vaccine (2 - Tdap) 02/06/2023   Hemoglobin A1C  05/18/2023   Colon Cancer Screening  09/09/2023   Yearly kidney function blood test for diabetes  11/15/2023   Yearly kidney health urinalysis for diabetes  11/24/2023   Eye exam for diabetics  12/19/2023   Medicare Annual Wellness Visit  12/27/2023   Pneumonia Vaccine  Completed   DEXA scan (bone density measurement)  Completed   HPV Vaccine  Aged Out   Flu Shot  Discontinued   Zoster (Shingles) Vaccine  Discontinued    Advanced directives: Discussed with patient  Conditions/risks identified: Keep up the good work  Next appointment: Follow up in one year for your annual wellness visit 12/28/2023   Preventive Care 65 Years and Older, Female Preventive care refers to lifestyle choices and visits with your health care provider that can promote health and wellness. What does preventive care include? A yearly physical exam. This is also called an annual well check. Dental exams once or twice a year. Routine eye exams. Ask your health care provider how often you should have your eyes checked. Personal lifestyle choices, including: Daily care of your teeth and gums. Regular  physical activity. Eating a healthy diet. Avoiding tobacco and drug use. Limiting alcohol use. Practicing safe sex. Taking low-dose aspirin every day. Taking vitamin and mineral supplements as recommended by your health care provider. What happens during an annual well check? The services and screenings done by your health care provider during your annual well check will depend on your age, overall health, lifestyle risk factors, and family history of disease. Counseling  Your health care provider may ask you questions about your: Alcohol use. Tobacco use. Drug use. Emotional well-being. Home and relationship well-being. Sexual activity. Eating habits. History of falls. Memory and ability to understand (cognition). Work and work Astronomer. Reproductive health. Screening  You may have the following tests or measurements: Height, weight, and BMI. Blood pressure. Lipid and cholesterol levels. These may be checked every 5 years, or more frequently if you are over 29 years old. Skin check. Lung cancer screening. You may have this screening every year starting at age 71 if you have a 30-pack-year history of smoking and currently smoke or have quit within the past 15 years. Fecal occult blood test (FOBT) of the stool. You may have this test every year starting at age 26. Flexible sigmoidoscopy or colonoscopy. You may have a sigmoidoscopy every 5 years or a colonoscopy every 10 years starting at age 70. Hepatitis C blood test. Hepatitis B blood test.  Sexually transmitted disease (STD) testing. Diabetes screening. This is done by checking your blood sugar (glucose) after you have not eaten for a while (fasting). You may have this done every 1-3 years. Bone density scan. This is done to screen for osteoporosis. You may have this done starting at age 15. Mammogram. This may be done every 1-2 years. Talk to your health care provider about how often you should have regular mammograms. Talk  with your health care provider about your test results, treatment options, and if necessary, the need for more tests. Vaccines  Your health care provider may recommend certain vaccines, such as: Influenza vaccine. This is recommended every year. Tetanus, diphtheria, and acellular pertussis (Tdap, Td) vaccine. You may need a Td booster every 10 years. Zoster vaccine. You may need this after age 10. Pneumococcal 13-valent conjugate (PCV13) vaccine. One dose is recommended after age 62. Pneumococcal polysaccharide (PPSV23) vaccine. One dose is recommended after age 68. Talk to your health care provider about which screenings and vaccines you need and how often you need them. This information is not intended to replace advice given to you by your health care provider. Make sure you discuss any questions you have with your health care provider. Document Released: 09/18/2015 Document Revised: 05/11/2016 Document Reviewed: 06/23/2015 Elsevier Interactive Patient Education  2017 [place] Prevention in the Home Falls can cause injuries. They can happen to people of all ages. There are many things you can do to make your home safe and to help prevent falls. What can I do on the outside of my home? Regularly fix the edges of walkways and driveways and fix any cracks. Remove anything that might make you trip as you walk through a door, such as a raised step or threshold. Trim any bushes or trees on the path to your home. Use bright outdoor lighting. Clear any walking paths of anything that might make someone trip, such as rocks or tools. Regularly check to see if handrails are loose or broken. Make sure that both sides of any steps have handrails. Any raised decks and porches should have guardrails on the edges. Have any leaves, snow, or ice cleared regularly. Use sand or salt on walking paths during winter. Clean up any spills in your garage right away. This includes oil or grease  spills. What can I do in the bathroom? Use night lights. Install grab bars by the toilet and in the tub and shower. Do not use towel bars as grab bars. Use non-skid mats or decals in the tub or shower. If you need to sit down in the shower, use a plastic, non-slip stool. Keep the floor dry. Clean up any water that spills on the floor as soon as it happens. Remove soap buildup in the tub or shower regularly. Attach bath mats securely with double-sided non-slip rug tape. Do not have throw rugs and other things on the floor that can make you trip. What can I do in the bedroom? Use night lights. Make sure that you have a light by your bed that is easy to reach. Do not use any sheets or blankets that are too big for your bed. They should not hang down onto the floor. Have a firm chair that has side arms. You can use this for support while you get dressed. Do not have throw rugs and other things on the floor that can make you trip. What can I do in the kitchen? Clean up any spills right away.  Avoid walking on wet floors. Keep items that you use a lot in easy-to-reach places. If you need to reach something above you, use a strong step stool that has a grab bar. Keep electrical cords out of the way. Do not use floor polish or wax that makes floors slippery. If you must use wax, use non-skid floor wax. Do not have throw rugs and other things on the floor that can make you trip. What can I do with my stairs? Do not leave any items on the stairs. Make sure that there are handrails on both sides of the stairs and use them. Fix handrails that are broken or loose. Make sure that handrails are as long as the stairways. Check any carpeting to make sure that it is firmly attached to the stairs. Fix any carpet that is loose or worn. Avoid having throw rugs at the top or bottom of the stairs. If you do have throw rugs, attach them to the floor with carpet tape. Make sure that you have a light switch at the  top of the stairs and the bottom of the stairs. If you do not have them, ask someone to add them for you. What else can I do to help prevent falls? Wear shoes that: Do not have high heels. Have rubber bottoms. Are comfortable and fit you well. Are closed at the toe. Do not wear sandals. If you use a stepladder: Make sure that it is fully opened. Do not climb a closed stepladder. Make sure that both sides of the stepladder are locked into place. Ask someone to hold it for you, if possible. Clearly mark and make sure that you can see: Any grab bars or handrails. First and last steps. Where the edge of each step is. Use tools that help you move around (mobility aids) if they are needed. These include: Canes. Walkers. Scooters. Crutches. Turn on the lights when you go into a dark area. Replace any light bulbs as soon as they burn out. Set up your furniture so you have a clear path. Avoid moving your furniture around. If any of your floors are uneven, fix them. If there are any pets around you, be aware of where they are. Review your medicines with your doctor. Some medicines can make you feel dizzy. This can increase your chance of falling. Ask your doctor what other things that you can do to help prevent falls. This information is not intended to replace advice given to you by your health care provider. Make sure you discuss any questions you have with your health care provider. Document Released: 06/18/2009 Document Revised: 01/28/2016 Document Reviewed: 09/26/2014 Elsevier Interactive Patient Education  2017 [employer].

## 2022-12-27 NOTE — Progress Notes (Addendum)
Subjective:   [name] is a 77 y.o. female who presents for Medicare Annual (Subsequent) preventive examination.  I connected with  [name] on 12/27/22 by a audio enabled telemedicine application and verified that I am speaking with the correct person using two identifiers.  Patient Location: Home  Provider Location: Home Office  I discussed the limitations of evaluation and management by telemedicine. The patient expressed understanding and agreed to proceed.      Review of Systems     Cardiac Risk Factors include: advanced age (>32men, >50 women);dyslipidemia;hypertension     Objective:    Today's Vitals   12/27/22 1450  Weight: 170 lb (77.1 kg)  Height: 5\' 4"  (1.626 m)   Body mass index is 29.18 kg/m.     12/27/2022    1:52 PM 02/03/2022    3:44 PM 12/31/2021    9:34 AM 01/05/2021    5:16 PM 12/23/2020    4:11 PM 10/16/2018    1:32 PM 04/27/2018   10:49 AM  Advanced Directives  Does Patient Have a Medical Advance Directive? No Yes Yes Yes Yes Yes Yes  Type of Furniture conservator/restorer;Living will Healthcare Power of [place];Living will Healthcare Power of [place];Living will Living will;Healthcare Power of [employer] Power of [place];Living will   Does patient want to make changes to medical advance directive?     No - Patient declined No - Patient declined   Copy of Healthcare Power of Attorney in Chart?   No - copy requested No - copy requested No - copy requested No - copy requested   Would patient like information on creating a medical advance directive? No - Patient declined          Current Medications (verified) Outpatient Encounter Medications as of 12/27/2022  Medication Sig   amLODipine (NORVASC) 5 MG tablet Take 1 tablet (5 mg total) by mouth daily.   aspirin 81 MG EC tablet Take 81 mg by mouth daily.   bimatoprost (LUMIGAN) 0.01 % SOLN Place 1 drop into both eyes at bedtime.   Blood Glucose Monitoring  Suppl (ONETOUCH VERIO FLEX SYSTEM) w/Device KIT 1 each by Does not apply route in the morning, at noon, and at bedtime.    [employer] DIGESTIVE PROBIOTIC 250 MG capsule TAKE 1 CAPSULE BY MOUTH 2 TIMES DAILY.   DENTA 5000 PLUS 1.1 % CREA dental cream Take by mouth as directed.   diphenoxylate-atropine (LOMOTIL) 2.5-0.025 MG tablet TAKE 1 TABLET BY MOUTH FOUR TIMES A DAY AS NEEDED FOR DIARRHEA OR LOOSE STOOLS   furosemide (LASIX) 20 MG tablet TAKE 0.5-1 TABLETS (10-20 MG TOTAL) BY MOUTH DAILY AS NEEDED FOR EDEMA (FOR LEG SWELLING).   JARDIANCE 10 MG TABS tablet TAKE 1 TABLET (10 MG TOTAL) BY MOUTH DAILY WITH BREAKFAST.   KLOR-CON M20 20 MEQ tablet TAKE 1 TABLET BY MOUTH EVERY DAY   Lancets (ONETOUCH DELICA PLUS LANCET30G) MISC USE AS DIRECTED TO CHECK BLOOD SUGAR IN THE MORNING, AT NOON, AND AT BEDTIME   NASCOBAL 500 MCG/0.1ML SOLN USE 1 SPRAY IN 1 NOSTRIL ONCE A WEEK   ONETOUCH VERIO test strip USE ONETOUCH VERIO TEST STRIPS AS INSTRUCTED TO CHECK BLOOD SUGAR THREE TIMES DAILY.   PROLIA 60 MG/ML SOSY injection Inject 60 mg into the skin every 6 (six) months.   propranolol ER (INDERAL LA) 60 MG 24 hr capsule TAKE 1 CAPSULE (60 MG TOTAL) BY MOUTH 2 (TWO) TIMES DAILY.   RABEprazole (ACIPHEX) 20  MG tablet TAKE 1 TABLET BY MOUTH EVERY DAY   rosuvastatin (CRESTOR) 5 MG tablet TAKE 1 TABLET (5 MG TOTAL) BY MOUTH DAILY.   timolol (TIMOPTIC) 0.5 % ophthalmic solution Place 1 drop into both eyes 2 (two) times daily.   triamcinolone cream (KENALOG) 0.1 % APPLY TO AFFECTED AREA TWICE A DAY AS NEEDED   VITAMIN D3 1.25 MG (50000 UT) capsule TAKE ONE CAPSULE BY MOUTH EVERY 30 DAYS   gabapentin (NEURONTIN) 100 MG capsule Take 1 capsule (100 mg total) by mouth 3 (three) times daily. (Patient not taking: Reported on 12/27/2022)   oxybutynin (DITROPAN) 5 MG tablet Take by mouth. (Patient not taking: Reported on 12/27/2022)   No facility-administered encounter medications on file as of 12/27/2022.    Allergies  (verified) Boniva [ibandronate sodium], Calcium channel blockers, Chlorthalidone, Compazine, Lyrica [pregabalin], Risedronate sodium, and Tape   History: Past Medical History:  Diagnosis Date   Adenomatous colon polyp 04/08/2011   Anemia    Anxiety    Cataract    Diabetes mellitus without complication    "pre-diabetic" per pt   Essential hypertension 08/10/2007   Chronic  On Catapress (per Dr Nicholas Lose) - increase to bid - d/c Hydralazine prn per Dr Graciela Husbands d/c 3/19: Increase Inderal LA to bid   GERD (gastroesophageal reflux disease)    Glaucoma (increased eye pressure)    Heart murmur    HTN (hypertension)    IBS (irritable bowel syndrome)    LBP (low back pain)    Menopause    Osteoporosis    Ovarian cancer 09/2008   Dr Janae Sauce   Pancreatic cyst    Vitamin B12 deficiency    Vitamin D deficiency    Past Surgical History:  Procedure Laterality Date   ABDOMINAL HYSTERECTOMY     APPENDECTOMY     BLADDER SURGERY  04/09/2020   tack and sling   CHOLECYSTECTOMY N/A 05/15/2017   Procedure: LAPAROSCOPIC CHOLECYSTECTOMY WITH INTRAOPERATIVE CHOLANGIOGRAM AND LYSIS OF ADHESIONS;  Surgeon: Claud Kelp, MD;  Location: [facility];  Service: General;  Laterality: N/A;   COLONOSCOPY  2018   Dr.Perry   ELBOW FRACTURE SURGERY     age 26- left elbow   HEMORRHOID SURGERY  2001   Ovarian Cancer Debulking  09/2008   POLYPECTOMY     Family History  Problem Relation Age of Onset   Alzheimer's disease Mother 60   Other Mother 20       TAH for excessive bleeding   Lung cancer Father        smoker; metastasis to stomach and other areas   Heart attack Maternal Uncle    Heart disease Maternal Uncle    Other Paternal Aunt        stomach issues   Heart Problems Paternal Uncle    Other Maternal Grandmother        stomach issues; +hysterectomy   Heart attack Maternal Grandfather    Heart disease Maternal Grandfather    Infertility Daughter    Stomach cancer Cousin        dx. mid-60s   Other  Cousin        stomach issues   Leukemia Cousin 18   Stomach cancer Other    Cancer Cousin        unknown type   Other Cousin        stomach issues   Heart Problems Maternal Uncle    Diabetes Paternal Aunt    Heart Problems Paternal Uncle    Emphysema  Paternal Uncle        work exposure   Breast cancer Cousin        dx. late 60s-early 70s   Colon cancer Neg Hx    Rectal cancer Neg Hx    Esophageal cancer Neg Hx    Social History   Socioeconomic History   Marital status: Married    Spouse name: Jillyn Hidden   Number of children: 2   Years of education: BS   Highest education level: Not on file  Occupational History   Occupation: Dentist: HOMEMAKER  Tobacco Use   Smoking status: Never   Smokeless tobacco: Never  Vaping Use   Vaping Use: Never used  Substance and Sexual Activity   Alcohol use: No    Alcohol/week: 0.0 standard drinks of alcohol   Drug use: No   Sexual activity: Not on file  Other Topics Concern   Not on file  Social History Narrative   Lives with spouse   Caffeine use: cokes      Right handed    Social Determinants of Health   Financial Resource Strain: Low Risk  (12/27/2022)   Overall Financial Resource Strain (CARDIA)    Difficulty of Paying Living Expenses: Not hard at all  Food Insecurity: No Food Insecurity (12/27/2022)   Hunger Vital Sign    Worried About Running Out of Food in the Last Year: Never true    Ran Out of Food in the Last Year: Never true  Transportation Needs: No Transportation Needs (12/27/2022)   PRAPARE - Administrator, Civil Service (Medical): No    Lack of Transportation (Non-Medical): No  Physical Activity: Inactive (12/27/2022)   Exercise Vital Sign    Days of Exercise per Week: 0 days    Minutes of Exercise per Session: 0 min  Stress: Stress Concern Present (12/27/2022)   [employer] of Occupational Health - Occupational Stress Questionnaire    Feeling of Stress : To some extent  Social  Connections: Moderately Integrated (12/27/2022)   Social Connection and Isolation Panel [NHANES]    Frequency of Communication with Friends and Family: More than three times a week    Frequency of Social Gatherings with Friends and Family: More than three times a week    Attends Religious Services: Never    Database administrator or Organizations: Yes    Attends Engineer, structural: 1 to 4 times per year    Marital Status: Married    Tobacco Counseling Counseling given: Not Answered   Clinical Intake:  Pre-visit preparation completed: Yes  Pain : No/denies pain     Nutritional Risks: None Diabetes: No  How often do you need to have someone help you when you read instructions, pamphlets, or other written materials from your doctor or pharmacy?: 1 - Never  Diabetic? Yes  Interpreter Needed?: No      Activities of Daily Living    12/27/2022    1:56 PM 12/31/2021    9:38 AM  In your present state of health, do you have any difficulty performing the following activities:  Hearing? 0 0  Vision? 0 0  Difficulty concentrating or making decisions? 0 0  Walking or climbing stairs? 1 0  Comment climbing stairs slower than before   Dressing or bathing? 0 0  Doing errands, shopping? 0 0  Preparing Food and eating ? N N  Using the Toilet? N N  In the past six months, have you accidently  leaked urine? Y N  Do you have problems with loss of bowel control? N N  Managing your Medications? N N  Managing your Finances? N N  Housekeeping or managing your Housekeeping? N N    Patient Care Team: [name], [name], MD as PCP - General (Internal Medicine) Janet Berlin, MD (Ophthalmology) Arita Miss, MD as Attending Physician (Nephrology) Hilarie Fredrickson, MD as Consulting Physician (Gastroenterology) Reather Littler, MD as Consulting Physician (Endocrinology) De Blanch, MD as Attending Physician (Gynecology) York Spaniel, MD (Inactive) as Consulting  Physician (Neurology) Jerrell Mylar, MD as Referring Physician (Gynecology)  Indicate any recent Medical Services you may have received from other than [facility] providers in the past year (date may be approximate).     Assessment:   This is a routine wellness examination for [place].  Hearing/Vision screen Hearing Screening - Comments:: Denies hearing difficulties   Vision Screening - Comments::  Up to date with routine eye exams with  Dr. Burgess [facility]elle at [facility]  Dietary issues and exercise activities discussed: Current Exercise Habits: The patient does not participate in regular exercise at present   Goals Addressed             This Visit's Progress    Patient Stated       Wants health to improve.       Depression Screen    12/27/2022    2:00 PM 11/15/2022    3:05 PM 08/10/2022   10:56 AM 12/31/2021    9:36 AM 12/23/2020    4:10 PM 11/07/2019    1:37 PM 04/27/2018   10:49 AM  PHQ 2/9 Scores  PHQ - 2 Score 0 0 0 0 0 0 0    Fall Risk    12/27/2022    1:39 PM 11/15/2022    3:05 PM 08/10/2022   10:56 AM 12/31/2021    9:35 AM 12/23/2020    4:12 PM  Fall Risk   Falls in the past year? 0 0 0 0 1  Number falls in past yr: 0 0 0 0 0  Injury with Fall? 0 0 0 0 0  Risk for fall due to : No Fall Risks No Fall Risks No Fall Risks    Follow up Falls prevention discussed Falls evaluation completed Falls evaluation completed Falls evaluation completed     FALL RISK PREVENTION PERTAINING TO THE HOME:  Any stairs in or around the home? Yes  If so, are there any without handrails? No  Home free of loose throw rugs in walkways, pet beds, electrical cords, etc? No  Adequate lighting in your home to reduce risk of falls? Yes   ASSISTIVE DEVICES UTILIZED TO PREVENT FALLS:  Life alert? No  Use of a cane, walker or w/c? No  Grab bars in the bathroom? No  Shower chair or bench in shower? No  Elevated toilet seat or a handicapped toilet? Yes   TIMED UP AND GO:  Was  the test performed? No . televisit    Cognitive Function:        12/27/2022    1:59 PM  6CIT Screen  What Year? 0 points  What month? 0 points  What time? 0 points  Count back from 20 0 points  Months in reverse 0 points  Repeat phrase 4 points  Total Score 4 points    Immunizations Immunization History  Administered Date(s) Administered   Influenza Whole 06/16/2009   Influenza, High Dose Seasonal PF 08/07/2013, 05/18/2016, 07/01/2020  Influenza, Seasonal, Injecte, Preservative Fre 08/07/2012   Influenza,inj,Quad PF,6+ Mos 05/27/2014, 08/02/2017, 07/16/2018, 07/24/2019   PFIZER(Purple Top)SARS-COV-2 Vaccination 10/11/2019, 11/05/2019, 06/02/2020   Pneumococcal Conjugate-13 01/17/2018   Pneumococcal Polysaccharide-23 11/16/2015   Td 02/05/2013    TDAP status: Up to date  Flu Vaccine status: Up to date  Pneumococcal vaccine status: Up to date  Covid-19 vaccine status: Information provided on how to obtain vaccines.   Qualifies for Shingles Vaccine? Yes   Zostavax completed No   Shingrix Completed?: Yes  Screening Tests Health Maintenance  Topic Date Due   FOOT EXAM  Never done   Hepatitis C Screening  Never done   COVID-19 Vaccine (4 - 2023-24 season) 05/06/2022   DTaP/Tdap/Td (2 - Tdap) 02/06/2023   HEMOGLOBIN A1C  05/18/2023   COLONOSCOPY (Pts 45-67yrs Insurance coverage will need to be confirmed)  09/09/2023   Diabetic kidney evaluation - eGFR measurement  11/15/2023   Diabetic kidney evaluation - Urine ACR  11/24/2023   OPHTHALMOLOGY EXAM  12/19/2023   Medicare Annual Wellness (AWV)  12/27/2023   Pneumonia Vaccine 11+ Years old  Completed   DEXA SCAN  Completed   HPV VACCINES  Aged Out   INFLUENZA VACCINE  Discontinued   Zoster Vaccines- Shingrix  Discontinued    Health Maintenance  Health Maintenance Due  Topic Date Due   FOOT EXAM  Never done   Hepatitis C Screening  Never done   COVID-19 Vaccine (4 - 2023-24 season) 05/06/2022     Colorectal cancer screening: Type of screening: Colonoscopy. Completed  . Repeat every 3 years  Mammogram status: Completed  . Repeat every year  Bone Density status: Completed  . Results reflect: Bone density results: NORMAL. Repeat every 2 years.  Lung Cancer Screening: (Low Dose CT Chest recommended if Age 41-80 years, 30 pack-year currently smoking OR have quit w/in 15years.) does not qualify.   Lung Cancer Screening Referral: N/A  Additional Screening:  Hepatitis C Screening: does qualify; Completed No  Vision Screening: Recommended annual ophthalmology exams for early detection of glaucoma and other disorders of the eye. Is the patient up to date with their annual eye exam?  Yes  Who is the provider or what is the name of the office in which the patient attends annual eye exams? Dr. Burgess Estelle at [facility]  If pt is not established with a provider, would they like to be referred to a provider to establish care? No .   Dental Screening: Recommended annual dental exams for proper oral hygiene  Community Resource Referral / Chronic Care Management: CRR required this visit?  No   CCM required this visit?  No      Plan:     I have personally reviewed and noted the following in the patient's chart:   Medical and social history Use of alcohol, tobacco or illicit drugs  Current medications and supplements including opioid prescriptions. Patient is not currently taking opioid prescriptions. Functional ability and status Nutritional status Physical activity Advanced directives List of other physicians Hospitalizations, surgeries, and ER visits in previous 12 months Vitals Screenings to include cognitive, depression, and falls Referrals and appointments  In addition, I have reviewed and discussed with patient certain preventive protocols, quality metrics, and best practice recommendations. A written personalized care plan for preventive services as well as  general preventive health recommendations were provided to patient.     Milus Mallick, CMA   12/27/2022   Nurse Notes: No concerns   Medical screening examination/treatment/procedure(s) were  performed by non-physician practitioner and as supervising physician I was immediately available for consultation/collaboration.  I agree with above. Lew Dawes, MD

## 2022-12-28 ENCOUNTER — Ambulatory Visit (INDEPENDENT_AMBULATORY_CARE_PROVIDER_SITE_OTHER): Payer: Medicare Other | Admitting: Internal Medicine

## 2022-12-28 ENCOUNTER — Encounter: Payer: Self-pay | Admitting: Internal Medicine

## 2022-12-28 VITALS — BP 126/62 | HR 64 | Temp 98.0°F | Ht 64.0 in | Wt 172.0 lb

## 2022-12-28 DIAGNOSIS — I10 Essential (primary) hypertension: Secondary | ICD-10-CM | POA: Diagnosis not present

## 2022-12-28 DIAGNOSIS — N3941 Urge incontinence: Secondary | ICD-10-CM

## 2022-12-28 DIAGNOSIS — Z7984 Long term (current) use of oral hypoglycemic drugs: Secondary | ICD-10-CM

## 2022-12-28 DIAGNOSIS — K5900 Constipation, unspecified: Secondary | ICD-10-CM

## 2022-12-28 DIAGNOSIS — E559 Vitamin D deficiency, unspecified: Secondary | ICD-10-CM

## 2022-12-28 DIAGNOSIS — E669 Obesity, unspecified: Secondary | ICD-10-CM

## 2022-12-28 DIAGNOSIS — E1169 Type 2 diabetes mellitus with other specified complication: Secondary | ICD-10-CM | POA: Diagnosis not present

## 2022-12-28 DIAGNOSIS — K580 Irritable bowel syndrome with diarrhea: Secondary | ICD-10-CM

## 2022-12-28 MED ORDER — LORAZEPAM 0.5 MG PO TABS: 0.5000 mg | ORAL_TABLET | Freq: Two times a day (BID) | ORAL | 2 refills | Status: DC | PRN

## 2022-12-28 NOTE — Patient Instructions (Signed)
Try Metamucil daily Check on Hyoscyamine tablet at home

## 2022-12-28 NOTE — Assessment & Plan Note (Signed)
On Vit D 

## 2022-12-28 NOTE — Assessment & Plan Note (Signed)
Try Metamucil daily 

## 2022-12-28 NOTE — Progress Notes (Signed)
Subjective:  Patient ID: [name], female    DOB: Oct 08, 1945  Age: 77 y.o. MRN: [id number]  CC: No chief complaint on file.   HPI [name] presents for HTN, not feeling well, IBS Dr Lucianne Muss gave Gabapentin Rx for abd pain And the patient is here with her husband  Outpatient Medications Prior to Visit  Medication Sig Dispense Refill   amLODipine (NORVASC) 5 MG tablet Take 1 tablet (5 mg total) by mouth daily. 90 tablet 3   aspirin 81 MG EC tablet Take 81 mg by mouth daily.     bimatoprost (LUMIGAN) 0.01 % SOLN Place 1 drop into both eyes at bedtime.     Blood Glucose Monitoring Suppl (ONETOUCH VERIO FLEX SYSTEM) w/Device KIT 1 each by Does not apply route in the morning, at noon, and at bedtime.      CVS DIGESTIVE PROBIOTIC 250 MG capsule TAKE 1 CAPSULE BY MOUTH 2 TIMES DAILY. 50 capsule 2   DENTA 5000 PLUS 1.1 % CREA dental cream Take by mouth as directed.     diphenoxylate-atropine (LOMOTIL) 2.5-0.025 MG tablet TAKE 1 TABLET BY MOUTH FOUR TIMES A DAY AS NEEDED FOR DIARRHEA OR LOOSE STOOLS 60 tablet 3   furosemide (LASIX) 20 MG tablet TAKE 0.5-1 TABLETS (10-20 MG TOTAL) BY MOUTH DAILY AS NEEDED FOR EDEMA (FOR LEG SWELLING). 90 tablet 1   gabapentin (NEURONTIN) 100 MG capsule Take 1 capsule (100 mg total) by mouth 3 (three) times daily. 90 capsule 3   JARDIANCE 10 MG TABS tablet TAKE 1 TABLET (10 MG TOTAL) BY MOUTH DAILY WITH BREAKFAST. 90 tablet 2   KLOR-CON M20 20 MEQ tablet TAKE 1 TABLET BY MOUTH EVERY DAY 90 tablet 3   Lancets (ONETOUCH DELICA PLUS LANCET30G) MISC USE AS DIRECTED TO CHECK BLOOD SUGAR IN THE MORNING, AT NOON, AND AT BEDTIME 300 each 2   NASCOBAL 500 MCG/0.1ML SOLN USE 1 SPRAY IN 1 NOSTRIL ONCE A WEEK 12 each 3   ONETOUCH VERIO test strip USE ONETOUCH VERIO TEST STRIPS AS INSTRUCTED TO CHECK BLOOD SUGAR THREE TIMES DAILY. 300 strip 1   oxybutynin (DITROPAN) 5 MG tablet Take by mouth.     PROLIA 60 MG/ML SOSY injection Inject 60 mg into the skin every 6  (six) months. 60 mL 0   propranolol ER (INDERAL LA) 60 MG 24 hr capsule TAKE 1 CAPSULE (60 MG TOTAL) BY MOUTH 2 (TWO) TIMES DAILY. 180 capsule 3   RABEprazole (ACIPHEX) 20 MG tablet TAKE 1 TABLET BY MOUTH EVERY DAY 90 tablet 3   rosuvastatin (CRESTOR) 5 MG tablet TAKE 1 TABLET (5 MG TOTAL) BY MOUTH DAILY. 90 tablet 1   timolol (TIMOPTIC) 0.5 % ophthalmic solution Place 1 drop into both eyes 2 (two) times daily.     triamcinolone cream (KENALOG) 0.1 % APPLY TO AFFECTED AREA TWICE A DAY AS NEEDED     VITAMIN D3 1.25 MG (50000 UT) capsule TAKE ONE CAPSULE BY MOUTH EVERY 30 DAYS 9 capsule 1   No facility-administered medications prior to visit.    ROS: Review of Systems  Constitutional:  Negative for activity change, appetite change, chills, fatigue and unexpected weight change.  HENT:  Negative for congestion, mouth sores and sinus pressure.   Eyes:  Negative for visual disturbance.  Respiratory:  Negative for cough and chest tightness.   Gastrointestinal:  Positive for diarrhea. Negative for abdominal pain, nausea and vomiting.  Genitourinary:  Negative for difficulty urinating, frequency and vaginal pain.  Musculoskeletal:  Negative for back pain and gait problem.  Skin:  Negative for pallor and rash.  Neurological:  Positive for weakness and light-headedness. Negative for dizziness, tremors, numbness and headaches.  Psychiatric/Behavioral:  Negative for confusion, sleep disturbance and suicidal ideas. The patient is nervous/anxious.     Objective:  BP 126/62 (BP Location: Left Arm, Patient Position: Sitting, Cuff Size: Normal)   Pulse 64   Temp 98 F (36.7 C) (Oral)   Ht 5\' 4"  (1.626 m)   Wt 172 lb (78 kg)   SpO2 97%   BMI 29.52 kg/m   BP Readings from Last 3 Encounters:  12/28/22 126/62  11/28/22 132/84  11/15/22 120/80    Wt Readings from Last 3 Encounters:  12/28/22 172 lb (78 kg)  12/27/22 170 lb (77.1 kg)  11/28/22 173 lb 6.4 oz (78.7 kg)    Physical  Exam Constitutional:      General: She is not in acute distress.    Appearance: She is well-developed. She is obese.  HENT:     Head: Normocephalic.     Right Ear: External ear normal.     Left Ear: External ear normal.     Nose: Nose normal.  Eyes:     General:        Right eye: No discharge.        Left eye: No discharge.     Conjunctiva/sclera: Conjunctivae normal.     Pupils: Pupils are equal, round, and reactive to light.  Neck:     Thyroid: No thyromegaly.     Vascular: No JVD.     Trachea: No tracheal deviation.  Cardiovascular:     Rate and Rhythm: Normal rate and regular rhythm.     Heart sounds: Normal heart sounds.  Pulmonary:     Effort: No respiratory distress.     Breath sounds: No stridor. No wheezing.  Abdominal:     General: Bowel sounds are normal. There is no distension.     Palpations: Abdomen is soft. There is no mass.     Tenderness: There is no abdominal tenderness. There is no guarding or rebound.  Musculoskeletal:        General: No tenderness.     Cervical back: Normal range of motion and neck supple. No rigidity.  Lymphadenopathy:     Cervical: No cervical adenopathy.  Skin:    Findings: No erythema or rash.  Neurological:     Mental Status: She is oriented to person, place, and time.     Cranial Nerves: No cranial nerve deficit.     Motor: No abnormal muscle tone.     Coordination: Coordination normal.     Deep Tendon Reflexes: Reflexes normal.  Psychiatric:        Behavior: Behavior normal.        Thought Content: Thought content normal.        Judgment: Judgment normal.     Lab Results  Component Value Date   WBC 6.4 11/15/2022   HGB 13.1 11/15/2022   HCT 38.6 11/15/2022   PLT 198.0 11/15/2022   GLUCOSE 112 (H) 11/15/2022   CHOL 115 11/28/2022   TRIG 149.0 11/28/2022   HDL 59.60 11/28/2022   LDLDIRECT 122.0 05/24/2022   LDLCALC 26 11/28/2022   ALT 20 11/15/2022   AST 24 11/15/2022   NA 140 11/15/2022   K 3.9 11/15/2022    CL 104 11/15/2022   CREATININE 1.14 11/15/2022   BUN 29 (H) 11/15/2022   CO2  27 11/15/2022   TSH 1.61 11/15/2022   HGBA1C 6.1 11/15/2022   MICROALBUR 9.0 (H) 11/24/2022    MM 3D SCREEN BREAST BILATERAL  Result Date: 02/24/2022 CLINICAL DATA:  Screening. EXAM: DIGITAL SCREENING BILATERAL MAMMOGRAM WITH TOMOSYNTHESIS AND CAD TECHNIQUE: Bilateral screening digital craniocaudal and mediolateral oblique mammograms were obtained. Bilateral screening digital breast tomosynthesis was performed. The images were evaluated with computer-aided detection. COMPARISON:  Previous exam(s). ACR Breast Density Category c: The breast tissue is heterogeneously dense, which may obscure small masses. FINDINGS: There are no findings suspicious for malignancy. IMPRESSION: No mammographic evidence of malignancy. A result letter of this screening mammogram will be mailed directly to the patient. RECOMMENDATION: Screening mammogram in one year. (Code:SM-B-01Y) BI-RADS CATEGORY  1: Negative. Electronically Signed   By: Harmon Pier M.D.   On: 02/24/2022 15:36    Assessment & Plan:   Problem List Items Addressed This Visit     Vitamin D deficiency    On Vit D      Obstipation    Try Metamucil daily      IBS (irritable bowel syndrome)    Try Metamucil daily      HTN (hypertension) - Primary    Check BP when feeling normal      Type 2 diabetes mellitus with obesity (HCC)    F/u w/Dr Lucianne Muss On Jardiance      Urge incontinence    Check on Hyoscyamine tablet at home          Meds ordered this encounter  Medications   LORazepam (ATIVAN) 0.5 MG tablet    Sig: Take 1 tablet (0.5 mg total) by mouth 2 (two) times daily as needed for anxiety.    Dispense:  60 tablet    Refill:  2      Follow-up: Return in about 3 months (around 03/29/2023) for a follow-up visit.  Sonda Primes, MD

## 2022-12-28 NOTE — Assessment & Plan Note (Signed)
Check BP when feeling normal

## 2022-12-28 NOTE — Assessment & Plan Note (Signed)
Check on Hyoscyamine tablet at home

## 2022-12-28 NOTE — Assessment & Plan Note (Signed)
F/u w/Dr Kumar On Jardiance 

## 2023-01-07 ENCOUNTER — Encounter: Payer: Self-pay | Admitting: Internal Medicine

## 2023-01-07 ENCOUNTER — Other Ambulatory Visit: Payer: Self-pay | Admitting: Internal Medicine

## 2023-01-27 ENCOUNTER — Telehealth: Payer: Self-pay

## 2023-01-27 ENCOUNTER — Other Ambulatory Visit: Payer: Self-pay | Admitting: Internal Medicine

## 2023-01-27 DIAGNOSIS — R1011 Right upper quadrant pain: Secondary | ICD-10-CM

## 2023-01-27 DIAGNOSIS — M810 Age-related osteoporosis without current pathological fracture: Secondary | ICD-10-CM

## 2023-01-27 NOTE — Telephone Encounter (Signed)
Pt is needing an Therapist, occupational for her Prolia as she is due for her injection.

## 2023-01-27 NOTE — Telephone Encounter (Signed)
Prolia VOB initiated via [email or web address] 

## 2023-01-27 NOTE — Telephone Encounter (Signed)
Created new encounter for Prolia BIV. Will route encounter back once benefit verification is complete.  

## 2023-01-31 ENCOUNTER — Other Ambulatory Visit (HOSPITAL_COMMUNITY): Payer: Self-pay

## 2023-02-02 ENCOUNTER — Other Ambulatory Visit (HOSPITAL_COMMUNITY): Payer: Self-pay

## 2023-02-02 NOTE — Telephone Encounter (Signed)
Pt ready for scheduling for Prolia on or after : 02/24/23  Out-of-pocket cost due at time of visit: $151  Primary: [place] [employer] Prolia co-insurance: 20% Admin fee co-insurance: 20%  Secondary: [employer] [place] [employer] Prolia co-insurance: 90% covered after deductible is met Admin fee co-insurance: 0  Medical Benefit Details: Date Benefits were checked: 02/02/23 Deductible: $240 ($240 met)/ Coinsurance: 10%/ Admin Fee: 0  Prior Auth: not required PA# Expiration Date:    Pharmacy benefit: Copay $ not covered at [facility] outpatient pharmacy If patient wants fill through the pharmacy benefit please send prescription to:  -- , and include estimated need by date in rx notes. Pharmacy will ship medication directly to the office.  Patient NOT eligible for Prolia Copay Card. Copay Card can make patient's cost as little as $25. Link to apply: [email or web address]://[email or web address].[email or web address]  Call reference # [id number]  ** This summary of benefits is an estimation of the patient's out-of-pocket cost. Exact cost may very based on individual plan coverage.

## 2023-02-05 ENCOUNTER — Other Ambulatory Visit: Payer: Self-pay | Admitting: Internal Medicine

## 2023-02-15 NOTE — Telephone Encounter (Signed)
See additional encounter

## 2023-02-23 ENCOUNTER — Other Ambulatory Visit (INDEPENDENT_AMBULATORY_CARE_PROVIDER_SITE_OTHER): Payer: Medicare Other

## 2023-02-23 DIAGNOSIS — E1169 Type 2 diabetes mellitus with other specified complication: Secondary | ICD-10-CM

## 2023-02-23 DIAGNOSIS — E669 Obesity, unspecified: Secondary | ICD-10-CM | POA: Diagnosis not present

## 2023-02-23 LAB — BASIC METABOLIC PANEL
BUN: 23 mg/dL (ref 6–23)
CO2: 27 mEq/L (ref 19–32)
Calcium: 9.2 mg/dL (ref 8.4–10.5)
Chloride: 107 mEq/L (ref 96–112)
Creatinine, Ser: 1.16 mg/dL (ref 0.40–1.20)
GFR: 45.57 mL/min — ABNORMAL LOW (ref >60.00)
Glucose, Bld: 106 mg/dL — ABNORMAL HIGH (ref 70–99)
Potassium: 4.2 mEq/L (ref 3.5–5.1)
Sodium: 143 mEq/L (ref 135–145)

## 2023-02-23 LAB — HEMOGLOBIN A1C: Hgb A1c MFr Bld: 5.9 % (ref 4.6–6.5)

## 2023-02-28 ENCOUNTER — Ambulatory Visit (INDEPENDENT_AMBULATORY_CARE_PROVIDER_SITE_OTHER): Payer: Medicare Other | Admitting: Endocrinology

## 2023-02-28 VITALS — BP 140/78 | HR 57 | Ht 64.0 in | Wt 172.2 lb

## 2023-02-28 DIAGNOSIS — Z7984 Long term (current) use of oral hypoglycemic drugs: Secondary | ICD-10-CM

## 2023-02-28 DIAGNOSIS — E669 Obesity, unspecified: Secondary | ICD-10-CM | POA: Diagnosis not present

## 2023-02-28 DIAGNOSIS — K30 Functional dyspepsia: Secondary | ICD-10-CM

## 2023-02-28 DIAGNOSIS — E1169 Type 2 diabetes mellitus with other specified complication: Secondary | ICD-10-CM

## 2023-02-28 NOTE — Patient Instructions (Signed)
Have a protein at each meal  Walk 3-4 days a week

## 2023-02-28 NOTE — Progress Notes (Signed)
Patient ID: [name], female   DOB: [birth year], 77 y.o.   MRN: [id number]            Reason for Appointment: Follow up  Primary care physician: [name]  History of Present Illness:      Previously had A1c levels in the prediabetic range and had been at baseline upper normal around 6.0  She was evaluated with a glucose tolerance test in 10/2015 which showed the following: Fasting glucose 122, one-hour glucose 202 hour glucose 147  She was referred to the dietitian for meal planning and advised weight loss  Recent history:  Her A1c has been ranging between 5.7 -6.4 generally and now 6.1   Current medications for diabetes: Jardiance 10 mg daily, started in 11/20  With her having high blood sugars over 125 fasting she was diagnosed with overt diabetes Also has history of background retinopathy   She has been treated with Jardiance 10 mg, intolerant to metformin She is now complaining of episodes of feeling little lightheaded along with nonspecific abdominal discomfort and malaise which sometimes will get better with eating a snack or sometimes just from resting This appears to be happening late morning and occasionally early afternoon She did check her blood pressure during 1 of these episodes and it is not low but has not checked her blood sugar at that time Her blood sugars tend to be the lowest before lunch including 2 readings below 65 in the last 30 days She is checking her sugars at least 4 times a day now Her PCP told her about 2 weeks ago to stop her [place] Pepper but she continues to have these symptoms She is not always having much protein at breakfast and will have oatmeal and egg Her weight is about the same No ex  Last nutritional consultation was in 04/2018  Recent blood sugars from One Touch Verio meter download   PRE-MEAL Fasting Lunch Dinner Bedtime Overall  Glucose range:       Mean/median:        POST-MEAL PC Breakfast PC Lunch PC Dinner  Glucose  range:     Mean/median:        PRE-MEAL Fasting Lunch Dinner Bedtime Overall  Glucose range: 93-111 69-161   69-168  Averages: 100 108 109  115   POST-MEAL PC Breakfast PC Lunch PC Dinner  Glucose range: 94-157  106-162  Averages: 110  132     Weight history:  Wt Readings from Last 3 Encounters:  02/28/23 172 lb 3.2 oz (78.1 kg)  12/28/22 172 lb (78 kg)  12/27/22 170 lb (77.1 kg)    Glycemic levels:   Lab Results  Component Value Date   HGBA1C 5.9 02/23/2023   HGBA1C 6.1 11/15/2022   HGBA1C 6.0 05/24/2022   Lab Results  Component Value Date   MICROALBUR 9.0 (H) 11/24/2022   LDLCALC 26 11/28/2022   CREATININE 1.16 02/23/2023    Other problems discussed today: See review of systems   Allergies as of 02/28/2023       Reactions   Boniva [ibandronate Sodium]    cramp   Calcium Channel Blockers    Upset stomach   Chlorthalidone    Diarrhea per pt   Compazine    Daughter reacts to compazine/pt does not want to take   Lyrica [pregabalin]    Dizzy   Risedronate Sodium Other (See Comments)   Upset stomach   Tape Rash   redness        Medication  List        Accurate as of February 28, 2023 12:19 PM. If you have any questions, ask your nurse or doctor.          amLODipine 5 MG tablet Commonly known as: NORVASC TAKE 1 TABLET (5 MG TOTAL) BY MOUTH DAILY.   aspirin EC 81 MG tablet Take 81 mg by mouth daily.   bimatoprost 0.01 % Soln Commonly known as: LUMIGAN Place 1 drop into both eyes at bedtime.   [employer] Digestive Probiotic 250 MG capsule Generic drug: saccharomyces boulardii TAKE 1 CAPSULE BY MOUTH 2 TIMES DAILY.   Denta 5000 Plus 1.1 % Crea dental cream Generic drug: sodium fluoride Take by mouth as directed.   diphenoxylate-atropine 2.5-0.025 MG tablet Commonly known as: LOMOTIL TAKE 1 TABLET BY MOUTH FOUR TIMES A DAY AS NEEDED FOR DIARRHEA OR LOOSE STOOLS   furosemide 20 MG tablet Commonly known as: LASIX TAKE 0.5-1 TABLETS (10-20 MG  TOTAL) BY MOUTH DAILY AS NEEDED FOR EDEMA (FOR LEG SWELLING).   gabapentin 100 MG capsule Commonly known as: NEURONTIN Take 1 capsule (100 mg total) by mouth 3 (three) times daily.   Jardiance 10 MG Tabs tablet Generic drug: empagliflozin TAKE 1 TABLET (10 MG TOTAL) BY MOUTH DAILY WITH BREAKFAST.   Klor-Con M20 20 MEQ tablet Generic drug: potassium chloride SA TAKE 1 TABLET BY MOUTH EVERY DAY   LORazepam 0.5 MG tablet Commonly known as: ATIVAN Take 1 tablet (0.5 mg total) by mouth 2 (two) times daily as needed for anxiety.   Nascobal 500 MCG/0.1ML Soln Generic drug: Cyanocobalamin USE 1 SPRAY IN 1 NOSTRIL ONCE A WEEK   OneTouch Delica Plus Lancet30G Misc USE AS DIRECTED TO CHECK BLOOD SUGAR IN THE MORNING, AT NOON, AND AT BEDTIME   OneTouch Verio Flex System w/Device Kit 1 each by Does not apply route in the morning, at noon, and at bedtime.   OneTouch Verio test strip Generic drug: glucose blood USE ONETOUCH VERIO TEST STRIPS AS INSTRUCTED TO CHECK BLOOD SUGAR THREE TIMES DAILY.   oxybutynin 5 MG tablet Commonly known as: DITROPAN TAKE 1 TABLET BY MOUTH THREE TIMES A DAY   Prolia 60 MG/ML Sosy injection Generic drug: denosumab Inject 60 mg into the skin every 6 (six) months.   propranolol ER 60 MG 24 hr capsule Commonly known as: INDERAL LA TAKE 1 CAPSULE (60 MG TOTAL) BY MOUTH 2 (TWO) TIMES DAILY.   RABEprazole 20 MG tablet Commonly known as: ACIPHEX TAKE 1 TABLET BY MOUTH EVERY DAY   rosuvastatin 5 MG tablet Commonly known as: CRESTOR TAKE 1 TABLET (5 MG TOTAL) BY MOUTH DAILY.   timolol 0.5 % ophthalmic solution Commonly known as: TIMOPTIC Place 1 drop into both eyes 2 (two) times daily.   triamcinolone cream 0.1 % Commonly known as: KENALOG APPLY TO AFFECTED AREA TWICE A DAY AS NEEDED   Vitamin D3 1.25 MG (50000 UT) Caps TAKE ONE CAPSULE BY MOUTH EVERY 30 DAYS        Allergies:  Allergies  Allergen Reactions   Boniva [Ibandronate Sodium]      cramp   Calcium Channel Blockers     Upset stomach   Chlorthalidone     Diarrhea per pt   Compazine     Daughter reacts to compazine/pt does not want to take   Lyrica [Pregabalin]     Dizzy    Risedronate Sodium Other (See Comments)    Upset stomach   Tape Rash    redness  Past Medical History:  Diagnosis Date   Adenomatous colon polyp 04/08/2011   Anemia    Anxiety    Cataract    Diabetes mellitus without complication (HCC)    "pre-diabetic" per pt   Essential hypertension 08/10/2007   Chronic  On Catapress (per Dr Nicholas Lose) - increase to bid - d/c Hydralazine prn per Dr Graciela Husbands d/c 3/19: Increase Inderal LA to bid   GERD (gastroesophageal reflux disease)    Glaucoma (increased eye pressure)    Heart murmur    HTN (hypertension)    IBS (irritable bowel syndrome)    LBP (low back pain)    Menopause    Osteoporosis    Ovarian cancer (HCC) 09/2008   Dr Janae Sauce   Pancreatic cyst    Vitamin B12 deficiency    Vitamin D deficiency     Past Surgical History:  Procedure Laterality Date   ABDOMINAL HYSTERECTOMY     APPENDECTOMY     BLADDER SURGERY  04/09/2020   tack and sling   CHOLECYSTECTOMY N/A 05/15/2017   Procedure: LAPAROSCOPIC CHOLECYSTECTOMY WITH INTRAOPERATIVE CHOLANGIOGRAM AND LYSIS OF ADHESIONS;  Surgeon: Claud Kelp, MD;  Location: [facility];  Service: General;  Laterality: N/A;   COLONOSCOPY  2018   Dr.Perry   ELBOW FRACTURE SURGERY     age 59- left elbow   HEMORRHOID SURGERY  2001   Ovarian Cancer Debulking  09/2008   POLYPECTOMY      Family History  Problem Relation Age of Onset   Alzheimer's disease Mother 77   Other Mother 74       TAH for excessive bleeding   Lung cancer Father        smoker; metastasis to stomach and other areas   Heart attack Maternal Uncle    Heart disease Maternal Uncle    Other Paternal Aunt        stomach issues   Heart Problems Paternal Uncle    Other Maternal Grandmother        stomach issues; +hysterectomy   Heart  attack Maternal Grandfather    Heart disease Maternal Grandfather    Infertility Daughter    Stomach cancer Cousin        dx. mid-60s   Other Cousin        stomach issues   Leukemia Cousin 18   Stomach cancer Other    Cancer Cousin        unknown type   Other Cousin        stomach issues   Heart Problems Maternal Uncle    Diabetes Paternal Aunt    Heart Problems Paternal Uncle    Emphysema Paternal Uncle        work exposure   Breast cancer Cousin        dx. late 60s-early 70s   Colon cancer Neg Hx    Rectal cancer Neg Hx    Esophageal cancer Neg Hx     Social History:  reports that she has never smoked. She has never used smokeless tobacco. She reports that she does not drink alcohol and does not use drugs.    Review of Systems     Lipid history: She has not been on any statin drugs, lipids followed by her PCP She has been recommended statin drug but she has not been wanting to start However her LDL is now significantly higher than last year   Lab Results  Component Value Date   CHOL 115 11/28/2022   HDL 59.60 11/28/2022  LDLCALC 26 11/28/2022   LDLDIRECT 122.0 05/24/2022   TRIG 149.0 11/28/2022   CHOLHDL 2 11/28/2022         RETINOPATHY: She is followed by ophthalmologist for background retinopathy  Hypertension followed by PCP   She is on amlodipine and Inderal  She does monitor at home   BP Readings from Last 3 Encounters:  02/28/23 (!) 140/78  12/28/22 126/62  11/28/22 132/84   Lab Results  Component Value Date   CREATININE 1.16 02/23/2023   CREATININE 1.14 11/15/2022   CREATININE 1.24 (H) 05/24/2022   HYPOKALEMIA: Has taken potassium supplements  Lab Results  Component Value Date   K 4.2 02/23/2023    Review of Systems  She is complaining of periodic episodes of sharp knifelike pain going around the site from the lower end of her spine to the side of the abdomen and sometimes further Appears that this started last year This can also  happen with certain movements of her back, she says she has had x-rays and scans with at least her urologist and possibly orthopedic surgeon and no diagnosis made, results not available  LABS:  Lab on 02/23/2023  Component Date Value Ref Range Status   Sodium 02/23/2023 143  135 - 145 mEq/L Final   Potassium 02/23/2023 4.2  3.5 - 5.1 mEq/L Final   Chloride 02/23/2023 107  96 - 112 mEq/L Final   CO2 02/23/2023 27  19 - 32 mEq/L Final   Glucose, Bld 02/23/2023 106 (H)  70 - 99 mg/dL Final   BUN 33/29/5188 23  6 - 23 mg/dL Final   Creatinine, Ser 02/23/2023 1.16  0.40 - 1.20 mg/dL Final   GFR 41/66/0630 45.57 (L)  >60.00 mL/min Final   Calculated using the CKD-EPI Creatinine Equation (2021)   Calcium 02/23/2023 9.2  8.4 - 10.5 mg/dL Final   Hgb Z6W MFr Bld 02/23/2023 5.9  4.6 - 6.5 % Final   Glycemic Control Guidelines for People with Diabetes:Non Diabetic:  <6%Goal of Therapy: <7%Additional Action Suggested:  >8%     Physical Examination:  BP (!) 140/78 (BP Location: Left Arm, Patient Position: Sitting, Cuff Size: Normal)   Pulse (!) 57   Ht 5\' 4"  (1.626 m)   Wt 172 lb 3.2 oz (78.1 kg)   SpO2 97%   BMI 29.56 kg/m      ASSESSMENT/PLAN:  DIABETES with mild obesity and history of mild retinopathy:  She has mild diabetes based on fasting blood sugars over 130 previously  A1c is stable at 6.1 Blood sugars are excellent at home and she has been checking more often since she was having symptoms suggestive of known hypoglycemic postprandial syndrome  However will have sporadic slightly low readings, recently 69 at lunch and previously 58 Also this is related to relatively high carbohydrate meals especially at breakfast Until we have avoiding high-fat meals and desserts at least since the last couple of months  Not able to exercise and has gained a little weight Discussed that hypoglycemia symptoms are not related to Jardiance and she can start back on this but try taking it in the  evening at dinnertime  Reactive hypoglycemia symptoms: This is likely related to hyperinsulinemia post high carbohydrate meals and this was discussed in detail Given her details of how to plan her breakfast meal with much more protein and significantly less carbohydrate, also cut back on carbohydrates at lunch about 50% She needs to try and check her sugar when she is symptomatic also  HYPERCHOLESTEROLEMIA: Check lipids today as she has started Crestor   Hypokalemia: Controlled   Renal function: Slightly better and microalbumin is normal  Abdominal radicular symptoms: Not clear if this is related to neuropathy or issues with radiculopathy related to thoracic spine, we will try her on gabapentin empirically and she will follow-up and discuss with her PCP soon  Total visit time including counseling for multiple problems = 40 minutes  Reather Littler 02/28/2023, 12:19 PM   Note: This office note was prepared with Dragon voice recognition system technology. Any transcriptional errors that result from this process are unintentional.

## 2023-03-01 DIAGNOSIS — K30 Functional dyspepsia: Secondary | ICD-10-CM | POA: Insufficient documentation

## 2023-03-01 MED ORDER — ONETOUCH DELICA PLUS LANCET30G MISC: 2 refills | Status: DC

## 2023-03-01 MED ORDER — ONETOUCH VERIO VI STRP: ORAL_STRIP | 1 refills | Status: DC

## 2023-03-16 ENCOUNTER — Telehealth: Payer: Self-pay

## 2023-03-16 NOTE — Patient Outreach (Signed)
  Care Coordination   Initial Visit Note   03/16/2023 Name: [name] MRN: [id number] DOB: Jan 19, 1946  [name] is a 77 y.o. year old female who sees Shepherd, Brandi Quint, MD for primary care. I spoke with  [name] by phone today.  What matters to the patients health and wellness today?  RNCM called to discuss care coordination program. She reports she is awaiting a call from new Endocrinologist to schedule appointment and has Shepherd upcoming appointment with PCP on  03/29/23. She declines participation in the program at this time-Mrs. [name] reports no care coordination or resource needs at this time. [name] to contact [facility] or PCP if care coordination needs in the future.   Goals Addressed             This Visit's Progress    COMPLETED: Care Coordination Activities       Interventions Today    Flowsheet Row Most Recent Value  Chronic Disease   Chronic disease during today's visit Diabetes, Hypertension (HTN)  General Interventions   General Interventions Discussed/Reviewed General Interventions Discussed, Doctor Visits  [Discussed care coordination Program. Provided contact number and encouraged to call RNCM or notify PCP if care coordination needs in the future.]  Doctor Visits Discussed/Reviewed Doctor Visits Discussed  [Reiterated upcoming scheduled appointments. Confirmed patient has transportation]  Pharmacy Interventions   Pharmacy Dicussed/Reviewed Pharmacy Topics Discussed            SDOH assessments and interventions completed:  Yes  SDOH Interventions Today    Flowsheet Row Most Recent Value  SDOH Interventions   Food Insecurity Interventions Intervention Not Indicated  Housing Interventions Intervention Not Indicated  Transportation Interventions Intervention Not Indicated  Utilities Interventions Intervention Not Indicated     Care Coordination Interventions:  Yes, provided   Follow up plan: No further intervention required.    Encounter Outcome:  Pt. Visit Completed   Kathyrn Sheriff, RN, MSN, BSN, CCM [facility] Care Coordinator [phone]

## 2023-03-27 ENCOUNTER — Other Ambulatory Visit: Payer: Self-pay | Admitting: Internal Medicine

## 2023-03-27 DIAGNOSIS — Z0000 Encounter for general adult medical examination without abnormal findings: Secondary | ICD-10-CM

## 2023-03-28 ENCOUNTER — Ambulatory Visit: Payer: Medicare Other

## 2023-03-28 DIAGNOSIS — Z1231 Encounter for screening mammogram for malignant neoplasm of breast: Secondary | ICD-10-CM | POA: Diagnosis not present

## 2023-03-28 DIAGNOSIS — Z0000 Encounter for general adult medical examination without abnormal findings: Secondary | ICD-10-CM

## 2023-03-29 ENCOUNTER — Encounter: Payer: Self-pay | Admitting: Internal Medicine

## 2023-03-29 ENCOUNTER — Ambulatory Visit: Payer: Medicare Other | Admitting: Internal Medicine

## 2023-03-29 VITALS — BP 110/72 | HR 59 | Temp 98.6°F | Ht 64.0 in | Wt 172.0 lb

## 2023-03-29 DIAGNOSIS — R197 Diarrhea, unspecified: Secondary | ICD-10-CM | POA: Diagnosis not present

## 2023-03-29 DIAGNOSIS — M810 Age-related osteoporosis without current pathological fracture: Secondary | ICD-10-CM | POA: Diagnosis not present

## 2023-03-29 DIAGNOSIS — I10 Essential (primary) hypertension: Secondary | ICD-10-CM

## 2023-03-29 DIAGNOSIS — R519 Headache, unspecified: Secondary | ICD-10-CM | POA: Diagnosis not present

## 2023-03-29 DIAGNOSIS — Z6829 Body mass index (BMI) 29.0-29.9, adult: Secondary | ICD-10-CM | POA: Diagnosis not present

## 2023-03-29 DIAGNOSIS — E669 Obesity, unspecified: Secondary | ICD-10-CM | POA: Diagnosis not present

## 2023-03-29 DIAGNOSIS — R42 Dizziness and giddiness: Secondary | ICD-10-CM | POA: Diagnosis not present

## 2023-03-29 DIAGNOSIS — R232 Flushing: Secondary | ICD-10-CM

## 2023-03-29 DIAGNOSIS — C5700 Malignant neoplasm of unspecified fallopian tube: Secondary | ICD-10-CM

## 2023-03-29 DIAGNOSIS — E1169 Type 2 diabetes mellitus with other specified complication: Secondary | ICD-10-CM

## 2023-03-29 DIAGNOSIS — E559 Vitamin D deficiency, unspecified: Secondary | ICD-10-CM

## 2023-03-29 DIAGNOSIS — N3941 Urge incontinence: Secondary | ICD-10-CM

## 2023-03-29 LAB — URINALYSIS
Bilirubin Urine: NEGATIVE
Ketones, ur: NEGATIVE
Leukocytes,Ua: NEGATIVE
Nitrite: NEGATIVE
Specific Gravity, Urine: 1.02 (ref 1.000–1.030)
Total Protein, Urine: NEGATIVE
Urine Glucose: 500 — AB
Urobilinogen, UA: 0.2 (ref 0.0–1.0)
pH: 5.5 (ref 5.0–8.0)

## 2023-03-29 LAB — COMPREHENSIVE METABOLIC PANEL
ALT: 18 U/L (ref 0–35)
AST: 21 U/L (ref 0–37)
Albumin: 4.6 g/dL (ref 3.5–5.2)
Alkaline Phosphatase: 66 U/L (ref 39–117)
BUN: 28 mg/dL — ABNORMAL HIGH (ref 6–23)
CO2: 26 mEq/L (ref 19–32)
Calcium: 9.9 mg/dL (ref 8.4–10.5)
Chloride: 102 mEq/L (ref 96–112)
Creatinine, Ser: 1.25 mg/dL — ABNORMAL HIGH (ref 0.40–1.20)
GFR: 41.64 mL/min — ABNORMAL LOW (ref >60.00)
Glucose, Bld: 107 mg/dL — ABNORMAL HIGH (ref 70–99)
Potassium: 3.9 mEq/L (ref 3.5–5.1)
Sodium: 139 mEq/L (ref 135–145)
Total Bilirubin: 0.6 mg/dL (ref 0.2–1.2)
Total Protein: 7.8 g/dL (ref 6.0–8.3)

## 2023-03-29 LAB — SEDIMENTATION RATE: Sed Rate: 57 mm/hr — ABNORMAL HIGH (ref 0–30)

## 2023-03-29 MED ORDER — BUTALBITAL-APAP-CAFFEINE 50-325-40 MG PO TABS: 1.0000 | ORAL_TABLET | Freq: Four times a day (QID) | ORAL | 0 refills | Status: DC | PRN

## 2023-03-29 MED ORDER — DENOSUMAB 60 MG/ML ~~LOC~~ SOSY
60.0000 mg | PREFILLED_SYRINGE | Freq: Once | SUBCUTANEOUS | Status: AC
Start: 2023-03-29 — End: 2023-03-29
  Administered 2023-03-29: 60 mg via SUBCUTANEOUS

## 2023-03-29 NOTE — Assessment & Plan Note (Signed)
On Vit D 

## 2023-03-29 NOTE — Assessment & Plan Note (Signed)
Metamucil trial

## 2023-03-29 NOTE — Assessment & Plan Note (Signed)
Check BP when feeling normal

## 2023-03-29 NOTE — Assessment & Plan Note (Signed)
On Rx 

## 2023-03-29 NOTE — Assessment & Plan Note (Signed)
Ditropan prn

## 2023-03-29 NOTE — Progress Notes (Signed)
Subjective:  Patient ID: [name], female    DOB: May 24, 1946  Age: 77 y.o. MRN: [id number]  CC: Follow-up (3 MNTH F/U)   HPI RISHITA PETRON presents for osteoporosis, DM, IBS C/o sensations in the head, body. C/o head fullness, episodic 3-4/week. C/o occasional bloating swooshing in the intestines. Dr Marina Goodell in 09/2022   Outpatient Medications Prior to Visit  Medication Sig Dispense Refill   amLODipine (NORVASC) 5 MG tablet TAKE 1 TABLET (5 MG TOTAL) BY MOUTH DAILY. 90 tablet 2   aspirin 81 MG EC tablet Take 81 mg by mouth daily.     bimatoprost (LUMIGAN) 0.01 % SOLN Place 1 drop into both eyes at bedtime.     Blood Glucose Monitoring Suppl (ONETOUCH VERIO FLEX SYSTEM) w/Device KIT 1 each by Does not apply route in the morning, at noon, and at bedtime.      [facility] DIGESTIVE PROBIOTIC 250 MG capsule TAKE 1 CAPSULE BY MOUTH 2 TIMES DAILY. 50 capsule 2   DENTA 5000 PLUS 1.1 % CREA dental cream Take by mouth as directed.     diphenoxylate-atropine (LOMOTIL) 2.5-0.025 MG tablet TAKE 1 TABLET BY MOUTH FOUR TIMES A DAY AS NEEDED FOR DIARRHEA OR LOOSE STOOLS 60 tablet 3   furosemide (LASIX) 20 MG tablet TAKE 0.5-1 TABLETS (10-20 MG TOTAL) BY MOUTH DAILY AS NEEDED FOR EDEMA (FOR LEG SWELLING). 90 tablet 1   glucose blood (ONETOUCH VERIO) test strip Check blood sugar alternating fasting and 2 hours after meals 200 strip 1   JARDIANCE 10 MG TABS tablet TAKE 1 TABLET (10 MG TOTAL) BY MOUTH DAILY WITH BREAKFAST. 90 tablet 2   KLOR-CON M20 20 MEQ tablet TAKE 1 TABLET BY MOUTH EVERY DAY 90 tablet 3   Lancets (ONETOUCH DELICA PLUS LANCET30G) MISC Check blood sugar alternating fasting and 2 hours after meals 200 each 2   LORazepam (ATIVAN) 0.5 MG tablet Take 1 tablet (0.5 mg total) by mouth 2 (two) times daily as needed for anxiety. 60 tablet 2   NASCOBAL 500 MCG/0.1ML SOLN USE 1 SPRAY IN 1 NOSTRIL ONCE A WEEK 12 each 3   oxybutynin (DITROPAN) 5 MG tablet TAKE 1 TABLET BY MOUTH THREE TIMES A DAY  270 tablet 1   PROLIA 60 MG/ML SOSY injection INJECT 1 SYRINGE UNDER THE SKIN ONCE EVERY 6 MONTHS 1 mL 0   propranolol ER (INDERAL LA) 60 MG 24 hr capsule TAKE 1 CAPSULE (60 MG TOTAL) BY MOUTH 2 (TWO) TIMES DAILY. 180 capsule 3   RABEprazole (ACIPHEX) 20 MG tablet TAKE 1 TABLET BY MOUTH EVERY DAY 90 tablet 3   rosuvastatin (CRESTOR) 5 MG tablet TAKE 1 TABLET (5 MG TOTAL) BY MOUTH DAILY. 90 tablet 1   timolol (TIMOPTIC) 0.5 % ophthalmic solution Place 1 drop into both eyes 2 (two) times daily.     triamcinolone cream (KENALOG) 0.1 % APPLY TO AFFECTED AREA TWICE A DAY AS NEEDED     VITAMIN D3 1.25 MG (50000 UT) capsule TAKE ONE CAPSULE BY MOUTH EVERY 30 DAYS 9 capsule 1   gabapentin (NEURONTIN) 100 MG capsule Take 1 capsule (100 mg total) by mouth 3 (three) times daily. (Patient not taking: Reported on 02/28/2023) 90 capsule 3   No facility-administered medications prior to visit.    ROS: Review of Systems  Constitutional:  Negative for activity change, appetite change, chills, fatigue and unexpected weight change.  HENT:  Negative for congestion, mouth sores and sinus pressure.   Eyes:  Negative for visual  disturbance.  Respiratory:  Negative for cough and chest tightness.   Gastrointestinal:  Positive for abdominal distention, diarrhea and nausea. Negative for abdominal pain.  Genitourinary:  Negative for difficulty urinating, frequency and vaginal pain.  Musculoskeletal:  Negative for back pain and gait problem.  Skin:  Negative for pallor and rash.  Neurological:  Positive for light-headedness. Negative for tremors, syncope, weakness, numbness and headaches.  Psychiatric/Behavioral:  Negative for confusion, dysphoric mood, sleep disturbance and suicidal ideas. The patient is nervous/anxious.     Objective:  BP 110/72 (BP Location: Right Arm, Patient Position: Sitting, Cuff Size: Large)   Pulse (!) 59   Temp 98.6 F (37 C) (Oral)   Ht 5\' 4"  (1.626 m)   Wt 172 lb (78 kg)   SpO2 97%    BMI 29.52 kg/m   BP Readings from Last 3 Encounters:  03/29/23 110/72  02/28/23 (!) 140/78  12/28/22 126/62    Wt Readings from Last 3 Encounters:  03/29/23 172 lb (78 kg)  02/28/23 172 lb 3.2 oz (78.1 kg)  12/28/22 172 lb (78 kg)    Physical Exam Constitutional:      General: She is not in acute distress.    Appearance: She is well-developed. She is obese.  HENT:     Head: Normocephalic.     Right Ear: External ear normal.     Left Ear: External ear normal.     Nose: Nose normal.  Eyes:     General:        Right eye: No discharge.        Left eye: No discharge.     Conjunctiva/sclera: Conjunctivae normal.     Pupils: Pupils are equal, round, and reactive to light.  Neck:     Thyroid: No thyromegaly.     Vascular: No JVD.     Trachea: No tracheal deviation.  Cardiovascular:     Rate and Rhythm: Normal rate and regular rhythm.     Heart sounds: Normal heart sounds.  Pulmonary:     Effort: No respiratory distress.     Breath sounds: No stridor. No wheezing.  Abdominal:     General: Bowel sounds are normal. There is no distension.     Palpations: Abdomen is soft. There is no mass.     Tenderness: There is no abdominal tenderness. There is no guarding or rebound.  Musculoskeletal:        General: No tenderness.     Cervical back: Normal range of motion and neck supple. No rigidity.  Lymphadenopathy:     Cervical: No cervical adenopathy.  Skin:    Findings: No erythema or rash.  Neurological:     Cranial Nerves: No cranial nerve deficit.     Motor: No abnormal muscle tone.     Coordination: Coordination normal.     Deep Tendon Reflexes: Reflexes normal.  Psychiatric:        Behavior: Behavior normal.        Thought Content: Thought content normal.        Judgment: Judgment normal.     Lab Results  Component Value Date   WBC 6.4 11/15/2022   HGB 13.1 11/15/2022   HCT 38.6 11/15/2022   PLT 198.0 11/15/2022   GLUCOSE 106 (H) 02/23/2023   CHOL 115  11/28/2022   TRIG 149.0 11/28/2022   HDL 59.60 11/28/2022   LDLDIRECT 122.0 05/24/2022   LDLCALC 26 11/28/2022   ALT 20 11/15/2022   AST 24 11/15/2022   NA  143 02/23/2023   K 4.2 02/23/2023   CL 107 02/23/2023   CREATININE 1.16 02/23/2023   BUN 23 02/23/2023   CO2 27 02/23/2023   TSH 1.61 11/15/2022   HGBA1C 5.9 02/23/2023   MICROALBUR 9.0 (H) 11/24/2022    No results found.  Assessment & Plan:   Problem List Items Addressed This Visit     Vitamin D deficiency - Primary    On Vit D      Osteoporosis    Prolia inj given today      Diarrhea    Metamucil trial      Relevant Orders   Urinalysis   Sedimentation rate   Fallopian tube cancer, carcinoma (HCC)   Relevant Orders   CT HEAD WO CONTRAST ( )   Dizziness, nonspecific   Relevant Orders   Urinalysis   Comprehensive metabolic panel   Sedimentation rate   Hot flashes    Resolved       HTN (hypertension)    Check BP when feeling normal      Type 2 diabetes mellitus with obesity (HCC)    On Rx      Urge incontinence    Ditropan prn      Other Visit Diagnoses     Nonintractable episodic headache, unspecified headache type       Relevant Medications   butalbital-acetaminophen-caffeine (FIORICET) 50-325-40 MG tablet   Other Relevant Orders   CT HEAD WO CONTRAST ( )   Sedimentation rate         Meds ordered this encounter  Medications   denosumab (PROLIA) injection 60 mg    Order Specific Question:   Patient is enrolled in [facility] program for this medication and I have provided a copy of the Prolia Medication Guide and Patient Brochure.    Answer:   No    Order Specific Question:   I have reviewed with the patient the information in the Prolia Medication Guide and Patient Counseling Chart including the serious risks of Prolia and symptoms of each risk.    Answer:   No    Order Specific Question:   I have advised the patient to seek medical attention if they have signs or symptoms of any of  the serious risks.    Answer:   No   butalbital-acetaminophen-caffeine (FIORICET) 50-325-40 MG tablet    Sig: Take 1 tablet by mouth every 6 (six) hours as needed for headache.    Dispense:  60 tablet    Refill:  0      Follow-up: Return in about 3 months (around 06/29/2023) for a follow-up visit.  Sonda Primes, MD

## 2023-03-29 NOTE — Assessment & Plan Note (Signed)
Resolved

## 2023-03-29 NOTE — Assessment & Plan Note (Signed)
Prolia inj given today

## 2023-03-31 ENCOUNTER — Ambulatory Visit
Admission: RE | Admit: 2023-03-31 | Discharge: 2023-03-31 | Disposition: A | Discharge status: Home / Self Care | Payer: Medicare Other | Source: Ambulatory Visit | Attending: Internal Medicine | Admitting: Internal Medicine

## 2023-03-31 DIAGNOSIS — R519 Headache, unspecified: Secondary | ICD-10-CM | POA: Diagnosis not present

## 2023-03-31 DIAGNOSIS — C5700 Malignant neoplasm of unspecified fallopian tube: Secondary | ICD-10-CM

## 2023-05-30 ENCOUNTER — Other Ambulatory Visit: Payer: Self-pay | Admitting: Internal Medicine

## 2023-06-09 ENCOUNTER — Ambulatory Visit
Admission: EM | Admit: 2023-06-09 | Discharge: 2023-06-09 | Disposition: A | Discharge status: Home / Self Care | Payer: Medicare Other | Attending: Internal Medicine | Admitting: Internal Medicine

## 2023-06-09 DIAGNOSIS — H6993 Unspecified Eustachian tube disorder, bilateral: Secondary | ICD-10-CM

## 2023-06-09 MED ORDER — FLUTICASONE PROPIONATE 50 MCG/ACT NA SUSP
1.0000 | Freq: Every day | NASAL | 0 refills | Status: AC
Start: 2023-06-09 — End: ?

## 2023-06-09 NOTE — ED Provider Notes (Signed)
[facility]    CSN: [id number] Arrival date & time: 06/09/23  1120      History   Chief Complaint Chief Complaint  Patient presents with   Ear Pain    HPI [name] is a 77 y.o. female presents for ear fullness/warm feeling in her years.  Patient reports 4 to 5 days of bilateral ear fullness with a "warm" feeling in her ears. Denies pain, fevers, URI symptoms or drainage from the ears.  She does use Q-tips.  She has been trying Sudafed, warm compresses and chewing gum.  No other concerns at this time.  HPI  Past Medical History:  Diagnosis Date   Adenomatous colon polyp 04/08/2011   Anemia    Anxiety    Cataract    Diabetes mellitus without complication (HCC)    "pre-diabetic" per pt   Essential hypertension 08/10/2007   Chronic  On Catapress (per Dr Nicholas Lose) - increase to bid - d/c Hydralazine prn per Dr Graciela Husbands d/c 3/19: Increase Inderal LA to bid   GERD (gastroesophageal reflux disease)    Glaucoma (increased eye pressure)    Heart murmur    HTN (hypertension)    IBS (irritable bowel syndrome)    LBP (low back pain)    Menopause    Osteoporosis    Ovarian cancer (HCC) 09/2008   Dr Janae Sauce   Pancreatic cyst    Vitamin B12 deficiency    Vitamin D deficiency     Patient Active Problem List   Diagnosis Date Noted   Postprandial distress syndrome 03/01/2023   Atherosclerosis of aorta (HCC) 02/03/2022   Iliopsoas bursitis 01/24/2022   RLQ abdominal pain 01/24/2022   History of prolapse of bladder 01/04/2022   Increased frequency of urination 01/04/2022   Nocturia 01/04/2022   OAB (overactive bladder) 10/20/2021   History of kidney stones 07/09/2021   Urge incontinence 07/01/2021   Urinary urgency 07/01/2021   Pelvic prolapse 05/13/2020   Type 2 diabetes mellitus with obesity (HCC) 05/13/2020   Dupuytren contracture 05/13/2020   Foot pain, right 11/07/2019   Knee pain 04/22/2019   Shingles 02/22/2019   Hair loss 07/16/2018   Physical  deconditioning 04/18/2018   HTN (hypertension) 04/18/2018   Abnormal chest sounds 03/26/2018   Bilateral high frequency sensorineural hearing loss 02/20/2018   Subjective tinnitus, left 02/20/2018   Rhinitis 01/17/2018   Hot flashes 11/21/2017   Panic attacks 11/01/2017   Cholecystitis 04/25/2017   Abdominal pain, chronic, epigastric 04/11/2017   Anemia 12/13/2016   Near syncope 11/16/2016   Hypokalemia 05/18/2016   Hematochezia 10/20/2015   Genetic testing 05/04/2015   History of colonic polyps 05/04/2015   Family history of stomach cancer 05/04/2015   Dense breast tissue 03/28/2015   Flank pain 12/22/2014   Well adult exam 11/12/2014   IBS (irritable bowel syndrome) 07/09/2014   LLQ abdominal pain 07/07/2014   Obstipation 06/20/2014   Dizziness, nonspecific 02/24/2014   Fallopian tube cancer, carcinoma (HCC) 02/03/2014   Diarrhea 01/20/2014   Left groin pain 10/07/2013   Piriformis syndrome of left side 09/23/2013   Leg length discrepancy 09/23/2013   Osteoporosis 02/05/2013   Hyperglycemia 02/05/2013   Dyslipidemia 02/05/2013   Paresthesia of both legs 12/03/2010   Paresthesia of hand 12/03/2010   ACUTE BRONCHITIS 08/27/2009   Neoplasm of digestive system 09/25/2008   Abdominal pain 09/22/2008   WARTS, VIRAL, UNSPECIFIED 11/12/2007   Vitamin D deficiency 11/12/2007   GERD 11/12/2007   HIP PAIN 08/10/2007   LOW  BACK PAIN 08/10/2007   B12 deficiency 05/31/2007    Past Surgical History:  Procedure Laterality Date   ABDOMINAL HYSTERECTOMY     APPENDECTOMY     BLADDER SURGERY  04/09/2020   tack and sling   CHOLECYSTECTOMY N/A 05/15/2017   Procedure: LAPAROSCOPIC CHOLECYSTECTOMY WITH INTRAOPERATIVE CHOLANGIOGRAM AND LYSIS OF ADHESIONS;  Surgeon: Claud Kelp, MD;  Location: [facility];  Service: General;  Laterality: N/A;   COLONOSCOPY  2018   Dr.Perry   ELBOW FRACTURE SURGERY     age 89- left elbow   HEMORRHOID SURGERY  2001   Ovarian Cancer Debulking  09/2008    POLYPECTOMY      OB History   No obstetric history on file.      Home Medications    Prior to Admission medications   Medication Sig Start Date End Date Taking? Authorizing Provider  fluticasone (FLONASE) 50 MCG/ACT nasal spray Place 1 spray into both nostrils daily. 06/09/23  Yes Radford Pax, NP  amLODipine (NORVASC) 5 MG tablet TAKE 1 TABLET (5 MG TOTAL) BY MOUTH DAILY. 02/06/23   [name], [name], MD  aspirin 81 MG EC tablet Take 81 mg by mouth daily.    [provider]  bimatoprost (LUMIGAN) 0.01 % SOLN Place 1 drop into both eyes at bedtime.    [provider]  Blood Glucose Monitoring Suppl (ONETOUCH VERIO FLEX SYSTEM) w/Device KIT 1 each by Does not apply route in the morning, at noon, and at bedtime.     [provider]  butalbital-acetaminophen-caffeine (FIORICET) 50-325-40 MG tablet Take 1 tablet by mouth every 6 (six) hours as needed for headache. 03/29/23   [name], [name], MD  [employer] DIGESTIVE PROBIOTIC 250 MG capsule TAKE 1 CAPSULE BY MOUTH 2 TIMES DAILY. 11/28/21   [name], [name], MD  DENTA 5000 PLUS 1.1 % CREA dental cream Take by mouth as directed. 01/13/22   [provider]  diphenoxylate-atropine (LOMOTIL) 2.5-0.025 MG tablet TAKE 1 TABLET BY MOUTH FOUR TIMES A DAY AS NEEDED FOR DIARRHEA OR LOOSE STOOLS 12/21/22   [name], [name], MD  furosemide (LASIX) 20 MG tablet TAKE 0.5-1 TABLETS (10-20 MG TOTAL) BY MOUTH DAILY AS NEEDED FOR EDEMA (FOR LEG SWELLING). 04/29/22   [name], [name], MD  gabapentin (NEURONTIN) 100 MG capsule Take 1 capsule (100 mg total) by mouth 3 (three) times daily. Patient not taking: Reported on 02/28/2023 11/28/22   Reather Littler, MD  glucose blood [facility] VERIO) test strip Check blood sugar alternating fasting and 2 hours after meals 03/01/23   Reather Littler, MD  JARDIANCE 10 MG TABS tablet TAKE 1 TABLET (10 MG TOTAL) BY MOUTH DAILY WITH BREAKFAST. 10/10/22   Reather Littler, MD  KLOR-CON M20 20 MEQ tablet  TAKE 1 TABLET BY MOUTH EVERY DAY 05/30/22   [name], [name], MD  Lancets [facility] DELICA PLUS LANCET30G) MISC Check blood sugar alternating fasting and 2 hours after meals 03/01/23   Reather Littler, MD  LORazepam (ATIVAN) 0.5 MG tablet Take 1 tablet (0.5 mg total) by mouth 2 (two) times daily as needed for anxiety. 12/28/22   [name], [name], MD  NASCOBAL 500 MCG/0.1ML SOLN USE 1 SPRAY IN 1 NOSTRIL ONCE A WEEK 12/13/22   [name], [name], MD  oxybutynin (DITROPAN) 5 MG tablet TAKE 1 TABLET BY MOUTH THREE TIMES A DAY 01/08/23   [name], [name], MD  PROLIA 60 MG/ML SOSY injection INJECT 1 SYRINGE UNDER THE SKIN ONCE EVERY 6 MONTHS 03/03/23  [name], [name], MD  propranolol ER (INDERAL LA) 60 MG 24 hr capsule TAKE 1 CAPSULE BY MOUTH 2 TIMES DAILY. 05/30/23   [name], [name], MD  RABEprazole (ACIPHEX) 20 MG tablet TAKE 1 TABLET BY MOUTH EVERY DAY 10/10/22   [name], [name], MD  rosuvastatin (CRESTOR) 5 MG tablet TAKE 1 TABLET (5 MG TOTAL) BY MOUTH DAILY. 08/19/22   Reather Littler, MD  timolol (TIMOPTIC) 0.5 % ophthalmic solution Place 1 drop into both eyes 2 (two) times daily. 11/16/20   [provider]  triamcinolone cream (KENALOG) 0.1 % APPLY TO AFFECTED AREA TWICE A DAY AS NEEDED    [provider]  VITAMIN D3 1.25 MG (50000 UT) capsule TAKE ONE CAPSULE BY MOUTH EVERY 30 DAYS 10/25/22   [name], [name], MD    Family History Family History  Problem Relation Age of Onset   Alzheimer's disease Mother 53   Other Mother 20       TAH for excessive bleeding   Lung cancer Father        smoker; metastasis to stomach and other areas   Heart attack Maternal Uncle    Heart disease Maternal Uncle    Other Paternal Aunt        stomach issues   Heart Problems Paternal Uncle    Other Maternal Grandmother        stomach issues; +hysterectomy   Heart attack Maternal Grandfather    Heart disease Maternal Grandfather    Infertility Daughter    Stomach cancer Cousin         dx. mid-60s   Other Cousin        stomach issues   Leukemia Cousin 18   Stomach cancer Other    Cancer Cousin        unknown type   Other Cousin        stomach issues   Heart Problems Maternal Uncle    Diabetes Paternal Aunt    Heart Problems Paternal Uncle    Emphysema Paternal Uncle        work exposure   Breast cancer Cousin        dx. late 60s-early 70s   Colon cancer Neg Hx    Rectal cancer Neg Hx    Esophageal cancer Neg Hx     Social History Social History   Tobacco Use   Smoking status: Never   Smokeless tobacco: Never  Vaping Use   Vaping status: Never Used  Substance Use Topics   Alcohol use: No    Alcohol/week: 0.0 standard drinks of alcohol   Drug use: No     Allergies   Boniva [ibandronate sodium], Calcium channel blockers, Chlorthalidone, Compazine, Hyoscyamine, Lyrica [pregabalin], Risedronate sodium, and Tape   Review of Systems Review of Systems  HENT:         Ear fullness     Physical Exam Triage Vital Signs ED Triage Vitals  Encounter Vitals Group     BP 06/09/23 1212 (!) 168/78     Systolic BP Percentile --      Diastolic BP Percentile --      Pulse Rate 06/09/23 1212 (!) 57     Resp 06/09/23 1212 19     Temp 06/09/23 1212 97.9 F (36.6 C)     Temp Source 06/09/23 1212 Oral     SpO2 06/09/23 1212 98 %     Weight --      Height --      Head Circumference --  Peak Flow --      Pain Score 06/09/23 1211 6     Pain Loc --      Pain Education --      Exclude from Growth Chart --    No data found.  Updated Vital Signs BP (!) 168/78 (BP Location: Right Arm)   Pulse (!) 57   Temp 97.9 F (36.6 C) (Oral)   Resp 19   SpO2 98%   Visual Acuity Right Eye Distance:   Left Eye Distance:   Bilateral Distance:    Right Eye Near:   Left Eye Near:    Bilateral Near:     Physical Exam Vitals and nursing note reviewed.  Constitutional:      General: She is not in acute distress.    Appearance: Normal appearance. She  is well-developed. She is not ill-appearing.  HENT:     Head: Normocephalic and atraumatic.     Right Ear: Ear canal normal. No drainage or swelling. A middle ear effusion is present. No mastoid tenderness. Tympanic membrane is not erythematous.     Left Ear: Ear canal normal. No drainage or swelling. A middle ear effusion is present. No mastoid tenderness. Tympanic membrane is not erythematous.     Mouth/Throat:     Mouth: Mucous membranes are moist.     Pharynx: Oropharynx is clear. Uvula midline.     Tonsils: No tonsillar exudate or tonsillar abscesses.  Eyes:     Conjunctiva/sclera: Conjunctivae normal.     Pupils: Pupils are equal, round, and reactive to light.  Cardiovascular:     Rate and Rhythm: Normal rate.  Pulmonary:     Effort: Pulmonary effort is normal.  Musculoskeletal:     Cervical back: Normal range of motion and neck supple.  Lymphadenopathy:     Cervical: No cervical adenopathy.  Skin:    General: Skin is warm and dry.  Neurological:     General: No focal deficit present.     Mental Status: She is alert and oriented to person, place, and time.  Psychiatric:        Mood and Affect: Mood normal.        Behavior: Behavior normal.      UC Treatments / Results  Labs (all labs ordered are listed, but only abnormal results are displayed) Labs Reviewed - No data to display  EKG   Radiology No results found.  Procedures Procedures (including critical care time)  Medications Ordered in UC Medications - No data to display  Initial Impression / Assessment and Plan / UC Course  I have reviewed the triage vital signs and the nursing notes.  Pertinent labs & imaging results that were available during my care of the patient were reviewed by me and considered in my medical decision making (see chart for details).     Reviewed exam and symptoms with patient.  No red flags.  Discussed eustachian tube dysfunction.  Start Flonase and OTC allergy medicine.   Advised PCP follow-up if symptoms do not improve.  ER precautions reviewed and patient verbalized understanding. Final Clinical Impressions(s) / UC Diagnoses   Final diagnoses:  Dysfunction of both eustachian tubes     Discharge Instructions      Start Flonase nasal spray daily.  Also start an over-the-counter allergy medicine such as Claritin or Zyrtec every day for at least 2 weeks.  Please follow-up with your PCP if your symptoms or not improving.  Please go to the emergency room if you  develop any worsening symptoms.  I hope you feel better soon!    ED Prescriptions     Medication Sig Dispense Auth. Provider   fluticasone (FLONASE) 50 MCG/ACT nasal spray Place 1 spray into both nostrils daily. 15.8 mL Radford Pax, NP      PDMP not reviewed this encounter.   Radford Pax, NP 06/09/23 1249

## 2023-06-09 NOTE — ED Triage Notes (Signed)
Pt presents with c/o a a warm feeling inside of her ears. States she feels water in her ears.   Has tried hot and cold compresses, chewing gum. C/o ear discomfort.

## 2023-06-09 NOTE — Discharge Instructions (Signed)
Start Flonase nasal spray daily.  Also start an over-the-counter allergy medicine such as Claritin or Zyrtec every day for at least 2 weeks.  Please follow-up with your PCP if your symptoms or not improving.  Please go to the emergency room if you develop any worsening symptoms.  I hope you feel better soon!

## 2023-06-20 DIAGNOSIS — H0100A Unspecified blepharitis right eye, upper and lower eyelids: Secondary | ICD-10-CM | POA: Diagnosis not present

## 2023-06-20 DIAGNOSIS — H04123 Dry eye syndrome of bilateral lacrimal glands: Secondary | ICD-10-CM | POA: Diagnosis not present

## 2023-06-20 DIAGNOSIS — H40023 Open angle with borderline findings, high risk, bilateral: Secondary | ICD-10-CM | POA: Diagnosis not present

## 2023-06-28 ENCOUNTER — Ambulatory Visit: Payer: Medicare Other | Admitting: Internal Medicine

## 2023-06-28 VITALS — BP 122/78 | HR 63 | Temp 98.6°F | Ht 64.0 in | Wt 174.0 lb

## 2023-06-28 DIAGNOSIS — G8929 Other chronic pain: Secondary | ICD-10-CM | POA: Diagnosis not present

## 2023-06-28 DIAGNOSIS — I10 Essential (primary) hypertension: Secondary | ICD-10-CM | POA: Diagnosis not present

## 2023-06-28 DIAGNOSIS — E559 Vitamin D deficiency, unspecified: Secondary | ICD-10-CM | POA: Diagnosis not present

## 2023-06-28 DIAGNOSIS — Z23 Encounter for immunization: Secondary | ICD-10-CM | POA: Diagnosis not present

## 2023-06-28 DIAGNOSIS — E538 Deficiency of other specified B group vitamins: Secondary | ICD-10-CM | POA: Diagnosis not present

## 2023-06-28 DIAGNOSIS — Z8543 Personal history of malignant neoplasm of ovary: Secondary | ICD-10-CM

## 2023-06-28 DIAGNOSIS — M545 Low back pain, unspecified: Secondary | ICD-10-CM | POA: Diagnosis not present

## 2023-06-28 MED ORDER — METHOCARBAMOL 500 MG PO TABS
500.0000 mg | ORAL_TABLET | Freq: Three times a day (TID) | ORAL | 1 refills | Status: AC | PRN
Start: 2023-06-28 — End: ?

## 2023-06-28 NOTE — Assessment & Plan Note (Signed)
Increase Inderal LA to bid

## 2023-06-28 NOTE — Assessment & Plan Note (Signed)
GI appt w/Dr Marina Goodell - pending - R abd complaints Abd CT ok 1 year ago

## 2023-06-28 NOTE — Assessment & Plan Note (Signed)
On nasal B12 -- [place]

## 2023-06-28 NOTE — Assessment & Plan Note (Addendum)
Chronic - worse OA/ spinal stenosis Referral to Ortho, spine MRI discussed

## 2023-06-28 NOTE — Assessment & Plan Note (Signed)
Check BP when feeling normal

## 2023-06-28 NOTE — Progress Notes (Signed)
Subjective:  Patient ID: [name], female    DOB: [birth year]  Age: 77 y.o. MRN: [id number]  CC: Follow-up (3 mnth f/u, Pt states she is still having pain in her rt rib/back area. Pt is also having pain with ROM of rt arm and has unusual swelling in rt arm. Pt is also still having discomfort in her rt ear.)   HPI [name] presents for 3 mnth f/u, Pt states she is still having pain in her rt rib/back area. Pt is also having pain with ROM of rt arm and has unusual swelling in rt arm. Pt is also still having discomfort in her rt ear.  R handed   Outpatient Medications Prior to Visit  Medication Sig Dispense Refill   amLODipine (NORVASC) 5 MG tablet TAKE 1 TABLET (5 MG TOTAL) BY MOUTH DAILY. 90 tablet 2   aspirin 81 MG EC tablet Take 81 mg by mouth daily.     bimatoprost (LUMIGAN) 0.01 % SOLN Place 1 drop into both eyes at bedtime.     Blood Glucose Monitoring Suppl (ONETOUCH VERIO FLEX SYSTEM) w/Device KIT 1 each by Does not apply route in the morning, at noon, and at bedtime.      butalbital-acetaminophen-caffeine (FIORICET) 50-325-40 MG tablet Take 1 tablet by mouth every 6 (six) hours as needed for headache. 60 tablet 0   [facility] DIGESTIVE PROBIOTIC 250 MG capsule TAKE 1 CAPSULE BY MOUTH 2 TIMES DAILY. 50 capsule 2   DENTA 5000 PLUS 1.1 % CREA dental cream Take by mouth as directed.     diphenoxylate-atropine (LOMOTIL) 2.5-0.025 MG tablet TAKE 1 TABLET BY MOUTH FOUR TIMES A DAY AS NEEDED FOR DIARRHEA OR LOOSE STOOLS 60 tablet 3   fluticasone (FLONASE) 50 MCG/ACT nasal spray Place 1 spray into both nostrils daily. 15.8 mL 0   furosemide (LASIX) 20 MG tablet TAKE 0.5-1 TABLETS (10-20 MG TOTAL) BY MOUTH DAILY AS NEEDED FOR EDEMA (FOR LEG SWELLING). 90 tablet 1   glucose blood (ONETOUCH VERIO) test strip Check blood sugar alternating fasting and 2 hours after meals 200 strip 1   JARDIANCE 10 MG TABS tablet TAKE 1 TABLET (10 MG TOTAL) BY MOUTH DAILY WITH BREAKFAST. 90 tablet 2    KLOR-CON M20 20 MEQ tablet TAKE 1 TABLET BY MOUTH EVERY DAY 90 tablet 3   Lancets (ONETOUCH DELICA PLUS LANCET30G) MISC Check blood sugar alternating fasting and 2 hours after meals 200 each 2   LORazepam (ATIVAN) 0.5 MG tablet Take 1 tablet (0.5 mg total) by mouth 2 (two) times daily as needed for anxiety. 60 tablet 2   NASCOBAL 500 MCG/0.1ML SOLN USE 1 SPRAY IN 1 NOSTRIL ONCE A WEEK 12 each 3   oxybutynin (DITROPAN) 5 MG tablet TAKE 1 TABLET BY MOUTH THREE TIMES A DAY 270 tablet 1   PROLIA 60 MG/ML SOSY injection INJECT 1 SYRINGE UNDER THE SKIN ONCE EVERY 6 MONTHS 1 mL 0   propranolol ER (INDERAL LA) 60 MG 24 hr capsule TAKE 1 CAPSULE BY MOUTH 2 TIMES DAILY. 180 capsule 3   RABEprazole (ACIPHEX) 20 MG tablet TAKE 1 TABLET BY MOUTH EVERY DAY 90 tablet 3   rosuvastatin (CRESTOR) 5 MG tablet TAKE 1 TABLET (5 MG TOTAL) BY MOUTH DAILY. 90 tablet 1   timolol (TIMOPTIC) 0.5 % ophthalmic solution Place 1 drop into both eyes 2 (two) times daily.     triamcinolone cream (KENALOG) 0.1 % APPLY TO AFFECTED AREA TWICE A DAY AS NEEDED  VITAMIN D3 1.25 MG (50000 UT) capsule TAKE ONE CAPSULE BY MOUTH EVERY 30 DAYS 9 capsule 1   gabapentin (NEURONTIN) 100 MG capsule Take 1 capsule (100 mg total) by mouth 3 (three) times daily. (Patient not taking: Reported on 02/28/2023) 90 capsule 3   No facility-administered medications prior to visit.    ROS: Review of Systems  Constitutional:  Positive for fatigue. Negative for activity change, appetite change, chills and unexpected weight change.  HENT:  Negative for congestion, mouth sores and sinus pressure.   Eyes:  Negative for visual disturbance.  Respiratory:  Negative for cough and chest tightness.   Gastrointestinal:  Negative for abdominal pain and nausea.  Genitourinary:  Negative for difficulty urinating, frequency and vaginal pain.  Musculoskeletal:  Positive for arthralgias, back pain and neck pain. Negative for gait problem and neck stiffness.  Skin:   Negative for pallor and rash.  Neurological:  Negative for dizziness, tremors, weakness, numbness and headaches.  Hematological:  Bruises/bleeds easily.  Psychiatric/Behavioral:  Negative for confusion and sleep disturbance.     Objective:  BP 122/78 (BP Location: Left Arm, Patient Position: Sitting, Cuff Size: Normal)   Pulse 63   Temp 98.6 F (37 C) (Oral)   Ht 5\' 4"  (1.626 m)   Wt 174 lb (78.9 kg)   SpO2 97%   BMI 29.87 kg/m   BP Readings from Last 3 Encounters:  06/28/23 122/78  06/09/23 (!) 168/78  03/29/23 110/72    Wt Readings from Last 3 Encounters:  06/28/23 174 lb (78.9 kg)  03/29/23 172 lb (78 kg)  02/28/23 172 lb 3.2 oz (78.1 kg)    Physical Exam Constitutional:      General: She is not in acute distress.    Appearance: She is well-developed.  HENT:     Head: Normocephalic.     Right Ear: External ear normal.     Left Ear: External ear normal.     Nose: Nose normal.  Eyes:     General:        Right eye: No discharge.        Left eye: No discharge.     Conjunctiva/sclera: Conjunctivae normal.     Pupils: Pupils are equal, round, and reactive to light.  Neck:     Thyroid: No thyromegaly.     Vascular: No JVD.     Trachea: No tracheal deviation.  Cardiovascular:     Rate and Rhythm: Normal rate and regular rhythm.     Heart sounds: Normal heart sounds.  Pulmonary:     Effort: No respiratory distress.     Breath sounds: No stridor. No wheezing.  Abdominal:     General: Bowel sounds are normal. There is no distension.     Palpations: Abdomen is soft. There is no mass.     Tenderness: There is no abdominal tenderness. There is no guarding or rebound.  Musculoskeletal:        General: Tenderness present.     Cervical back: Normal range of motion and neck supple. No rigidity.     Right lower leg: No edema.     Left lower leg: No edema.  Lymphadenopathy:     Cervical: No cervical adenopathy.  Skin:    Findings: No erythema or rash.  Neurological:      Mental Status: She is oriented to person, place, and time.     Cranial Nerves: No cranial nerve deficit.     Motor: No abnormal muscle tone.  Coordination: Coordination normal.     Deep Tendon Reflexes: Reflexes normal.  Psychiatric:        Behavior: Behavior normal.        Thought Content: Thought content normal.        Judgment: Judgment normal.     Lab Results  Component Value Date   WBC 6.4 11/15/2022   HGB 13.1 11/15/2022   HCT 38.6 11/15/2022   PLT 198.0 11/15/2022   GLUCOSE 107 (H) 03/29/2023   CHOL 115 11/28/2022   TRIG 149.0 11/28/2022   HDL 59.60 11/28/2022   LDLDIRECT 122.0 05/24/2022   LDLCALC 26 11/28/2022   ALT 18 03/29/2023   AST 21 03/29/2023   NA 139 03/29/2023   K 3.9 03/29/2023   CL 102 03/29/2023   CREATININE 1.25 (H) 03/29/2023   BUN 28 (H) 03/29/2023   CO2 26 03/29/2023   TSH 1.61 11/15/2022   HGBA1C 5.9 02/23/2023   MICROALBUR 9.0 (H) 11/24/2022    No results found.  Assessment & Plan:   Problem List Items Addressed This Visit     B12 deficiency - Primary    On nasal B12 -- [place]      Vitamin D deficiency    On Vit D      RESOLVED: Essential hypertension     Increase Inderal LA to bid      LOW BACK PAIN    Chronic - worse OA/ spinal stenosis Referral to Ortho, spine MRI discussed         Relevant Medications   methocarbamol (ROBAXIN) 500 MG tablet   NEOPLASM, MALIGNANT, OVARY, HX OF    GI appt w/Dr Marina Goodell - pending - R abd complaints Abd CT ok 1 year ago      HTN (hypertension)    Check BP when feeling normal         Meds ordered this encounter  Medications   methocarbamol (ROBAXIN) 500 MG tablet    Sig: Take 1 tablet (500 mg total) by mouth every 8 (eight) hours as needed for muscle spasms.    Dispense:  90 tablet    Refill:  1      Follow-up: Return in about 3 months (around 09/28/2023) for a follow-up visit.  Sonda Primes, MD

## 2023-06-28 NOTE — Assessment & Plan Note (Signed)
On Vit D 

## 2023-06-28 NOTE — Addendum Note (Signed)
Addended by: Delsa Grana R on: 06/28/2023 02:50 PM   Modules accepted: Orders

## 2023-07-03 ENCOUNTER — Ambulatory Visit (INDEPENDENT_AMBULATORY_CARE_PROVIDER_SITE_OTHER): Payer: Medicare Other | Admitting: Endocrinology

## 2023-07-03 ENCOUNTER — Encounter: Payer: Self-pay | Admitting: Endocrinology

## 2023-07-03 VITALS — BP 154/72 | HR 66 | Resp 20 | Ht 64.0 in | Wt 175.2 lb

## 2023-07-03 DIAGNOSIS — E1169 Type 2 diabetes mellitus with other specified complication: Secondary | ICD-10-CM | POA: Diagnosis not present

## 2023-07-03 DIAGNOSIS — E669 Obesity, unspecified: Secondary | ICD-10-CM | POA: Diagnosis not present

## 2023-07-03 DIAGNOSIS — Z7984 Long term (current) use of oral hypoglycemic drugs: Secondary | ICD-10-CM

## 2023-07-03 LAB — POCT GLYCOSYLATED HEMOGLOBIN (HGB A1C): Hemoglobin A1C: 5.5 % (ref 4.0–5.6)

## 2023-07-03 MED ORDER — EMPAGLIFLOZIN 10 MG PO TABS: 10.0000 mg | ORAL_TABLET | Freq: Every day | ORAL | 3 refills | Status: DC

## 2023-07-03 NOTE — Progress Notes (Signed)
Outpatient Endocrinology Note Brandi Dell Briner, MD  07/03/23  Patient's Name: [name]    DOB: September 01, 1946    MRN: [id number]                                                    REASON OF VISIT: Follow up of type 2 diabetes mellitus  PCP: [name], [name], MD  HISTORY OF PRESENT ILLNESS:   [name] is a 77 y.o. old female with past medical history listed below, is here for follow up for type 2 diabetes mellitus.   Pertinent Diabetes History: Patient has type 2 diabetes mellitus, mainly based on fasting blood sugar more than 130 in the past.  She initially had prediabetes with hemoglobin A1c in the range of 5.7 to 6.4%.  She had seen dietitian in the past.  Chronic Diabetes Complications : Retinopathy: yes. Last ophthalmology exam was done on annually, following with ophthalmology regularly.  Nephropathy: CKD IIIa, following with nephrology. Peripheral neuropathy: yes Coronary artery disease: no Stroke: no  Relevant comorbidities and cardiovascular risk factors: Obesity: yes Body mass index is 30.07 kg/m.  Hypertension: Yes  Hyperlipidemia : Yes, on statin   Current / Home Diabetic regimen includes: Jardiance 10 mg daily.  Restarted in November 2020.  Prior diabetic medications: She was intolerant to metformin.  Glycemic data:   She has One Touch Verio Flex glucometer.  Blood sugar in last 14 days October 14 to October 28 reviewed.  Lowest blood sugar 77, highest blood sugar 152, average blood sugar 117.  She has been taking daily in the morning fasting and other times of the day in the afternoon and in the evening.  Some of the blood sugar in the morning fasting 98, 100, 110, in the afternoon 149, 152, 145, 91, 120, 99.  Hypoglycemia: Patient has no hypoglycemic episodes. Patient has hypoglycemia awareness.  Factors modifying glucose control: 1.  Diabetic diet assessment: 2-3 meals a day.  2.  Staying active or exercising: No formal exercise.  3.   Medication compliance: compliant all of the time.  Interval history  Glucometer data as reviewed above.  She reports complaints of occasional hypoglycemic symptoms however lowest blood sugar 77.  She has been well hydrating.  She has mild numbness of the feet otherwise no complaints today.  Patient is accompanied by her husband in the clinic today.  REVIEW OF SYSTEMS As per history of present illness.   PAST MEDICAL HISTORY: Past Medical History:  Diagnosis Date   Adenomatous colon polyp 04/08/2011   Anemia    Anxiety    Cataract    Diabetes mellitus without complication (HCC)    "pre-diabetic" per pt   Essential hypertension 08/10/2007   Chronic  On Catapress (per Dr Nicholas Lose) - increase to bid - d/c Hydralazine prn per Dr Graciela Husbands d/c 3/19: Increase Inderal LA to bid   GERD (gastroesophageal reflux disease)    Glaucoma (increased eye pressure)    Heart murmur    HTN (hypertension)    IBS (irritable bowel syndrome)    LBP (low back pain)    Menopause    Osteoporosis    Ovarian cancer (HCC) 09/2008   Dr Janae Sauce   Pancreatic cyst    Vitamin B12 deficiency    Vitamin D deficiency     PAST SURGICAL HISTORY: Past Surgical History:  Procedure Laterality Date   ABDOMINAL HYSTERECTOMY     APPENDECTOMY     BLADDER SURGERY  04/09/2020   tack and sling   CHOLECYSTECTOMY N/A 05/15/2017   Procedure: LAPAROSCOPIC CHOLECYSTECTOMY WITH INTRAOPERATIVE CHOLANGIOGRAM AND LYSIS OF ADHESIONS;  Surgeon: Claud Kelp, MD;  Location: [facility];  Service: General;  Laterality: N/A;   COLONOSCOPY  2018   Dr.Perry   ELBOW FRACTURE SURGERY     age 38- left elbow   HEMORRHOID SURGERY  2001   Ovarian Cancer Debulking  09/2008   POLYPECTOMY      ALLERGIES: Allergies  Allergen Reactions   Boniva [Ibandronate Sodium]     cramp   Calcium Channel Blockers     Upset stomach   Chlorthalidone     Diarrhea per pt   Compazine     Daughter reacts to compazine/pt does not want to take   Hyoscyamine      Dry mouth   Lyrica [Pregabalin]     Dizzy    Risedronate Sodium Other (See Comments)    Upset stomach   Tape Rash    redness    FAMILY HISTORY:  Family History  Problem Relation Age of Onset   Alzheimer's disease Mother 53   Other Mother 61       TAH for excessive bleeding   Lung cancer Father        smoker; metastasis to stomach and other areas   Heart attack Maternal Uncle    Heart disease Maternal Uncle    Other Paternal Aunt        stomach issues   Heart Problems Paternal Uncle    Other Maternal Grandmother        stomach issues; +hysterectomy   Heart attack Maternal Grandfather    Heart disease Maternal Grandfather    Infertility Daughter    Stomach cancer Cousin        dx. mid-60s   Other Cousin        stomach issues   Leukemia Cousin 18   Stomach cancer Other    Cancer Cousin        unknown type   Other Cousin        stomach issues   Heart Problems Maternal Uncle    Diabetes Paternal Aunt    Heart Problems Paternal Uncle    Emphysema Paternal Uncle        work exposure   Breast cancer Cousin        dx. late 60s-early 70s   Colon cancer Neg Hx    Rectal cancer Neg Hx    Esophageal cancer Neg Hx     SOCIAL HISTORY: Social History   Socioeconomic History   Marital status: Married    Spouse name: Jillyn Hidden   Number of children: 2   Years of education: BS   Highest education level: Bachelor's degree (e.g., BA, AB, BS)  Occupational History   Occupation: Dentist: HOMEMAKER  Tobacco Use   Smoking status: Never   Smokeless tobacco: Never  Vaping Use   Vaping status: Never Used  Substance and Sexual Activity   Alcohol use: No    Alcohol/week: 0.0 standard drinks of alcohol   Drug use: No   Sexual activity: Not on file  Other Topics Concern   Not on file  Social History Narrative   Lives with spouse   Caffeine use: cokes      Right handed    Social Determinants of Corporate investment banker  Strain: Low Risk  (06/28/2023)    Overall Financial Resource Strain (CARDIA)    Difficulty of Paying Living Expenses: Not hard at all  Food Insecurity: No Food Insecurity (06/28/2023)   Hunger Vital Sign    Worried About Running Out of Food in the Last Year: Never true    Ran Out of Food in the Last Year: Never true  Transportation Needs: No Transportation Needs (06/28/2023)   PRAPARE - Administrator, Civil Service (Medical): No    Lack of Transportation (Non-Medical): No  Physical Activity: Inactive (06/28/2023)   Exercise Vital Sign    Days of Exercise per Week: 0 days    Minutes of Exercise per Session: 0 min  Stress: Stress Concern Present (06/28/2023)   [employer] of Occupational Health - Occupational Stress Questionnaire    Feeling of Stress : To some extent  Social Connections: Socially Integrated (06/28/2023)   Social Connection and Isolation Panel [NHANES]    Frequency of Communication with Friends and Family: More than three times a week    Frequency of Social Gatherings with Friends and Family: More than three times a week    Attends Religious Services: More than 4 times per year    Active Member of [employer] or Organizations: Yes    Attends Engineer, structural: More than 4 times per year    Marital Status: Married    MEDICATIONS:  Current Outpatient Medications  Medication Sig Dispense Refill   amLODipine (NORVASC) 5 MG tablet TAKE 1 TABLET (5 MG TOTAL) BY MOUTH DAILY. 90 tablet 2   aspirin 81 MG EC tablet Take 81 mg by mouth daily.     bimatoprost (LUMIGAN) 0.01 % SOLN Place 1 drop into both eyes at bedtime.     Blood Glucose Monitoring Suppl (ONETOUCH VERIO FLEX SYSTEM) w/Device KIT 1 each by Does not apply route in the morning, at noon, and at bedtime.      butalbital-acetaminophen-caffeine (FIORICET) 50-325-40 MG tablet Take 1 tablet by mouth every 6 (six) hours as needed for headache. 60 tablet 0   [facility]S DIGESTIVE PROBIOTIC 250 MG capsule TAKE 1 CAPSULE BY MOUTH 2 TIMES  DAILY. 50 capsule 2   DENTA 5000 PLUS 1.1 % CREA dental cream Take by mouth as directed.     diphenoxylate-atropine (LOMOTIL) 2.5-0.025 MG tablet TAKE 1 TABLET BY MOUTH FOUR TIMES A DAY AS NEEDED FOR DIARRHEA OR LOOSE STOOLS 60 tablet 3   fluticasone (FLONASE) 50 MCG/ACT nasal spray Place 1 spray into both nostrils daily. 15.8 mL 0   furosemide (LASIX) 20 MG tablet TAKE 0.5-1 TABLETS (10-20 MG TOTAL) BY MOUTH DAILY AS NEEDED FOR EDEMA (FOR LEG SWELLING). 90 tablet 1   glucose blood (ONETOUCH VERIO) test strip Check blood sugar alternating fasting and 2 hours after meals 200 strip 1   KLOR-CON M20 20 MEQ tablet TAKE 1 TABLET BY MOUTH EVERY DAY 90 tablet 3   Lancets (ONETOUCH DELICA PLUS LANCET30G) MISC Check blood sugar alternating fasting and 2 hours after meals 200 each 2   LORazepam (ATIVAN) 0.5 MG tablet Take 1 tablet (0.5 mg total) by mouth 2 (two) times daily as needed for anxiety. 60 tablet 2   methocarbamol (ROBAXIN) 500 MG tablet Take 1 tablet (500 mg total) by mouth every 8 (eight) hours as needed for muscle spasms. 90 tablet 1   NASCOBAL 500 MCG/0.1ML SOLN USE 1 SPRAY IN 1 NOSTRIL ONCE A WEEK 12 each 3   oxybutynin (DITROPAN) 5 MG tablet  TAKE 1 TABLET BY MOUTH THREE TIMES A DAY 270 tablet 1   PROLIA 60 MG/ML SOSY injection INJECT 1 SYRINGE UNDER THE SKIN ONCE EVERY 6 MONTHS 1 mL 0   propranolol ER (INDERAL LA) 60 MG 24 hr capsule TAKE 1 CAPSULE BY MOUTH 2 TIMES DAILY. 180 capsule 3   RABEprazole (ACIPHEX) 20 MG tablet TAKE 1 TABLET BY MOUTH EVERY DAY 90 tablet 3   timolol (TIMOPTIC) 0.5 % ophthalmic solution Place 1 drop into both eyes 2 (two) times daily.     triamcinolone cream (KENALOG) 0.1 % APPLY TO AFFECTED AREA TWICE A DAY AS NEEDED     VITAMIN D3 1.25 MG (50000 UT) capsule TAKE ONE CAPSULE BY MOUTH EVERY 30 DAYS 9 capsule 1   empagliflozin (JARDIANCE) 10 MG TABS tablet Take 1 tablet (10 mg total) by mouth daily with breakfast. 90 tablet 3   rosuvastatin (CRESTOR) 5 MG tablet TAKE  1 TABLET (5 MG TOTAL) BY MOUTH DAILY. (Patient not taking: Reported on 07/03/2023) 90 tablet 1   No current facility-administered medications for this visit.    PHYSICAL EXAM: Vitals:   07/03/23 1407 07/03/23 1415  BP: (!) 150/70 (!) 154/72  Pulse: 66   Resp: 20   SpO2: 96%   Weight: 175 lb 3.2 oz (79.5 kg)   Height: 5\' 4"  (1.626 m)    Body mass index is 30.07 kg/m.  Wt Readings from Last 3 Encounters:  07/03/23 175 lb 3.2 oz (79.5 kg)  06/28/23 174 lb (78.9 kg)  03/29/23 172 lb (78 kg)    General: Well developed, well nourished female in no apparent distress.  HEENT: AT/[place], no external lesions.  Eyes: Conjunctiva clear and no icterus. Neck: Neck supple  Lungs: Respirations not labored Neurologic: Alert, oriented, normal speech Extremities / Skin: Dry. No sores or rashes noted.  Psychiatric: Does not appear depressed. Some what Anxious.  Diabetic Foot Exam - Simple   No data filed     LABS Reviewed Lab Results  Component Value Date   HGBA1C 5.5 07/03/2023   HGBA1C 5.9 02/23/2023   HGBA1C 6.1 11/15/2022   Lab Results  Component Value Date   FRUCTOSAMINE 257 09/23/2019   Lab Results  Component Value Date   CHOL 115 11/28/2022   HDL 59.60 11/28/2022   LDLCALC 26 11/28/2022   LDLDIRECT 122.0 05/24/2022   TRIG 149.0 11/28/2022   CHOLHDL 2 11/28/2022   Lab Results  Component Value Date   MICRALBCREAT 4.8 11/24/2022   MICRALBCREAT 4.7 11/16/2021   Lab Results  Component Value Date   CREATININE 1.25 (H) 03/29/2023   Lab Results  Component Value Date   GFR 41.64 (L) 03/29/2023    ASSESSMENT / PLAN  1. Type 2 diabetes mellitus with obesity (HCC)     Diabetes Mellitus type 2, complicated by CKD, diabetic retinopathy. - Diabetic status / severity: Controlled.  Lab Results  Component Value Date   HGBA1C 5.5 07/03/2023    - Hemoglobin A1c goal : <7%  Hemoglobin A1c 5.5% today.  - Medications: No change.  I) Jardiance 10 mg daily.  Discussed  that regardless is not having hypoglycemia.  Lowest blood sugar 77.  Asked check blood sugar when symptomatic otherwise okay to check blood sugar in the morning fasting daily.  - Home glucose testing: In the morning fasting and as needed. - Discussed/ Gave Hypoglycemia treatment plan.  # Consult : not required at this time.   # Annual urine for microalbuminuria/ creatinine ratio, no microalbuminuria  currently.  CKD 3 following with nephrology. Last  Lab Results  Component Value Date   MICRALBCREAT 4.8 11/24/2022    # Foot check nightly / neuropathy.  # Annual dilated diabetic eye exams.  She has diabetic retinopathy, following regularly with ophthalmology.  - Diet: Eat reasonable portion sizes to promote a healthy weight  2. Blood pressure  -  BP Readings from Last 1 Encounters:  07/03/23 (!) 154/72    - Control is not in target. High blood pressure asked to talk with primary care provider and monitor at home. - No change in current plans.  3. Lipid status / Hyperlipidemia - Last  Lab Results  Component Value Date   LDLCALC 26 11/28/2022   -Currently not taking rosuvastatin.  Managed by primary care provider. -Recommend to restart rosuvastatin.  Diagnoses and all orders for this visit:  Type 2 diabetes mellitus with obesity (HCC) -     POCT glycosylated hemoglobin (Hb A1C)  Other orders -     empagliflozin (JARDIANCE) 10 MG TABS tablet; Take 1 tablet (10 mg total) by mouth daily with breakfast.    DISPOSITION Follow up in clinic in 4 months suggested.   All questions answered and patient verbalized understanding of the plan.  Brandi Shelita Steptoe, MD [facility] Endocrinology [facility] [address] [place], [address] [place], [place] [address] Phone # [phone]  At least part of this note was generated using voice recognition software. Inadvertent word errors may have occurred, which were not recognized during the proofreading process.

## 2023-07-26 DIAGNOSIS — D1801 Hemangioma of skin and subcutaneous tissue: Secondary | ICD-10-CM | POA: Diagnosis not present

## 2023-07-26 DIAGNOSIS — L820 Inflamed seborrheic keratosis: Secondary | ICD-10-CM | POA: Diagnosis not present

## 2023-07-26 DIAGNOSIS — L280 Lichen simplex chronicus: Secondary | ICD-10-CM | POA: Diagnosis not present

## 2023-07-26 DIAGNOSIS — L570 Actinic keratosis: Secondary | ICD-10-CM | POA: Diagnosis not present

## 2023-07-26 DIAGNOSIS — Z85828 Personal history of other malignant neoplasm of skin: Secondary | ICD-10-CM | POA: Diagnosis not present

## 2023-07-26 DIAGNOSIS — L821 Other seborrheic keratosis: Secondary | ICD-10-CM | POA: Diagnosis not present

## 2023-09-05 ENCOUNTER — Telehealth: Payer: Self-pay | Admitting: *Deleted

## 2023-09-05 NOTE — Telephone Encounter (Signed)
Spoke with the patient and scheduled a follow up appt with Dr Tamela Oddi on 1/8

## 2023-09-13 ENCOUNTER — Inpatient Hospital Stay: Payer: Medicare Other | Attending: Obstetrics & Gynecology

## 2023-09-13 ENCOUNTER — Inpatient Hospital Stay (HOSPITAL_BASED_OUTPATIENT_CLINIC_OR_DEPARTMENT_OTHER): Payer: Medicare Other | Admitting: Obstetrics & Gynecology

## 2023-09-13 ENCOUNTER — Encounter: Payer: Self-pay | Admitting: Obstetrics & Gynecology

## 2023-09-13 VITALS — BP 150/72 | HR 59 | Temp 97.6°F | Resp 17 | Wt 177.4 lb

## 2023-09-13 DIAGNOSIS — Z8544 Personal history of malignant neoplasm of other female genital organs: Secondary | ICD-10-CM | POA: Diagnosis not present

## 2023-09-13 DIAGNOSIS — C5700 Malignant neoplasm of unspecified fallopian tube: Secondary | ICD-10-CM

## 2023-09-13 DIAGNOSIS — Z08 Encounter for follow-up examination after completed treatment for malignant neoplasm: Secondary | ICD-10-CM

## 2023-09-13 DIAGNOSIS — Z9221 Personal history of antineoplastic chemotherapy: Secondary | ICD-10-CM | POA: Insufficient documentation

## 2023-09-13 NOTE — Assessment & Plan Note (Addendum)
 78 yo w/remote h/o a stage IIIC Fallopian tube cancer Symptom review confounded by comorbid diagnoses; normal exam   >annual follow-up appropriate with a generalist gynecologist

## 2023-09-13 NOTE — Patient Instructions (Signed)
 Return in 1 year ?

## 2023-09-13 NOTE — Progress Notes (Signed)
 Follow Up Note: Gyn-Onc  [name] 78 y.o. female  CC: She presents for a follow-up visit  HPI: She has a remote h/o a stage IIIC Fallopian tube cancer Oncology History  Fallopian tube cancer, carcinoma (HCC)  10/07/2008 Cancer Staging   Cancer Staging Fallopian tube cancer, carcinoma (HCC) Staging form: Fallopian Tube, AJCC 7th Edition - Clinical: IIIC - Unsigned - Pathologic: IIIC - Unsigned She was optimally debulked with minimal disease on peritoneal surfaces   04/14/2009 PET scan   NED   02/03/2014 Initial Diagnosis   Fallopian tube cancer, carcinoma (HCC)    - 02/03/2009 Chemotherapy   . She received 6 cycles of carboplatin and Taxol chemotherapy completed in June 2010     Tumor Marker   Patient's tumor was tested for the following markers: CA-125 Results of the tumor marker test revealed : 7 mo ago  (04/22/19) 2 yr ago  (11/20/17) 3 yr ago  (11/16/16) 3 yr ago  (03/21/16)  CA 125 <35 U/mL 10  9 CM      03/30/2022 Tumor Marker   Patient's tumor was tested for the following markers: CA 125. Results of the tumor marker test revealed 11.1.    Interval History:  History remarkable for IBS, OAB and diagnosed DJD per the pt.   C/O chronic back pain with persistent discomfort around the waist and rib area. She also reports prickly nerve sensations on her scalp, which have remained unchanged since the last CT scan. She has been experiencing aches and pains in the legs and arms, with the left arm reportedly swelling more than the right.  She also reports frequent urination, likely due to increased water intake, and ongoing stomach issues, which she has had since birth. These stomach issues have been causing discomfort on both the right and left sides. She has been managing these symptoms as best as she can, but she has been limiting her activity due to the discomfort.  She has been under the care  of an internist and an endocrinologist, with an upcoming appointment with the former at the end of the month. She also has an ultrasound scheduled for her kidney in March. Her blood sugar levels have been a concern, but she has been managing it through diet and medication for the past 15 years.  Despite these health issues, her weight has remained stable and her energy levels have been satisfactory. However, she expresses a desire to be more active and to feel better overall. She is due for a colonoscopy soon.   Review of Systems  Review of Systems  Constitutional:  Negative for malaise/fatigue.  Gastrointestinal:  Negative for abdominal pain and diarrhea.  Genitourinary:  Positive for frequency and urgency.    Current Meds:  Outpatient Encounter Medications as of 09/13/2023  Medication Sig   amLODipine  (NORVASC ) 5 MG tablet TAKE 1 TABLET (5 MG TOTAL) BY MOUTH DAILY.   aspirin  81 MG EC tablet Take 81 mg by mouth daily.   bimatoprost  (LUMIGAN ) 0.01 % SOLN Place 1 drop into both eyes at bedtime.   Blood Glucose Monitoring Suppl (ONETOUCH VERIO FLEX SYSTEM) w/Device KIT 1 each by Does not apply route in the morning, at noon, and at bedtime.    butalbital -acetaminophen -caffeine  (FIORICET) 50-325-40 MG tablet Take 1 tablet by mouth every 6 (six) hours as needed for headache.   [facility] DIGESTIVE PROBIOTIC 250 MG capsule TAKE 1 CAPSULE BY MOUTH 2 TIMES DAILY.   DENTA 5000 PLUS 1.1 % CREA dental cream Take by  mouth as directed.   diphenoxylate -atropine  (LOMOTIL ) 2.5-0.025 MG tablet TAKE 1 TABLET BY MOUTH FOUR TIMES A DAY AS NEEDED FOR DIARRHEA OR LOOSE STOOLS   empagliflozin  (JARDIANCE ) 10 MG TABS tablet Take 1 tablet (10 mg total) by mouth daily with breakfast.   fluticasone  (FLONASE ) 50 MCG/ACT nasal spray Place 1 spray into both nostrils daily.   furosemide  (LASIX ) 20 MG tablet TAKE 0.5-1 TABLETS (10-20 MG TOTAL) BY MOUTH DAILY AS NEEDED FOR EDEMA (FOR LEG SWELLING).   glucose blood (ONETOUCH VERIO)  test strip Check blood sugar alternating fasting and 2 hours after meals   Lancets (ONETOUCH DELICA PLUS LANCET30G) MISC Check blood sugar alternating fasting and 2 hours after meals   LORazepam  (ATIVAN ) 0.5 MG tablet Take 1 tablet (0.5 mg total) by mouth 2 (two) times daily as needed for anxiety.   methocarbamol  (ROBAXIN ) 500 MG tablet Take 1 tablet (500 mg total) by mouth every 8 (eight) hours as needed for muscle spasms.   NASCOBAL  500 MCG/0.1ML SOLN USE 1 SPRAY IN 1 NOSTRIL ONCE A WEEK   oxybutynin  (DITROPAN ) 5 MG tablet TAKE 1 TABLET BY MOUTH THREE TIMES A DAY   PROLIA  60 MG/ML SOSY injection INJECT 1 SYRINGE UNDER THE SKIN ONCE EVERY 6 MONTHS   propranolol  ER (INDERAL  LA) 60 MG 24 hr capsule TAKE 1 CAPSULE BY MOUTH 2 TIMES DAILY.   RABEprazole  (ACIPHEX ) 20 MG tablet TAKE 1 TABLET BY MOUTH EVERY DAY   rosuvastatin  (CRESTOR ) 5 MG tablet TAKE 1 TABLET (5 MG TOTAL) BY MOUTH DAILY.   timolol  (TIMOPTIC ) 0.5 % ophthalmic solution Place 1 drop into both eyes 2 (two) times daily.   triamcinolone  cream (KENALOG) 0.1 % APPLY TO AFFECTED AREA TWICE A DAY AS NEEDED   VITAMIN D3 1.25 MG (50000 UT) capsule TAKE ONE CAPSULE BY MOUTH EVERY 30 DAYS   KLOR-CON  M20 20 MEQ tablet TAKE 1 TABLET BY MOUTH EVERY DAY (Patient not taking: Reported on 09/07/2023)   No facility-administered encounter medications on file as of 09/13/2023.    Allergy:  Allergies  Allergen Reactions   Boniva [Ibandronate Sodium]     cramp   Calcium  Channel Blockers     Upset stomach   Chlorthalidone      Diarrhea per pt   Compazine     Daughter reacts to compazine/pt does not want to take   Hyoscyamine      Dry mouth   Lyrica  [Pregabalin ]     Dizzy    Risedronate Sodium Other (See Comments)    Upset stomach   Tape Rash    redness    Social Hx:   Social History   Socioeconomic History   Marital status: Married    Spouse name: Arley   Number of children: 2   Years of education: BS   Highest education level: Bachelor's  degree (e.g., BA, AB, BS)  Occupational History   Occupation: Dentist: HOMEMAKER  Tobacco Use   Smoking status: Never   Smokeless tobacco: Never  Vaping Use   Vaping status: Never Used  Substance and Sexual Activity   Alcohol use: No    Alcohol/week: 0.0 standard drinks of alcohol   Drug use: No   Sexual activity: Not on file  Other Topics Concern   Not on file  Social History Narrative   Lives with spouse   Caffeine  use: cokes      Right handed    Social Drivers of Health   Financial Resource Strain: Low Risk  (06/28/2023)  Overall Financial Resource Strain (CARDIA)    Difficulty of Paying Living Expenses: Not hard at all  Food Insecurity: No Food Insecurity (06/28/2023)   Hunger Vital Sign    Worried About Running Out of Food in the Last Year: Never true    Ran Out of Food in the Last Year: Never true  Transportation Needs: No Transportation Needs (06/28/2023)   PRAPARE - Administrator, Civil Service (Medical): No    Lack of Transportation (Non-Medical): No  Physical Activity: Inactive (06/28/2023)   Exercise Vital Sign    Days of Exercise per Week: 0 days    Minutes of Exercise per Session: 0 min  Stress: Stress Concern Present (06/28/2023)   [employer] of Occupational Health - Occupational Stress Questionnaire    Feeling of Stress : To some extent  Social Connections: Socially Integrated (06/28/2023)   Social Connection and Isolation Panel [NHANES]    Frequency of Communication with Friends and Family: More than three times a week    Frequency of Social Gatherings with Friends and Family: More than three times a week    Attends Religious Services: More than 4 times per year    Active Member of Clubs or Organizations: Yes    Attends Banker Meetings: More than 4 times per year    Marital Status: Married  Catering Manager Violence: Not At Risk (12/27/2022)   Humiliation, Afraid, Rape, and Kick questionnaire    Fear of  Current or Ex-Partner: No    Emotionally Abused: No    Physically Abused: No    Sexually Abused: No    Past Surgical Hx:  Past Surgical History:  Procedure Laterality Date   ABDOMINAL HYSTERECTOMY     APPENDECTOMY     BLADDER SURGERY  04/09/2020   tack and sling   CHOLECYSTECTOMY N/A 05/15/2017   Procedure: LAPAROSCOPIC CHOLECYSTECTOMY WITH INTRAOPERATIVE CHOLANGIOGRAM AND LYSIS OF ADHESIONS;  Surgeon: Gail Favorite, MD;  Location: [facility];  Service: General;  Laterality: N/A;   COLONOSCOPY  2018   Dr.Perry   ELBOW FRACTURE SURGERY     age 22- left elbow   HEMORRHOID SURGERY  2001   Ovarian Cancer Debulking  09/2008   POLYPECTOMY      Past Medical Hx:  Past Medical History:  Diagnosis Date   Adenomatous colon polyp 04/08/2011   Anemia    Anxiety    Cataract    Diabetes mellitus without complication (HCC)    pre-diabetic per pt   Essential hypertension 08/10/2007   Chronic  On Catapress (per Dr Cary) - increase to bid - d/c Hydralazine  prn per Dr Fernande d/c 3/19: Increase Inderal  LA to bid   GERD (gastroesophageal reflux disease)    Glaucoma (increased eye pressure)    Heart murmur    HTN (hypertension)    IBS (irritable bowel syndrome)    LBP (low back pain)    Menopause    Osteoporosis    Ovarian cancer (HCC) 09/2008   Dr Cecil   Pancreatic cyst    Vitamin B12 deficiency    Vitamin D  deficiency     Family Hx:  Family History  Problem Relation Age of Onset   Alzheimer's disease Mother 50   Other Mother 85       TAH for excessive bleeding   Lung cancer Father        smoker; metastasis to stomach and other areas   Heart attack Maternal Uncle    Heart disease Maternal Uncle  Other Paternal Aunt        stomach issues   Heart Problems Paternal Uncle    Other Maternal Grandmother        stomach issues; +hysterectomy   Heart attack Maternal Grandfather    Heart disease Maternal Grandfather    Infertility Daughter    Stomach cancer Cousin        dx.  mid-60s   Other Cousin        stomach issues   Leukemia Cousin 18   Stomach cancer Other    Cancer Cousin        unknown type   Other Cousin        stomach issues   Heart Problems Maternal Uncle    Diabetes Paternal Aunt    Heart Problems Paternal Uncle    Emphysema Paternal Uncle        work exposure   Breast cancer Cousin        dx. late 60s-early 70s   Colon cancer Neg Hx    Rectal cancer Neg Hx    Esophageal cancer Neg Hx     Vitals:  BP (!) 150/72 (BP Location: Left Arm, Patient Position: Sitting) Comment: checked manual, RN and Md notified  Pulse (!) 59   Temp 97.6 F (36.4 C) (Oral)   Resp 17   Wt 177 lb 6.4 oz (80.5 kg)   SpO2 98%   BMI 30.45 kg/m       Physical Exam Constitutional:      General: She is not in acute distress. Abdominal:     General: There is no distension.     Palpations: Abdomen is soft. There is no mass.     Tenderness: There is no abdominal tenderness.  Genitourinary:    Vagina: Normal.     Comments: Angiokeratomas, b/l; short vagina, no palpable abnormality.  Bladder well supported.  Neurological:     Mental Status: She is alert and oriented to person, place, and time.  Psychiatric:        Mood and Affect: Mood is depressed.     Assessment/Plan:   Fallopian tube cancer, carcinoma (HCC) 78 yo w/remote h/o a stage IIIC Fallopian tube cancer Symptom review confounded by comorbid diagnoses; normal exam   >annual follow-up appropriate with a generalist gynecologist        I personally spent 30 minutes face-to-face and non-face-to-face in the care of this patient, which includes all pre, intra, and post visit time on the date of service.   Olam Mill, MD 09/13/2023, 9:05 PM

## 2023-09-14 LAB — CA 125: Cancer Antigen (CA) 125: 9.2 U/mL (ref 0.0–38.1)

## 2023-09-25 ENCOUNTER — Telehealth: Payer: Self-pay

## 2023-09-25 NOTE — Telephone Encounter (Signed)
-----   Message from Nurse Gwenlyn Found sent at 09/25/2023 11:34 AM EST -----  ----- Message ----- From: Doylene Bode, NP Sent: 09/25/2023   8:56 AM EST To: Dineen Kid, RN; Shellee Milo, RN  Please let her know her CA 125 level is 9.2- stable and within normal range

## 2023-09-25 NOTE — Telephone Encounter (Signed)
Pt is aware of recent CA125 being stable and within normal range

## 2023-09-28 ENCOUNTER — Telehealth: Payer: Self-pay

## 2023-09-28 ENCOUNTER — Ambulatory Visit: Payer: Medicare Other | Admitting: Internal Medicine

## 2023-09-28 ENCOUNTER — Other Ambulatory Visit (HOSPITAL_COMMUNITY): Payer: Self-pay

## 2023-09-28 ENCOUNTER — Encounter: Payer: Self-pay | Admitting: Internal Medicine

## 2023-09-28 VITALS — BP 110/80 | HR 83 | Temp 98.0°F | Ht 64.0 in | Wt 178.0 lb

## 2023-09-28 DIAGNOSIS — E669 Obesity, unspecified: Secondary | ICD-10-CM

## 2023-09-28 DIAGNOSIS — E1169 Type 2 diabetes mellitus with other specified complication: Secondary | ICD-10-CM | POA: Diagnosis not present

## 2023-09-28 DIAGNOSIS — E559 Vitamin D deficiency, unspecified: Secondary | ICD-10-CM

## 2023-09-28 DIAGNOSIS — R202 Paresthesia of skin: Secondary | ICD-10-CM | POA: Diagnosis not present

## 2023-09-28 DIAGNOSIS — K30 Functional dyspepsia: Secondary | ICD-10-CM

## 2023-09-28 DIAGNOSIS — I10 Essential (primary) hypertension: Secondary | ICD-10-CM

## 2023-09-28 LAB — COMPREHENSIVE METABOLIC PANEL
ALT: 20 U/L (ref 0–35)
AST: 23 U/L (ref 0–37)
Albumin: 4.9 g/dL (ref 3.5–5.2)
Alkaline Phosphatase: 67 U/L (ref 39–117)
BUN: 27 mg/dL — ABNORMAL HIGH (ref 6–23)
CO2: 26 meq/L (ref 19–32)
Calcium: 9.6 mg/dL (ref 8.4–10.5)
Chloride: 103 meq/L (ref 96–112)
Creatinine, Ser: 1.23 mg/dL — ABNORMAL HIGH (ref 0.40–1.20)
GFR: 42.3 mL/min — ABNORMAL LOW (ref >60.00)
Glucose, Bld: 105 mg/dL — ABNORMAL HIGH (ref 70–99)
Potassium: 3.8 meq/L (ref 3.5–5.1)
Sodium: 140 meq/L (ref 135–145)
Total Bilirubin: 0.7 mg/dL (ref 0.2–1.2)
Total Protein: 8.2 g/dL (ref 6.0–8.3)

## 2023-09-28 LAB — CBC WITH DIFFERENTIAL/PLATELET
Basophils Absolute: 0.1 10*3/uL (ref 0.0–0.1)
Basophils Relative: 1.2 % (ref 0.0–3.0)
Eosinophils Absolute: 0.1 10*3/uL (ref 0.0–0.7)
Eosinophils Relative: 1.6 % (ref 0.0–5.0)
HCT: 40.3 % (ref 36.0–46.0)
Hemoglobin: 13.4 g/dL (ref 12.0–15.0)
Lymphocytes Relative: 15.7 % (ref 12.0–46.0)
Lymphs Abs: 1.1 10*3/uL (ref 0.7–4.0)
MCHC: 33.3 g/dL (ref 30.0–36.0)
MCV: 87.7 fL (ref 78.0–100.0)
Monocytes Absolute: 0.5 10*3/uL (ref 0.1–1.0)
Monocytes Relative: 7 % (ref 3.0–12.0)
Neutro Abs: 5.1 10*3/uL (ref 1.4–7.7)
Neutrophils Relative %: 74.5 % (ref 43.0–77.0)
Platelets: 241 10*3/uL (ref 150.0–400.0)
RBC: 4.6 Mil/uL (ref 3.87–5.11)
RDW: 13.8 % (ref 11.5–15.5)
WBC: 6.8 10*3/uL (ref 4.0–10.5)

## 2023-09-28 LAB — HEMOGLOBIN A1C: Hgb A1c MFr Bld: 6.3 % (ref 4.6–6.5)

## 2023-09-28 LAB — VITAMIN D 25 HYDROXY (VIT D DEFICIENCY, FRACTURES): VITD: 44.67 ng/mL (ref 30.00–100.00)

## 2023-09-28 LAB — T4, FREE: Free T4: 1.11 ng/dL (ref 0.60–1.60)

## 2023-09-28 LAB — VITAMIN B12: Vitamin B-12: 559 pg/mL (ref 211–911)

## 2023-09-28 LAB — TSH: TSH: 2.1 u[IU]/mL (ref 0.35–5.50)

## 2023-09-28 MED ORDER — DIPHENOXYLATE-ATROPINE 2.5-0.025 MG PO TABS: 1.0000 | ORAL_TABLET | Freq: Four times a day (QID) | ORAL | 5 refills | Status: DC | PRN

## 2023-09-28 MED ORDER — FUROSEMIDE 20 MG PO TABS: 10.0000 mg | ORAL_TABLET | Freq: Every day | ORAL | 1 refills | Status: DC | PRN

## 2023-09-28 MED ORDER — RABEPRAZOLE SODIUM 20 MG PO TBEC: 20.0000 mg | DELAYED_RELEASE_TABLET | Freq: Every day | ORAL | 3 refills | Status: DC

## 2023-09-28 NOTE — Assessment & Plan Note (Signed)
Managing ok

## 2023-09-28 NOTE — Assessment & Plan Note (Signed)
Nl BP now F/u w/Dr [facility]isue [facility] Nephrology - Norvasc at [facility], Propranolol

## 2023-09-28 NOTE — Assessment & Plan Note (Signed)
Vit D 

## 2023-09-28 NOTE — Telephone Encounter (Signed)
Pharmacy Patient Advocate Encounter   Received notification from Pt Calls Messages that prior authorization for Prolia is required/requested.   Insurance verification completed.   The patient is insured through [facility] .   Per test claim: PA required; PA submitted to above mentioned insurance via CoverMyMeds Key/confirmation #/EOC [id number] Status is pending

## 2023-09-28 NOTE — Telephone Encounter (Signed)
Updated PA needed for PT Prolia injections.  Pt get's these injection's through our [facility].  DX to be used for PA is Age-related osteoporosis without current pathological fracture.

## 2023-09-28 NOTE — Assessment & Plan Note (Signed)
  On diet  

## 2023-09-28 NOTE — Progress Notes (Signed)
Subjective:  Patient ID: [name], female    DOB: 1946/06/24  Age: 78 y.o. MRN: [id number]  CC: Medical Management of Chronic Issues (3 mnth f/u)   HPI [name] presents for IBS-D, HAs, anxiety f/u  Outpatient Medications Prior to Visit  Medication Sig Dispense Refill   amLODipine (NORVASC) 5 MG tablet TAKE 1 TABLET (5 MG TOTAL) BY MOUTH DAILY. 90 tablet 2   aspirin 81 MG EC tablet Take 81 mg by mouth daily.     bimatoprost (LUMIGAN) 0.01 % SOLN Place 1 drop into both eyes at bedtime.     Blood Glucose Monitoring Suppl (ONETOUCH VERIO FLEX SYSTEM) w/Device KIT 1 each by Does not apply route in the morning, at noon, and at bedtime.      butalbital-acetaminophen-caffeine (FIORICET) 50-325-40 MG tablet Take 1 tablet by mouth every 6 (six) hours as needed for headache. 60 tablet 0   [facility] DIGESTIVE PROBIOTIC 250 MG capsule TAKE 1 CAPSULE BY MOUTH 2 TIMES DAILY. 50 capsule 2   DENTA 5000 PLUS 1.1 % CREA dental cream Take by mouth as directed.     empagliflozin (JARDIANCE) 10 MG TABS tablet Take 1 tablet (10 mg total) by mouth daily with breakfast. 90 tablet 3   fluticasone (FLONASE) 50 MCG/ACT nasal spray Place 1 spray into both nostrils daily. 15.8 mL 0   glucose blood (ONETOUCH VERIO) test strip Check blood sugar alternating fasting and 2 hours after meals 200 strip 1   Lancets (ONETOUCH DELICA PLUS LANCET30G) MISC Check blood sugar alternating fasting and 2 hours after meals 200 each 2   LORazepam (ATIVAN) 0.5 MG tablet Take 1 tablet (0.5 mg total) by mouth 2 (two) times daily as needed for anxiety. 60 tablet 2   methocarbamol (ROBAXIN) 500 MG tablet Take 1 tablet (500 mg total) by mouth every 8 (eight) hours as needed for muscle spasms. 90 tablet 1   NASCOBAL 500 MCG/0.1ML SOLN USE 1 SPRAY IN 1 NOSTRIL ONCE A WEEK 12 each 3   oxybutynin (DITROPAN) 5 MG tablet TAKE 1 TABLET BY MOUTH THREE TIMES A DAY 270 tablet 1   PROLIA 60 MG/ML SOSY injection INJECT 1 SYRINGE UNDER THE  SKIN ONCE EVERY 6 MONTHS 1 mL 0   propranolol ER (INDERAL LA) 60 MG 24 hr capsule TAKE 1 CAPSULE BY MOUTH 2 TIMES DAILY. 180 capsule 3   rosuvastatin (CRESTOR) 5 MG tablet TAKE 1 TABLET (5 MG TOTAL) BY MOUTH DAILY. 90 tablet 1   timolol (TIMOPTIC) 0.5 % ophthalmic solution Place 1 drop into both eyes 2 (two) times daily.     triamcinolone cream (KENALOG) 0.1 % APPLY TO AFFECTED AREA TWICE A DAY AS NEEDED     VITAMIN D3 1.25 MG (50000 UT) capsule TAKE ONE CAPSULE BY MOUTH EVERY 30 DAYS 9 capsule 1   diphenoxylate-atropine (LOMOTIL) 2.5-0.025 MG tablet TAKE 1 TABLET BY MOUTH FOUR TIMES A DAY AS NEEDED FOR DIARRHEA OR LOOSE STOOLS 60 tablet 3   furosemide (LASIX) 20 MG tablet TAKE 0.5-1 TABLETS (10-20 MG TOTAL) BY MOUTH DAILY AS NEEDED FOR EDEMA (FOR LEG SWELLING). 90 tablet 1   RABEprazole (ACIPHEX) 20 MG tablet TAKE 1 TABLET BY MOUTH EVERY DAY 90 tablet 3   KLOR-CON M20 20 MEQ tablet TAKE 1 TABLET BY MOUTH EVERY DAY (Patient not taking: Reported on 09/28/2023) 90 tablet 3   No facility-administered medications prior to visit.    ROS: Review of Systems  Constitutional:  Negative for activity change, appetite  change, chills, fatigue and unexpected weight change.  HENT:  Negative for congestion, mouth sores and sinus pressure.   Eyes:  Negative for visual disturbance.  Respiratory:  Negative for cough and chest tightness.   Gastrointestinal:  Positive for diarrhea. Negative for abdominal pain, blood in stool and nausea.  Genitourinary:  Negative for difficulty urinating, frequency and vaginal pain.  Musculoskeletal:  Negative for back pain and gait problem.  Skin:  Negative for pallor and rash.  Neurological:  Negative for dizziness, tremors, weakness, numbness and headaches.  Psychiatric/Behavioral:  Negative for confusion, dysphoric mood, sleep disturbance and suicidal ideas. The patient is nervous/anxious.     Objective:  BP 110/80 (BP Location: Left Arm, Patient Position: Sitting, Cuff  Size: Normal)   Pulse 83   Temp 98 F (36.7 C) (Oral)   Ht 5\' 4"  (1.626 m)   Wt 178 lb (80.7 kg)   SpO2 96%   BMI 30.55 kg/m   BP Readings from Last 3 Encounters:  09/28/23 110/80  09/13/23 (!) 150/72  07/03/23 (!) 154/72    Wt Readings from Last 3 Encounters:  09/28/23 178 lb (80.7 kg)  09/13/23 177 lb 6.4 oz (80.5 kg)  07/03/23 175 lb 3.2 oz (79.5 kg)    Physical Exam Constitutional:      General: She is not in acute distress.    Appearance: She is well-developed. She is obese.  HENT:     Head: Normocephalic.     Right Ear: External ear normal.     Left Ear: External ear normal.     Nose: Nose normal.  Eyes:     General:        Right eye: No discharge.        Left eye: No discharge.     Conjunctiva/sclera: Conjunctivae normal.     Pupils: Pupils are equal, round, and reactive to light.  Neck:     Thyroid: No thyromegaly.     Vascular: No JVD.     Trachea: No tracheal deviation.  Cardiovascular:     Rate and Rhythm: Normal rate and regular rhythm.     Heart sounds: Normal heart sounds.  Pulmonary:     Effort: No respiratory distress.     Breath sounds: No stridor. No wheezing.  Abdominal:     General: Bowel sounds are normal. There is no distension.     Palpations: Abdomen is soft. There is no mass.     Tenderness: There is no abdominal tenderness. There is no guarding or rebound.  Musculoskeletal:        General: No tenderness.     Cervical back: Normal range of motion and neck supple. No rigidity.     Right lower leg: No edema.     Left lower leg: No edema.  Lymphadenopathy:     Cervical: No cervical adenopathy.  Skin:    Findings: No erythema or rash.  Neurological:     Cranial Nerves: No cranial nerve deficit.     Motor: No abnormal muscle tone.     Coordination: Coordination normal.     Deep Tendon Reflexes: Reflexes normal.  Psychiatric:        Behavior: Behavior normal.        Thought Content: Thought content normal.        Judgment:  Judgment normal.     Lab Results  Component Value Date   WBC 6.4 11/15/2022   HGB 13.1 11/15/2022   HCT 38.6 11/15/2022   PLT 198.0 11/15/2022  GLUCOSE 107 (H) 03/29/2023   CHOL 115 11/28/2022   TRIG 149.0 11/28/2022   HDL 59.60 11/28/2022   LDLDIRECT 122.0 05/24/2022   LDLCALC 26 11/28/2022   ALT 18 03/29/2023   AST 21 03/29/2023   NA 139 03/29/2023   K 3.9 03/29/2023   CL 102 03/29/2023   CREATININE 1.25 (H) 03/29/2023   BUN 28 (H) 03/29/2023   CO2 26 03/29/2023   TSH 1.61 11/15/2022   HGBA1C 5.5 07/03/2023   MICROALBUR 9.0 (H) 11/24/2022    No results found.  Assessment & Plan:   Problem List Items Addressed This Visit     Vitamin D deficiency   Vit D      Relevant Orders   VITAMIN D 25 Hydroxy (Vit-D Deficiency, Fractures)   HTN (hypertension) - Primary   Nl BP now F/u w/Dr [facility]ue [facility] Nephrology - Norvasc at [facility], Propranolol      Relevant Medications   furosemide (LASIX) 20 MG tablet   Other Relevant Orders   CBC with Differential/Platelet   Comprehensive metabolic panel   TSH   T4, free   Vitamin B12   Hemoglobin A1c   VITAMIN D 25 Hydroxy (Vit-D Deficiency, Fractures)   Type 2 diabetes mellitus with obesity (HCC)   On diet      Relevant Orders   CBC with Differential/Platelet   Comprehensive metabolic panel   TSH   T4, free   Vitamin B12   Hemoglobin A1c   VITAMIN D 25 Hydroxy (Vit-D Deficiency, Fractures)   Postprandial distress syndrome   Managing ok      Relevant Orders   CBC with Differential/Platelet   Comprehensive metabolic panel   TSH   T4, free   Vitamin B12   Hemoglobin A1c   VITAMIN D 25 Hydroxy (Vit-D Deficiency, Fractures)   Other Visit Diagnoses       Paresthesias       Relevant Orders   TSH   Vitamin B12         Meds ordered this encounter  Medications   diphenoxylate-atropine (LOMOTIL) 2.5-0.025 MG tablet    Sig: Take 1 tablet by mouth 4 (four) times daily as needed for diarrhea or loose  stools.    Dispense:  60 tablet    Refill:  5    This request is for a new prescription for a controlled substance as required by Federal/State law.   furosemide (LASIX) 20 MG tablet    Sig: Take 0.5-1 tablets (10-20 mg total) by mouth daily as needed for edema (for leg swelling).    Dispense:  90 tablet    Refill:  1   RABEprazole (ACIPHEX) 20 MG tablet    Sig: Take 1 tablet (20 mg total) by mouth daily.    Dispense:  90 tablet    Refill:  3      Follow-up: Return in about 3 months (around 12/27/2023) for a follow-up visit.  Sonda Primes, MD

## 2023-09-29 ENCOUNTER — Other Ambulatory Visit (HOSPITAL_COMMUNITY): Payer: Self-pay

## 2023-09-29 NOTE — Telephone Encounter (Signed)
Pharmacy Patient Advocate Encounter  Received notification from [facility] that Prior Authorization for Prolia has been APPROVED from 09/29/23 to 09/27/24   PA #/Case ID/Reference #: [id number]  Approval letter indexed to media tab

## 2023-09-29 NOTE — Telephone Encounter (Signed)
Copied from CRM [id number]. Topic: Clinical - Medication Question >> Sep 29, 2023  9:58 AM Corin V wrote: Reason for CRM: Patient's husband has questions about the Prolia shot. [facility] due to issues [facility]. It has to be from our pharmacy/supplier and then they will file through [employer]. They got it in July and the process worked. Please verify process is the same as July and call Jillyn Hidden back.He said we should buy and bill Medicare.

## 2023-10-02 ENCOUNTER — Encounter: Payer: Self-pay | Admitting: Internal Medicine

## 2023-10-17 DIAGNOSIS — N1330 Unspecified hydronephrosis: Secondary | ICD-10-CM | POA: Diagnosis not present

## 2023-10-17 DIAGNOSIS — E1169 Type 2 diabetes mellitus with other specified complication: Secondary | ICD-10-CM | POA: Diagnosis not present

## 2023-10-17 DIAGNOSIS — Z87442 Personal history of urinary calculi: Secondary | ICD-10-CM | POA: Diagnosis not present

## 2023-10-17 DIAGNOSIS — N318 Other neuromuscular dysfunction of bladder: Secondary | ICD-10-CM | POA: Diagnosis not present

## 2023-10-17 DIAGNOSIS — Z8544 Personal history of malignant neoplasm of other female genital organs: Secondary | ICD-10-CM | POA: Diagnosis not present

## 2023-10-17 DIAGNOSIS — N1339 Other hydronephrosis: Secondary | ICD-10-CM | POA: Diagnosis not present

## 2023-10-17 DIAGNOSIS — R7989 Other specified abnormal findings of blood chemistry: Secondary | ICD-10-CM | POA: Diagnosis not present

## 2023-10-17 DIAGNOSIS — Z87448 Personal history of other diseases of urinary system: Secondary | ICD-10-CM | POA: Diagnosis not present

## 2023-11-06 ENCOUNTER — Other Ambulatory Visit: Payer: Self-pay | Admitting: Internal Medicine

## 2023-11-08 ENCOUNTER — Ambulatory Visit (INDEPENDENT_AMBULATORY_CARE_PROVIDER_SITE_OTHER): Payer: Medicare Other | Admitting: Endocrinology

## 2023-11-08 ENCOUNTER — Encounter: Payer: Self-pay | Admitting: Endocrinology

## 2023-11-08 VITALS — BP 124/80 | HR 61 | Ht 64.0 in | Wt 181.0 lb

## 2023-11-08 DIAGNOSIS — E118 Type 2 diabetes mellitus with unspecified complications: Secondary | ICD-10-CM | POA: Diagnosis not present

## 2023-11-08 DIAGNOSIS — E785 Hyperlipidemia, unspecified: Secondary | ICD-10-CM | POA: Diagnosis not present

## 2023-11-08 DIAGNOSIS — Z7984 Long term (current) use of oral hypoglycemic drugs: Secondary | ICD-10-CM | POA: Diagnosis not present

## 2023-11-08 NOTE — Progress Notes (Signed)
 Outpatient Endocrinology Note Brandi Omnia Dollinger, MD  11/08/23  Patient's Name: [name]    DOB: 09/17/1945    MRN: [id number]                                                    REASON OF VISIT: Follow up of type 2 diabetes mellitus  PCP: [name], [name], MD  HISTORY OF PRESENT ILLNESS:   [name] is a 78 y.o. old female with past medical history listed below, is here for follow up for type 2 diabetes mellitus.   Pertinent Diabetes History: Patient has type 2 diabetes mellitus, mainly based on fasting blood sugar more than 130 in the past.  [name] initially had prediabetes with hemoglobin A1c in the range of 5.7 to 6.4%.  [name] had seen dietitian in the past.  Chronic Diabetes Complications : Retinopathy: yes. Last ophthalmology exam was done on annually, following with ophthalmology regularly.  Nephropathy: CKD IIIa, following with nephrology. Peripheral neuropathy: yes Coronary artery disease: no Stroke: no  Relevant comorbidities and cardiovascular risk factors: Obesity: yes Body mass index is 31.07 kg/m.  Hypertension: Yes  Hyperlipidemia : Yes, on statin   Current / Home Diabetic regimen includes: Jardiance 10 mg daily.  Started in November 2020.  Prior diabetic medications: [name] was intolerant to metformin.  Glycemic data:    [name] has One Touch Verio Flex glucometer.  Blood sugar in last 14 days February 19 to November 08, 2023 reviewed.  Lowest blood sugar 75, highest blood sugar 161, average blood sugar 117.  [name] has been taking daily in the morning fasting and other times of the day in the afternoon and in the evening.  Some of the fasting blood sugar 109, 121, 102, 110, 75.  Afternoon /evening blood sugar 161, 125, 154, 124.  Hypoglycemia: Patient has no hypoglycemic episodes. Patient has hypoglycemia awareness.  Factors modifying glucose control: 1.  Diabetic diet assessment: 2-3 meals a day.  2.  Staying active or exercising: No formal exercise.  3.   Medication compliance: compliant all of the time.  Interval history  Glucometer data as reviewed above.  Mostly acceptable blood sugar.  [name] has been taking Jardiance, denies any urinary related symptoms.  [name] has been well hydrating.  [name] has mild numbness and tingling of the feet.  Otherwise no complaints today. Patient is accompanied by her husband in the clinic today.  REVIEW OF SYSTEMS As per history of present illness.   PAST MEDICAL HISTORY: Past Medical History:  Diagnosis Date   Adenomatous colon polyp 04/08/2011   Anemia    Anxiety    Cataract    Diabetes mellitus without complication (HCC)    "pre-diabetic" per pt   Essential hypertension 08/10/2007   Chronic  On Catapress (per Dr Nicholas Lose) - increase to bid - d/c Hydralazine prn per Dr Graciela Husbands d/c 3/19: Increase Inderal LA to bid   GERD (gastroesophageal reflux disease)    Glaucoma (increased eye pressure)    Heart murmur    HTN (hypertension)    IBS (irritable bowel syndrome)    LBP (low back pain)    Menopause    Osteoporosis    Ovarian cancer (HCC) 09/2008   Dr Janae Sauce   Pancreatic cyst    Vitamin B12 deficiency    Vitamin D deficiency  PAST SURGICAL HISTORY: Past Surgical History:  Procedure Laterality Date   ABDOMINAL HYSTERECTOMY     APPENDECTOMY     BLADDER SURGERY  04/09/2020   tack and sling   CHOLECYSTECTOMY N/A 05/15/2017   Procedure: LAPAROSCOPIC CHOLECYSTECTOMY WITH INTRAOPERATIVE CHOLANGIOGRAM AND LYSIS OF ADHESIONS;  Surgeon: Claud Kelp, MD;  Location: [facility];  Service: General;  Laterality: N/A;   COLONOSCOPY  2018   Dr.Perry   ELBOW FRACTURE SURGERY     age 51- left elbow   HEMORRHOID SURGERY  2001   Ovarian Cancer Debulking  09/2008   POLYPECTOMY      ALLERGIES: Allergies  Allergen Reactions   Boniva [Ibandronate Sodium]     cramp   Calcium Channel Blockers     Upset stomach   Chlorthalidone     Diarrhea per pt   Compazine     Daughter reacts to compazine/pt does not want to  take   Hyoscyamine     Dry mouth   Lyrica [Pregabalin]     Dizzy    Risedronate Sodium Other (See Comments)    Upset stomach   Tape Rash    redness    FAMILY HISTORY:  Family History  Problem Relation Age of Onset   Alzheimer's disease Mother 15   Other Mother 36       TAH for excessive bleeding   Lung cancer Father        smoker; metastasis to stomach and other areas   Heart attack Maternal Uncle    Heart disease Maternal Uncle    Other Paternal Aunt        stomach issues   Heart Problems Paternal Uncle    Other Maternal Grandmother        stomach issues; +hysterectomy   Heart attack Maternal Grandfather    Heart disease Maternal Grandfather    Infertility Daughter    Stomach cancer Cousin        dx. mid-60s   Other Cousin        stomach issues   Leukemia Cousin 18   Stomach cancer Other    Cancer Cousin        unknown type   Other Cousin        stomach issues   Heart Problems Maternal Uncle    Diabetes Paternal Aunt    Heart Problems Paternal Uncle    Emphysema Paternal Uncle        work exposure   Breast cancer Cousin        dx. late 60s-early 70s   Colon cancer Neg Hx    Rectal cancer Neg Hx    Esophageal cancer Neg Hx     SOCIAL HISTORY: Social History   Socioeconomic History   Marital status: Married    Spouse name: Jillyn Hidden   Number of children: 2   Years of education: BS   Highest education level: Bachelor's degree (e.g., BA, AB, BS)  Occupational History   Occupation: Dentist: HOMEMAKER  Tobacco Use   Smoking status: Never   Smokeless tobacco: Never  Vaping Use   Vaping status: Never Used  Substance and Sexual Activity   Alcohol use: No    Alcohol/week: 0.0 standard drinks of alcohol   Drug use: No   Sexual activity: Not on file  Other Topics Concern   Not on file  Social History Narrative   Lives with spouse   Caffeine use: cokes      Right handed    Social  Drivers of Health   Financial Resource Strain: Low Risk   (09/27/2023)   Overall Financial Resource Strain (CARDIA)    Difficulty of Paying Living Expenses: Not hard at all  Food Insecurity: No Food Insecurity (09/27/2023)   Hunger Vital Sign    Worried About Running Out of Food in the Last Year: Never true    Ran Out of Food in the Last Year: Never true  Transportation Needs: No Transportation Needs (09/27/2023)   PRAPARE - Administrator, Civil Service (Medical): No    Lack of Transportation (Non-Medical): No  Physical Activity: Inactive (09/27/2023)   Exercise Vital Sign    Days of Exercise per Week: 0 days    Minutes of Exercise per Session: 0 min  Stress: Stress Concern Present (09/27/2023)   [employer] of Occupational Health - Occupational Stress Questionnaire    Feeling of Stress : To some extent  Social Connections: Socially Integrated (09/27/2023)   Social Connection and Isolation Panel [NHANES]    Frequency of Communication with Friends and Family: More than three times a week    Frequency of Social Gatherings with Friends and Family: More than three times a week    Attends Religious Services: More than 4 times per year    Active Member of [employer] or Organizations: Yes    Attends Engineer, structural: More than 4 times per year    Marital Status: Married    MEDICATIONS:  Current Outpatient Medications  Medication Sig Dispense Refill   amLODipine (NORVASC) 5 MG tablet TAKE 1 TABLET (5 MG TOTAL) BY MOUTH DAILY. 90 tablet 2   aspirin 81 MG EC tablet Take 81 mg by mouth daily.     bimatoprost (LUMIGAN) 0.01 % SOLN Place 1 drop into both eyes at bedtime.     Blood Glucose Monitoring Suppl (ONETOUCH VERIO FLEX SYSTEM) w/Device KIT 1 each by Does not apply route in the morning, at noon, and at bedtime.      butalbital-acetaminophen-caffeine (FIORICET) 50-325-40 MG tablet Take 1 tablet by mouth every 6 (six) hours as needed for headache. 60 tablet 0   [facility] DIGESTIVE PROBIOTIC 250 MG capsule TAKE 1 CAPSULE BY MOUTH 2  TIMES DAILY. 50 capsule 2   DENTA 5000 PLUS 1.1 % CREA dental cream Take by mouth as directed.     diphenoxylate-atropine (LOMOTIL) 2.5-0.025 MG tablet Take 1 tablet by mouth 4 (four) times daily as needed for diarrhea or loose stools. 60 tablet 5   empagliflozin (JARDIANCE) 10 MG TABS tablet Take 1 tablet (10 mg total) by mouth daily with breakfast. 90 tablet 3   fluticasone (FLONASE) 50 MCG/ACT nasal spray Place 1 spray into both nostrils daily. 15.8 mL 0   furosemide (LASIX) 20 MG tablet Take 0.5-1 tablets (10-20 mg total) by mouth daily as needed for edema (for leg swelling). 90 tablet 1   glucose blood (ONETOUCH VERIO) test strip Check blood sugar alternating fasting and 2 hours after meals 200 strip 1   KLOR-CON M20 20 MEQ tablet TAKE 1 TABLET BY MOUTH EVERY DAY 90 tablet 3   Lancets (ONETOUCH DELICA PLUS LANCET30G) MISC Check blood sugar alternating fasting and 2 hours after meals 200 each 2   LORazepam (ATIVAN) 0.5 MG tablet Take 1 tablet (0.5 mg total) by mouth 2 (two) times daily as needed for anxiety. 60 tablet 2   methocarbamol (ROBAXIN) 500 MG tablet Take 1 tablet (500 mg total) by mouth every 8 (eight) hours as needed for muscle  spasms. 90 tablet 1   NASCOBAL 500 MCG/0.1ML SOLN USE 1 SPRAY IN 1 NOSTRIL ONCE A WEEK 12 each 3   oxybutynin (DITROPAN) 5 MG tablet TAKE 1 TABLET BY MOUTH THREE TIMES A DAY 270 tablet 1   PROLIA 60 MG/ML SOSY injection INJECT 1 SYRINGE UNDER THE SKIN ONCE EVERY 6 MONTHS 1 mL 0   propranolol ER (INDERAL LA) 60 MG 24 hr capsule TAKE 1 CAPSULE BY MOUTH 2 TIMES DAILY. 180 capsule 3   RABEprazole (ACIPHEX) 20 MG tablet Take 1 tablet (20 mg total) by mouth daily. 90 tablet 3   timolol (TIMOPTIC) 0.5 % ophthalmic solution Place 1 drop into both eyes 2 (two) times daily.     triamcinolone cream (KENALOG) 0.1 % APPLY TO AFFECTED AREA TWICE A DAY AS NEEDED     VITAMIN D3 1.25 MG (50000 UT) capsule TAKE ONE CAPSULE BY MOUTH EVERY 30 DAYS 9 capsule 1   rosuvastatin  (CRESTOR) 5 MG tablet TAKE 1 TABLET (5 MG TOTAL) BY MOUTH DAILY. (Patient not taking: Reported on 11/08/2023) 90 tablet 1   No current facility-administered medications for this visit.    PHYSICAL EXAM: Vitals:   11/08/23 1024  BP: 124/80  Pulse: 61  SpO2: 98%  Weight: 181 lb (82.1 kg)  Height: 5\' 4"  (1.626 m)   Body mass index is 31.07 kg/m.  Wt Readings from Last 3 Encounters:  11/08/23 181 lb (82.1 kg)  09/28/23 178 lb (80.7 kg)  09/13/23 177 lb 6.4 oz (80.5 kg)    General: Well developed, well nourished female in no apparent distress.  HEENT: AT/[place], no external lesions.  Eyes: Conjunctiva clear and no icterus. Neck: Neck supple  Lungs: Respirations not labored Neurologic: Alert, oriented, normal speech Extremities / Skin: Dry.  Psychiatric: Does not appear depressed. Some what Anxious.  Diabetic Foot Exam - Simple   No data filed     LABS Reviewed Lab Results  Component Value Date   HGBA1C 6.3 09/28/2023   HGBA1C 5.5 07/03/2023   HGBA1C 5.9 02/23/2023   Lab Results  Component Value Date   FRUCTOSAMINE 257 09/23/2019   Lab Results  Component Value Date   CHOL 115 11/28/2022   HDL 59.60 11/28/2022   LDLCALC 26 11/28/2022   LDLDIRECT 122.0 05/24/2022   TRIG 149.0 11/28/2022   CHOLHDL 2 11/28/2022   Lab Results  Component Value Date   MICRALBCREAT 4.8 11/24/2022   MICRALBCREAT 4.7 11/16/2021   Lab Results  Component Value Date   CREATININE 1.23 (H) 09/28/2023   Lab Results  Component Value Date   GFR 42.30 (L) 09/28/2023    ASSESSMENT / PLAN  1. Controlled type 2 diabetes mellitus with complication, without long-term current use of insulin (HCC)   2. Dyslipidemia      Diabetes Mellitus type 2, complicated by CKD, diabetic retinopathy. - Diabetic status / severity: Controlled.  Lab Results  Component Value Date   HGBA1C 6.3 09/28/2023    - Hemoglobin A1c goal : <7%  - Medications: No change.  I) continue Jardiance 10 mg daily.     Asked check blood sugar when symptomatic otherwise okay to check blood sugar in the morning fasting daily and occasionally at bedtime.  - Home glucose testing: In the morning fasting and occasionally at bedtime and as needed.  - Discussed/ Gave Hypoglycemia treatment plan.  # Consult : not required at this time.   # Annual urine for microalbuminuria/ creatinine ratio, no microalbuminuria currently.  CKD 3 following  with nephrology. Last  Lab Results  Component Value Date   MICRALBCREAT 4.8 11/24/2022    # Foot check nightly / neuropathy.  # Annual dilated diabetic eye exams.  [name] has diabetic retinopathy, following regularly with ophthalmology.  - Diet: Eat reasonable portion sizes to promote a healthy weight  2. Blood pressure  -  BP Readings from Last 1 Encounters:  11/08/23 124/80    - Control is  in target.  - No change in current plans.  3. Lipid status / Hyperlipidemia - Last  Lab Results  Component Value Date   LDLCALC 26 11/28/2022   -Currently not taking rosuvastatin.  Managed by primary care provider. -Recommend to be compliant with rosuvastatin.  Brandi "KAY" was seen today for follow-up.  Diagnoses and all orders for this visit:  Controlled type 2 diabetes mellitus with complication, without long-term current use of insulin (HCC) -     Microalbumin / creatinine urine ratio -     Hemoglobin A1c -     Lipid panel -     BASIC METABOLIC PANEL WITH GFR  Dyslipidemia -     Lipid panel   DISPOSITION Follow up in clinic in 4 months suggested.  Labs prior to follow-up visit as ordered.   All questions answered and patient verbalized understanding of the plan.  Brandi Yuvia Plant, MD [facility] Endocrinology [facility] [address] [place], [address] [place], [place] [address] Phone # [phone]  At least part of this note was generated using voice recognition software. Inadvertent word errors may have occurred, which were not recognized during the  proofreading process.

## 2023-11-22 ENCOUNTER — Other Ambulatory Visit (HOSPITAL_COMMUNITY): Payer: Self-pay

## 2023-11-22 NOTE — Telephone Encounter (Signed)
 Good afternoon!   Patient requesting to have this done through the [facility] instead of [facility]. Can we get a look at the benefits when doing doing this? Thanks in advance!

## 2023-11-23 NOTE — Telephone Encounter (Signed)
 Prolia VOB initiated via [email or web address]

## 2023-11-28 ENCOUNTER — Other Ambulatory Visit (HOSPITAL_COMMUNITY): Payer: Self-pay

## 2023-11-28 NOTE — Telephone Encounter (Signed)
 Pt ready for scheduling for PROLIA on or after : 11/28/23  Option# 1: Buy/Bill (Office supplied medication)  Out-of-pocket cost due at time of clinic visit: $35 + DEDUCTIBLE  Number of injection/visits approved: ---  Primary: MEDICARE Prolia co-insurance: 2% Admin fee co-insurance: 2%  Secondary: [employer] Prolia co-insurance:  Admin fee co-insurance:   Medical Benefit Details: Date Benefits were checked: 11/24/23 Deductible: $257 Met of $257 Required (MEDICARE),$350.76 Met of $400 Required ([place]) / Coinsurance: 2%/ Admin Fee: 2%  Prior Auth: N/A PA# Expiration Date:   # of doses approved: ----------------------------------------------------------------------- Option# 2- Med Obtained from pharmacy:  Pharmacy benefit: Copay $--- MUST FILL AT SPECIALTY PHARMACY (Paid to pharmacy) Admin Fee: $5 (Pay at clinic)  Prior Auth: APPROVED PA# [id number]  Expiration Date: 09/29/23-09/27/24  # of doses approved: 2   If patient wants fill through the pharmacy benefit please send prescription to: [employer], and include estimated need by date in rx notes. Pharmacy will ship medication directly to the office.  Patient NOT eligible for Prolia Copay Card. Copay Card can make patient's cost as little as $25. Link to apply: [email or web address]://[email or web address]  ** This summary of benefits is an estimation of the patient's out-of-pocket cost. Exact cost may very based on individual plan coverage.

## 2023-11-28 NOTE — Telephone Encounter (Signed)
 [name]

## 2023-12-06 ENCOUNTER — Encounter: Payer: Self-pay | Admitting: Internal Medicine

## 2023-12-28 ENCOUNTER — Ambulatory Visit: Payer: Medicare Other

## 2024-01-04 DIAGNOSIS — H25013 Cortical age-related cataract, bilateral: Secondary | ICD-10-CM | POA: Diagnosis not present

## 2024-01-04 DIAGNOSIS — H43813 Vitreous degeneration, bilateral: Secondary | ICD-10-CM | POA: Diagnosis not present

## 2024-01-04 DIAGNOSIS — R7303 Prediabetes: Secondary | ICD-10-CM | POA: Diagnosis not present

## 2024-01-04 DIAGNOSIS — H2513 Age-related nuclear cataract, bilateral: Secondary | ICD-10-CM | POA: Diagnosis not present

## 2024-01-04 DIAGNOSIS — H524 Presbyopia: Secondary | ICD-10-CM | POA: Diagnosis not present

## 2024-01-04 DIAGNOSIS — H40023 Open angle with borderline findings, high risk, bilateral: Secondary | ICD-10-CM | POA: Diagnosis not present

## 2024-01-08 ENCOUNTER — Encounter: Payer: Self-pay | Admitting: Internal Medicine

## 2024-01-08 ENCOUNTER — Ambulatory Visit (INDEPENDENT_AMBULATORY_CARE_PROVIDER_SITE_OTHER): Payer: Medicare Other | Admitting: Internal Medicine

## 2024-01-08 VITALS — BP 118/68 | HR 62 | Temp 97.6°F | Ht 64.0 in | Wt 177.8 lb

## 2024-01-08 DIAGNOSIS — M810 Age-related osteoporosis without current pathological fracture: Secondary | ICD-10-CM | POA: Diagnosis not present

## 2024-01-08 DIAGNOSIS — M7989 Other specified soft tissue disorders: Secondary | ICD-10-CM | POA: Insufficient documentation

## 2024-01-08 DIAGNOSIS — E559 Vitamin D deficiency, unspecified: Secondary | ICD-10-CM

## 2024-01-08 DIAGNOSIS — E538 Deficiency of other specified B group vitamins: Secondary | ICD-10-CM | POA: Diagnosis not present

## 2024-01-08 MED ORDER — CYANOCOBALAMIN 500 MCG/0.1ML NA SOLN: NASAL | 3 refills | Status: AC

## 2024-01-08 MED ORDER — DENOSUMAB 60 MG/ML ~~LOC~~ SOSY
60.0000 mg | PREFILLED_SYRINGE | Freq: Once | SUBCUTANEOUS | Status: AC
Start: 2024-01-08 — End: 2024-01-08
  Administered 2024-01-08: 60 mg via SUBCUTANEOUS

## 2024-01-08 NOTE — Assessment & Plan Note (Signed)
Prolia inj today

## 2024-01-08 NOTE — Assessment & Plan Note (Signed)
On nasal B12 -- [place]

## 2024-01-08 NOTE — Assessment & Plan Note (Signed)
 C/o R upper arm swelling off and on x months R/o DVT

## 2024-01-08 NOTE — Assessment & Plan Note (Signed)
 Vit D

## 2024-01-08 NOTE — Progress Notes (Signed)
 Subjective:  Patient ID: [name], female    DOB: 27-May-1946  Age: 78 y.o. MRN: [id number]  CC: Medical Management of Chronic Issues (3 Month Follow Up. Patient notes right upper arm swelling/pain. Patient has noticeable swelling in both arms with some discoloration present)   HPI MAYLI HANA presents for HTN, chronic GI issues, IBS, B12 def C/o R upper arm swelling off and on x months  Outpatient Medications Prior to Visit  Medication Sig Dispense Refill   amLODipine  (NORVASC ) 5 MG tablet TAKE 1 TABLET (5 MG TOTAL) BY MOUTH DAILY. 90 tablet 2   aspirin  81 MG EC tablet Take 81 mg by mouth daily.     bimatoprost  (LUMIGAN ) 0.01 % SOLN Place 1 drop into both eyes at bedtime.     Blood Glucose Monitoring Suppl (ONETOUCH VERIO FLEX SYSTEM) w/Device KIT 1 each by Does not apply route in the morning, at noon, and at bedtime.      butalbital -acetaminophen -caffeine  (FIORICET) 50-325-40 MG tablet Take 1 tablet by mouth every 6 (six) hours as needed for headache. 60 tablet 0   [facility] DIGESTIVE PROBIOTIC 250 MG capsule TAKE 1 CAPSULE BY MOUTH 2 TIMES DAILY. 50 capsule 2   DENTA 5000 PLUS 1.1 % CREA dental cream Take by mouth as directed.     diphenoxylate -atropine  (LOMOTIL ) 2.5-0.025 MG tablet Take 1 tablet by mouth 4 (four) times daily as needed for diarrhea or loose stools. 60 tablet 5   empagliflozin  (JARDIANCE ) 10 MG TABS tablet Take 1 tablet (10 mg total) by mouth daily with breakfast. 90 tablet 3   fluticasone  (FLONASE ) 50 MCG/ACT nasal spray Place 1 spray into both nostrils daily. 15.8 mL 0   furosemide  (LASIX ) 20 MG tablet Take 0.5-1 tablets (10-20 mg total) by mouth daily as needed for edema (for leg swelling). 90 tablet 1   glucose blood (ONETOUCH VERIO) test strip Check blood sugar alternating fasting and 2 hours after meals 200 strip 1   KLOR-CON  M20 20 MEQ tablet TAKE 1 TABLET BY MOUTH EVERY DAY 90 tablet 3   Lancets (ONETOUCH DELICA PLUS LANCET30G) MISC Check blood sugar  alternating fasting and 2 hours after meals 200 each 2   LORazepam  (ATIVAN ) 0.5 MG tablet Take 1 tablet (0.5 mg total) by mouth 2 (two) times daily as needed for anxiety. 60 tablet 2   methocarbamol  (ROBAXIN ) 500 MG tablet Take 1 tablet (500 mg total) by mouth every 8 (eight) hours as needed for muscle spasms. 90 tablet 1   oxybutynin  (DITROPAN ) 5 MG tablet TAKE 1 TABLET BY MOUTH THREE TIMES A DAY 270 tablet 1   PROLIA  60 MG/ML SOSY injection INJECT 1 SYRINGE UNDER THE SKIN ONCE EVERY 6 MONTHS 1 mL 0   propranolol  ER (INDERAL  LA) 60 MG 24 hr capsule TAKE 1 CAPSULE BY MOUTH 2 TIMES DAILY. 180 capsule 3   RABEprazole  (ACIPHEX ) 20 MG tablet Take 1 tablet (20 mg total) by mouth daily. 90 tablet 3   timolol  (TIMOPTIC ) 0.5 % ophthalmic solution Place 1 drop into both eyes 2 (two) times daily.     triamcinolone  cream (KENALOG) 0.1 % APPLY TO AFFECTED AREA TWICE A DAY AS NEEDED     VITAMIN D3 1.25 MG (50000 UT) capsule TAKE ONE CAPSULE BY MOUTH EVERY 30 DAYS 9 capsule 1   NASCOBAL  500 MCG/0.1ML SOLN USE 1 SPRAY IN 1 NOSTRIL ONCE A WEEK 12 each 3   rosuvastatin  (CRESTOR ) 5 MG tablet TAKE 1 TABLET (5 MG TOTAL) BY MOUTH  DAILY. (Patient not taking: Reported on 01/08/2024) 90 tablet 1   No facility-administered medications prior to visit.    ROS: Review of Systems  Constitutional:  Negative for activity change, appetite change, chills, fatigue and unexpected weight change.  HENT:  Negative for congestion, mouth sores and sinus pressure.   Eyes:  Negative for visual disturbance.  Respiratory:  Negative for cough and chest tightness.   Gastrointestinal:  Negative for abdominal pain and nausea.  Genitourinary:  Negative for difficulty urinating, frequency and vaginal pain.  Musculoskeletal:  Negative for back pain and gait problem.  Skin:  Negative for pallor and rash.  Neurological:  Negative for dizziness, tremors, weakness, numbness and headaches.  Hematological:  Bruises/bleeds easily.   Psychiatric/Behavioral:  Negative for confusion and sleep disturbance. The patient is nervous/anxious.     Objective:  BP 118/68   Pulse 62   Temp 97.6 F (36.4 C)   Ht 5\' 4"  (1.626 m)   Wt 177 lb 12.8 oz (80.6 kg)   SpO2 97%   BMI 30.52 kg/m   BP Readings from Last 3 Encounters:  01/08/24 118/68  11/08/23 124/80  09/28/23 110/80    Wt Readings from Last 3 Encounters:  01/08/24 177 lb 12.8 oz (80.6 kg)  11/08/23 181 lb (82.1 kg)  09/28/23 178 lb (80.7 kg)    Physical Exam Constitutional:      General: She is not in acute distress.    Appearance: She is well-developed. She is obese. She is not toxic-appearing.  HENT:     Head: Normocephalic.     Right Ear: External ear normal.     Left Ear: External ear normal.     Nose: Nose normal.  Eyes:     General:        Right eye: No discharge.        Left eye: No discharge.     Conjunctiva/sclera: Conjunctivae normal.     Pupils: Pupils are equal, round, and reactive to light.  Neck:     Thyroid : No thyromegaly.     Vascular: No JVD.     Trachea: No tracheal deviation.  Cardiovascular:     Rate and Rhythm: Normal rate and regular rhythm.     Heart sounds: Normal heart sounds.  Pulmonary:     Effort: No respiratory distress.     Breath sounds: No stridor. No wheezing.  Abdominal:     General: Bowel sounds are normal. There is no distension.     Palpations: Abdomen is soft. There is no mass.     Tenderness: There is no abdominal tenderness. There is no guarding or rebound.  Musculoskeletal:        General: No tenderness.     Cervical back: Normal range of motion and neck supple. No rigidity.  Lymphadenopathy:     Cervical: No cervical adenopathy.  Skin:    Findings: No erythema or rash.  Neurological:     Cranial Nerves: No cranial nerve deficit.     Motor: No abnormal muscle tone.     Coordination: Coordination normal.     Deep Tendon Reflexes: Reflexes normal.  Psychiatric:        Behavior: Behavior normal.         Thought Content: Thought content normal.        Judgment: Judgment normal.    B arms are nl, shoulder and elbows - nl Lab Results  Component Value Date   WBC 6.8 09/28/2023   HGB 13.4 09/28/2023  HCT 40.3 09/28/2023   PLT 241.0 09/28/2023   GLUCOSE 105 (H) 09/28/2023   CHOL 115 11/28/2022   TRIG 149.0 11/28/2022   HDL 59.60 11/28/2022   LDLDIRECT 122.0 05/24/2022   LDLCALC 26 11/28/2022   ALT 20 09/28/2023   AST 23 09/28/2023   NA 140 09/28/2023   K 3.8 09/28/2023   CL 103 09/28/2023   CREATININE 1.23 (H) 09/28/2023   BUN 27 (H) 09/28/2023   CO2 26 09/28/2023   TSH 2.10 09/28/2023   HGBA1C 6.3 09/28/2023   MICROALBUR 9.0 (H) 11/24/2022    No results found.  Assessment & Plan:   Problem List Items Addressed This Visit     B12 deficiency - Primary   On nasal B12 -- Nascobal       Vitamin D  deficiency   Vit D      Osteoporosis   Prolia  inj today      Arm swelling   C/o R upper arm swelling off and on x months R/o DVT      Relevant Orders   VAS US  UPPER EXTREMITY VENOUS DUPLEX      Meds ordered this encounter  Medications   Cyanocobalamin  (NASCOBAL ) 500 MCG/0.1ML SOLN    Sig: USE 1 SPRAY IN 1 NOSTRIL  ONCE A WEEK    Dispense:  12 each    Refill:  3   denosumab  (PROLIA ) injection 60 mg    Patient is enrolled in [facility] program for this medication and I have provided a copy of the Prolia  Medication Guide and Patient Brochure.:   No    I have reviewed with the patient the information in the Prolia  Medication Guide and Patient Counseling Chart including the serious risks of Prolia  and symptoms of each risk.:   Yes    I have advised the patient to seek medical attention if they have signs or symptoms of any of the serious risks.:   Yes      Follow-up: Return in about 3 months (around 04/09/2024) for a follow-up visit.  Anitra Barn, MD

## 2024-01-10 ENCOUNTER — Ambulatory Visit (HOSPITAL_COMMUNITY)
Admission: RE | Admit: 2024-01-10 | Discharge: 2024-01-10 | Disposition: A | Discharge status: Home / Self Care | Source: Ambulatory Visit | Attending: Internal Medicine | Admitting: Internal Medicine

## 2024-01-10 DIAGNOSIS — M7989 Other specified soft tissue disorders: Secondary | ICD-10-CM | POA: Insufficient documentation

## 2024-01-11 ENCOUNTER — Encounter: Payer: Self-pay | Admitting: Internal Medicine

## 2024-01-17 ENCOUNTER — Encounter: Payer: Self-pay | Admitting: Physician Assistant

## 2024-01-17 ENCOUNTER — Ambulatory Visit (INDEPENDENT_AMBULATORY_CARE_PROVIDER_SITE_OTHER): Admitting: Physician Assistant

## 2024-01-17 VITALS — BP 142/70 | HR 68 | Ht 61.5 in | Wt 181.0 lb

## 2024-01-17 DIAGNOSIS — Z860101 Personal history of adenomatous and serrated colon polyps: Secondary | ICD-10-CM | POA: Diagnosis not present

## 2024-01-17 DIAGNOSIS — K580 Irritable bowel syndrome with diarrhea: Secondary | ICD-10-CM | POA: Diagnosis not present

## 2024-01-17 DIAGNOSIS — Z860100 Personal history of colon polyps, unspecified: Secondary | ICD-10-CM

## 2024-01-17 MED ORDER — IBGARD 90 MG PO CPCR
2.0000 | ORAL_CAPSULE | Freq: Two times a day (BID) | ORAL | Status: AC
Start: 2024-01-17 — End: 2024-04-16

## 2024-01-17 MED ORDER — NA SULFATE-K SULFATE-MG SULF 17.5-3.13-1.6 GM/177ML PO SOLN
1.0000 | Freq: Once | ORAL | 0 refills | Status: AC
Start: 2024-01-17 — End: 2024-01-17

## 2024-01-17 MED ORDER — FIBERCON 625 MG PO TABS
1250.0000 mg | ORAL_TABLET | Freq: Two times a day (BID) | ORAL | Status: AC
Start: 2024-01-17 — End: 2024-07-15

## 2024-01-17 NOTE — Progress Notes (Unsigned)
 se     Brigitte Canard, PA-C [address] [place], [place]  [address] Phone: [phone]   Primary Care Physician: Genia Kettering, MD  Primary Gastroenterologist:  Brigitte Canard, PA-C / Legrand Puma, MD   Chief Complaint:  Followup Abd Pain, IBS       HPI:   [name] is a 78 y.o. female, estab. Pt. Of Dr. Elvin Hammer, returns for f/u Abd pain and IBS.  She has Hx of Colon Polyps.  01/2022 Last Abd / Pelvic CT w/ contrast: No acute abnormality.  09/2020 Last Colonoscopy by Dr. Elvin Hammer:  Excellent prep.  2 small adenomatous polyps removed.  High risk colon cancer surveillance: Personal history of adenoma ( 10 mm or greater in size) , High risk colon cancer surveillance: Personal history of multiple ( 3 or more) adenomas. Previous examinations 2001 ( Dr. [employer]in Corp) , 2005, 2007, 2012 ( Dr. Grandville Lax) , 2017 ( piecemeal transverse colon polyp, tattoo) , 2018  Current Outpatient Medications  Medication Sig Dispense Refill   amLODipine  (NORVASC ) 5 MG tablet TAKE 1 TABLET (5 MG TOTAL) BY MOUTH DAILY. 90 tablet 2   aspirin  81 MG EC tablet Take 81 mg by mouth daily.     bimatoprost  (LUMIGAN ) 0.01 % SOLN Place 1 drop into both eyes at bedtime.     Blood Glucose Monitoring Suppl (ONETOUCH VERIO FLEX SYSTEM) w/Device KIT 1 each by Does not apply route in the morning, at noon, and at bedtime.      butalbital -acetaminophen -caffeine  (FIORICET) 50-325-40 MG tablet Take 1 tablet by mouth every 6 (six) hours as needed for headache. 60 tablet 0   [employer] DIGESTIVE PROBIOTIC 250 MG capsule TAKE 1 CAPSULE BY MOUTH 2 TIMES DAILY. 50 capsule 2   Cyanocobalamin  (NASCOBAL ) 500 MCG/0.1ML SOLN USE 1 SPRAY IN 1 NOSTRIL  ONCE A WEEK 12 each 3   DENTA 5000 PLUS 1.1 % CREA dental cream Take by mouth as directed.     diphenoxylate -atropine  (LOMOTIL ) 2.5-0.025 MG tablet Take 1 tablet by mouth 4 (four) times daily as needed for diarrhea or loose stools. 60 tablet 5   empagliflozin  (JARDIANCE ) 10 MG TABS tablet Take 1  tablet (10 mg total) by mouth daily with breakfast. 90 tablet 3   fluticasone  (FLONASE ) 50 MCG/ACT nasal spray Place 1 spray into both nostrils daily. 15.8 mL 0   furosemide  (LASIX ) 20 MG tablet Take 0.5-1 tablets (10-20 mg total) by mouth daily as needed for edema (for leg swelling). 90 tablet 1   glucose blood (ONETOUCH VERIO) test strip Check blood sugar alternating fasting and 2 hours after meals 200 strip 1   KLOR-CON  M20 20 MEQ tablet TAKE 1 TABLET BY MOUTH EVERY DAY 90 tablet 3   Lancets (ONETOUCH DELICA PLUS LANCET30G) MISC Check blood sugar alternating fasting and 2 hours after meals 200 each 2   LORazepam  (ATIVAN ) 0.5 MG tablet Take 1 tablet (0.5 mg total) by mouth 2 (two) times daily as needed for anxiety. 60 tablet 2   methocarbamol  (ROBAXIN ) 500 MG tablet Take 1 tablet (500 mg total) by mouth every 8 (eight) hours as needed for muscle spasms. 90 tablet 1   oxybutynin  (DITROPAN ) 5 MG tablet TAKE 1 TABLET BY MOUTH THREE TIMES A DAY 270 tablet 1   PROLIA  60 MG/ML SOSY injection INJECT 1 SYRINGE UNDER THE SKIN ONCE EVERY 6 MONTHS 1 mL 0   propranolol  ER (INDERAL  LA) 60 MG 24 hr capsule TAKE 1 CAPSULE BY MOUTH 2 TIMES DAILY. 180  capsule 3   RABEprazole  (ACIPHEX ) 20 MG tablet Take 1 tablet (20 mg total) by mouth daily. 90 tablet 3   rosuvastatin  (CRESTOR ) 5 MG tablet TAKE 1 TABLET (5 MG TOTAL) BY MOUTH DAILY. (Patient not taking: Reported on 01/08/2024) 90 tablet 1   timolol  (TIMOPTIC ) 0.5 % ophthalmic solution Place 1 drop into both eyes 2 (two) times daily.     triamcinolone  cream (KENALOG) 0.1 % APPLY TO AFFECTED AREA TWICE A DAY AS NEEDED     VITAMIN D3 1.25 MG (50000 UT) capsule TAKE ONE CAPSULE BY MOUTH EVERY 30 DAYS 9 capsule 1   No current facility-administered medications for this visit.    Allergies as of 01/17/2024 - Review Complete 01/08/2024  Allergen Reaction Noted   Boniva [ibandronate sodium]  12/03/2010   Calcium  channel blockers  12/03/2010   Chlorthalidone   07/24/2019    Compazine  03/25/2011   Hyoscyamine   03/29/2023   Lyrica  [pregabalin ]  08/07/2012   Risedronate sodium Other (See Comments) 02/05/2013   Tape Rash 05/11/2017    Past Medical History:  Diagnosis Date   Adenomatous colon polyp 04/08/2011   Anemia    Anxiety    Cataract    Diabetes mellitus without complication (HCC)    "pre-diabetic" per pt   Essential hypertension 08/10/2007   Chronic  On Catapress (per Dr Ola Berger) - increase to bid - d/c Hydralazine  prn per Dr Rodolfo Clan d/c 3/19: Increase Inderal  LA to bid   GERD (gastroesophageal reflux disease)    Glaucoma (increased eye pressure)    Heart murmur    HTN (hypertension)    IBS (irritable bowel syndrome)    LBP (low back pain)    Menopause    Osteoporosis    Ovarian cancer (HCC) 09/2008   Dr Gae Jointer   Pancreatic cyst    Vitamin B12 deficiency    Vitamin D  deficiency     Past Surgical History:  Procedure Laterality Date   ABDOMINAL HYSTERECTOMY     APPENDECTOMY     BLADDER SURGERY  04/09/2020   tack and sling   CHOLECYSTECTOMY N/A 05/15/2017   Procedure: LAPAROSCOPIC CHOLECYSTECTOMY WITH INTRAOPERATIVE CHOLANGIOGRAM AND LYSIS OF ADHESIONS;  Surgeon: Boyce Byes, MD;  Location: [facility];  Service: General;  Laterality: N/A;   COLONOSCOPY  2018   Dr.Perry   ELBOW FRACTURE SURGERY     age 78- left elbow   HEMORRHOID SURGERY  2001   Ovarian Cancer Debulking  09/2008   POLYPECTOMY      Review of Systems:    All systems reviewed and negative except where noted in HPI.    Physical Exam:  There were no vitals taken for this visit. No LMP recorded. Patient has had a hysterectomy.  General: Well-nourished, well-developed in no acute distress.  Lungs: Clear to auscultation bilaterally. Non-labored. Heart: Regular rate and rhythm, no murmurs rubs or gallops.  Abdomen: Bowel sounds are normal; Abdomen is Soft; No hepatosplenomegaly, masses or hernias;  No Abdominal Tenderness; No guarding or rebound tenderness. Neuro: Alert  and oriented x 3.  Grossly intact.  Psych: Alert and cooperative, normal mood and affect.   Imaging Studies: VAS US  UPPER EXTREMITY VENOUS DUPLEX Result Date: 01/10/2024 UPPER VENOUS STUDY  Patient Name:  [name]  Date of Exam:   01/10/2024 Medical Rec #: [id number]          Accession #:    [id number] Date of Birth: [birth year]           Patient Gender: F Patient  Age:   29 years Exam Location:  [address] Procedure:      VAS US  UPPER EXTREMITY VENOUS DUPLEX Referring Phys: ALEKSEI [name] --------------------------------------------------------------------------------  Other Indications: Right upper arm swelling and pain off and on for months. Performing Technologist: Harless Lien RVT  Examination Guidelines: A complete evaluation includes B-mode imaging, spectral Doppler, color Doppler, and power Doppler as needed of all accessible portions of each vessel. Bilateral testing is considered an integral part of a complete examination. Limited examinations for reoccurring indications may be performed as noted.  Right Findings: +----------+------------+---------+-----------+----------+-------+ RIGHT     CompressiblePhasicitySpontaneousPropertiesSummary +----------+------------+---------+-----------+----------+-------+ IJV           Full       Yes       Yes                      +----------+------------+---------+-----------+----------+-------+ Subclavian               Yes       Yes                      +----------+------------+---------+-----------+----------+-------+ Axillary      Full       Yes       Yes                      +----------+------------+---------+-----------+----------+-------+ Brachial      Full       Yes       Yes                      +----------+------------+---------+-----------+----------+-------+ Radial        Full                                          +----------+------------+---------+-----------+----------+-------+ Ulnar          Full                                          +----------+------------+---------+-----------+----------+-------+ Cephalic      Full       Yes       Yes                      +----------+------------+---------+-----------+----------+-------+ Basilic       Full       Yes       Yes                      +----------+------------+---------+-----------+----------+-------+ Innominate               Yes       Yes                      +----------+------------+---------+-----------+----------+-------+  Left Findings: +----+------------+---------+-----------+----------+-------+ LEFTCompressiblePhasicitySpontaneousPropertiesSummary +----+------------+---------+-----------+----------+-------+ IJV                Yes       Yes                      +----+------------+---------+-----------+----------+-------+  Summary:  Right: No evidence of deep vein thrombosis in the upper extremity. No evidence of superficial vein thrombosis in the upper extremity.  Left: No evidence of thrombosis  in the subclavian.  *See table(s) above for measurements and observations.  Diagnosing physician: Angela Kell MD Electronically signed by Angela Kell MD on 01/10/2024 at 4:45:54 PM.    Final     Labs: CBC    Component Value Date/Time   WBC 6.8 09/28/2023 1425   RBC 4.60 09/28/2023 1425   HGB 13.4 09/28/2023 1425   HGB 11.9 03/21/2016 1046   HCT 40.3 09/28/2023 1425   HCT 35.6 03/21/2016 1046   PLT 241.0 09/28/2023 1425   PLT 191 03/21/2016 1046   MCV 87.7 09/28/2023 1425   MCV 86.3 03/21/2016 1046   MCH 28.9 02/03/2022 1611   MCHC 33.3 09/28/2023 1425   RDW 13.8 09/28/2023 1425   RDW 13.9 03/21/2016 1046   LYMPHSABS 1.1 09/28/2023 1425   LYMPHSABS 1.5 03/21/2016 1046   MONOABS 0.5 09/28/2023 1425   MONOABS 0.4 03/21/2016 1046   EOSABS 0.1 09/28/2023 1425   EOSABS 0.1 03/21/2016 1046   BASOSABS 0.1 09/28/2023 1425   BASOSABS 0.0 03/21/2016 1046    CMP     Component Value Date/Time    NA 140 09/28/2023 1425   NA 141 02/04/2013 0840   K 3.8 09/28/2023 1425   K 3.5 02/04/2013 0840   CL 103 09/28/2023 1425   CL 108 (H) 02/04/2013 0840   CO2 26 09/28/2023 1425   CO2 25 02/04/2013 0840   GLUCOSE 105 (H) 09/28/2023 1425   GLUCOSE 166 (H) 02/04/2013 0840   BUN 27 (H) 09/28/2023 1425   BUN 12.2 02/04/2013 0840   CREATININE 1.23 (H) 09/28/2023 1425   CREATININE 0.8 02/04/2013 0840   CALCIUM  9.6 09/28/2023 1425   CALCIUM  8.8 02/04/2013 0840   PROT 8.2 09/28/2023 1425   PROT 7.0 02/04/2013 0840   ALBUMIN 4.9 09/28/2023 1425   ALBUMIN 3.6 02/04/2013 0840   AST 23 09/28/2023 1425   AST 19 02/04/2013 0840   ALT 20 09/28/2023 1425   ALT 20 02/04/2013 0840   ALKPHOS 67 09/28/2023 1425   ALKPHOS 72 02/04/2013 0840   BILITOT 0.7 09/28/2023 1425   BILITOT 0.61 02/04/2013 0840   GFRNONAA >60 02/03/2022 1611   GFRAA >60 05/15/2017 1757       Assessment and Plan:   [name] is a 78 y.o. y/o female ***    Brigitte Canard, PA-C  Follow up ***  Legrand Puma, MD

## 2024-01-17 NOTE — Patient Instructions (Addendum)
 We have given you samples of the following medication to take: Ibgard take as directed  We have sent the following medications to your pharmacy for you to pick up at your convenience: Polycarbophil 625 MG tablet twice daily and Peppermint Oil 90 mg twice daily  For Irritable Bowel Syndrome / Colon Spasm / Abdominal Cramps: IB Gard (Peppermint Oil) - Over the Counter Take 2 capsules Twice daily  For Diarrhea: Start OTC Fiber Con Tablets. Take 2 Caplets with a full glass of water or other liquid (8 ounces) Twice daily.  Try Low FODMAP Diet.  You have been scheduled for a colonoscopy. Please follow written instructions given to you at your visit today.   If you use inhalers (even only as needed), please bring them with you on the day of your procedure.  DO NOT TAKE 7 DAYS PRIOR TO TEST- Trulicity (dulaglutide) Ozempic, Wegovy (semaglutide) Mounjaro (tirzepatide) Bydureon Bcise (exanatide extended release)  DO NOT TAKE 1 DAY PRIOR TO YOUR TEST Rybelsus (semaglutide) Adlyxin (lixisenatide) Victoza (liraglutide) Byetta (exanatide) ___________________________________________________________________________  Please follow up sooner if symptoms increase or worsen   Due to recent changes in healthcare laws, you may see the results of your imaging and laboratory studies on MyChart before your provider has had a chance to review them.  We understand that in some cases there may be results that are confusing or concerning to you. Not all laboratory results come back in the same time frame and the provider may be waiting for multiple results in order to interpret others.  Please give us  48 hours in order for your provider to thoroughly review all the results before contacting the office for clarification of your results.   _______________________________________________________  If your blood pressure at your visit was 140/90 or greater, please contact your primary care physician to follow up  on this.  _______________________________________________________  If you are age 78 or older, your body mass index should be between 23-30. Your Body mass index is 33.65 kg/m. If this is out of the aforementioned range listed, please consider follow up with your Primary Care Provider.  If you are age 33 or younger, your body mass index should be between 19-25. Your Body mass index is 33.65 kg/m. If this is out of the aformentioned range listed, please consider follow up with your Primary Care Provider.   ________________________________________________________  The [facility] providers would like to encourage you to use MYCHART to communicate with providers for non-urgent requests or questions.  Due to long hold times on the telephone, sending your provider a message by [facility] may be a faster and more efficient way to get a response.  Please allow 48 business hours for a response.  Please remember that this is for non-urgent requests.  _______________________________________________________ Thank you for trusting me with your gastrointestinal care!   Brigitte Canard, PA

## 2024-01-18 ENCOUNTER — Encounter: Payer: Self-pay | Admitting: Physician Assistant

## 2024-01-23 NOTE — Progress Notes (Signed)
 Noted

## 2024-02-26 ENCOUNTER — Encounter: Payer: Self-pay | Admitting: Internal Medicine

## 2024-02-26 ENCOUNTER — Ambulatory Visit: Admitting: Internal Medicine

## 2024-02-26 VITALS — BP 103/64 | HR 57 | Temp 98.4°F | Resp 17 | Ht 61.5 in | Wt 181.0 lb

## 2024-02-26 DIAGNOSIS — I10 Essential (primary) hypertension: Secondary | ICD-10-CM | POA: Diagnosis not present

## 2024-02-26 DIAGNOSIS — D122 Benign neoplasm of ascending colon: Secondary | ICD-10-CM

## 2024-02-26 DIAGNOSIS — Z860101 Personal history of adenomatous and serrated colon polyps: Secondary | ICD-10-CM

## 2024-02-26 DIAGNOSIS — Z860100 Personal history of colon polyps, unspecified: Secondary | ICD-10-CM

## 2024-02-26 DIAGNOSIS — D123 Benign neoplasm of transverse colon: Secondary | ICD-10-CM | POA: Diagnosis not present

## 2024-02-26 DIAGNOSIS — Z1211 Encounter for screening for malignant neoplasm of colon: Secondary | ICD-10-CM | POA: Diagnosis not present

## 2024-02-26 DIAGNOSIS — D120 Benign neoplasm of cecum: Secondary | ICD-10-CM

## 2024-02-26 DIAGNOSIS — K580 Irritable bowel syndrome with diarrhea: Secondary | ICD-10-CM

## 2024-02-26 DIAGNOSIS — F419 Anxiety disorder, unspecified: Secondary | ICD-10-CM | POA: Diagnosis not present

## 2024-02-26 MED ORDER — SODIUM CHLORIDE 0.9 % IV SOLN: 500.0000 mL | Freq: Once | INTRAVENOUS | Status: DC

## 2024-02-26 NOTE — Progress Notes (Signed)
 Transferred to PACU via stretcher, arousing, VSS.

## 2024-02-26 NOTE — Op Note (Signed)
 [facility] Patient Name: Tanetta Fuhriman Procedure Date: 02/26/2024 3:01 PM MRN: [id number] Endoscopist: Norleen SAILOR. Abran , MD, [id number] Age: 78 Referring MD:  Date of Birth: Jan 20, 1946 Gender: Female Account #: [id number] Procedure:                Colonoscopy with cold snare polypectomy x 4 Indications:              High risk colon cancer surveillance: Personal                            history of adenoma (10 mm or greater in size), High                            risk colon cancer surveillance personal history of                            multiple (3 or more) adenomas. Previous                            examinations 2001 [facility]); 2005, 2007, 2012 (DB);                            2017, 2018, 2022 Medicines:                Monitored Anesthesia Care Procedure:                Pre-Anesthesia Assessment:                           - Prior to the procedure, a History and Physical                            was performed, and patient medications and                            allergies were reviewed. The patient's tolerance of                            previous anesthesia was also reviewed. The risks                            and benefits of the procedure and the sedation                            options and risks were discussed with the patient.                            All questions were answered, and informed consent                            was obtained. Prior Anticoagulants: The patient has                            taken no anticoagulant or antiplatelet agents. ASA  Grade Assessment: II - A patient with mild systemic                            disease. After reviewing the risks and benefits,                            the patient was deemed in satisfactory condition to                            undergo the procedure.                           After obtaining informed consent, the colonoscope                            was passed under  direct vision. Throughout the                            procedure, the patient's blood pressure, pulse, and                            oxygen saturations were monitored continuously. The                            Olympus CF-HQ190L ([id number]) Colonoscope was                            introduced through the anus and advanced to the the                            cecum, identified by appendiceal orifice and                            ileocecal valve. The ileocecal valve, appendiceal                            orifice, and rectum were photographed. The quality                            of the bowel preparation was excellent. The                            colonoscopy was performed without difficulty. The                            patient tolerated the procedure well. The bowel                            preparation used was SUPREP via split dose                            instruction. Scope In: 3:42:56 PM Scope Out: 4:01:54 PM Scope Withdrawal Time: 0 hours 16 minutes 4 seconds  Total Procedure Duration: 0 hours 18 minutes 58 seconds  Findings:                 Four sessile polyps were found in the transverse                            colon, ascending colon and cecum. The polyps were 3                            to 5 mm in size. 2 of the polyps were residual                            adenomatous appearing tissue in the region of prior                            transverse colon EMR. See images pre and post                            resection. All of these polyps were removed with a                            cold snare. Resection and retrieval were complete.                           The exam was otherwise without abnormality on                            direct and retroflexion views. Complications:            No immediate complications. Estimated blood loss:                            None. Estimated Blood Loss:     Estimated blood loss: none. Impression:               - Four 3 to 5  mm polyps in the transverse colon, in                            the ascending colon and in the cecum, removed with                            a cold snare. Resected and retrieved.                           - The examination was otherwise normal on direct                            and retroflexion views. Recommendation:           - Repeat colonoscopy in 3 years for surveillance to                            be considered based on overall health and  motivation.                           - Patient has a contact number available for                            emergencies. The signs and symptoms of potential                            delayed complications were discussed with the                            patient. Return to normal activities tomorrow.                            Written discharge instructions were provided to the                            patient.                           - Resume previous diet.                           - Continue present medications.                           - Await pathology results. Norleen SAILOR. Abran, MD 02/26/2024 4:12:02 PM This report has been signed electronically.

## 2024-02-26 NOTE — Patient Instructions (Addendum)
 Repeat colonoscopy in 3 years for surveillance to be considered based on overall health and motivation.                            Continue your normal medications Please read over handout provided                            YOU HAD AN ENDOSCOPIC PROCEDURE TODAY AT THE [facility]:   Refer to the procedure report that was given to you for any specific questions about what was found during the examination.  If the procedure report does not answer your questions, please call your gastroenterologist to clarify.  If you requested that your care partner not be given the details of your procedure findings, then the procedure report has been included in a sealed envelope for you to review at your convenience later.  YOU SHOULD EXPECT: Some feelings of bloating in the abdomen. Passage of more gas than usual.  Walking can help get rid of the air that was put into your GI tract during the procedure and reduce the bloating. If you had a lower endoscopy (such as a colonoscopy or flexible sigmoidoscopy) you may notice spotting of blood in your stool or on the toilet paper. If you underwent a bowel prep for your procedure, you may not have a normal bowel movement for a few days.  Please Note:  You might notice some irritation and congestion in your nose or some drainage.  This is from the oxygen used during your procedure.  There is no need for concern and it should clear up in a day or so.  SYMPTOMS TO REPORT IMMEDIATELY:  Following lower endoscopy (colonoscopy or flexible sigmoidoscopy):  Excessive amounts of blood in the stool  Significant tenderness or worsening of abdominal pains  Swelling of the abdomen that is new, acute  Fever of 100F or higher  For urgent or emergent issues, a gastroenterologist can be reached at any hour by calling [phone]. Do not use MyChart messaging for urgent concerns.    DIET:  We do recommend a small meal at first, but then you may proceed to your  regular diet.  Drink plenty of fluids but you should avoid alcoholic beverages for 24 hours.  ACTIVITY:  You should plan to take it easy for the rest of today and you should NOT DRIVE or use heavy machinery until tomorrow (because of the sedation medicines used during the test).    FOLLOW UP: Our staff will call the number listed on your records the next business day following your procedure.  We will call around 7:15- 8:00 am to check on you and address any questions or concerns that you may have regarding the information given to you following your procedure. If we do not reach you, we will leave a message.     If any biopsies were taken you will be contacted by phone or by letter within the next 1-3 weeks.  Please call us  at [phone] if you have not heard about the biopsies in 3 weeks.    SIGNATURES/CONFIDENTIALITY: You and/or your care partner have signed paperwork which will be entered into your electronic medical record.  These signatures attest to the fact that that the information above on your After Visit Summary has been reviewed and is understood.  Full responsibility of the confidentiality of this discharge information lies with  you and/or your care-partner.

## 2024-02-26 NOTE — Progress Notes (Signed)
 Expand All Collapse All se     Ellouise Console, PA-C [address] [place], [place]  [address] Phone: [phone]     Primary Care Physician: Garald Karlynn GAILS, MD   Primary Gastroenterologist:  Ellouise Console, PA-C / Norleen Kiang, MD    Chief Complaint:  Followup Abd Pain, IBS        HPI:   [name] is a 78 y.o. female, estab. Pt. Of Dr. Kiang, returns for f/u Abd pain and IBS.  She has Hx of Colon Polyps.  She has had chronic intermittent bilateral lower abdominal cramping for many years.  History of oophorectomy for ovarian cancer many years ago.  Also thought to have scar tissue causing chronic lower abdominal pain.  She has had bladder prolapse surgery.  She reports occasional episodes of bowel and bladder incontinence.  Has overactive bladder.  Anxiety triggers her GI symptoms.  She has occasional episodes of diarrhea when she is anxious.  Takes Lomotil  as needed which helps.  Also takes OTC probiotic with benefit.  She denies melena, hematochezia, or weight loss.   01/2022 Last Abd / Pelvic CT w/ contrast: No acute abnormality.   09/2020 Last Colonoscopy by Dr. Kiang:  Excellent prep.  2 small adenomatous polyps removed.   High risk colon cancer surveillance: Personal history of adenoma ( 10 mm or greater in size) , High risk colon cancer surveillance: Personal history of multiple ( 3 or more) adenomas. Previous examinations 2001 ( Dr. Cloretta) , 2005, 2007, 2012 ( Dr. Obie) , 2017 ( piecemeal transverse colon polyp, tattoo) , 2018         Current Outpatient Medications  Medication Sig Dispense Refill   amLODipine  (NORVASC ) 5 MG tablet TAKE 1 TABLET (5 MG TOTAL) BY MOUTH DAILY. 90 tablet 2   aspirin  81 MG EC tablet Take 81 mg by mouth daily.       bimatoprost  (LUMIGAN ) 0.01 % SOLN Place 1 drop into both eyes at bedtime.       Blood Glucose Monitoring Suppl (ONETOUCH VERIO FLEX SYSTEM) w/Device KIT 1 each by Does not apply route in the morning, at noon, and at bedtime.         butalbital -acetaminophen -caffeine  (FIORICET) 50-325-40 MG tablet Take 1 tablet by mouth every 6 (six) hours as needed for headache. 60 tablet 0   [employer] DIGESTIVE PROBIOTIC 250 MG capsule TAKE 1 CAPSULE BY MOUTH 2 TIMES DAILY. 50 capsule 2   Cyanocobalamin  (NASCOBAL ) 500 MCG/0.1ML SOLN USE 1 SPRAY IN 1 NOSTRIL  ONCE A WEEK 12 each 3   DENTA 5000 PLUS 1.1 % CREA dental cream Take by mouth as directed.       diphenoxylate -atropine  (LOMOTIL ) 2.5-0.025 MG tablet Take 1 tablet by mouth 4 (four) times daily as needed for diarrhea or loose stools. 60 tablet 5   empagliflozin  (JARDIANCE ) 10 MG TABS tablet Take 1 tablet (10 mg total) by mouth daily with breakfast. 90 tablet 3   fluticasone  (FLONASE ) 50 MCG/ACT nasal spray Place 1 spray into both nostrils daily. 15.8 mL 0   furosemide  (LASIX ) 20 MG tablet Take 0.5-1 tablets (10-20 mg total) by mouth daily as needed for edema (for leg swelling). 90 tablet 1   glucose blood (ONETOUCH VERIO) test strip Check blood sugar alternating fasting and 2 hours after meals 200 strip 1   Lancets (ONETOUCH DELICA PLUS LANCET30G) MISC Check blood sugar alternating fasting and 2 hours after meals 200 each 2   LORazepam  (ATIVAN ) 0.5 MG tablet  Take 1 tablet (0.5 mg total) by mouth 2 (two) times daily as needed for anxiety. 60 tablet 2   methocarbamol  (ROBAXIN ) 500 MG tablet Take 1 tablet (500 mg total) by mouth every 8 (eight) hours as needed for muscle spasms. 90 tablet 1   oxybutynin  (DITROPAN ) 5 MG tablet TAKE 1 TABLET BY MOUTH THREE TIMES A DAY 270 tablet 1   Peppermint Oil (IBGARD) 90 MG CPCR Take 2 capsules by mouth 2 (two) times daily.       polycarbophil (FIBERCON) 625 MG tablet Take 2 tablets (1,250 mg total) by mouth 2 (two) times daily.       PROLIA  60 MG/ML SOSY injection INJECT 1 SYRINGE UNDER THE SKIN ONCE EVERY 6 MONTHS 1 mL 0   propranolol  ER (INDERAL  LA) 60 MG 24 hr capsule TAKE 1 CAPSULE BY MOUTH 2 TIMES DAILY. 180 capsule 3   RABEprazole  (ACIPHEX ) 20 MG  tablet Take 1 tablet (20 mg total) by mouth daily. 90 tablet 3   rosuvastatin  (CRESTOR ) 5 MG tablet TAKE 1 TABLET (5 MG TOTAL) BY MOUTH DAILY. 90 tablet 1   timolol  (TIMOPTIC ) 0.5 % ophthalmic solution Place 1 drop into both eyes 2 (two) times daily.       triamcinolone  cream (KENALOG) 0.1 % APPLY TO AFFECTED AREA TWICE A DAY AS NEEDED       VITAMIN D3 1.25 MG (50000 UT) capsule TAKE ONE CAPSULE BY MOUTH EVERY 30 DAYS 9 capsule 1   KLOR-CON  M20 20 MEQ tablet TAKE 1 TABLET BY MOUTH EVERY DAY (Patient not taking: Reported on 01/17/2024) 90 tablet 3      No current facility-administered medications for this visit.             Allergies as of 01/17/2024 - Review Complete 01/08/2024  Allergen Reaction Noted   Boniva [ibandronate sodium]   12/03/2010   Calcium  channel blockers   12/03/2010   Chlorthalidone    07/24/2019   Compazine   03/25/2011   Hyoscyamine    03/29/2023   Lyrica  [pregabalin ]   08/07/2012   Risedronate sodium Other (See Comments) 02/05/2013   Tape Rash 05/11/2017          Past Medical History:  Diagnosis Date   Adenomatous colon polyp 04/08/2011   Anemia     Anxiety     Cataract     Diabetes mellitus without complication (HCC)      pre-diabetic per pt   Essential hypertension 08/10/2007    Chronic  On Catapress (per Dr Cary) - increase to bid - d/c Hydralazine  prn per Dr Fernande d/c 3/19: Increase Inderal  LA to bid   GERD (gastroesophageal reflux disease)     Glaucoma (increased eye pressure)     Heart murmur     HTN (hypertension)     IBS (irritable bowel syndrome)     LBP (low back pain)     Menopause     Osteoporosis     Ovarian cancer (HCC) 09/2008    Dr Cecil   Pancreatic cyst     Vitamin B12 deficiency     Vitamin D  deficiency                 Past Surgical History:  Procedure Laterality Date   ABDOMINAL HYSTERECTOMY       APPENDECTOMY       BLADDER SURGERY   04/09/2020    tack and sling   CHOLECYSTECTOMY N/A 05/15/2017    Procedure:  LAPAROSCOPIC CHOLECYSTECTOMY WITH INTRAOPERATIVE CHOLANGIOGRAM AND LYSIS  OF ADHESIONS;  Surgeon: Gail Favorite, MD;  Location: [facility];  Service: General;  Laterality: N/A;   COLONOSCOPY   2018    Dr.Kevina Piloto   ELBOW FRACTURE SURGERY        age 64- left elbow   HEMORRHOID SURGERY   2001   Ovarian Cancer Debulking   09/2008   POLYPECTOMY              Review of Systems:    All systems reviewed and negative except where noted in HPI.      Physical Exam:  BP (!) 142/70 (BP Location: Left Arm, Patient Position: Sitting, Cuff Size: Large)   Pulse 68   Ht 5' 1.5 (1.562 m) Comment: height measured without shoes  Wt 181 lb (82.1 kg)   BMI 33.65 kg/m  No LMP recorded. Patient has had a hysterectomy.   General: Well-nourished, well-developed in no acute distress.  Lungs: Clear to auscultation bilaterally. Non-labored. Heart: Regular rate and rhythm, no murmurs rubs or gallops.  Abdomen: Bowel sounds are normal; Abdomen is Soft; No hepatosplenomegaly, masses or hernias; mild bilateral lower abdominal Tenderness; no upper abdominal tenderness.  No guarding or rebound tenderness. Neuro: Alert and oriented x 3.  Grossly intact.  Psych: Alert and cooperative, normal mood and affect.     Imaging Studies: None.   Labs: CBC Labs (Brief)          Component Value Date/Time    WBC 6.8 09/28/2023 1425    RBC 4.60 09/28/2023 1425    HGB 13.4 09/28/2023 1425    HGB 11.9 03/21/2016 1046    HCT 40.3 09/28/2023 1425    HCT 35.6 03/21/2016 1046    PLT 241.0 09/28/2023 1425    PLT 191 03/21/2016 1046    MCV 87.7 09/28/2023 1425    MCV 86.3 03/21/2016 1046    MCH 28.9 02/03/2022 1611    MCHC 33.3 09/28/2023 1425    RDW 13.8 09/28/2023 1425    RDW 13.9 03/21/2016 1046    LYMPHSABS 1.1 09/28/2023 1425    LYMPHSABS 1.5 03/21/2016 1046    MONOABS 0.5 09/28/2023 1425    MONOABS 0.4 03/21/2016 1046    EOSABS 0.1 09/28/2023 1425    EOSABS 0.1 03/21/2016 1046    BASOSABS 0.1 09/28/2023 1425     BASOSABS 0.0 03/21/2016 1046        CMP     Labs (Brief)          Component Value Date/Time    NA 140 09/28/2023 1425    NA 141 02/04/2013 0840    K 3.8 09/28/2023 1425    K 3.5 02/04/2013 0840    CL 103 09/28/2023 1425    CL 108 (H) 02/04/2013 0840    CO2 26 09/28/2023 1425    CO2 25 02/04/2013 0840    GLUCOSE 105 (H) 09/28/2023 1425    GLUCOSE 166 (H) 02/04/2013 0840    BUN 27 (H) 09/28/2023 1425    BUN 12.2 02/04/2013 0840    CREATININE 1.23 (H) 09/28/2023 1425    CREATININE 0.8 02/04/2013 0840    CALCIUM  9.6 09/28/2023 1425    CALCIUM  8.8 02/04/2013 0840    PROT 8.2 09/28/2023 1425    PROT 7.0 02/04/2013 0840    ALBUMIN 4.9 09/28/2023 1425    ALBUMIN 3.6 02/04/2013 0840    AST 23 09/28/2023 1425    AST 19 02/04/2013 0840    ALT 20 09/28/2023 1425    ALT 20 02/04/2013 0840  ALKPHOS 67 09/28/2023 1425    ALKPHOS 72 02/04/2013 0840    BILITOT 0.7 09/28/2023 1425    BILITOT 0.61 02/04/2013 0840    GFRNONAA >60 02/03/2022 1611    GFRAA >60 05/15/2017 1757              Assessment and Plan:    [name] is a 78 y.o. y/o female presents for follow-up of:   1.  Irritable bowel syndrome, with episodes of diarrhea; chronic for many years - Start OTC IBgard 2 capsules twice daily, samples given. - Start OTC FiberCon tablets, 2 tablets twice daily. - Low FODMAP diet given. - Take Lomotil  or OTC Imodium sparingly as needed. - If symptoms worsen or persist, consider trial of Xifaxan .   2.  History of adenomatous colon polyps - Scheduling repeat surveillance colonoscopy I discussed risks of colonoscopy with patient to include risk of bleeding, colon perforation, and risk of sedation.  Patient expressed understanding and agrees to proceed with colonoscopy.    Ellouise Console, PA-C   Follow up if symptoms worsen or fail to improve.  Norleen Kiang, MD   Recent H&P as above.  No significant interval clinical change.  Now for colonoscopy

## 2024-02-26 NOTE — Progress Notes (Signed)
 Called to room to assist during endoscopic procedure.  Patient ID and intended procedure confirmed with present staff. Received instructions for my participation in the procedure from the performing physician.

## 2024-02-27 ENCOUNTER — Telehealth: Payer: Self-pay | Admitting: *Deleted

## 2024-02-27 NOTE — Telephone Encounter (Signed)
  Follow up Call-     02/26/2024    3:10 PM  Call back number  Post procedure Call Back phone  # [phone]  Permission to leave phone message Yes     Patient questions:  Do you have a fever, pain , or abdominal swelling? No. Pain Score  0 *  Have you tolerated food without any problems? Yes.    Have you been able to return to your normal activities? Yes.    Do you have any questions about your discharge instructions: Diet   No. Medications  No. Follow up visit  No.  Do you have questions or concerns about your Care? No.  Actions: * If pain score is 4 or above: No action needed, pain <4.

## 2024-02-29 ENCOUNTER — Other Ambulatory Visit: Payer: Self-pay | Admitting: Internal Medicine

## 2024-02-29 DIAGNOSIS — Z1231 Encounter for screening mammogram for malignant neoplasm of breast: Secondary | ICD-10-CM

## 2024-03-01 ENCOUNTER — Ambulatory Visit: Payer: Self-pay | Admitting: Internal Medicine

## 2024-03-01 LAB — SURGICAL PATHOLOGY

## 2024-03-13 ENCOUNTER — Ambulatory Visit

## 2024-03-13 VITALS — Ht 61.5 in | Wt 181.0 lb

## 2024-03-13 DIAGNOSIS — Z0000 Encounter for general adult medical examination without abnormal findings: Secondary | ICD-10-CM | POA: Diagnosis not present

## 2024-03-13 DIAGNOSIS — M810 Age-related osteoporosis without current pathological fracture: Secondary | ICD-10-CM

## 2024-03-13 NOTE — Patient Instructions (Signed)
 [name] , Thank you for taking time out of your busy schedule to complete your Annual Wellness Visit with me. I enjoyed our conversation and look forward to speaking with you again next year. I, as well as your care team,  appreciate your ongoing commitment to your health goals. Please review the following plan we discussed and let me know if I can assist you in the future. Your Game plan/ To Do List    Referrals: If you haven't heard from the office you've been referred to, please reach out to them at the phone provided.  You have an order for:  [x]   Bone Density     Please call for appointment:  [facility] [address] [place], [place] [address] [phone]    Make sure to wear two-piece clothing.  No lotions, powders, or deodorants the day of the appointment. Make sure to bring picture ID and insurance card.  Bring list of medications you are currently taking including any supplements.   Follow up Visits: Next Medicare AWV with our clinical staff: 03/17/2025.   Have you seen your provider in the last 6 months (3 months if uncontrolled diabetes)? Yes Next Office Visit with your provider: 04/09/2024.  Clinician Recommendations:  Aim for 30 minutes of exercise or brisk walking, 6-8 glasses of water, and 5 servings of fruits and vegetables each day. You are due for a tetanus vaccine and can get that done at your local pharmacy.  You are also due for a foot exam, a urine and Hep C screening, which will be done during your next office visit.        This is a list of the screening recommended for you and due dates:  Health Maintenance  Topic Date Due   Complete foot exam   Never done   Yearly kidney health urinalysis for diabetes  Never done   Hepatitis C Screening  Never done   DTaP/Tdap/Td vaccine (2 - Tdap) 02/06/2023   COVID-19 Vaccine (4 - 2024-25 season) 05/07/2023   Eye exam for diabetics  12/19/2023   Medicare Annual Wellness Visit  12/27/2023    Hemoglobin A1C  03/27/2024   Yearly kidney function blood test for diabetes  09/27/2024   Colon Cancer Screening  02/26/2027   Pneumococcal Vaccine for age over 51  Completed   DEXA scan (bone density measurement)  Completed   Hepatitis B Vaccine  Aged Out   HPV Vaccine  Aged Out   Meningitis B Vaccine  Aged Out   Flu Shot  Discontinued   Zoster (Shingles) Vaccine  Discontinued    Advanced directives: (Copy Requested) Please bring a copy of your health care power of attorney and living will to the office to be added to your chart at your convenience. You can mail to [facility] [address] [place], [place] [address] or email to [email or web address] Advance Care Planning is important because it:  [x]  Makes sure you receive the medical care that is consistent with your values, goals, and preferences  [x]  It provides guidance to your family and loved ones and reduces their decisional burden about whether or not they are making the right decisions based on your wishes.  Follow the link provided in your after visit summary or read over the paperwork we have mailed to you to help you started getting your Advance Directives in place. If you need assistance in completing these, please reach out to us  so  that we can help you!  See attachments for Preventive Care and Fall Prevention Tips.

## 2024-03-13 NOTE — Progress Notes (Addendum)
 Subjective:   [name] is a 78 y.o. who presents for a Medicare Wellness preventive visit.  As a reminder, Annual Wellness Visits don't include a physical exam, and some assessments may be limited, especially if this visit is performed virtually. We may recommend an in-person follow-up visit with your provider if needed.  Visit Complete: Virtual I connected with  [name] on 03/13/24 by a audio enabled telemedicine application and verified that I am speaking with the correct person using two identifiers.  Patient Location: Home  Provider Location: Home Office  I discussed the limitations of evaluation and management by telemedicine. The patient expressed understanding and agreed to proceed.  Vital Signs: Because this visit was a virtual/telehealth visit, some criteria may be missing or patient reported. Any vitals not documented were not able to be obtained and vitals that have been documented are patient reported.  VideoDeclined- This patient declined Librarian, academic. Therefore the visit was completed with audio only.  Persons Participating in Visit: Patient.  AWV Questionnaire: Yes: Patient Medicare AWV questionnaire was completed by the patient on 03/12/2024; I have confirmed that all information answered by patient is correct and no changes since this date.  Cardiac Risk Factors include: advanced age (>76men, >22 women);hypertension;diabetes mellitus     Objective:    Today's Vitals   03/13/24 1032  Weight: 181 lb (82.1 kg)  Height: 5' 1.5 (1.562 m)   Body mass index is 33.65 kg/m.     03/13/2024   10:57 AM 12/27/2022    1:52 PM 02/03/2022    3:44 PM 12/31/2021    9:34 AM 01/05/2021    5:16 PM 12/23/2020    4:11 PM 10/16/2018    1:32 PM  Advanced Directives  Does Patient Have a Medical Advance Directive? Yes No Yes Yes Yes Yes Yes   Type of Estate agent of [place];Living will  Healthcare Power of  [place];Living will Healthcare Power of [place];Living will Healthcare Power of [place];Living will Living will;Healthcare Power of [employer] Power of [place];Living will  Does patient want to make changes to medical advance directive?      No - Patient declined No - Patient declined   Copy of Healthcare Power of Attorney in Chart? No - copy requested   No - copy requested No - copy requested No - copy requested No - copy requested   Would patient like information on creating a medical advance directive?  No - Patient declined          Data saved with a previous flowsheet row definition    Current Medications (verified) Outpatient Encounter Medications as of 03/13/2024  Medication Sig   amLODipine  (NORVASC ) 5 MG tablet TAKE 1 TABLET (5 MG TOTAL) BY MOUTH DAILY.   aspirin  81 MG EC tablet Take 81 mg by mouth daily.   bimatoprost  (LUMIGAN ) 0.01 % SOLN Place 1 drop into both eyes at bedtime.   Blood Glucose Monitoring Suppl (ONETOUCH VERIO FLEX SYSTEM) w/Device KIT 1 each by Does not apply route in the morning, at noon, and at bedtime.    butalbital -acetaminophen -caffeine  (FIORICET) 50-325-40 MG tablet Take 1 tablet by mouth every 6 (six) hours as needed for headache.   [employer] DIGESTIVE PROBIOTIC 250 MG capsule TAKE 1 CAPSULE BY MOUTH 2 TIMES DAILY.   Cyanocobalamin  (NASCOBAL ) 500 MCG/0.1ML SOLN USE 1 SPRAY IN 1 NOSTRIL  ONCE A WEEK   DENTA 5000 PLUS 1.1 % CREA dental cream Take by mouth as directed.  diphenoxylate -atropine  (LOMOTIL ) 2.5-0.025 MG tablet Take 1 tablet by mouth 4 (four) times daily as needed for diarrhea or loose stools.   empagliflozin  (JARDIANCE ) 10 MG TABS tablet Take 1 tablet (10 mg total) by mouth daily with breakfast.   fluticasone  (FLONASE ) 50 MCG/ACT nasal spray Place 1 spray into both nostrils daily.   furosemide  (LASIX ) 20 MG tablet Take 0.5-1 tablets (10-20 mg total) by mouth daily as needed for edema (for leg swelling).   glucose blood (ONETOUCH VERIO) test  strip Check blood sugar alternating fasting and 2 hours after meals   Lancets (ONETOUCH DELICA PLUS LANCET30G) MISC Check blood sugar alternating fasting and 2 hours after meals   LORazepam  (ATIVAN ) 0.5 MG tablet Take 1 tablet (0.5 mg total) by mouth 2 (two) times daily as needed for anxiety.   methocarbamol  (ROBAXIN ) 500 MG tablet Take 1 tablet (500 mg total) by mouth every 8 (eight) hours as needed for muscle spasms.   oxybutynin  (DITROPAN ) 5 MG tablet TAKE 1 TABLET BY MOUTH THREE TIMES A DAY   Peppermint Oil (IBGARD) 90 MG CPCR Take 2 capsules by mouth 2 (two) times daily.   polycarbophil (FIBERCON) 625 MG tablet Take 2 tablets (1,250 mg total) by mouth 2 (two) times daily.   PROLIA  60 MG/ML SOSY injection INJECT 1 SYRINGE UNDER THE SKIN ONCE EVERY 6 MONTHS   propranolol  ER (INDERAL  LA) 60 MG 24 hr capsule TAKE 1 CAPSULE BY MOUTH 2 TIMES DAILY.   RABEprazole  (ACIPHEX ) 20 MG tablet Take 1 tablet (20 mg total) by mouth daily.   timolol  (TIMOPTIC ) 0.5 % ophthalmic solution Place 1 drop into both eyes 2 (two) times daily.   triamcinolone  cream (KENALOG) 0.1 % APPLY TO AFFECTED AREA TWICE A DAY AS NEEDED   VITAMIN D3 1.25 MG (50000 UT) capsule TAKE ONE CAPSULE BY MOUTH EVERY 30 DAYS   KLOR-CON  M20 20 MEQ tablet TAKE 1 TABLET BY MOUTH EVERY DAY (Patient not taking: Reported on 03/13/2024)   rosuvastatin  (CRESTOR ) 5 MG tablet TAKE 1 TABLET (5 MG TOTAL) BY MOUTH DAILY. (Patient not taking: Reported on 03/13/2024)   No facility-administered encounter medications on file as of 03/13/2024.    Allergies (verified) Boniva [ibandronate sodium], Calcium  channel blockers, Chlorthalidone , Compazine, Hyoscyamine , Lyrica  [pregabalin ], Risedronate sodium, and Tape   History: Past Medical History:  Diagnosis Date   Adenomatous colon polyp 04/08/2011   Anemia    Anxiety    Cataract    Diabetes mellitus without complication (HCC)    pre-diabetic per pt   Essential hypertension 08/10/2007   Chronic  On  Catapress (per Dr Cary) - increase to bid - d/c Hydralazine  prn per Dr Fernande d/c 3/19: Increase Inderal  LA to bid   GERD (gastroesophageal reflux disease)    Glaucoma (increased eye pressure)    Heart murmur    HTN (hypertension)    IBS (irritable bowel syndrome)    LBP (low back pain)    Menopause    Osteoporosis    Ovarian cancer (HCC) 09/2008   Dr Cecil   Pancreatic cyst    Vitamin B12 deficiency    Vitamin D  deficiency    Past Surgical History:  Procedure Laterality Date   ABDOMINAL HYSTERECTOMY     APPENDECTOMY     BLADDER SURGERY  04/09/2020   tack and sling   CHOLECYSTECTOMY N/A 05/15/2017   Procedure: LAPAROSCOPIC CHOLECYSTECTOMY WITH INTRAOPERATIVE CHOLANGIOGRAM AND LYSIS OF ADHESIONS;  Surgeon: Gail Favorite, MD;  Location: [facility];  Service: General;  Laterality: N/A;  COLONOSCOPY  2018   Dr.Perry   ELBOW FRACTURE SURGERY     age 14- left elbow   HEMORRHOID SURGERY  2001   Ovarian Cancer Debulking  09/2008   POLYPECTOMY     Family History  Problem Relation Age of Onset   Alzheimer's disease Mother 77   Other Mother 26       TAH for excessive bleeding   Lung cancer Father        smoker; metastasis to stomach and other areas   Heart attack Maternal Uncle    Heart disease Maternal Uncle    Other Paternal Aunt        stomach issues   Heart Problems Paternal Uncle    Other Maternal Grandmother        stomach issues; +hysterectomy   Heart attack Maternal Grandfather    Heart disease Maternal Grandfather    Infertility Daughter    Stomach cancer Cousin        dx. mid-60s   Other Cousin        stomach issues   Leukemia Cousin 18   Stomach cancer Other    Cancer Cousin        unknown type   Other Cousin        stomach issues   Heart Problems Maternal Uncle    Diabetes Paternal Aunt    Heart Problems Paternal Uncle    Emphysema Paternal Uncle        work exposure   Breast cancer Cousin        dx. late 60s-early 70s   Colon cancer Neg Hx    Rectal  cancer Neg Hx    Esophageal cancer Neg Hx    Social History   Socioeconomic History   Marital status: Married    Spouse name: Arley   Number of children: 2   Years of education: BS   Highest education level: Bachelor's degree (e.g., BA, AB, BS)  Occupational History   Occupation: Dentist: HOMEMAKER  Tobacco Use   Smoking status: Never   Smokeless tobacco: Never  Vaping Use   Vaping status: Never Used  Substance and Sexual Activity   Alcohol use: No    Alcohol/week: 0.0 standard drinks of alcohol   Drug use: No   Sexual activity: Not on file  Other Topics Concern   Not on file  Social History Narrative   Lives with spouse/2025   Caffeine  use: cokes      Right handed    Social Drivers of Health   Financial Resource Strain: Low Risk  (03/12/2024)   Overall Financial Resource Strain (CARDIA)    Difficulty of Paying Living Expenses: Not hard at all  Food Insecurity: No Food Insecurity (03/12/2024)   Hunger Vital Sign    Worried About Running Out of Food in the Last Year: Never true    Ran Out of Food in the Last Year: Never true  Transportation Needs: No Transportation Needs (03/12/2024)   PRAPARE - Administrator, Civil Service (Medical): No    Lack of Transportation (Non-Medical): No  Physical Activity: Inactive (03/12/2024)   Exercise Vital Sign    Days of Exercise per Week: 0 days    Minutes of Exercise per Session: Not on file  Stress: Stress Concern Present (03/12/2024)   [employer] of Occupational Health - Occupational Stress Questionnaire    Feeling of Stress: To some extent  Social Connections: Socially Integrated (03/12/2024)   Social Connection and  Isolation Panel    Frequency of Communication with Friends and Family: More than three times a week    Frequency of Social Gatherings with Friends and Family: More than three times a week    Attends Religious Services: More than 4 times per year    Active Member of [employer] or Organizations:  Yes    Attends Engineer, structural: More than 4 times per year    Marital Status: Married    Tobacco Counseling Counseling given: Not Answered    Clinical Intake:  Pre-visit preparation completed: Yes  Pain : No/denies pain     BMI - recorded: 33.65 Nutritional Status: BMI > 30  Obese Diabetes: Yes CBG done?: Yes (119 per pt) CBG resulted in Enter/ Edit results?: No Did pt. bring in CBG monitor from home?: No  Lab Results  Component Value Date   HGBA1C 6.3 09/28/2023   HGBA1C 5.5 07/03/2023   HGBA1C 5.9 02/23/2023     How often do you need to have someone help you when you read instructions, pamphlets, or other written materials from your doctor or pharmacy?: 1 - Never  Interpreter Needed?: No  Information entered by :: [place] Oborn, RMA   Activities of Daily Living     03/12/2024   10:55 PM  In your present state of health, do you have any difficulty performing the following activities:  Hearing? 0  Vision? 0  Difficulty concentrating or making decisions? 0  Walking or climbing stairs? 0  Dressing or bathing? 0  Doing errands, shopping? 0  Preparing Food and eating ? N  Using the Toilet? N  In the past six months, have you accidently leaked urine? Y  Do you have problems with loss of bowel control? N  Managing your Medications? N  Managing your Finances? N  Housekeeping or managing your Housekeeping? N    Patient Care Team: [name], Karlynn GAILS, MD as PCP - General (Internal Medicine) Patrcia Sharper, MD (Ophthalmology) Marlee Bernardino NOVAK, MD as Attending Physician (Nephrology) Abran Norleen SAILOR, MD as Consulting Physician (Gastroenterology) Devonna Sieving, MD as Attending Physician (Gynecology) Jenel Carlin POUR, MD (Inactive) as Consulting Physician (Neurology) Alvia Dorothyann LABOR, MD as Referring Physician (Gynecology)  I have updated your Care Teams any recent Medical Services you may have received from other providers in the past  year.     Assessment:   This is a routine wellness examination for [name].  Hearing/Vision screen Hearing Screening - Comments:: Denies hearing difficulties   Vision Screening - Comments:: Wears eyeglasses Dr. Patrcia   Goals Addressed             This Visit's Progress    Patient Stated   On track    Wants health to improve.       Depression Screen     03/13/2024   10:58 AM 01/08/2024   10:53 AM 09/28/2023    1:39 PM 06/28/2023    1:29 PM 12/27/2022    2:54 PM 12/27/2022    2:00 PM 11/15/2022    3:05 PM  PHQ 2/9 Scores  PHQ - 2 Score 0 0 0 0 0 0 0  PHQ- 9 Score 0 2         Fall Risk     03/12/2024   10:55 PM 01/08/2024   10:53 AM 09/28/2023    1:39 PM 06/28/2023    1:29 PM 12/28/2022    1:41 PM  Fall Risk   Falls in the past year? 0 0 0  0 0  Number falls in past yr:  0 0 0 0  Injury with Fall?  0 0 0 0  Risk for fall due to :  No Fall Risks No Fall Risks No Fall Risks No Fall Risks  Follow up Falls evaluation completed;Falls prevention discussed Falls evaluation completed Falls evaluation completed Falls evaluation completed     MEDICARE RISK AT HOME:  Medicare Risk at Home Any stairs in or around the home?: (Patient-Rptd) Yes If so, are there any without handrails?: (Patient-Rptd) No Home free of loose throw rugs in walkways, pet beds, electrical cords, etc?: (Patient-Rptd) Yes Adequate lighting in your home to reduce risk of falls?: (Patient-Rptd) Yes Life alert?: (Patient-Rptd) No Use of a cane, walker or w/c?: (Patient-Rptd) No Grab bars in the bathroom?: (Patient-Rptd) No Shower chair or bench in shower?: (Patient-Rptd) No Elevated toilet seat or a handicapped toilet?: (Patient-Rptd) No  TIMED UP AND GO:  Was the test performed?  No  Cognitive Function: Declined/Normal: No cognitive concerns noted by patient or family. Patient alert, oriented, able to answer questions appropriately and recall recent events. No signs of memory loss or confusion.         12/27/2022    1:59 PM  6CIT Screen  What Year? 0 points  What month? 0 points  What time? 0 points  Count back from 20 0 points  Months in reverse 0 points  Repeat phrase 4 points  Total Score 4 points    Immunizations Immunization History  Administered Date(s) Administered   Fluad Trivalent(High Dose 65+) 06/28/2023   Influenza Whole 06/16/2009   Influenza, High Dose Seasonal PF 08/07/2013, 05/18/2016, 07/01/2020   Influenza, Seasonal, Injecte, Preservative Fre 08/07/2012   Influenza,inj,Quad PF,6+ Mos 05/27/2014, 08/02/2017, 07/16/2018, 07/24/2019   PFIZER(Purple Top)SARS-COV-2 Vaccination 10/11/2019, 11/05/2019, 06/02/2020   Pneumococcal Conjugate-13 01/17/2018   Pneumococcal Polysaccharide-23 11/16/2015   Td 02/05/2013    Screening Tests Health Maintenance  Topic Date Due   FOOT EXAM  Never done   Diabetic kidney evaluation - Urine ACR  Never done   Hepatitis C Screening  Never done   DTaP/Tdap/Td (2 - Tdap) 02/06/2023   COVID-19 Vaccine (4 - 2024-25 season) 05/07/2023   OPHTHALMOLOGY EXAM  12/19/2023   HEMOGLOBIN A1C  03/27/2024   Diabetic kidney evaluation - eGFR measurement  09/27/2024   Medicare Annual Wellness (AWV)  03/13/2025   Colonoscopy  02/26/2027   Pneumococcal Vaccine: 50+ Years  Completed   DEXA SCAN  Completed   Hepatitis B Vaccines  Aged Out   HPV VACCINES  Aged Out   Meningococcal B Vaccine  Aged Out   INFLUENZA VACCINE  Discontinued   Zoster Vaccines- Shingrix  Discontinued    Health Maintenance  Health Maintenance Due  Topic Date Due   FOOT EXAM  Never done   Diabetic kidney evaluation - Urine ACR  Never done   Hepatitis C Screening  Never done   DTaP/Tdap/Td (2 - Tdap) 02/06/2023   COVID-19 Vaccine (4 - 2024-25 season) 05/07/2023   OPHTHALMOLOGY EXAM  12/19/2023   Health Maintenance Items Addressed: Mammogram scheduled, DEXA ordered, Diabetic Foot Exam recommended, Labs Ordered: patient due for an Hep C, UACR, See Nurse Notes at the  end of this note  Additional Screening:  Vision Screening: Recommended annual ophthalmology exams for early detection of glaucoma and other disorders of the eye. Would you like a referral to an eye doctor? No    Dental Screening: Recommended annual dental exams for proper oral hygiene  Community Resource Referral / Chronic Care Management: CRR required this visit?  No   CCM required this visit?  No   Plan:    I have personally reviewed and noted the following in the patient's chart:   Medical and social history Use of alcohol, tobacco or illicit drugs  Current medications and supplements including opioid prescriptions. Patient is not currently taking opioid prescriptions. Functional ability and status Nutritional status Physical activity Advanced directives List of other physicians Hospitalizations, surgeries, and ER visits in previous 12 months Vitals Screenings to include cognitive, depression, and falls Referrals and appointments  In addition, I have reviewed and discussed with patient certain preventive protocols, quality metrics, and best practice recommendations. A written personalized care plan for preventive services as well as general preventive health recommendations were provided to patient.   Angeleena Dueitt L Greysen Devino, CMA   03/13/2024   After Visit Summary: (MyChart) Due to this being a telephonic visit, the after visit summary with patients personalized plan was offered to patient via MyChart   Notes: Patient is due a Tdap.  Patient is due for a DEXA and order has been placed today.  She is also due for a foot exam, a Hep C screening and a UACR, which all can be done during her next office visit.  Patient stated that she has had her diabetic eye exam for this year.  I have sent a request for eye exam out today.  Medical screening examination/treatment/procedure(s) were performed by non-physician practitioner and as supervising physician I was immediately available for  consultation/collaboration.  I agree with above. Karlynn Noel, MD

## 2024-03-21 ENCOUNTER — Other Ambulatory Visit: Payer: Self-pay | Admitting: Internal Medicine

## 2024-04-01 ENCOUNTER — Other Ambulatory Visit

## 2024-04-02 ENCOUNTER — Ambulatory Visit
Admission: RE | Admit: 2024-04-02 | Discharge: 2024-04-02 | Disposition: A | Discharge status: Home / Self Care | Source: Ambulatory Visit | Attending: Internal Medicine | Admitting: Internal Medicine

## 2024-04-02 ENCOUNTER — Other Ambulatory Visit

## 2024-04-02 DIAGNOSIS — Z1231 Encounter for screening mammogram for malignant neoplasm of breast: Secondary | ICD-10-CM

## 2024-04-02 DIAGNOSIS — E785 Hyperlipidemia, unspecified: Secondary | ICD-10-CM | POA: Diagnosis not present

## 2024-04-02 DIAGNOSIS — E669 Obesity, unspecified: Secondary | ICD-10-CM | POA: Diagnosis not present

## 2024-04-02 DIAGNOSIS — E118 Type 2 diabetes mellitus with unspecified complications: Secondary | ICD-10-CM | POA: Diagnosis not present

## 2024-04-02 DIAGNOSIS — E1169 Type 2 diabetes mellitus with other specified complication: Secondary | ICD-10-CM | POA: Diagnosis not present

## 2024-04-03 ENCOUNTER — Ambulatory Visit: Payer: Self-pay | Admitting: Endocrinology

## 2024-04-03 LAB — BASIC METABOLIC PANEL WITH GFR
BUN/Creatinine Ratio: 19 (calc) (ref 6–22)
BUN: 22 mg/dL (ref 7–25)
CO2: 24 mmol/L (ref 20–32)
Calcium: 9.9 mg/dL (ref 8.6–10.4)
Chloride: 105 mmol/L (ref 98–110)
Creat: 1.17 mg/dL — ABNORMAL HIGH (ref 0.60–1.00)
Glucose, Bld: 119 mg/dL (ref 65–139)
Potassium: 4.1 mmol/L (ref 3.5–5.3)
Sodium: 140 mmol/L (ref 135–146)
eGFR: 48 mL/min/1.73m2 — ABNORMAL LOW (ref >=60)

## 2024-04-03 LAB — MICROALBUMIN / CREATININE URINE RATIO
Creatinine, Urine: 184 mg/dL (ref 20–275)
Microalb Creat Ratio: 53 mg/g{creat} — ABNORMAL HIGH (ref <30)
Microalb, Ur: 9.8 mg/dL

## 2024-04-03 LAB — HEMOGLOBIN A1C
Hgb A1c MFr Bld: 6.2 % — ABNORMAL HIGH (ref <5.7)
Mean Plasma Glucose: 131 mg/dL
eAG (mmol/L): 7.3 mmol/L

## 2024-04-03 LAB — LIPID PANEL
Cholesterol: 177 mg/dL (ref <200)
HDL: 53 mg/dL (ref >=50)
LDL Cholesterol (Calc): 98 mg/dL
Non-HDL Cholesterol (Calc): 124 mg/dL (ref <130)
Total CHOL/HDL Ratio: 3.3 (calc) (ref <5.0)
Triglycerides: 159 mg/dL — ABNORMAL HIGH (ref <150)

## 2024-04-04 ENCOUNTER — Ambulatory Visit: Admitting: Endocrinology

## 2024-04-04 ENCOUNTER — Encounter: Payer: Self-pay | Admitting: Endocrinology

## 2024-04-04 ENCOUNTER — Other Ambulatory Visit: Payer: Self-pay | Admitting: Endocrinology

## 2024-04-04 VITALS — BP 124/72 | HR 63 | Resp 20 | Ht 61.5 in | Wt 175.8 lb

## 2024-04-04 DIAGNOSIS — E118 Type 2 diabetes mellitus with unspecified complications: Secondary | ICD-10-CM

## 2024-04-04 DIAGNOSIS — E785 Hyperlipidemia, unspecified: Secondary | ICD-10-CM

## 2024-04-04 DIAGNOSIS — Z7984 Long term (current) use of oral hypoglycemic drugs: Secondary | ICD-10-CM

## 2024-04-04 MED ORDER — EMPAGLIFLOZIN 10 MG PO TABS: 10.0000 mg | ORAL_TABLET | Freq: Every day | ORAL | 3 refills | Status: AC

## 2024-04-04 MED ORDER — ONETOUCH VERIO VI STRP: 1.0000 | ORAL_STRIP | Freq: Every day | 3 refills | Status: DC

## 2024-04-04 MED ORDER — ONETOUCH VERIO VI STRP: ORAL_STRIP | 3 refills | Status: DC

## 2024-04-04 MED ORDER — ROSUVASTATIN CALCIUM 5 MG PO TABS: 5.0000 mg | ORAL_TABLET | Freq: Every day | ORAL | 3 refills | Status: DC

## 2024-04-04 NOTE — Progress Notes (Signed)
 From  Outpatient Endocrinology Note [place] Zebulun Deman, MD  04/04/24  Patient's Name: [name]    DOB: 03-19-1946    MRN: [id number]                                                    REASON OF VISIT: Follow up of type 2 diabetes mellitus  PCP: [name], Karlynn GAILS, MD  HISTORY OF PRESENT ILLNESS:   [name] is a 78 y.o. old female with past medical history listed below, is here for follow up for type 2 diabetes mellitus.   Pertinent Diabetes History: Patient has type 2 diabetes mellitus, mainly based on fasting blood sugar more than 130 in the past.  She initially had prediabetes with hemoglobin A1c in the range of 5.7 to 6.4%.  She had seen dietitian in the past.  Chronic Diabetes Complications : Retinopathy: yes. Last ophthalmology exam was done on annually, following with ophthalmology regularly.  Nephropathy: CKD IIIa, following with nephrology. Peripheral neuropathy: yes Coronary artery disease: no Stroke: no  Relevant comorbidities and cardiovascular risk factors: Obesity: yes Body mass index is 32.68 kg/m.  Hypertension: Yes  Hyperlipidemia : Yes, on statin   Current / Home Diabetic regimen includes: Jardiance  10 mg daily.  Started in November 2020.  Prior diabetic medications: She was intolerant to metformin .  Glycemic data:    She has One Touch Verio Flex glucometer.  Glucometer data download from July 17 to April 04, 2024, average blood sugar 110.she has been checking blood sugar mostly in the morning and fasting and in the late morning and sometime in the afternoon.  Mostly blood sugar in the range of 80-130.  1 episode of blood sugar 64 in the morning on July 26.  No other hypoglycemia.  No significant hypoglycemia.  Hypoglycemia: Patient has no hypoglycemic episodes. Patient has hypoglycemia awareness.  Factors modifying glucose control: 1.  Diabetic diet assessment: 2-3 meals a day.  2.  Staying active or exercising: No formal exercise.  3.   Medication compliance: compliant all of the time.  Interval history  Glucometer data as reviewed above.  Mostly acceptable blood sugar.  She has been taking Jardiance .  She complains for rare dizziness, especially after eating.  She had tried holding Jardiance  in the past, did not resolve dizziness.  No reported hypoglycemia however ?  Dumping syndrome, ? reactive hypoglycemia, she has irritable bowel syndrome and closely following with GI.  Discussed about mixed meal diet and protein rich diet and avoiding pure carbohydrate meals.  She reports occasional gets lower extremity edema especially in the evening, she takes Lasix  as needed.  No pedal edema on physical exam today.  She has mild numbness and tingling of the feet.  No other complaints today.  Patient is accompanied by her husband in the clinic today.  REVIEW OF SYSTEMS As per history of present illness.   PAST MEDICAL HISTORY: Past Medical History:  Diagnosis Date   Adenomatous colon polyp 04/08/2011   Anemia    Anxiety    Cataract    Diabetes mellitus without complication (HCC)    pre-diabetic per pt   Essential hypertension 08/10/2007   Chronic  On Catapress (per Dr Cary) - increase to bid - d/c Hydralazine  prn per Dr Fernande d/c 3/19: Increase Inderal  LA to bid   GERD (gastroesophageal reflux disease)  Glaucoma (increased eye pressure)    Heart murmur    HTN (hypertension)    IBS (irritable bowel syndrome)    LBP (low back pain)    Menopause    Osteoporosis    Ovarian cancer (HCC) 09/2008   Dr Cecil   Pancreatic cyst    Vitamin B12 deficiency    Vitamin D  deficiency     PAST SURGICAL HISTORY: Past Surgical History:  Procedure Laterality Date   ABDOMINAL HYSTERECTOMY     APPENDECTOMY     BLADDER SURGERY  04/09/2020   tack and sling   CHOLECYSTECTOMY N/A 05/15/2017   Procedure: LAPAROSCOPIC CHOLECYSTECTOMY WITH INTRAOPERATIVE CHOLANGIOGRAM AND LYSIS OF ADHESIONS;  Surgeon: Gail Favorite, MD;  Location: [facility]  [facility];  Service: General;  Laterality: N/A;   COLONOSCOPY  2018   Dr.Perry   ELBOW FRACTURE SURGERY     age 60- left elbow   HEMORRHOID SURGERY  2001   Ovarian Cancer Debulking  09/2008   POLYPECTOMY      ALLERGIES: Allergies  Allergen Reactions   Boniva [Ibandronate Sodium]     cramp   Calcium  Channel Blockers     Upset stomach   Chlorthalidone      Diarrhea per pt   Compazine     Daughter reacts to compazine/pt does not want to take   Hyoscyamine      Dry mouth   Lyrica  [Pregabalin ]     Dizzy    Risedronate Sodium Other (See Comments)    Upset stomach   Tape Rash    redness    FAMILY HISTORY:  Family History  Problem Relation Age of Onset   Alzheimer's disease Mother 11   Other Mother 71       TAH for excessive bleeding   Lung cancer Father        smoker; metastasis to stomach and other areas   Heart attack Maternal Uncle    Heart disease Maternal Uncle    Other Paternal Aunt        stomach issues   Heart Problems Paternal Uncle    Other Maternal Grandmother        stomach issues; +hysterectomy   Heart attack Maternal Grandfather    Heart disease Maternal Grandfather    Infertility Daughter    Stomach cancer Cousin        dx. mid-60s   Other Cousin        stomach issues   Leukemia Cousin 18   Stomach cancer Other    Cancer Cousin        unknown type   Other Cousin        stomach issues   Heart Problems Maternal Uncle    Diabetes Paternal Aunt    Heart Problems Paternal Uncle    Emphysema Paternal Uncle        work exposure   Breast cancer Cousin        dx. late 60s-early 70s   Colon cancer Neg Hx    Rectal cancer Neg Hx    Esophageal cancer Neg Hx     SOCIAL HISTORY: Social History   Socioeconomic History   Marital status: Married    Spouse name: Arley   Number of children: 2   Years of education: BS   Highest education level: Bachelor's degree (e.g., BA, AB, BS)  Occupational History   Occupation: Dentist: HOMEMAKER   Tobacco Use   Smoking status: Never   Smokeless tobacco: Never  Vaping Use   Vaping  status: Never Used  Substance and Sexual Activity   Alcohol use: No    Alcohol/week: 0.0 standard drinks of alcohol   Drug use: No   Sexual activity: Not on file  Other Topics Concern   Not on file  Social History Narrative   Lives with spouse/2025   Caffeine  use: cokes      Right handed    Social Drivers of Health   Financial Resource Strain: Low Risk  (03/12/2024)   Overall Financial Resource Strain (CARDIA)    Difficulty of Paying Living Expenses: Not hard at all  Food Insecurity: No Food Insecurity (03/12/2024)   Hunger Vital Sign    Worried About Running Out of Food in the Last Year: Never true    Ran Out of Food in the Last Year: Never true  Transportation Needs: No Transportation Needs (03/12/2024)   PRAPARE - Administrator, Civil Service (Medical): No    Lack of Transportation (Non-Medical): No  Physical Activity: Inactive (03/12/2024)   Exercise Vital Sign    Days of Exercise per Week: 0 days    Minutes of Exercise per Session: Not on file  Stress: Stress Concern Present (03/12/2024)   [employer] of Occupational Health - Occupational Stress Questionnaire    Feeling of Stress: To some extent  Social Connections: Socially Integrated (03/12/2024)   Social Connection and Isolation Panel    Frequency of Communication with Friends and Family: More than three times a week    Frequency of Social Gatherings with Friends and Family: More than three times a week    Attends Religious Services: More than 4 times per year    Active Member of [employer] or Organizations: Yes    Attends Engineer, structural: More than 4 times per year    Marital Status: Married    MEDICATIONS:  Current Outpatient Medications  Medication Sig Dispense Refill   amLODipine  (NORVASC ) 5 MG tablet TAKE 1 TABLET (5 MG TOTAL) BY MOUTH DAILY. 90 tablet 2   aspirin  81 MG EC tablet Take 81 mg by mouth  daily.     bimatoprost  (LUMIGAN ) 0.01 % SOLN Place 1 drop into both eyes at bedtime.     Blood Glucose Monitoring Suppl (ONETOUCH VERIO FLEX SYSTEM) w/Device KIT 1 each by Does not apply route in the morning, at noon, and at bedtime.      butalbital -acetaminophen -caffeine  (FIORICET) 50-325-40 MG tablet Take 1 tablet by mouth every 6 (six) hours as needed for headache. 60 tablet 0   [facility] DIGESTIVE PROBIOTIC 250 MG capsule TAKE 1 CAPSULE BY MOUTH 2 TIMES DAILY. 50 capsule 2   Cyanocobalamin  (NASCOBAL ) 500 MCG/0.1ML SOLN USE 1 SPRAY IN 1 NOSTRIL  ONCE A WEEK 12 each 3   DENTA 5000 PLUS 1.1 % CREA dental cream Take by mouth as directed.     diphenoxylate -atropine  (LOMOTIL ) 2.5-0.025 MG tablet Take 1 tablet by mouth 4 (four) times daily as needed for diarrhea or loose stools. 60 tablet 5   fluticasone  (FLONASE ) 50 MCG/ACT nasal spray Place 1 spray into both nostrils daily. 15.8 mL 0   furosemide  (LASIX ) 20 MG tablet TAKE 0.5-1 TABLETS (10-20 MG TOTAL) BY MOUTH DAILY AS NEEDED FOR EDEMA (FOR LEG SWELLING). 90 tablet 1   KLOR-CON  M20 20 MEQ tablet TAKE 1 TABLET BY MOUTH EVERY DAY 90 tablet 3   Lancets (ONETOUCH DELICA PLUS LANCET30G) MISC Check blood sugar alternating fasting and 2 hours after meals 200 each 2   LORazepam  (ATIVAN ) 0.5 MG  tablet Take 1 tablet (0.5 mg total) by mouth 2 (two) times daily as needed for anxiety. 60 tablet 2   methocarbamol  (ROBAXIN ) 500 MG tablet Take 1 tablet (500 mg total) by mouth every 8 (eight) hours as needed for muscle spasms. 90 tablet 1   oxybutynin  (DITROPAN ) 5 MG tablet TAKE 1 TABLET BY MOUTH THREE TIMES A DAY 270 tablet 1   Peppermint Oil (IBGARD) 90 MG CPCR Take 2 capsules by mouth 2 (two) times daily.     polycarbophil (FIBERCON) 625 MG tablet Take 2 tablets (1,250 mg total) by mouth 2 (two) times daily.     PROLIA  60 MG/ML SOSY injection INJECT 1 SYRINGE UNDER THE SKIN ONCE EVERY 6 MONTHS 1 mL 0   propranolol  ER (INDERAL  LA) 60 MG 24 hr capsule TAKE 1 CAPSULE BY  MOUTH 2 TIMES DAILY. 180 capsule 3   RABEprazole  (ACIPHEX ) 20 MG tablet Take 1 tablet (20 mg total) by mouth daily. 90 tablet 3   timolol  (TIMOPTIC ) 0.5 % ophthalmic solution Place 1 drop into both eyes 2 (two) times daily.     triamcinolone  cream (KENALOG) 0.1 % APPLY TO AFFECTED AREA TWICE A DAY AS NEEDED     VITAMIN D3 1.25 MG (50000 UT) capsule TAKE ONE CAPSULE BY MOUTH EVERY 30 DAYS 9 capsule 1   empagliflozin  (JARDIANCE ) 10 MG TABS tablet Take 1 tablet (10 mg total) by mouth daily with breakfast. 90 tablet 3   glucose blood (ONETOUCH VERIO) test strip 1 each by Other route daily. 100 strip 3   ONETOUCH VERIO test strip CHECK BLOOD SUGAR ALTERNATING FASTING AND 2 HOURS AFTER MEALS 200 strip 3   rosuvastatin  (CRESTOR ) 5 MG tablet Take 1 tablet (5 mg total) by mouth daily. 90 tablet 3   No current facility-administered medications for this visit.    PHYSICAL EXAM: Vitals:   04/04/24 1054  BP: 124/72  Pulse: 63  Resp: 20  SpO2: 97%  Weight: 175 lb 12.8 oz (79.7 kg)  Height: 5' 1.5 (1.562 m)    Body mass index is 32.68 kg/m.  Wt Readings from Last 3 Encounters:  04/04/24 175 lb 12.8 oz (79.7 kg)  03/13/24 181 lb (82.1 kg)  02/26/24 181 lb (82.1 kg)    General: Well developed, well nourished female in no apparent distress.  HEENT: AT/[place], no external lesions.  Eyes: Conjunctiva clear and no icterus. Neck: Neck supple  Lungs: Respirations not labored Neurologic: Alert, oriented, normal speech Extremities / Skin: Dry.  Psychiatric: Does not appear depressed.  Diabetic Foot Exam - Simple   Simple Foot Form Diabetic Foot exam was performed with the following findings: Yes 04/04/2024 11:16 AM  Visual Inspection No deformities, no ulcerations, no other skin breakdown bilaterally: Yes Sensation Testing Intact to touch and monofilament testing bilaterally: Yes Pulse Check Posterior Tibialis and Dorsalis pulse intact bilaterally: Yes Comments     LABS Reviewed Lab Results   Component Value Date   HGBA1C 6.2 (H) 04/02/2024   HGBA1C 6.3 09/28/2023   HGBA1C 5.5 07/03/2023   Lab Results  Component Value Date   FRUCTOSAMINE 257 09/23/2019   Lab Results  Component Value Date   CHOL 177 04/02/2024   HDL 53 04/02/2024   LDLCALC 98 04/02/2024   LDLDIRECT 122.0 05/24/2022   TRIG 159 (H) 04/02/2024   CHOLHDL 3.3 04/02/2024   Lab Results  Component Value Date   MICRALBCREAT 53 (H) 04/02/2024   Lab Results  Component Value Date   CREATININE 1.17 (H) 04/02/2024  Lab Results  Component Value Date   GFR 42.30 (L) 09/28/2023    ASSESSMENT / PLAN  1. Controlled type 2 diabetes mellitus with complication, without long-term current use of insulin (HCC)   2. Dyslipidemia     Diabetes Mellitus type 2, complicated by CKD, diabetic retinopathy and peripheral neuropathy. - Diabetic status / severity: Controlled.  Lab Results  Component Value Date   HGBA1C 6.2 (H) 04/02/2024    - Hemoglobin A1c goal : <6.5%  - Medications: No change.  I) continue Jardiance  10 mg daily.    Asked check blood sugar when symptomatic otherwise okay to check blood sugar in the morning fasting daily and occasionally at bedtime.  - Home glucose testing: In the morning fasting and occasionally at bedtime and as needed.  - Discussed/ Gave Hypoglycemia treatment plan.  # Consult : not required at this time.   # Annual urine for microalbuminuria/ creatinine ratio,+ microalbuminuria currently.  CKD 3 following with nephrology.  Currently on SGLT2 inhibitor. Last  Lab Results  Component Value Date   MICRALBCREAT 53 (H) 04/02/2024    # Foot check nightly / neuropathy.  # Annual dilated diabetic eye exams.  She has diabetic retinopathy, following regularly with ophthalmology.  - Diet: Eat reasonable portion sizes to promote a healthy weight  2. Blood pressure  -  BP Readings from Last 1 Encounters:  04/04/24 124/72    - Control is  in target.  - No change in  current plans.  3. Lipid status / Hyperlipidemia - Last  Lab Results  Component Value Date   LDLCALC 98 04/02/2024   -Currently not taking rosuvastatin .  Managed by primary care provider. -Recommend to be compliant with rosuvastatin .  Resumed rosuvastatin  5 mg daily.  Diagnoses and all orders for this visit:  Controlled type 2 diabetes mellitus with complication, without long-term current use of insulin (HCC) -     empagliflozin  (JARDIANCE ) 10 MG TABS tablet; Take 1 tablet (10 mg total) by mouth daily with breakfast. -     rosuvastatin  (CRESTOR ) 5 MG tablet; Take 1 tablet (5 mg total) by mouth daily. -     Discontinue: glucose blood (ONETOUCH VERIO) test strip; Check blood sugar alternating fasting and 2 hours after meals -     glucose blood (ONETOUCH VERIO) test strip; 1 each by Other route daily.  Dyslipidemia -     rosuvastatin  (CRESTOR ) 5 MG tablet; Take 1 tablet (5 mg total) by mouth daily.    DISPOSITION Follow up in clinic in 4 months suggested.     All questions answered and patient verbalized understanding of the plan.  [place] Davion Flannery, MD [facility] Endocrinology [facility] [address] [place], [address] [place], [place] [address] Phone # [phone]  At least part of this note was generated using voice recognition software. Inadvertent word errors may have occurred, which were not recognized during the proofreading process.

## 2024-04-09 ENCOUNTER — Encounter: Payer: Self-pay | Admitting: Internal Medicine

## 2024-04-09 ENCOUNTER — Ambulatory Visit: Admitting: Internal Medicine

## 2024-04-09 VITALS — BP 140/75 | HR 69 | Temp 98.4°F | Ht 61.5 in | Wt 175.0 lb

## 2024-04-09 DIAGNOSIS — F4329 Adjustment disorder with other symptoms: Secondary | ICD-10-CM

## 2024-04-09 DIAGNOSIS — R3915 Urgency of urination: Secondary | ICD-10-CM | POA: Diagnosis not present

## 2024-04-09 DIAGNOSIS — R350 Frequency of micturition: Secondary | ICD-10-CM | POA: Diagnosis not present

## 2024-04-09 DIAGNOSIS — N3941 Urge incontinence: Secondary | ICD-10-CM

## 2024-04-09 DIAGNOSIS — E538 Deficiency of other specified B group vitamins: Secondary | ICD-10-CM

## 2024-04-09 MED ORDER — OXYBUTYNIN CHLORIDE 5 MG PO TABS: 5.0000 mg | ORAL_TABLET | Freq: Three times a day (TID) | ORAL | 3 refills | Status: DC | PRN

## 2024-04-09 MED ORDER — LORAZEPAM 0.5 MG PO TABS: 0.5000 mg | ORAL_TABLET | Freq: Two times a day (BID) | ORAL | 2 refills | Status: AC | PRN

## 2024-04-09 MED ORDER — VITAMIN D (ERGOCALCIFEROL) 1.25 MG (50000 UNIT) PO CAPS
50000.0000 [IU] | ORAL_CAPSULE | ORAL | 3 refills | Status: AC
Start: 2024-04-09 — End: ?

## 2024-04-09 NOTE — Assessment & Plan Note (Signed)
 OAB Try baking soda tablets for  bladder frequency Pt has Oxybutinin - not using

## 2024-04-09 NOTE — Assessment & Plan Note (Signed)
 Ditropan  prn  Potential benefits of a long term Ditropan   use as well as potential risks  and complications were explained to the patient and were aknowledged.

## 2024-04-09 NOTE — Progress Notes (Signed)
 Subjective:  Patient ID: [name], female    DOB: [birth year]  Age: 78 y.o. MRN: [id number]  CC: Medical Management of Chronic Issues (3 mnth f/u )   HPI [name] presents for anxiety, chronic GI sx's, diarrhea - better on IBguard C/o frequent urination - every 30 min C/o family stress   Outpatient Medications Prior to Visit  Medication Sig Dispense Refill   amLODipine  (NORVASC ) 5 MG tablet TAKE 1 TABLET (5 MG TOTAL) BY MOUTH DAILY. 90 tablet 2   aspirin  81 MG EC tablet Take 81 mg by mouth daily.     bimatoprost  (LUMIGAN ) 0.01 % SOLN Place 1 drop into both eyes at bedtime.     Blood Glucose Monitoring Suppl (ONETOUCH VERIO FLEX SYSTEM) w/Device KIT 1 each by Does not apply route in the morning, at noon, and at bedtime.      butalbital -acetaminophen -caffeine  (FIORICET) 50-325-40 MG tablet Take 1 tablet by mouth every 6 (six) hours as needed for headache. 60 tablet 0   [employer]S DIGESTIVE PROBIOTIC 250 MG capsule TAKE 1 CAPSULE BY MOUTH 2 TIMES DAILY. 50 capsule 2   Cyanocobalamin  (NASCOBAL ) 500 MCG/0.1ML SOLN USE 1 SPRAY IN 1 NOSTRIL  ONCE A WEEK 12 each 3   DENTA 5000 PLUS 1.1 % CREA dental cream Take by mouth as directed.     diphenoxylate -atropine  (LOMOTIL ) 2.5-0.025 MG tablet Take 1 tablet by mouth 4 (four) times daily as needed for diarrhea or loose stools. 60 tablet 5   empagliflozin  (JARDIANCE ) 10 MG TABS tablet Take 1 tablet (10 mg total) by mouth daily with breakfast. 90 tablet 3   fluticasone  (FLONASE ) 50 MCG/ACT nasal spray Place 1 spray into both nostrils daily. 15.8 mL 0   furosemide  (LASIX ) 20 MG tablet TAKE 0.5-1 TABLETS (10-20 MG TOTAL) BY MOUTH DAILY AS NEEDED FOR EDEMA (FOR LEG SWELLING). 90 tablet 1   glucose blood (ONETOUCH VERIO) test strip 1 each by Other route daily. 100 strip 3   KLOR-CON  M20 20 MEQ tablet TAKE 1 TABLET BY MOUTH EVERY DAY 90 tablet 3   Lancets (ONETOUCH DELICA PLUS LANCET30G) MISC Check blood sugar alternating fasting and 2 hours after  meals 200 each 2   methocarbamol  (ROBAXIN ) 500 MG tablet Take 1 tablet (500 mg total) by mouth every 8 (eight) hours as needed for muscle spasms. 90 tablet 1   ONETOUCH VERIO test strip CHECK BLOOD SUGAR ALTERNATING FASTING AND 2 HOURS AFTER MEALS 200 strip 3   Peppermint Oil (IBGARD) 90 MG CPCR Take 2 capsules by mouth 2 (two) times daily.     polycarbophil (FIBERCON) 625 MG tablet Take 2 tablets (1,250 mg total) by mouth 2 (two) times daily.     PROLIA  60 MG/ML SOSY injection INJECT 1 SYRINGE UNDER THE SKIN ONCE EVERY 6 MONTHS 1 mL 0   propranolol  ER (INDERAL  LA) 60 MG 24 hr capsule TAKE 1 CAPSULE BY MOUTH 2 TIMES DAILY. 180 capsule 3   RABEprazole  (ACIPHEX ) 20 MG tablet Take 1 tablet (20 mg total) by mouth daily. 90 tablet 3   rosuvastatin  (CRESTOR ) 5 MG tablet Take 1 tablet (5 mg total) by mouth daily. 90 tablet 3   timolol  (TIMOPTIC ) 0.5 % ophthalmic solution Place 1 drop into both eyes 2 (two) times daily.     triamcinolone  cream (KENALOG) 0.1 % APPLY TO AFFECTED AREA TWICE A DAY AS NEEDED     VITAMIN D3 1.25 MG (50000 UT) capsule TAKE ONE CAPSULE BY MOUTH EVERY 30 DAYS 9  capsule 1   LORazepam  (ATIVAN ) 0.5 MG tablet Take 1 tablet (0.5 mg total) by mouth 2 (two) times daily as needed for anxiety. 60 tablet 2   oxybutynin  (DITROPAN ) 5 MG tablet TAKE 1 TABLET BY MOUTH THREE TIMES A DAY 270 tablet 1   No facility-administered medications prior to visit.    ROS: Review of Systems  Constitutional:  Positive for fatigue. Negative for activity change, appetite change, chills and unexpected weight change.  HENT:  Negative for congestion, mouth sores and sinus pressure.   Eyes:  Negative for visual disturbance.  Respiratory:  Negative for cough and chest tightness.   Gastrointestinal:  Positive for diarrhea. Negative for abdominal pain and nausea.  Genitourinary:  Positive for frequency. Negative for difficulty urinating and vaginal pain.  Musculoskeletal:  Negative for back pain and gait  problem.  Skin:  Negative for pallor and rash.  Neurological:  Negative for dizziness, tremors, weakness, numbness and headaches.  Psychiatric/Behavioral:  Positive for decreased concentration and dysphoric mood. Negative for confusion, sleep disturbance and suicidal ideas. The patient is nervous/anxious.     Objective:  BP (!) 140/75   Pulse 69   Temp 98.4 F (36.9 C) (Oral)   Ht 5' 1.5 (1.562 m)   Wt 175 lb (79.4 kg)   SpO2 98%   BMI 32.53 kg/m   BP Readings from Last 3 Encounters:  04/09/24 (!) 140/75  04/04/24 124/72  02/26/24 103/64    Wt Readings from Last 3 Encounters:  04/09/24 175 lb (79.4 kg)  04/04/24 175 lb 12.8 oz (79.7 kg)  03/13/24 181 lb (82.1 kg)    Physical Exam Constitutional:      General: She is not in acute distress.    Appearance: She is well-developed. She is obese.  HENT:     Head: Normocephalic.     Right Ear: External ear normal.     Left Ear: External ear normal.     Nose: Nose normal.  Eyes:     General:        Right eye: No discharge.        Left eye: No discharge.     Conjunctiva/sclera: Conjunctivae normal.     Pupils: Pupils are equal, round, and reactive to light.  Neck:     Thyroid : No thyromegaly.     Vascular: No JVD.     Trachea: No tracheal deviation.  Cardiovascular:     Rate and Rhythm: Normal rate and regular rhythm.     Heart sounds: Normal heart sounds.  Pulmonary:     Effort: No respiratory distress.     Breath sounds: No stridor. No wheezing.  Abdominal:     General: Bowel sounds are normal. There is no distension.     Palpations: Abdomen is soft. There is no mass.     Tenderness: There is no abdominal tenderness. There is no guarding or rebound.  Musculoskeletal:        General: No tenderness.     Cervical back: Normal range of motion and neck supple. No rigidity.     Right lower leg: No edema.     Left lower leg: No edema.  Lymphadenopathy:     Cervical: No cervical adenopathy.  Skin:    Findings: No  erythema or rash.  Neurological:     Mental Status: She is oriented to person, place, and time.     Cranial Nerves: No cranial nerve deficit.     Motor: No abnormal muscle tone.  Coordination: Coordination normal.     Deep Tendon Reflexes: Reflexes normal.  Psychiatric:        Behavior: Behavior normal.        Thought Content: Thought content normal.        Judgment: Judgment normal.     Lab Results  Component Value Date   WBC 6.8 09/28/2023   HGB 13.4 09/28/2023   HCT 40.3 09/28/2023   PLT 241.0 09/28/2023   GLUCOSE 119 04/02/2024   CHOL 177 04/02/2024   TRIG 159 (H) 04/02/2024   HDL 53 04/02/2024   LDLDIRECT 122.0 05/24/2022   LDLCALC 98 04/02/2024   ALT 20 09/28/2023   AST 23 09/28/2023   NA 140 04/02/2024   K 4.1 04/02/2024   CL 105 04/02/2024   CREATININE 1.17 (H) 04/02/2024   BUN 22 04/02/2024   CO2 24 04/02/2024   TSH 2.10 09/28/2023   HGBA1C 6.2 (H) 04/02/2024   MICROALBUR 9.8 04/02/2024    No results found.  Assessment & Plan:   Problem List Items Addressed This Visit     B12 deficiency   On B12      Frequency of urination - Primary   OAB Try baking soda tablets for  bladder frequency Pt has Oxybutinin - not using      Stress and adjustment reaction   Restart Lorazepam  prn  Potential benefits of a short/long term benzodiazepines  use as well as potential risks  and complications were explained to the patient and were aknowledged.       Urge incontinence   Ditropan  prn  Potential benefits of a long term Ditropan   use as well as potential risks  and complications were explained to the patient and were aknowledged.       Relevant Medications   oxybutynin  (DITROPAN ) 5 MG tablet   Urinary urgency   Ditropan  prn  Potential benefits of a long term Ditropan   use as well as potential risks  and complications were explained to the patient and were aknowledged.         Meds ordered this encounter  Medications   LORazepam  (ATIVAN ) 0.5 MG  tablet    Sig: Take 1 tablet (0.5 mg total) by mouth 2 (two) times daily as needed for anxiety.    Dispense:  60 tablet    Refill:  2   oxybutynin  (DITROPAN ) 5 MG tablet    Sig: Take 1 tablet (5 mg total) by mouth every 8 (eight) hours as needed for bladder spasms.    Dispense:  90 tablet    Refill:  3   Vitamin D , Ergocalciferol , (DRISDOL ) 1.25 MG (50000 UNIT) CAPS capsule    Sig: Take 1 capsule (50,000 Units total) by mouth every 30 (thirty) days.    Dispense:  3 capsule    Refill:  3      Follow-up: Return in about 3 months (around 07/10/2024) for a follow-up visit.  Marolyn Noel, MD

## 2024-04-09 NOTE — Patient Instructions (Signed)
 Try baking soda tablets for your bladder frequency

## 2024-04-09 NOTE — Assessment & Plan Note (Signed)
 On B12

## 2024-04-09 NOTE — Assessment & Plan Note (Signed)
 Restart Lorazepam  prn  Potential benefits of a short/long term benzodiazepines  use as well as potential risks  and complications were explained to the patient and were aknowledged.

## 2024-05-16 ENCOUNTER — Ambulatory Visit: Admission: RE | Admit: 2024-05-16 | Source: Ambulatory Visit

## 2024-05-16 ENCOUNTER — Telehealth: Payer: Self-pay | Admitting: Radiology

## 2024-05-16 ENCOUNTER — Ambulatory Visit

## 2024-05-16 ENCOUNTER — Ambulatory Visit (INDEPENDENT_AMBULATORY_CARE_PROVIDER_SITE_OTHER): Admitting: Family Medicine

## 2024-05-16 ENCOUNTER — Encounter: Payer: Self-pay | Admitting: Family Medicine

## 2024-05-16 ENCOUNTER — Ambulatory Visit: Payer: Self-pay | Admitting: Family Medicine

## 2024-05-16 ENCOUNTER — Ambulatory Visit: Payer: Self-pay

## 2024-05-16 VITALS — BP 124/78 | HR 78 | Temp 97.8°F | Ht 61.5 in | Wt 172.8 lb

## 2024-05-16 DIAGNOSIS — E669 Obesity, unspecified: Secondary | ICD-10-CM

## 2024-05-16 DIAGNOSIS — Z7984 Long term (current) use of oral hypoglycemic drugs: Secondary | ICD-10-CM

## 2024-05-16 DIAGNOSIS — R300 Dysuria: Secondary | ICD-10-CM | POA: Diagnosis not present

## 2024-05-16 DIAGNOSIS — E1169 Type 2 diabetes mellitus with other specified complication: Secondary | ICD-10-CM

## 2024-05-16 DIAGNOSIS — R1032 Left lower quadrant pain: Secondary | ICD-10-CM

## 2024-05-16 LAB — COMPREHENSIVE METABOLIC PANEL WITH GFR
ALT: 21 U/L (ref 0–35)
AST: 25 U/L (ref 0–37)
Albumin: 4.8 g/dL (ref 3.5–5.2)
Alkaline Phosphatase: 49 U/L (ref 39–117)
BUN: 18 mg/dL (ref 6–23)
CO2: 25 meq/L (ref 19–32)
Calcium: 9.6 mg/dL (ref 8.4–10.5)
Chloride: 105 meq/L (ref 96–112)
Creatinine, Ser: 1.13 mg/dL (ref 0.40–1.20)
GFR: 46.63 mL/min — ABNORMAL LOW (ref >60.00)
Glucose, Bld: 109 mg/dL — ABNORMAL HIGH (ref 70–99)
Potassium: 3.7 meq/L (ref 3.5–5.1)
Sodium: 140 meq/L (ref 135–145)
Total Bilirubin: 1 mg/dL (ref 0.2–1.2)
Total Protein: 7.9 g/dL (ref 6.0–8.3)

## 2024-05-16 LAB — CBC WITH DIFFERENTIAL/PLATELET
Basophils Absolute: 0.1 K/uL (ref 0.0–0.1)
Basophils Relative: 0.7 % (ref 0.0–3.0)
Eosinophils Absolute: 0.1 K/uL (ref 0.0–0.7)
Eosinophils Relative: 1.3 % (ref 0.0–5.0)
HCT: 39.5 % (ref 36.0–46.0)
Hemoglobin: 13.2 g/dL (ref 12.0–15.0)
Lymphocytes Relative: 15 % (ref 12.0–46.0)
Lymphs Abs: 1.1 K/uL (ref 0.7–4.0)
MCHC: 33.4 g/dL (ref 30.0–36.0)
MCV: 86.3 fl (ref 78.0–100.0)
Monocytes Absolute: 0.5 K/uL (ref 0.1–1.0)
Monocytes Relative: 6.9 % (ref 3.0–12.0)
Neutro Abs: 5.5 K/uL (ref 1.4–7.7)
Neutrophils Relative %: 76.1 % (ref 43.0–77.0)
Platelets: 218 K/uL (ref 150.0–400.0)
RBC: 4.58 Mil/uL (ref 3.87–5.11)
RDW: 14.3 % (ref 11.5–15.5)
WBC: 7.3 K/uL (ref 4.0–10.5)

## 2024-05-16 LAB — POCT URINALYSIS DIP (CLINITEK)
Bilirubin, UA: NEGATIVE
Blood, UA: NEGATIVE
Glucose, UA: >=1000 mg/dL — AB
Ketones, POC UA: NEGATIVE mg/dL
Leukocytes, UA: NEGATIVE
Nitrite, UA: NEGATIVE
Spec Grav, UA: 1.01 (ref 1.010–1.025)
Urobilinogen, UA: 0.2 U/dL
pH, UA: 6 (ref 5.0–8.0)

## 2024-05-16 NOTE — Patient Instructions (Signed)
 I have ordered a stat CT for you.  Somebody reaching out to get you scheduled for this.  I am going to hold off on changing any medications until we have a better idea of what is going on.  Your urine looks okay in the office today, we will send for culture for confirmation.  We are checking labs today, will be in contact with any results that require further attention  If symptoms worsen today or before I get CT results, please go to the emergency department for further evaluation and workup.

## 2024-05-16 NOTE — Telephone Encounter (Signed)
 Copied from CRM [id number]. Topic: General - Other >> May 16, 2024  3:15 PM Lauren C wrote: Reason for CRM: Husband Arley on the line at the MRI location where they say they need prior auth. Ey#[id number]

## 2024-05-16 NOTE — Telephone Encounter (Signed)
 FYI Only or Action Required?: FYI only for provider.  Patient was last seen in primary care on 04/09/2024 by [name], Karlynn GAILS, MD.  Called Nurse Triage reporting Flank Pain.  Symptoms began yesterday.  Interventions attempted: OTC medications: Tylenol , Rest, hydration, or home remedies, and Ice/heat application.  Symptoms are: gradually worsening.  Triage Disposition: See Physician Within 24 Hours  Patient/caregiver understands and will follow disposition?: Yes       Copied from CRM #[id number]. Topic: Clinical - Red Word Triage >> May 16, 2024 10:32 AM Vena HERO wrote: Red Word that prompted transfer to Nurse Triage: fever and pain         Reason for Disposition  MODERATE pain (e.g., interferes with normal activities or awakens from sleep)  Answer Assessment - Initial Assessment Questions 1. LOCATION: Where does it hurt? (e.g., left, right)     Left flank 2. ONSET: When did the pain start?     Yesterday  3. SEVERITY: How bad is the pain? (e.g., Scale 1-10; mild, moderate, or severe)     Moderate  4. PATTERN: Does the pain come and go, or is it constant?      Constant  5. CAUSE: What do you think is causing the pain?     Unsure if it is a UTI or kidney stone  6. OTHER SYMPTOMS:  Do you have any other symptoms? (e.g., fever, abdomen pain, vomiting, leg weakness, burning with urination, blood in urine)     Fever, increased urinary frequency yesterday  Protocols used: Flank Pain-A-AH

## 2024-05-16 NOTE — Progress Notes (Signed)
 Acute Office Visit  Subjective:     Patient ID: [name], female    DOB: 03/08/1946, 78 y.o.   MRN: [id number]  Chief Complaint  Patient presents with   Acute Visit    HPI  25 female presents with left lower quadrant abdominal pain for the 3 days.  She is accompanied by her husband who is supplementing history. States that the pain is making her sometimes lightheaded and dizzy.  Reports 2 episodes of bowel movements that were loose today. Low-grade temp of 99.8. Has been using heating pad, Tylenol  with minimal relief. Reports urinary frequency, has also increased fluid intake. Reports blood pressure was pretty high last night at 180s over 80s. Blood pressure stabilized today.    ROS Per HPI      Objective:    BP 124/78 (BP Location: Left Arm, Patient Position: Sitting)   Pulse 78   Temp 97.8 F (36.6 C) (Temporal)   Ht 5' 1.5 (1.562 m)   Wt 172 lb 12.8 oz (78.4 kg)   SpO2 99%   BMI 32.12 kg/m    Physical Exam Vitals and nursing note reviewed.  Constitutional:      General: She is not in acute distress.    Comments: Appears uncomfortable  HENT:     Head: Normocephalic and atraumatic.     Right Ear: External ear normal.     Left Ear: External ear normal.  Eyes:     Extraocular Movements: Extraocular movements intact.  Cardiovascular:     Rate and Rhythm: Normal rate and regular rhythm.     Pulses: Normal pulses.     Heart sounds: Normal heart sounds.  Pulmonary:     Effort: Pulmonary effort is normal. No respiratory distress.     Breath sounds: Normal breath sounds. No wheezing, rhonchi or rales.  Abdominal:     General: There is no distension.     Palpations: Abdomen is soft. There is no mass.     Tenderness: There is abdominal tenderness (Left lower quadrant). There is guarding. There is no rebound.     Hernia: No hernia is present.  Musculoskeletal:        General: Normal range of motion.     Cervical back: Normal range of motion.      Right lower leg: No edema.     Left lower leg: No edema.  Lymphadenopathy:     Cervical: No cervical adenopathy.  Neurological:     General: No focal deficit present.     Mental Status: She is alert and oriented to person, place, and time.  Psychiatric:        Mood and Affect: Mood normal.        Thought Content: Thought content normal.     Results for orders placed or performed in visit on 05/16/24  POCT URINALYSIS DIP (CLINITEK)  Result Value Ref Range   Color, UA yellow yellow   Clarity, UA clear clear   Glucose, UA >=1,000 (A) negative mg/dL   Bilirubin, UA negative negative   Ketones, POC UA negative negative mg/dL   Spec Grav, UA 8.989 8.989 - 1.025   Blood, UA negative negative   pH, UA 6.0 5.0 - 8.0   POC PROTEIN,UA trace negative, trace   Urobilinogen, UA 0.2 0.2 or 1.0 E.U./dL   Nitrite, UA Negative Negative   Leukocytes, UA Negative Negative        Assessment & Plan:   LLQ abdominal pain -  CBC with Differential/Platelet -     Comprehensive metabolic panel with GFR  Dysuria -     POCT URINALYSIS DIP (CLINITEK) -     Urine Culture; Future  Left lower quadrant abdominal pain -     CT ABDOMEN PELVIS W CONTRAST; Future  Type 2 diabetes mellitus with obesity (HCC)  Dysuria today likely related to Jardiance   Concern for acute intra-abdominal/reproductive process given pain out of proportion.  Does have history of ovarian cancer, family history of stomach cancer  Need for stat abdominal/pelvic CT to rule out acute emergent processes     Orders Placed This Encounter  Procedures   Urine Culture    Standing Status:   Future    Expected Date:   05/16/2024    Expiration Date:   05/16/2025   CT ABDOMEN PELVIS W CONTRAST    Standing Status:   Future    Expiration Date:   05/16/2025    If indicated for the ordered procedure, I authorize the administration of contrast media per Radiology protocol:   Yes    Does the patient have a contrast media/X-ray dye  allergy?:   No    Preferred imaging location?:   [facility]    If indicated for the ordered procedure, I authorize the administration of oral contrast media per Radiology protocol:   Yes   CBC w/Diff   Comp Met (CMET)   POCT URINALYSIS DIP (CLINITEK)     No orders of the defined types were placed in this encounter.   No follow-ups on file.  Corean LITTIE Ku, FNP

## 2024-05-17 ENCOUNTER — Ambulatory Visit
Admission: RE | Admit: 2024-05-17 | Discharge: 2024-05-17 | Disposition: A | Discharge status: Home / Self Care | Source: Ambulatory Visit | Attending: Family Medicine | Admitting: Family Medicine

## 2024-05-17 ENCOUNTER — Other Ambulatory Visit: Payer: Self-pay | Admitting: Family Medicine

## 2024-05-17 DIAGNOSIS — R1032 Left lower quadrant pain: Secondary | ICD-10-CM

## 2024-05-17 DIAGNOSIS — K5730 Diverticulosis of large intestine without perforation or abscess without bleeding: Secondary | ICD-10-CM | POA: Diagnosis not present

## 2024-05-17 DIAGNOSIS — K5732 Diverticulitis of large intestine without perforation or abscess without bleeding: Secondary | ICD-10-CM

## 2024-05-17 LAB — URINE CULTURE: Result:: NO GROWTH

## 2024-05-17 MED ORDER — IOPAMIDOL (ISOVUE-370) INJECTION 76%
75.0000 mL | Freq: Once | INTRAVENOUS | Status: AC | PRN
  Administered 2024-05-17: 75 mL via INTRAVENOUS

## 2024-05-17 MED ORDER — AMOXICILLIN-POT CLAVULANATE 875-125 MG PO TABS: 1.0000 | ORAL_TABLET | Freq: Two times a day (BID) | ORAL | 0 refills | Status: AC

## 2024-05-19 NOTE — Telephone Encounter (Signed)
 It was for abdominal CT-it was done.  Thanks

## 2024-06-10 ENCOUNTER — Other Ambulatory Visit (HOSPITAL_COMMUNITY): Payer: Self-pay

## 2024-06-10 ENCOUNTER — Telehealth: Payer: Self-pay

## 2024-06-10 NOTE — Telephone Encounter (Signed)
 Prolia  VOB initiated via [email or web address]  Next Prolia  inj DUE: 07/10/24

## 2024-06-11 NOTE — Telephone Encounter (Deleted)
 [name]

## 2024-06-12 ENCOUNTER — Other Ambulatory Visit (HOSPITAL_COMMUNITY): Payer: Self-pay

## 2024-06-12 NOTE — Telephone Encounter (Signed)
 SABRA

## 2024-06-12 NOTE — Telephone Encounter (Signed)
 ANTHEM PA SUBMITTED VIA [employer]. Transaction ID: [id number]

## 2024-06-12 NOTE — Telephone Encounter (Signed)
 Received call from [facility] for clinical questions. Answered questions and PA is under review.  MZQ#:[id number]

## 2024-06-18 ENCOUNTER — Ambulatory Visit: Payer: Self-pay | Admitting: *Deleted

## 2024-06-18 NOTE — Telephone Encounter (Signed)
 FYI Only or Action Required?: FYI only for provider.  Patient was last seen in primary care on 05/16/2024 by Alvia Corean CROME, FNP.  Called Nurse Triage reporting Hypertension.  Symptoms began today.  Interventions attempted: Prescription medications: propranolol  ER 60 mg .  Symptoms are: gradually worsening.  Triage Disposition: See PCP Within 2 Weeks scheduled to f/u with PCP tomorrow.   Patient/caregiver understands and will follow disposition?: Yes           Copied from CRM [id number]. Topic: Clinical - Red Word Triage >> Jun 18, 2024  4:09 PM Rea C wrote: Red Word that prompted transfer to Nurse Triage: Blood pressure fluctuations and patient is not feeling well.   152/99 pulse 64 at 3:00 pm 138/110 pulse 65 at 3:30 pm 144/79 at 3:45 pm 160/68 pulse 66 at 4 pm  Patient took propanolol this morning, and amlodipine  last night. Tylenol . Patient had a crook in her neck. Reason for Disposition  [1] Systolic BP >= 130 OR Diastolic >= 80 AND [2] taking BP medications  Answer Assessment - Initial Assessment Questions Appt scheduled with other provider for tomorrow. None available with PCP until Thursday. Recommended if sx worsening go to ED.  Patient will be taken amlodipine  for tonight's dose. Encouraged to stay hydrated but not to drink in excess. Patient has not taken lasix  in a long time. Please advise.       1. BLOOD PRESSURE: What is your blood pressure? Did you take at least two measurements 5 minutes apart?   At 4 pm BP 160/68 pulse 66 on call with NT-  BP 157/73 pulse 75 2. ONSET: When did you take your blood pressure?     Now  3. HOW: How did you take your blood pressure? (e.g., automatic home BP monitor, visiting nurse)     Automatic home BP monitors 4. HISTORY: Do you have a history of high blood pressure?     Yes  5. MEDICINES: Are you taking any medicines for blood pressure? Have you missed any doses recently?     Taking medication  and has not missed doses  6. OTHER SYMPTOMS: Do you have any symptoms? (e.g., blurred vision, chest pain, difficulty breathing, headache, weakness)     Left neck pain not severe. Using heat prn. Blood sugar checked for 163 last food eaten at 1 pm. Denies headache no blurred vision no slurred speech, no facial drooping, no weakness N/T in left arm or leg. Numbness and pain in left hand and wrist.  7. PREGNANCY: Is there any chance you are pregnant? When was your last menstrual period?     na  Protocols used: Blood Pressure - High-A-AH

## 2024-06-19 ENCOUNTER — Encounter: Payer: Self-pay | Admitting: Family Medicine

## 2024-06-19 ENCOUNTER — Ambulatory Visit: Admitting: Family Medicine

## 2024-06-19 VITALS — BP 140/78 | HR 67 | Temp 97.6°F | Ht 61.5 in | Wt 169.0 lb

## 2024-06-19 DIAGNOSIS — I951 Orthostatic hypotension: Secondary | ICD-10-CM | POA: Diagnosis not present

## 2024-06-19 DIAGNOSIS — R531 Weakness: Secondary | ICD-10-CM

## 2024-06-19 DIAGNOSIS — I10 Essential (primary) hypertension: Secondary | ICD-10-CM

## 2024-06-19 DIAGNOSIS — R232 Flushing: Secondary | ICD-10-CM | POA: Diagnosis not present

## 2024-06-19 DIAGNOSIS — R002 Palpitations: Secondary | ICD-10-CM | POA: Diagnosis not present

## 2024-06-19 DIAGNOSIS — R42 Dizziness and giddiness: Secondary | ICD-10-CM

## 2024-06-19 LAB — COMPREHENSIVE METABOLIC PANEL WITH GFR
ALT: 93 U/L — ABNORMAL HIGH (ref 0–35)
AST: 65 U/L — ABNORMAL HIGH (ref 0–37)
Albumin: 4.7 g/dL (ref 3.5–5.2)
Alkaline Phosphatase: 150 U/L — ABNORMAL HIGH (ref 39–117)
BUN: 21 mg/dL (ref 6–23)
CO2: 26 meq/L (ref 19–32)
Calcium: 9.5 mg/dL (ref 8.4–10.5)
Chloride: 103 meq/L (ref 96–112)
Creatinine, Ser: 1.16 mg/dL (ref 0.40–1.20)
GFR: 45.15 mL/min — ABNORMAL LOW (ref >60.00)
Glucose, Bld: 107 mg/dL — ABNORMAL HIGH (ref 70–99)
Potassium: 4 meq/L (ref 3.5–5.1)
Sodium: 140 meq/L (ref 135–145)
Total Bilirubin: 0.9 mg/dL (ref 0.2–1.2)
Total Protein: 7.9 g/dL (ref 6.0–8.3)

## 2024-06-19 LAB — CBC WITH DIFFERENTIAL/PLATELET
Basophils Absolute: 0 K/uL (ref 0.0–0.1)
Basophils Relative: 0.5 % (ref 0.0–3.0)
Eosinophils Absolute: 0.1 K/uL (ref 0.0–0.7)
Eosinophils Relative: 1.6 % (ref 0.0–5.0)
HCT: 39.5 % (ref 36.0–46.0)
Hemoglobin: 13.1 g/dL (ref 12.0–15.0)
Lymphocytes Relative: 11.9 % — ABNORMAL LOW (ref 12.0–46.0)
Lymphs Abs: 1 K/uL (ref 0.7–4.0)
MCHC: 33.3 g/dL (ref 30.0–36.0)
MCV: 87.8 fl (ref 78.0–100.0)
Monocytes Absolute: 0.5 K/uL (ref 0.1–1.0)
Monocytes Relative: 6.6 % (ref 3.0–12.0)
Neutro Abs: 6.5 K/uL (ref 1.4–7.7)
Neutrophils Relative %: 79.4 % — ABNORMAL HIGH (ref 43.0–77.0)
Platelets: 219 K/uL (ref 150.0–400.0)
RBC: 4.49 Mil/uL (ref 3.87–5.11)
RDW: 14.1 % (ref 11.5–15.5)
WBC: 8.1 K/uL (ref 4.0–10.5)

## 2024-06-19 LAB — TSH: TSH: 1.42 u[IU]/mL (ref 0.35–5.50)

## 2024-06-19 LAB — MAGNESIUM: Magnesium: 1.9 mg/dL (ref 1.5–2.5)

## 2024-06-19 LAB — VITAMIN B12: Vitamin B-12: 680 pg/mL (ref 211–911)

## 2024-06-19 LAB — T4, FREE: Free T4: 1.01 ng/dL (ref 0.60–1.60)

## 2024-06-19 NOTE — Progress Notes (Signed)
 Subjective:     Patient ID: [name], female    DOB: Jan 29, 1946, 78 y.o.   MRN: [id number]  Chief Complaint  Patient presents with   Hypertension    BP has been high off and on within the last month, has log w her.     HPI  Discussed the use of AI scribe software for clinical note transcription with the patient, who gave verbal consent to proceed.  History of Present Illness [name] is a 78 year old female with hypertension who presents with concerns regarding her blood pressure and associated symptoms.  Blood pressure fluctuations and associated symptoms - History of hypertension with past episodes of elevated blood pressure, including a prior episode over 200/100 mmHg requiring hospitalization - Monitors blood pressure at home and notes fluctuations that cause anxiety - Experiences episodes of heart racing, lightheadedness, hot sensation rising to her ears, and weakness, particularly after eating - Symptoms improve after eating and elevating her feet - During a recent trip to the mountains in early October, experienced lightheadedness and anxiety despite eating breakfast - No chest pain or shortness of breath - Wore a heart monitor approximately four to five years ago  Palpitations and autonomic symptoms - Episodes of heart racing and palpitations, often accompanied by lightheadedness, dizziness, and a sensation of warmth - Symptoms occur particularly after eating and are relieved by elevating her feet - No associated chest pain or shortness of breath  Glycemic control and diabetes management - Diabetes managed with Jardiance  - Blood sugar readings are mostly stable, with a recent morning reading of 126 mg/dL - Highest recorded blood sugar was 175 mg/dL - Monitors blood sugar frequently - No hypoglycemic episodes reported  Urinary frequency and bladder symptoms - Frequent urination - Takes oxybutynin  irregularly - Uses Lasix  occasionally for  swelling in ankles or hands  Psychological stress and anxiety - Recent stressors include son-in-law's heart attack, cousin's stroke, and friend's stent placement - Increased anxiety and general feeling of unwellness related to these events     Health Maintenance Due  Topic Date Due   Hepatitis C Screening  Never done   DTaP/Tdap/Td (2 - Tdap) 02/06/2023   COVID-19 Vaccine (4 - 2025-26 season) 05/06/2024    Past Medical History:  Diagnosis Date   Adenomatous colon polyp 04/08/2011   Anemia    Anxiety    Cataract    Diabetes mellitus without complication (HCC)    pre-diabetic per pt   Essential hypertension 08/10/2007   Chronic  On Catapress (per Dr Cary) - increase to bid - d/c Hydralazine  prn per Dr Fernande d/c 3/19: Increase Inderal  LA to bid   GERD (gastroesophageal reflux disease)    Glaucoma (increased eye pressure)    Heart murmur    HTN (hypertension)    IBS (irritable bowel syndrome)    LBP (low back pain)    Menopause    Osteoporosis    Ovarian cancer (HCC) 09/2008   Dr Cecil   Pancreatic cyst    Vitamin B12 deficiency    Vitamin D  deficiency     Past Surgical History:  Procedure Laterality Date   ABDOMINAL HYSTERECTOMY     APPENDECTOMY     BLADDER SURGERY  04/09/2020   tack and sling   CHOLECYSTECTOMY N/A 05/15/2017   Procedure: LAPAROSCOPIC CHOLECYSTECTOMY WITH INTRAOPERATIVE CHOLANGIOGRAM AND LYSIS OF ADHESIONS;  Surgeon: Gail Favorite, MD;  Location: [facility];  Service: General;  Laterality: N/A;   COLONOSCOPY  2018  Dr.Perry   ELBOW FRACTURE SURGERY     age 26- left elbow   HEMORRHOID SURGERY  2001   Ovarian Cancer Debulking  09/2008   POLYPECTOMY      Family History  Problem Relation Age of Onset   Alzheimer's disease Mother 77   Other Mother 84       TAH for excessive bleeding   Lung cancer Father        smoker; metastasis to stomach and other areas   Heart attack Maternal Uncle    Heart disease Maternal Uncle    Other Paternal Aunt         stomach issues   Heart Problems Paternal Uncle    Other Maternal Grandmother        stomach issues; +hysterectomy   Heart attack Maternal Grandfather    Heart disease Maternal Grandfather    Infertility Daughter    Stomach cancer Cousin        dx. mid-60s   Other Cousin        stomach issues   Leukemia Cousin 18   Stomach cancer Other    Cancer Cousin        unknown type   Other Cousin        stomach issues   Heart Problems Maternal Uncle    Diabetes Paternal Aunt    Heart Problems Paternal Uncle    Emphysema Paternal Uncle        work exposure   Breast cancer Cousin        dx. 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[employer]  Tobacco Use   Smoking status: Never   Smokeless tobacco: Never  Vaping Use   Vaping status: Never Used  Substance and Sexual Activity   Alcohol use: No    Alcohol/week: 0.0 standard drinks of alcohol   Drug use: No   Sexual activity: Not on file  Other Topics Concern   Not on file  Social History Narrative   Lives with spouse/2025   Caffeine  use: cokes      Right handed    Social Drivers of Health   Financial Resource Strain: Low Risk  (03/12/2024)   Overall Financial Resource Strain (CARDIA)    Difficulty of Paying Living Expenses: Not hard at all  Food Insecurity: No Food Insecurity (03/12/2024)   Hunger Vital Sign    Worried About Running Out of Food in the Last Year: Never true    Ran Out of Food in the Last Year: Never true  Transportation Needs: No Transportation Needs (03/12/2024)   PRAPARE - Administrator, Civil Service (Medical): No    Lack of Transportation (Non-Medical): No  Physical  Activity: Inactive (03/12/2024)   Exercise Vital Sign    Days of Exercise per Week: 0 days    Minutes of Exercise per Session: Not on file  Stress: Stress Concern Present (03/12/2024)   [employer] of Occupational Health - Occupational Stress Questionnaire    Feeling of Stress: To some extent  Social Connections: Socially Integrated (03/12/2024)   Social Connection and Isolation Panel  Frequency of Communication with Friends and Family: More than three times a week    Frequency of Social Gatherings with Friends and Family: More than three times a week    Attends Religious Services: More than 4 times per year    Active Member of [employer] or Organizations: Yes    Attends Engineer, structural: More than 4 times per year    Marital Status: Married  Catering manager Violence: Not At Risk (03/13/2024)   Humiliation, Afraid, Rape, and Kick questionnaire    Fear of Current or Ex-Partner: No    Emotionally Abused: No    Physically Abused: No    Sexually Abused: No    Outpatient Medications Prior to Visit  Medication Sig Dispense Refill   amLODipine  (NORVASC ) 5 MG tablet TAKE 1 TABLET (5 MG TOTAL) BY MOUTH DAILY. 90 tablet 2   aspirin  81 MG EC tablet Take 81 mg by mouth daily.     bimatoprost  (LUMIGAN ) 0.01 % SOLN Place 1 drop into both eyes at bedtime.     Blood Glucose Monitoring Suppl (ONETOUCH VERIO FLEX SYSTEM) w/Device KIT 1 each by Does not apply route in the morning, at noon, and at bedtime.      butalbital -acetaminophen -caffeine  (FIORICET) 50-325-40 MG tablet Take 1 tablet by mouth every 6 (six) hours as needed for headache. 60 tablet 0   [facility] DIGESTIVE PROBIOTIC 250 MG capsule TAKE 1 CAPSULE BY MOUTH 2 TIMES DAILY. 50 capsule 2   Cyanocobalamin  (NASCOBAL ) 500 MCG/0.1ML SOLN USE 1 SPRAY IN 1 NOSTRIL  ONCE A WEEK 12 each 3   DENTA 5000 PLUS 1.1 % CREA dental cream Take by mouth as directed.     diphenoxylate -atropine  (LOMOTIL ) 2.5-0.025 MG tablet Take 1 tablet by mouth 4 (four)  times daily as needed for diarrhea or loose stools. 60 tablet 5   empagliflozin  (JARDIANCE ) 10 MG TABS tablet Take 1 tablet (10 mg total) by mouth daily with breakfast. 90 tablet 3   fluticasone  (FLONASE ) 50 MCG/ACT nasal spray Place 1 spray into both nostrils daily. 15.8 mL 0   glucose blood (ONETOUCH VERIO) test strip 1 each by Other route daily. 100 strip 3   Lancets (ONETOUCH DELICA PLUS LANCET30G) MISC Check blood sugar alternating fasting and 2 hours after meals 200 each 2   LORazepam  (ATIVAN ) 0.5 MG tablet Take 1 tablet (0.5 mg total) by mouth 2 (two) times daily as needed for anxiety. 60 tablet 2   methocarbamol  (ROBAXIN ) 500 MG tablet Take 1 tablet (500 mg total) by mouth every 8 (eight) hours as needed for muscle spasms. 90 tablet 1   ONETOUCH VERIO test strip CHECK BLOOD SUGAR ALTERNATING FASTING AND 2 HOURS AFTER MEALS 200 strip 3   oxybutynin  (DITROPAN ) 5 MG tablet Take 1 tablet (5 mg total) by mouth every 8 (eight) hours as needed for bladder spasms. 90 tablet 3   polycarbophil (FIBERCON) 625 MG tablet Take 2 tablets (1,250 mg total) by mouth 2 (two) times daily.     PROLIA  60 MG/ML SOSY injection INJECT 1 SYRINGE UNDER THE SKIN ONCE EVERY 6 MONTHS 1 mL 0   propranolol  ER (INDERAL  LA) 60 MG 24 hr capsule TAKE 1 CAPSULE BY MOUTH 2 TIMES DAILY. 180 capsule 3   RABEprazole  (ACIPHEX ) 20 MG tablet Take 1 tablet (20 mg total) by mouth daily. 90 tablet 3   rosuvastatin  (CRESTOR ) 5 MG tablet Take 1 tablet (5 mg total) by mouth daily. 90 tablet 3   timolol  (TIMOPTIC ) 0.5 % ophthalmic  solution Place 1 drop into both eyes 2 (two) times daily.     triamcinolone  cream (KENALOG) 0.1 % APPLY TO AFFECTED AREA TWICE A DAY AS NEEDED     Vitamin D , Ergocalciferol , (DRISDOL ) 1.25 MG (50000 UNIT) CAPS capsule Take 1 capsule (50,000 Units total) by mouth every 30 (thirty) days. 3 capsule 3   VITAMIN D3 1.25 MG (50000 UT) capsule TAKE ONE CAPSULE BY MOUTH EVERY 30 DAYS 9 capsule 1   furosemide  (LASIX ) 20 MG  tablet TAKE 0.5-1 TABLETS (10-20 MG TOTAL) BY MOUTH DAILY AS NEEDED FOR EDEMA (FOR LEG SWELLING). (Patient not taking: Reported on 06/19/2024) 90 tablet 1   KLOR-CON  M20 20 MEQ tablet TAKE 1 TABLET BY MOUTH EVERY DAY (Patient not taking: Reported on 06/19/2024) 90 tablet 3   No facility-administered medications prior to visit.    Allergies  Allergen Reactions   Boniva [Ibandronate Sodium]     cramp   Calcium  Channel Blockers     Upset stomach   Chlorthalidone      Diarrhea per pt   Compazine     Daughter reacts to compazine/pt does not want to take   Hyoscyamine      Dry mouth   Lyrica  [Pregabalin ]     Dizzy    Risedronate Sodium Other (See Comments)    Upset stomach   Tape Rash    redness    ROS Per HPI    Objective:    Physical Exam Constitutional:      General: She is not in acute distress.    Appearance: She is ill-appearing.  HENT:     Right Ear: Tympanic membrane and ear canal normal.     Left Ear: Tympanic membrane and ear canal normal.     Nose: Nose normal.     Mouth/Throat:     Mouth: Mucous membranes are moist.     Pharynx: Oropharynx is clear.  Eyes:     Extraocular Movements: Extraocular movements intact.     Conjunctiva/sclera: Conjunctivae normal.  Cardiovascular:     Rate and Rhythm: Normal rate and regular rhythm.  Pulmonary:     Effort: Pulmonary effort is normal.     Breath sounds: Normal breath sounds.  Musculoskeletal:     Cervical back: Normal range of motion and neck supple.     Right lower leg: No edema.     Left lower leg: No edema.  Skin:    General: Skin is warm and dry.  Neurological:     General: No focal deficit present.     Mental Status: She is alert and oriented to person, place, and time.     Cranial Nerves: No cranial nerve deficit or facial asymmetry.     Sensory: No sensory deficit.     Motor: No weakness or tremor.     Coordination: Coordination normal.     Gait: Gait normal.  Psychiatric:        Attention and  Perception: Attention normal.        Mood and Affect: Mood is anxious.        Speech: Speech normal.        Behavior: Behavior normal.        Thought Content: Thought content normal.      BP (!) 140/78   Pulse 67   Temp 97.6 F (36.4 C) (Temporal)   Ht 5' 1.5 (1.562 m)   Wt 169 lb (76.7 kg)   SpO2 98%   BMI 31.42 kg/m  Wt Readings from Last  3 Encounters:  06/19/24 169 lb (76.7 kg)  05/16/24 172 lb 12.8 oz (78.4 kg)  04/09/24 175 lb (79.4 kg)       Assessment & Plan:   Problem List Items Addressed This Visit     Hot flashes   Relevant Orders   Vitamin B12 (Completed)   TSH (Completed)   T4, free (Completed)   Other Visit Diagnoses       Orthostatic hypotension    -  Primary   Relevant Orders   CBC with Differential/Platelet (Completed)   Comprehensive metabolic panel with GFR (Completed)     Palpitations       Relevant Orders   EKG 12-Lead   CBC with Differential/Platelet (Completed)   Comprehensive metabolic panel with GFR (Completed)   Magnesium  (Completed)   Vitamin B12 (Completed)   TSH (Completed)   T4, free (Completed)     Dizziness       Relevant Orders   EKG 12-Lead   Magnesium  (Completed)   Vitamin B12 (Completed)   TSH (Completed)   T4, free (Completed)     Spell of generalized weakness       Relevant Orders   Magnesium  (Completed)   Vitamin B12 (Completed)   TSH (Completed)   T4, free (Completed)     Essential hypertension       Relevant Orders   EKG 12-Lead   CBC with Differential/Platelet (Completed)   Comprehensive metabolic panel with GFR (Completed)       Assessment and Plan Assessment & Plan Hypertension Blood pressure readings are variable, with episodes of hypertension and normotension. Anxiety may contribute to these fluctuations. - Check orthostatic vitals  - Perform EKG to assess for changes. - Advise stress reduction regarding blood sugar to aid blood pressure management.  Type 2 diabetes mellitus Blood sugar  levels are generally well-controlled with occasional concerning readings. No hypoglycemia reported. Current regimen includes Jardiance . Somewhat anxious about blood sugars.  - Advise less frequent blood sugar monitoring unless symptomatic. - Continue current diabetes management regimen.  Overactive bladder with urinary frequency and urgency Experiencing frequent urination and urgency. Not taking prescribed oxybutynin . - Encourage taking oxybutynin  as prescribed.  Generalized anxiety symptoms Experiencing anxiety, particularly related to health concerns, potentially exacerbating other symptoms. Uses lorazepam  sparingly. - Encourage engaging in activities like walking or other distraction techniques to manage anxiety.  Orthostatic hypotension Reports dizziness and lightheadedness. Normal neurological exam.  - vitals signs indicate she is orthostatic. Encouraged her to increase fluid intake or she could get worse and need to go to the ED. She and her husband understand. Continue holding diuretic unless edema  - check labs including renal function, electrolytes  EKG shows sinus bradycardia, rate 56   Encourage follow up with PCP in the next 2 weeks.    I am having Lamarr L. Rodgers MURRAY maintain her aspirin  EC, bimatoprost , OneTouch Verio Flex System, timolol , CVS Digestive Probiotic, Denta 5000 Plus, triamcinolone  cream, Klor-Con  M20, Vitamin D3, Prolia , OneTouch Delica Plus Lancet30G, butalbital -acetaminophen -caffeine , propranolol  ER, fluticasone , methocarbamol , diphenoxylate -atropine , RABEprazole , amLODipine , Cyanocobalamin , FiberCon, furosemide , empagliflozin , rosuvastatin , OneTouch Verio, OneTouch Verio, LORazepam , oxybutynin , and Vitamin D  (Ergocalciferol ).  No orders of the defined types were placed in this encounter.

## 2024-06-19 NOTE — Patient Instructions (Addendum)
 Please increase your fluid intake.   Orthostatic Hypotension Blood pressure is a measurement of how strongly, or weakly, your circulating blood is pressing against the walls of your arteries. Orthostatic hypotension is a drop in blood pressure that can happen when you change positions, such as when you go from lying down to standing. Arteries are blood vessels that carry blood from your heart throughout your body. When blood pressure is too low, you may not get enough blood to your brain or to the rest of your organs. Orthostatic hypotension can cause light-headedness, sweating, rapid heartbeat, blurred vision, and fainting. These symptoms require further investigation into the cause. What are the causes? Orthostatic hypotension can be caused by many things, including: Sudden changes in posture, such as standing up quickly after you have been sitting or lying down. Loss of blood (anemia) or loss of body fluids (dehydration). Heart problems, neurologic problems, or hormone problems. Pregnancy. Aging. The risk for this condition increases as you get older. Severe infection (sepsis). Certain medicines, such as medicines for high blood pressure or medicines that make the body lose excess fluids (diuretics). What are the signs or symptoms? Symptoms of this condition may include: Weakness, light-headedness, or dizziness. Sweating. Blurred vision. Tiredness (fatigue). Rapid heartbeat. Fainting, in severe cases. How is this diagnosed? This condition is diagnosed based on: Your symptoms and medical history. Your blood pressure measurements. Your health care provider will check your blood pressure when you are: Lying down. Sitting. Standing. A blood pressure reading is recorded as two numbers, such as 120 over 80 (or 120/80). The first (top) number is called the systolic pressure. It is a measure of the pressure in your arteries as your heart beats. The second (bottom) number is called the  diastolic pressure. It is a measure of the pressure in your arteries when your heart relaxes between beats. Blood pressure is measured in a unit called mmHg. Healthy blood pressure for most adults is 120/80 mmHg. Orthostatic hypotension is defined as a 20 mmHg drop in systolic pressure or a 10 mmHg drop in diastolic pressure within 3 minutes of standing. Other information or tests that may be used to diagnose orthostatic hypotension include: Your other vital signs, such as your heart rate and temperature. Blood tests. An electrocardiogram (ECG) or echocardiogram. A Holter monitor. This is a device you wear that records your heart rhythm continuously, usually for 24-48 hours. Tilt table test. For this test, you will be safely secured to a table that moves you from a lying position to an upright position. Your heart rhythm and blood pressure will be monitored during the test. How is this treated? This condition may be treated by: Changing your diet. This may involve eating more salt (sodium) or drinking more water. Changing the dosage of certain medicines you are taking that might be lowering your blood pressure. Correcting the underlying reason for the orthostatic hypotension. Wearing compression stockings. Taking medicines to raise your blood pressure. Avoiding actions that trigger symptoms. Follow these instructions at home: Medicines Take over-the-counter and prescription medicines only as told by your health care provider. Follow instructions from your health care provider about changing the dosage of your current medicines, if this applies. Do not stop or adjust any of your medicines on your own. Eating and drinking  Drink enough fluid to keep your urine pale yellow. Eat extra salt only as directed. Do not add extra salt to your diet unless advised by your health care provider. Eat frequent, small meals. Avoid standing  up suddenly after eating. General instructions  Get up slowly from  lying down or sitting positions. This gives your blood pressure a chance to adjust. Avoid hot showers and excessive heat as directed by your health care provider. Engage in regular physical activity as directed by your health care provider. If you have compression stockings, wear them as told. Keep all follow-up visits. This is important. Contact a health care provider if: You have a fever for more than 2-3 days. You feel more thirsty than usual. You feel dizzy or weak. Get help right away if: You have chest pain. You have a fast or irregular heartbeat. You become sweaty or feel light-headed. You feel short of breath. You faint. You have any symptoms of a stroke. BE FAST is an easy way to remember the main warning signs of a stroke: B - Balance. Signs are dizziness, sudden trouble walking, or loss of balance. E - Eyes. Signs are trouble seeing or a sudden change in vision. F - Face. Signs are sudden weakness or numbness of the face, or the face or eyelid drooping on one side. A - Arms. Signs are weakness or numbness in an arm. This happens suddenly and usually on one side of the body. S - Speech. Signs are sudden trouble speaking, slurred speech, or trouble understanding what people say. T - Time. Time to call emergency services. Write down what time symptoms started. You have other signs of a stroke, such as: A sudden, severe headache with no known cause. Nausea or vomiting. Seizure. These symptoms may represent a serious problem that is an emergency. Do not wait to see if the symptoms will go away. Get medical help right away. Call your local emergency services (911 in the [place] Do not drive yourself to the hospital. Summary Orthostatic hypotension is a sudden drop in blood pressure. It can cause light-headedness, sweating, rapid heartbeat, blurred vision, and fainting. Orthostatic hypotension can be diagnosed by having your blood pressure taken while lying down, sitting, and then  standing. Treatment may involve changing your diet, wearing compression stockings, sitting up slowly, adjusting your medicines, or correcting the underlying reason for the orthostatic hypotension. Get help right away if you have chest pain, a fast or irregular heartbeat, or symptoms of a stroke. This information is not intended to replace advice given to you by your health care provider. Make sure you discuss any questions you have with your health care provider. Document Revised: 11/05/2020 Document Reviewed: 11/05/2020 Elsevier Patient Education  2024 [employer].

## 2024-06-19 NOTE — Telephone Encounter (Signed)
 Called ins to follow up on PA request. PA is still pending review.  CASE #: [id number]

## 2024-06-20 ENCOUNTER — Other Ambulatory Visit: Payer: Self-pay | Admitting: Family Medicine

## 2024-06-20 ENCOUNTER — Ambulatory Visit: Payer: Self-pay | Admitting: Family Medicine

## 2024-06-20 DIAGNOSIS — R7989 Other specified abnormal findings of blood chemistry: Secondary | ICD-10-CM

## 2024-06-20 NOTE — Progress Notes (Signed)
 Please call and ask how she is feeling today and what her current symptoms include. Her liver function tests are elevated. I would like for her to get an US  to take a look at her liver and follow up for a lab visit on Monday. Does she drink alcohol at all? If so, avoid until labs are rechecked. I will order the labs.

## 2024-06-21 NOTE — Telephone Encounter (Signed)
 Noted.  The patient saw Orie Lauth NP.  Keep ROV with me.  Thanks

## 2024-06-24 ENCOUNTER — Other Ambulatory Visit (INDEPENDENT_AMBULATORY_CARE_PROVIDER_SITE_OTHER)

## 2024-06-24 DIAGNOSIS — R7989 Other specified abnormal findings of blood chemistry: Secondary | ICD-10-CM | POA: Diagnosis not present

## 2024-06-24 LAB — HEPATIC FUNCTION PANEL
ALT: 91 U/L — ABNORMAL HIGH (ref 0–35)
AST: 77 U/L — ABNORMAL HIGH (ref 0–37)
Albumin: 4.4 g/dL (ref 3.5–5.2)
Alkaline Phosphatase: 181 U/L — ABNORMAL HIGH (ref 39–117)
Bilirubin, Direct: 0.2 mg/dL (ref 0.0–0.3)
Total Bilirubin: 0.8 mg/dL (ref 0.2–1.2)
Total Protein: 7.2 g/dL (ref 6.0–8.3)

## 2024-06-24 LAB — BASIC METABOLIC PANEL WITH GFR
BUN: 21 mg/dL (ref 6–23)
CO2: 23 meq/L (ref 19–32)
Calcium: 8.9 mg/dL (ref 8.4–10.5)
Chloride: 105 meq/L (ref 96–112)
Creatinine, Ser: 1.1 mg/dL (ref 0.40–1.20)
GFR: 48.12 mL/min — ABNORMAL LOW (ref >60.00)
Glucose, Bld: 158 mg/dL — ABNORMAL HIGH (ref 70–99)
Potassium: 3.8 meq/L (ref 3.5–5.1)
Sodium: 138 meq/L (ref 135–145)

## 2024-06-24 LAB — CBC
HCT: 36 % (ref 36.0–46.0)
Hemoglobin: 12.2 g/dL (ref 12.0–15.0)
MCHC: 33.8 g/dL (ref 30.0–36.0)
MCV: 88.3 fl (ref 78.0–100.0)
Platelets: 188 K/uL (ref 150.0–400.0)
RBC: 4.07 Mil/uL (ref 3.87–5.11)
RDW: 13.9 % (ref 11.5–15.5)
WBC: 5.2 K/uL (ref 4.0–10.5)

## 2024-06-25 ENCOUNTER — Ambulatory Visit: Payer: Self-pay | Admitting: Family Medicine

## 2024-06-25 ENCOUNTER — Other Ambulatory Visit: Payer: Self-pay | Admitting: Family Medicine

## 2024-06-25 DIAGNOSIS — R7989 Other specified abnormal findings of blood chemistry: Secondary | ICD-10-CM

## 2024-06-25 NOTE — Progress Notes (Signed)
 Her liver function tests are still elevated. I am ordering an ultrasound to evaluate her liver. She should receive a call to get this done. Follow up with PCP as scheduled or sooner if he has a cancellation.

## 2024-06-26 ENCOUNTER — Ambulatory Visit: Payer: Self-pay | Admitting: Family Medicine

## 2024-06-26 ENCOUNTER — Other Ambulatory Visit (HOSPITAL_COMMUNITY): Payer: Self-pay

## 2024-06-26 ENCOUNTER — Ambulatory Visit
Admission: RE | Admit: 2024-06-26 | Discharge: 2024-06-26 | Disposition: A | Discharge status: Home / Self Care | Source: Ambulatory Visit | Attending: Family Medicine | Admitting: Family Medicine

## 2024-06-26 DIAGNOSIS — R945 Abnormal results of liver function studies: Secondary | ICD-10-CM | POA: Diagnosis not present

## 2024-06-26 DIAGNOSIS — R7989 Other specified abnormal findings of blood chemistry: Secondary | ICD-10-CM

## 2024-06-26 NOTE — Telephone Encounter (Signed)
 Pt ready for scheduling for PROLIA  on or after : 07/10/24  Option# 1: Buy/Bill (Office supplied medication)  Out-of-pocket cost due at time of clinic visit: $38  Number of injection/visits approved: 2  Primary: MEDICARE Prolia  co-insurance: 2% Admin fee co-insurance: 2%  Secondary: [employer] Prolia  co-insurance:  Admin fee co-insurance:   Medical Benefit Details: Date Benefits were checked: 06/11/24 Deductible: $257 Met of $257 Required (MEDICARE), $400 Met of $400 Required ([employer]BS[employer])  / Coinsurance: 2%/ Admin Fee: 2%  Prior Auth: N/A (MEDICARE) PA# Expiration Date:   # of doses approved:  Prior Auth: APPROVED PA# [id number] Expiration Date: 07/10/24-07/09/25  # of doses approved: 2  ----------------------------------------------------------------------- Option# 2- Med Obtained from pharmacy:  Pharmacy benefit: Copay $--- MUST FILL AT SPECIALTY (Paid to pharmacy) Admin Fee: 2% (Pay at clinic)  Prior Auth: APPROVED PA#  Expiration Date: 09/27/24   # of doses approved:    If patient wants fill through the pharmacy benefit please send prescription to: ---, and include estimated need by date in rx notes. Pharmacy will ship medication directly to the office.  Patient NOT eligible for Prolia  Copay Card. Copay Card can make patient's cost as little as $25. Link to apply: [email or web address]://[email or web address].[email or web address]  ** This summary of benefits is an estimation of the patient's out-of-pocket cost. Exact cost may very based on individual plan coverage.

## 2024-06-26 NOTE — Progress Notes (Signed)
 Her US  is negative. Nothing concerning. How is she feeling? Does she need to get in to be seen sooner than as scheduled with PCP?

## 2024-06-26 NOTE — Telephone Encounter (Signed)
 MEDICAL PA APPROVED

## 2024-06-27 ENCOUNTER — Other Ambulatory Visit

## 2024-06-30 ENCOUNTER — Other Ambulatory Visit: Payer: Self-pay | Admitting: Internal Medicine

## 2024-07-03 ENCOUNTER — Ambulatory Visit: Attending: Internal Medicine

## 2024-07-03 ENCOUNTER — Ambulatory Visit (INDEPENDENT_AMBULATORY_CARE_PROVIDER_SITE_OTHER): Admitting: Internal Medicine

## 2024-07-03 ENCOUNTER — Encounter: Payer: Self-pay | Admitting: Internal Medicine

## 2024-07-03 VITALS — BP 140/70 | HR 64 | Temp 97.9°F | Ht 61.5 in | Wt 166.4 lb

## 2024-07-03 DIAGNOSIS — E669 Obesity, unspecified: Secondary | ICD-10-CM

## 2024-07-03 DIAGNOSIS — I10 Essential (primary) hypertension: Secondary | ICD-10-CM | POA: Diagnosis not present

## 2024-07-03 DIAGNOSIS — E538 Deficiency of other specified B group vitamins: Secondary | ICD-10-CM | POA: Diagnosis not present

## 2024-07-03 DIAGNOSIS — N3281 Overactive bladder: Secondary | ICD-10-CM | POA: Diagnosis not present

## 2024-07-03 DIAGNOSIS — R002 Palpitations: Secondary | ICD-10-CM | POA: Diagnosis not present

## 2024-07-03 DIAGNOSIS — R7989 Other specified abnormal findings of blood chemistry: Secondary | ICD-10-CM | POA: Insufficient documentation

## 2024-07-03 DIAGNOSIS — F419 Anxiety disorder, unspecified: Secondary | ICD-10-CM | POA: Diagnosis not present

## 2024-07-03 DIAGNOSIS — R42 Dizziness and giddiness: Secondary | ICD-10-CM

## 2024-07-03 DIAGNOSIS — E119 Type 2 diabetes mellitus without complications: Secondary | ICD-10-CM | POA: Diagnosis not present

## 2024-07-03 NOTE — Assessment & Plan Note (Signed)
 On B12

## 2024-07-03 NOTE — Progress Notes (Signed)
 Subjective:  Patient ID: [name], female    DOB: 1946/03/25  Age: 78 y.o. MRN: [id number]  CC: Medical Management of Chronic Issues (3 Month follow up)   HPI [name] presents for elevated liver enzymes after she started Crestor  daily since Auust (Dr Gevena was taking it 2/week prior w/Dr Von).LFTs were nl in Sept 2025. C/o fullness Liver US  was ok - s/p chole (06/26/24) SBP 140-170 at home Taking Propranolol  once a day, not twice a day Here w/Gary C/o palpitations  Here w/Gary   Outpatient Medications Prior to Visit  Medication Sig Dispense Refill   amLODipine  (NORVASC ) 5 MG tablet TAKE 1 TABLET (5 MG TOTAL) BY MOUTH DAILY. 90 tablet 2   aspirin  81 MG EC tablet Take 81 mg by mouth daily.     bimatoprost  (LUMIGAN ) 0.01 % SOLN Place 1 drop into both eyes at bedtime.     Blood Glucose Monitoring Suppl (ONETOUCH VERIO FLEX SYSTEM) w/Device KIT 1 each by Does not apply route in the morning, at noon, and at bedtime.      Cholecalciferol  (VITAMIN D3) 1.25 MG (50000 UT) CAPS TAKE 1 CAPSULE BY MOUTH EVERY 30 DAYS 3 capsule 3   [employer] DIGESTIVE PROBIOTIC 250 MG capsule TAKE 1 CAPSULE BY MOUTH 2 TIMES DAILY. 50 capsule 2   Cyanocobalamin  (NASCOBAL ) 500 MCG/0.1ML SOLN USE 1 SPRAY IN 1 NOSTRIL  ONCE A WEEK 12 each 3   DENTA 5000 PLUS 1.1 % CREA dental cream Take by mouth as directed.     diphenoxylate -atropine  (LOMOTIL ) 2.5-0.025 MG tablet Take 1 tablet by mouth 4 (four) times daily as needed for diarrhea or loose stools. 60 tablet 5   empagliflozin  (JARDIANCE ) 10 MG TABS tablet Take 1 tablet (10 mg total) by mouth daily with breakfast. 90 tablet 3   fluticasone  (FLONASE ) 50 MCG/ACT nasal spray Place 1 spray into both nostrils daily. 15.8 mL 0   glucose blood (ONETOUCH VERIO) test strip 1 each by Other route daily. 100 strip 3   Lancets (ONETOUCH DELICA PLUS LANCET30G) MISC Check blood sugar alternating fasting and 2 hours after meals 200 each 2   LORazepam  (ATIVAN ) 0.5 MG  tablet Take 1 tablet (0.5 mg total) by mouth 2 (two) times daily as needed for anxiety. 60 tablet 2   methocarbamol  (ROBAXIN ) 500 MG tablet Take 1 tablet (500 mg total) by mouth every 8 (eight) hours as needed for muscle spasms. 90 tablet 1   ONETOUCH VERIO test strip CHECK BLOOD SUGAR ALTERNATING FASTING AND 2 HOURS AFTER MEALS 200 strip 3   oxybutynin  (DITROPAN ) 5 MG tablet Take 1 tablet (5 mg total) by mouth every 8 (eight) hours as needed for bladder spasms. 90 tablet 3   polycarbophil (FIBERCON) 625 MG tablet Take 2 tablets (1,250 mg total) by mouth 2 (two) times daily.     PROLIA  60 MG/ML SOSY injection INJECT 1 SYRINGE UNDER THE SKIN ONCE EVERY 6 MONTHS 1 mL 0   propranolol  ER (INDERAL  LA) 60 MG 24 hr capsule TAKE 1 CAPSULE BY MOUTH 2 TIMES DAILY. 180 capsule 3   RABEprazole  (ACIPHEX ) 20 MG tablet Take 1 tablet (20 mg total) by mouth daily. 90 tablet 3   rosuvastatin  (CRESTOR ) 5 MG tablet Take 1 tablet (5 mg total) by mouth daily. 90 tablet 3   timolol  (TIMOPTIC ) 0.5 % ophthalmic solution Place 1 drop into both eyes 2 (two) times daily.     triamcinolone  cream (KENALOG) 0.1 % APPLY TO AFFECTED AREA TWICE  A DAY AS NEEDED     Vitamin D , Ergocalciferol , (DRISDOL ) 1.25 MG (50000 UNIT) CAPS capsule Take 1 capsule (50,000 Units total) by mouth every 30 (thirty) days. 3 capsule 3   butalbital -acetaminophen -caffeine  (FIORICET) 50-325-40 MG tablet Take 1 tablet by mouth every 6 (six) hours as needed for headache. 60 tablet 0   furosemide  (LASIX ) 20 MG tablet TAKE 0.5-1 TABLETS (10-20 MG TOTAL) BY MOUTH DAILY AS NEEDED FOR EDEMA (FOR LEG SWELLING). (Patient not taking: Reported on 07/03/2024) 90 tablet 1   KLOR-CON  M20 20 MEQ tablet TAKE 1 TABLET BY MOUTH EVERY DAY (Patient not taking: Reported on 06/19/2024) 90 tablet 3   No facility-administered medications prior to visit.    ROS: Review of Systems  Constitutional:  Positive for fatigue. Negative for activity change, appetite change, chills and  unexpected weight change.  HENT:  Negative for congestion, mouth sores and sinus pressure.   Eyes:  Negative for visual disturbance.  Respiratory:  Negative for cough and chest tightness.   Cardiovascular:  Positive for palpitations and leg swelling.  Gastrointestinal:  Positive for abdominal distention and diarrhea. Negative for abdominal pain and nausea.  Genitourinary:  Positive for frequency and urgency. Negative for difficulty urinating and vaginal pain.  Musculoskeletal:  Positive for arthralgias and back pain. Negative for gait problem.  Skin:  Negative for pallor and rash.  Neurological:  Positive for dizziness. Negative for tremors, weakness, numbness and headaches.  Hematological:  Bruises/bleeds easily.  Psychiatric/Behavioral:  Positive for decreased concentration. Negative for confusion and sleep disturbance. The patient is nervous/anxious.     Objective:  BP (!) 140/70   Pulse 64   Temp 97.9 F (36.6 C)   Ht 5' 1.5 (1.562 m)   Wt 166 lb 6.4 oz (75.5 kg)   SpO2 99%   BMI 30.93 kg/m   BP Readings from Last 3 Encounters:  07/03/24 (!) 140/70  06/19/24 (!) 140/78  05/16/24 124/78    Wt Readings from Last 3 Encounters:  07/03/24 166 lb 6.4 oz (75.5 kg)  06/19/24 169 lb (76.7 kg)  05/16/24 172 lb 12.8 oz (78.4 kg)    Physical Exam Constitutional:      General: She is not in acute distress.    Appearance: She is well-developed. She is obese.  HENT:     Head: Normocephalic.     Right Ear: External ear normal.     Left Ear: External ear normal.     Nose: Nose normal.  Eyes:     General:        Right eye: No discharge.        Left eye: No discharge.     Conjunctiva/sclera: Conjunctivae normal.     Pupils: Pupils are equal, round, and reactive to light.  Neck:     Thyroid : No thyromegaly.     Vascular: No JVD.     Trachea: No tracheal deviation.  Cardiovascular:     Rate and Rhythm: Normal rate and regular rhythm.     Heart sounds: Normal heart sounds.   Pulmonary:     Effort: No respiratory distress.     Breath sounds: No stridor. No wheezing.  Abdominal:     General: Bowel sounds are normal. There is no distension.     Palpations: Abdomen is soft. There is no mass.     Tenderness: There is no abdominal tenderness. There is no guarding or rebound.  Musculoskeletal:        General: No tenderness.  Cervical back: Normal range of motion and neck supple. No rigidity.     Right lower leg: No edema.     Left lower leg: No edema.  Lymphadenopathy:     Cervical: No cervical adenopathy.  Skin:    Findings: No erythema or rash.  Neurological:     Mental Status: She is oriented to person, place, and time.     Cranial Nerves: No cranial nerve deficit.     Motor: No abnormal muscle tone.     Coordination: Coordination normal.     Deep Tendon Reflexes: Reflexes normal.  Psychiatric:        Behavior: Behavior normal.        Thought Content: Thought content normal.        Judgment: Judgment normal.   Pt looks tired, anxious Occ PVCs    A total time of 45 minutes was spent preparing to see the patient, reviewing tests, x-rays,reports and other medical records.  Also, obtaining history and performing comprehensive physical exam.  Additionally, counseling the patient regarding the above listed issues.   Finally, documenting clinical information in the health records, coordination of care, educating the patient re palpitations, ZIO patch monitor, labile BP, OAB. It is a complex case.  Lab Results  Component Value Date   WBC 5.2 06/24/2024   HGB 12.2 06/24/2024   HCT 36.0 06/24/2024   PLT 188.0 06/24/2024   GLUCOSE 158 (H) 06/24/2024   CHOL 177 04/02/2024   TRIG 159 (H) 04/02/2024   HDL 53 04/02/2024   LDLDIRECT 122.0 05/24/2022   LDLCALC 98 04/02/2024   ALT 91 (H) 06/24/2024   AST 77 (H) 06/24/2024   NA 138 06/24/2024   K 3.8 06/24/2024   CL 105 06/24/2024   CREATININE 1.10 06/24/2024   BUN 21 06/24/2024   CO2 23 06/24/2024    TSH 1.42 06/19/2024   HGBA1C 6.2 (H) 04/02/2024   MICROALBUR 9.8 04/02/2024    US  Abdomen Limited RUQ (LIVER/GB) Result Date: 06/26/2024 CLINICAL DATA:  abnormal liver function tests EXAM: ULTRASOUND ABDOMEN LIMITED RIGHT UPPER QUADRANT COMPARISON:  05/17/2024 FINDINGS: Gallbladder: Cholecystectomy Common bile duct: Diameter: 6 mm Liver: Normal echogenicity. No focal lesion identified. No intrahepatic biliary ductal dilation. Portal vein is patent on color Doppler imaging with normal direction of blood flow towards the liver. Right Kidney: Partially visualized. No mass. No hydronephrosis or nephrolithiasis. Other: None. IMPRESSION: Cholecystectomy. No intrahepatic or extrahepatic biliary ductal dilation. Electronically Signed   By: Rogelia Myers M.D.   On: 06/26/2024 12:15    Assessment & Plan:   Problem List Items Addressed This Visit     Anxiety   Worse Use Lorazepam  more often Labile HTN: SBP 140-170 Taking Propranolol  once a day, not twice a day Go back on Propranolol  bid       B12 deficiency   On B12      Dizziness, nonspecific   On Oxybutynin  - dry  mouth, dizzy      Relevant Orders   LONG TERM MONITOR (3-14 DAYS)   Elevated LFTs   New. Pt started Crestor  daily since Auust (Dr Gevena was taking it 2/week prior w/Dr Von).LFTs were nl in Sept 2025. Hold Crestor  Check LFTs in 1 month      Relevant Orders   Comprehensive metabolic panel with GFR   HTN (hypertension)   Labile HTN: SBP 140-170 Taking Propranolol  once a day, not twice a day Go back on Propranolol  bid F/u w/Dr Croitoru      Relevant Orders  Ambulatory referral to Cardiology   OAB (overactive bladder)   Use Ditropan  prn  Potential benefits of a long term Ditropan   use as well as potential risks  and complications were explained to the patient and were aknowledged. Ok to reduce Jardiance  to 1/2 tab      Palpitations - Primary   Labile HTN: SBP 140-170 Taking Propranolol  once a day, not  twice a day Go back on Propranolol  bid F/u w/Dr Croitoru Zio patch      Relevant Orders   LONG TERM MONITOR (3-14 DAYS)   Ambulatory referral to Cardiology   Type 2 diabetes mellitus in patient with obesity (HCC)   F/u w/Dr Von On Jardiance          No orders of the defined types were placed in this encounter.     Follow-up: Return in about 6 weeks (around 08/14/2024) for a follow-up visit.  Marolyn Noel, MD

## 2024-07-03 NOTE — Patient Instructions (Signed)
Stop Crestor.

## 2024-07-03 NOTE — Assessment & Plan Note (Signed)
F/u w/Dr Kumar On Jardiance 

## 2024-07-03 NOTE — Progress Notes (Unsigned)
 EP to read.

## 2024-07-03 NOTE — Assessment & Plan Note (Signed)
 Labile HTN: SBP 140-170 Taking Propranolol  once a day, not twice a day Go back on Propranolol  bid F/u w/Dr Croitoru Zio patch

## 2024-07-03 NOTE — Assessment & Plan Note (Signed)
 Use Ditropan  prn  Potential benefits of a long term Ditropan   use as well as potential risks  and complications were explained to the patient and were aknowledged. Ok to reduce Jardiance  to 1/2 tab

## 2024-07-03 NOTE — Assessment & Plan Note (Signed)
 Labile HTN: SBP 140-170 Taking Propranolol  once a day, not twice a day Go back on Propranolol  bid F/u w/Dr [employer]

## 2024-07-03 NOTE — Assessment & Plan Note (Signed)
 New. Pt started Crestor  daily since Auust (Dr Gevena was taking it 2/week prior w/Dr Von).LFTs were nl in Sept 2025. Hold Crestor  Check LFTs in 1 month

## 2024-07-03 NOTE — Assessment & Plan Note (Signed)
 Worse Use Lorazepam  more often Labile HTN: SBP 140-170 Taking Propranolol  once a day, not twice a day Go back on Propranolol  bid

## 2024-07-03 NOTE — Assessment & Plan Note (Signed)
 On Oxybutynin  - dry  mouth, dizzy

## 2024-07-05 ENCOUNTER — Other Ambulatory Visit: Payer: Self-pay | Admitting: Internal Medicine

## 2024-07-11 ENCOUNTER — Ambulatory Visit: Admitting: Internal Medicine

## 2024-07-11 ENCOUNTER — Other Ambulatory Visit: Payer: Self-pay | Admitting: Internal Medicine

## 2024-07-25 NOTE — Progress Notes (Signed)
 Cardiology Office Note:   Date:  07/26/2024  ID:  EARMA [name], DOB March 31, 1946, MRN [id number] PCP:  Garald Karlynn GAILS, MD  [facility] Providers Cardiologist:  Wendel Haws, MD Referring MD: Garald Karlynn GAILS, MD  Chief Complaint/Reason for Referral: Palpitations ASSESSMENT:    1. Palpitations   2. Type 2 diabetes mellitus without complication, without long-term current use of insulin (HCC)   3. Hypertension associated with diabetes (HCC)   4. Hyperlipidemia associated with type 2 diabetes mellitus (HCC)   5. Aortic atherosclerosis   6. CKD stage 3 due to type 2 diabetes mellitus (HCC)   7. BMI 30.0-30.9,adult   8. Murmur     PLAN:   In order of problems listed above: Palpitations: TSH was within normal limits recently.  Will obtain echocardiogram to evaluate further.  Monitor shows SVT and bradycardia.  Will stop the patient's propranolol  and reach out to her PCP regarding stopping timolol  eyedrops.  I am concerned that her SVT will become worse without these agents and therefore will refer to EP for further recommendations. T2DM: Continue aspirin  81 mg, Jardiance  10 mg, start losartan  25 mg, rosuvastatin  currently on hold due to LFTs Hypertension: Increase amlodipine  to 10 mg, start losartan  25 mg Hyperlipidemia: Continue rosuvastatin  when able.  LFTs were abnormal.  This is being managed by the patient's PCP.  Goal LDL given history of diabetes is at least less than 70. Aortic atherosclerosis: Continue aspirin  81 mg and restart rosuvastatin  when able. CKD stage IIIa: Continue Jardiance  10 mg, start losartan  25 mg for additional renal protection and check BMP in 1 week Elevated BMI: Discussed referral to Pharm.D. for recommendation regarding GLP-1 receptor agonist therapy; patient would like to defer this for now and consider in the future. Murmur: Will obtain echocardiogram.           Dispo:  Return in about 6 months (around 01/23/2025).       I spent 45  minutes reviewing all clinical data during and prior to this visit including all relevant imaging studies, laboratories, clinical information from other health systems and prior notes from both Cardiology and other specialties, interviewing the patient, conducting a complete physical examination, and coordinating care in order to formulate a comprehensive and personalized evaluation and treatment plan.   History of Present Illness:    FOCUSED PROBLEM LIST:   T2DM Not on insulin Palpitations SVT, symptomatic bradycardia monitor October 2025 Hypertension Hyperlipidemia Aortic atherosclerosis CT abdomen pelvis 2025 CKD stage III A BMI 04 August 2024:  Patient consents to use of AI scribe. The patient is a 78 year old female with the above listed medical problems referred for recommendations regarding palpitations.  The patient saw her primary care provider recently with these complaints.  At that visit her blood pressure was mildly elevated.  Additionally her LFTs were elevated so her rosuvastatin  was held with plans to check LFTs in a month.  She experiences frequent palpitations, often occurring at rest, such as while watching TV or during light activities like taking clothes out of the dryer. She describes feeling pulses in her legs after meals and notes fluctuations in her heart rate, sometimes feeling lightheaded when it is low.  She has been on propranolol  and timolol , which are known to slow the heart rate. She has a history of glaucoma and has been using timolol  eye drops, which may contribute to her slow heart rate. She is concerned about managing her glaucoma without these drops.  She has a history of hypertension  and is currently taking amlodipine , which is being increased from 5 mg to 10 mg. She also mentions a past experience of her heart racing when she was on a higher dose of propranolol , which was subsequently reduced.  Her family history includes her mother, who had a  pacemaker placed in her late 59s and had Alzheimer's disease.     Current Medications: Current Meds  Medication Sig   amLODipine  (NORVASC ) 10 MG tablet Take 1 tablet (10 mg total) by mouth daily.   aspirin  81 MG EC tablet Take 81 mg by mouth daily.   bimatoprost  (LUMIGAN ) 0.01 % SOLN Place 1 drop into both eyes at bedtime.   Blood Glucose Monitoring Suppl (ONETOUCH VERIO FLEX SYSTEM) w/Device KIT 1 each by Does not apply route in the morning, at noon, and at bedtime.    Cholecalciferol  (VITAMIN D3) 1.25 MG (50000 UT) CAPS TAKE 1 CAPSULE BY MOUTH EVERY 30 DAYS   [employer] DIGESTIVE PROBIOTIC 250 MG capsule TAKE 1 CAPSULE BY MOUTH 2 TIMES DAILY.   Cyanocobalamin  (NASCOBAL ) 500 MCG/0.1ML SOLN USE 1 SPRAY IN 1 NOSTRIL  ONCE A WEEK   DENTA 5000 PLUS 1.1 % CREA dental cream Take by mouth as directed.   diphenoxylate -atropine  (LOMOTIL ) 2.5-0.025 MG tablet Take 1 tablet by mouth 4 (four) times daily as needed for diarrhea or loose stools.   empagliflozin  (JARDIANCE ) 10 MG TABS tablet Take 1 tablet (10 mg total) by mouth daily with breakfast.   fluticasone  (FLONASE ) 50 MCG/ACT nasal spray Place 1 spray into both nostrils daily.   furosemide  (LASIX ) 20 MG tablet TAKE 0.5-1 TABLETS (10-20 MG TOTAL) BY MOUTH DAILY AS NEEDED FOR EDEMA (FOR LEG SWELLING).   glucose blood (ONETOUCH VERIO) test strip 1 each by Other route daily.   Lancets (ONETOUCH DELICA PLUS LANCET30G) MISC Check blood sugar alternating fasting and 2 hours after meals   LORazepam  (ATIVAN ) 0.5 MG tablet Take 1 tablet (0.5 mg total) by mouth 2 (two) times daily as needed for anxiety.   losartan  (COZAAR ) 25 MG tablet Take 1 tablet (25 mg total) by mouth at bedtime.   methocarbamol  (ROBAXIN ) 500 MG tablet Take 1 tablet (500 mg total) by mouth every 8 (eight) hours as needed for muscle spasms.   ONETOUCH VERIO test strip CHECK BLOOD SUGAR ALTERNATING FASTING AND 2 HOURS AFTER MEALS   oxybutynin  (DITROPAN ) 5 MG tablet TAKE 1 TABLET BY MOUTH EVERY 8  HOURS AS NEEDED FOR BLADDER SPASMS   PROLIA  60 MG/ML SOSY injection INJECT 1 SYRINGE UNDER THE SKIN ONCE EVERY 6 MONTHS   RABEprazole  (ACIPHEX ) 20 MG tablet Take 1 tablet (20 mg total) by mouth daily.   timolol  (TIMOPTIC ) 0.5 % ophthalmic solution Place 1 drop into both eyes 2 (two) times daily.   triamcinolone  cream (KENALOG) 0.1 % APPLY TO AFFECTED AREA TWICE A DAY AS NEEDED   Vitamin D , Ergocalciferol , (DRISDOL ) 1.25 MG (50000 UNIT) CAPS capsule Take 1 capsule (50,000 Units total) by mouth every 30 (thirty) days.   [DISCONTINUED] amLODipine  (NORVASC ) 5 MG tablet TAKE 1 TABLET (5 MG TOTAL) BY MOUTH DAILY.   [DISCONTINUED] propranolol  ER (INDERAL  LA) 60 MG 24 hr capsule TAKE 1 CAPSULE BY MOUTH 2 TIMES DAILY.     Review of Systems:   Please see the history of present illness.    All other systems reviewed and are negative.     EKGs/Labs/Other Test Reviewed:   EKG: 2025 sinus bradycardia  EKG Interpretation Date/Time:    Ventricular Rate:  PR Interval:    QRS Duration:    QT Interval:    QTC Calculation:   R Axis:      Text Interpretation:          CARDIAC STUDIES: Refer to CV Procedures and Imaging Tabs   Risk Assessment/Calculations:          Physical Exam:   VS:  BP (!) 150/68   Pulse 62   Ht 5' 2 (1.575 m)   Wt 167 lb 3.2 oz (75.8 kg)   SpO2 95%   BMI 30.58 kg/m    HYPERTENSION CONTROL Vitals:   07/26/24 1538 07/26/24 1609  BP: (!) 168/72 (!) 150/68    The patient's blood pressure is elevated above target today.  In order to address the patient's elevated BP: A new medication was prescribed today.      Wt Readings from Last 3 Encounters:  07/26/24 167 lb 3.2 oz (75.8 kg)  07/03/24 166 lb 6.4 oz (75.5 kg)  06/19/24 169 lb (76.7 kg)      GENERAL:  No apparent distress, AOx3 HEENT:  No carotid bruits, +2 carotid impulses, no scleral icterus CAR: RRR with 2 out of 6 systolic murmur, gallops, rubs, or thrills RES:  Clear to auscultation  bilaterally ABD:  Soft, nontender, nondistended, positive bowel sounds x 4 VASC:  +2 radial pulses, +2 carotid pulses NEURO:  CN 2-12 grossly intact; motor and sensory grossly intact PSYCH:  No active depression or anxiety EXT:  No edema, ecchymosis, or cyanosis  Signed, Lurena MARLA Red, MD  07/26/2024 4:17 PM    [facility] [address] [place], [place], [place]  [address] Phone: [phone]; Fax: [phone]   Note:  This document was prepared using Dragon voice recognition software and may include unintentional dictation errors.

## 2024-07-26 ENCOUNTER — Encounter: Payer: Self-pay | Admitting: Internal Medicine

## 2024-07-26 ENCOUNTER — Ambulatory Visit: Attending: Internal Medicine | Admitting: Internal Medicine

## 2024-07-26 ENCOUNTER — Telehealth: Payer: Self-pay | Admitting: Internal Medicine

## 2024-07-26 VITALS — BP 150/68 | HR 62 | Ht 62.0 in | Wt 167.2 lb

## 2024-07-26 DIAGNOSIS — E119 Type 2 diabetes mellitus without complications: Secondary | ICD-10-CM | POA: Insufficient documentation

## 2024-07-26 DIAGNOSIS — Z6830 Body mass index (BMI) 30.0-30.9, adult: Secondary | ICD-10-CM | POA: Diagnosis not present

## 2024-07-26 DIAGNOSIS — R002 Palpitations: Secondary | ICD-10-CM | POA: Insufficient documentation

## 2024-07-26 DIAGNOSIS — N183 Chronic kidney disease, stage 3 unspecified: Secondary | ICD-10-CM | POA: Insufficient documentation

## 2024-07-26 DIAGNOSIS — I700 Atherosclerosis of aorta: Secondary | ICD-10-CM | POA: Diagnosis not present

## 2024-07-26 DIAGNOSIS — I152 Hypertension secondary to endocrine disorders: Secondary | ICD-10-CM | POA: Diagnosis not present

## 2024-07-26 DIAGNOSIS — E785 Hyperlipidemia, unspecified: Secondary | ICD-10-CM | POA: Insufficient documentation

## 2024-07-26 DIAGNOSIS — R42 Dizziness and giddiness: Secondary | ICD-10-CM | POA: Diagnosis not present

## 2024-07-26 DIAGNOSIS — E1169 Type 2 diabetes mellitus with other specified complication: Secondary | ICD-10-CM | POA: Insufficient documentation

## 2024-07-26 DIAGNOSIS — R011 Cardiac murmur, unspecified: Secondary | ICD-10-CM | POA: Insufficient documentation

## 2024-07-26 DIAGNOSIS — E1122 Type 2 diabetes mellitus with diabetic chronic kidney disease: Secondary | ICD-10-CM | POA: Insufficient documentation

## 2024-07-26 DIAGNOSIS — E1159 Type 2 diabetes mellitus with other circulatory complications: Secondary | ICD-10-CM | POA: Diagnosis not present

## 2024-07-26 MED ORDER — LOSARTAN POTASSIUM 25 MG PO TABS: 25.0000 mg | ORAL_TABLET | Freq: Every day | ORAL | 3 refills | Status: AC

## 2024-07-26 MED ORDER — AMLODIPINE BESYLATE 10 MG PO TABS: 10.0000 mg | ORAL_TABLET | Freq: Every day | ORAL | 3 refills | Status: AC

## 2024-07-26 NOTE — Telephone Encounter (Signed)
     Cardiac Monitor Alert  Date of alert:  07/26/2024   Patient Name: [name]  DOB: 03/26/1946  MRN: [id number]   [facility] Cardiologist: None  [facility] EP:  None    Monitor Information: Long Term Monitor [ZioXT]  Reason:  Dizziness Ordering provider:  Karlynn [name]   Alert Bradycardia - slowest HR: 37 This is the 1st alert for this rhythm.   Next Cardiology Appointment   Date:  07/26/24  Provider:  Dr. Wendel  The patient was contacted today.  She is asymptomatic. Arrhythmia, symptoms and history reviewed with Dr. Wendel via Epic.   Plan:  Patient agrees to keep visit with Dr. Wendel today.   Other: Call from [place] [facility] [place] [facility]. Patient has completed zio wear time from 07/06/24 to 07/20/24. End of wear report available in Epic. Patient has appt with Dr. Wendel today 07/26/24 at 3:40 pm. Alert is for bradycardia, [name] directs me to page 20, strip 14. Patient was last seen in our office in 2018 for syncope by Dr. Fernande.  I spoke with patient who states she did not feel well when she went to bed after between 12 and 1 AM on 11/15 but she is not sure she remembers triggering the patient button. She denies any syncope or near syncope during her wear time.   [name] Sar, RN  07/26/2024 1:30 PM

## 2024-07-26 NOTE — Telephone Encounter (Signed)
 Caller Mel[facility]a) is reporting abnormal Zio results.

## 2024-07-26 NOTE — Patient Instructions (Addendum)
 Medication Instructions:  Stop Propranolol   Start Losartan  25 mg take one tablet at bedtime  Increase Amlodipine  10 mg take one tablet daily *If you need a refill on your cardiac medications before your next appointment, please call your pharmacy*  Lab Work: 1 week- BMP If you have labs (blood work) drawn today and your tests are completely normal, you will receive your results only by: MyChart Message (if you have MyChart) OR A paper copy in the mail If you have any lab test that is abnormal or we need to change your treatment, we will call you to review the results.  Testing/Procedures: Your physician has requested that you have an echocardiogram. Echocardiography is a painless test that uses sound waves to create images of your heart. It provides your doctor with information about the size and shape of your heart and how well your heart's chambers and valves are working. This procedure takes approximately one hour. There are no restrictions for this procedure. Please do NOT wear cologne, perfume, aftershave, or lotions (deodorant is allowed). Please arrive 15 minutes prior to your appointment time.  Please note: We ask at that you not bring children with you during ultrasound (echo/ vascular) testing. Due to room size and safety concerns, children are not allowed in the ultrasound rooms during exams. Our front office staff cannot provide observation of children in our lobby area while testing is being conducted. An adult accompanying a patient to their appointment will only be allowed in the ultrasound room at the discretion of the ultrasound technician under special circumstances. We apologize for any inconvenience.   Follow-Up: At [facility], you and your health needs are our priority.  As part of our continuing mission to provide you with exceptional heart care, our providers are all part of one team.  This team includes your primary Cardiologist (physician) and Advanced  Practice Providers or APPs (Physician Assistants and Nurse Practitioners) who all work together to provide you with the care you need, when you need it.  Your next appointment:   6 month(s)  Provider:   Lurena Red, MD    We recommend signing up for the patient portal called MyChart.  Sign up information is provided on this After Visit Summary.  MyChart is used to connect with patients for Virtual Visits (Telemedicine).  Patients are able to view lab/test results, encounter notes, upcoming appointments, etc.  Non-urgent messages can be sent to your provider as well.   To learn more about what you can do with MyChart, go to [email or web address].   Other Instructions Referral sent to Electrophysiology

## 2024-07-30 ENCOUNTER — Ambulatory Visit (HOSPITAL_COMMUNITY)
Admission: RE | Admit: 2024-07-30 | Discharge: 2024-07-30 | Disposition: A | Discharge status: Home / Self Care | Source: Ambulatory Visit | Attending: Internal Medicine | Admitting: Internal Medicine

## 2024-07-30 DIAGNOSIS — H40023 Open angle with borderline findings, high risk, bilateral: Secondary | ICD-10-CM | POA: Diagnosis not present

## 2024-07-30 DIAGNOSIS — R011 Cardiac murmur, unspecified: Secondary | ICD-10-CM | POA: Insufficient documentation

## 2024-07-30 DIAGNOSIS — H04123 Dry eye syndrome of bilateral lacrimal glands: Secondary | ICD-10-CM | POA: Diagnosis not present

## 2024-07-31 ENCOUNTER — Telehealth: Payer: Self-pay | Admitting: Internal Medicine

## 2024-07-31 ENCOUNTER — Ambulatory Visit: Payer: Self-pay | Admitting: Internal Medicine

## 2024-07-31 LAB — ECHOCARDIOGRAM COMPLETE
AR max vel: 2.03 cm2
AV Area VTI: 2.23 cm2
AV Area mean vel: 1.92 cm2
AV Mean grad: 9 mmHg
AV Peak grad: 18 mmHg
Ao pk vel: 2.12 m/s
Area-P 1/2: 3.58 cm2
S' Lateral: 2.41 cm

## 2024-07-31 NOTE — Telephone Encounter (Signed)
 STAT if HR is under 50 or over 120  (normal HR is 60-100 beats per minute)  What is your heart rate? 133 - currently   Do you have a log of your heart rate readings (document readings)?    154/71 hr 148 - 11/25 AM  103 - 11/25 AM  126 - 11/26 AM 133 - 11/26 currently   Do you have any other symptoms? No    Pt also waiting on EP referral

## 2024-07-31 NOTE — Telephone Encounter (Signed)
 Received incoming STAT call. Pt is c/o palpitations and HR 120's-130's. Denies CP, SOB/DOE, dizziness/lightheadedness. Since this past Sunday she has felt an increase in palpitations and her Pulse is higher than usual. She is very anxious. Will be taking a dose of PRN Xanax  today. Saw Dr. Wendel on 07/26/24 with the following med changes:  Medication Instructions:  Stop Propranolol    Start Losartan  25 mg take one tablet at bedtime   Increase Amlodipine  10 mg take one tablet daily  Was also referred to EP but no appointment scheduled yet. (Pt asking about this also)  Recent BP's  and HR Past Sunday  141/75; HR=91 161/77; HR=85  On Monday 131/88, HR=99 151/74; HR=90 150/80; HR=104  On Tuesday 154/71; HR=148 At [facility] Doctor BP was 160/85; HR=96 At Home 156/76; HR=103 150/71; HR=86 147/74; HR=85  Today HR=117 to 133 Today BP=145/84 Then 157/86 Then 130/73 at the end of our phone call

## 2024-08-02 DIAGNOSIS — E119 Type 2 diabetes mellitus without complications: Secondary | ICD-10-CM | POA: Diagnosis not present

## 2024-08-02 DIAGNOSIS — R002 Palpitations: Secondary | ICD-10-CM | POA: Diagnosis not present

## 2024-08-02 DIAGNOSIS — I152 Hypertension secondary to endocrine disorders: Secondary | ICD-10-CM | POA: Diagnosis not present

## 2024-08-02 DIAGNOSIS — E785 Hyperlipidemia, unspecified: Secondary | ICD-10-CM | POA: Diagnosis not present

## 2024-08-02 DIAGNOSIS — Z6830 Body mass index (BMI) 30.0-30.9, adult: Secondary | ICD-10-CM | POA: Diagnosis not present

## 2024-08-02 DIAGNOSIS — N183 Chronic kidney disease, stage 3 unspecified: Secondary | ICD-10-CM | POA: Diagnosis not present

## 2024-08-02 DIAGNOSIS — E1169 Type 2 diabetes mellitus with other specified complication: Secondary | ICD-10-CM | POA: Diagnosis not present

## 2024-08-02 DIAGNOSIS — E1122 Type 2 diabetes mellitus with diabetic chronic kidney disease: Secondary | ICD-10-CM | POA: Diagnosis not present

## 2024-08-02 DIAGNOSIS — I700 Atherosclerosis of aorta: Secondary | ICD-10-CM | POA: Diagnosis not present

## 2024-08-02 DIAGNOSIS — E1159 Type 2 diabetes mellitus with other circulatory complications: Secondary | ICD-10-CM | POA: Diagnosis not present

## 2024-08-02 LAB — BASIC METABOLIC PANEL WITH GFR
BUN/Creatinine Ratio: 15 (ref 12–28)
BUN: 15 mg/dL (ref 8–27)
CO2: 20 mmol/L (ref 20–29)
Calcium: 9.4 mg/dL (ref 8.7–10.3)
Chloride: 103 mmol/L (ref 96–106)
Creatinine, Ser: 1.02 mg/dL — ABNORMAL HIGH (ref 0.57–1.00)
Glucose: 105 mg/dL — ABNORMAL HIGH (ref 70–99)
Potassium: 3.5 mmol/L (ref 3.5–5.2)
Sodium: 141 mmol/L (ref 134–144)
eGFR: 56 mL/min/1.73 — ABNORMAL LOW (ref >59)

## 2024-08-04 ENCOUNTER — Other Ambulatory Visit: Payer: Self-pay | Admitting: Internal Medicine

## 2024-08-05 ENCOUNTER — Other Ambulatory Visit

## 2024-08-05 DIAGNOSIS — R7989 Other specified abnormal findings of blood chemistry: Secondary | ICD-10-CM | POA: Diagnosis not present

## 2024-08-05 LAB — COMPREHENSIVE METABOLIC PANEL WITH GFR
ALT: 26 U/L (ref 0–35)
AST: 27 U/L (ref 0–37)
Albumin: 4.4 g/dL (ref 3.5–5.2)
Alkaline Phosphatase: 61 U/L (ref 39–117)
BUN: 18 mg/dL (ref 6–23)
CO2: 27 meq/L (ref 19–32)
Calcium: 9.2 mg/dL (ref 8.4–10.5)
Chloride: 104 meq/L (ref 96–112)
Creatinine, Ser: 1.01 mg/dL (ref 0.40–1.20)
GFR: 53.27 mL/min — ABNORMAL LOW (ref >60.00)
Glucose, Bld: 103 mg/dL — ABNORMAL HIGH (ref 70–99)
Potassium: 3.2 meq/L — ABNORMAL LOW (ref 3.5–5.1)
Sodium: 141 meq/L (ref 135–145)
Total Bilirubin: 0.7 mg/dL (ref 0.2–1.2)
Total Protein: 7.2 g/dL (ref 6.0–8.3)

## 2024-08-06 ENCOUNTER — Encounter: Payer: Self-pay | Admitting: Internal Medicine

## 2024-08-06 ENCOUNTER — Encounter: Payer: Self-pay | Admitting: Endocrinology

## 2024-08-06 ENCOUNTER — Ambulatory Visit: Payer: Self-pay | Admitting: Internal Medicine

## 2024-08-06 ENCOUNTER — Ambulatory Visit: Payer: Self-pay | Admitting: Endocrinology

## 2024-08-06 ENCOUNTER — Ambulatory Visit: Admitting: Endocrinology

## 2024-08-06 VITALS — BP 124/60 | HR 91 | Resp 16 | Ht 62.0 in | Wt 167.8 lb

## 2024-08-06 DIAGNOSIS — Z7984 Long term (current) use of oral hypoglycemic drugs: Secondary | ICD-10-CM

## 2024-08-06 DIAGNOSIS — E118 Type 2 diabetes mellitus with unspecified complications: Secondary | ICD-10-CM

## 2024-08-06 DIAGNOSIS — E785 Hyperlipidemia, unspecified: Secondary | ICD-10-CM

## 2024-08-06 LAB — POCT GLYCOSYLATED HEMOGLOBIN (HGB A1C): Hemoglobin A1C: 5.6 % (ref 4.0–5.6)

## 2024-08-06 MED ORDER — ONETOUCH DELICA PLUS LANCET30G MISC: 2 refills | Status: DC

## 2024-08-06 MED ORDER — ONETOUCH VERIO VI STRP: 1.0000 | ORAL_STRIP | Freq: Every day | 3 refills | Status: AC

## 2024-08-06 MED ORDER — ONETOUCH VERIO VI STRP: ORAL_STRIP | 3 refills | Status: AC

## 2024-08-06 MED ORDER — ONETOUCH DELICA PLUS LANCET30G MISC: 1.0000 | Freq: Every day | 2 refills | Status: AC

## 2024-08-06 NOTE — Progress Notes (Signed)
 From  Outpatient Endocrinology Note Orissa Arreaga, MD  08/06/24  Patient's Name: [name]    DOB: Jul 27, 1946    MRN: [id number]                                                    REASON OF VISIT: Follow up of type 2 diabetes mellitus  PCP: [name], Karlynn GAILS, MD  HISTORY OF PRESENT ILLNESS:   HULA TASSO is a 78 y.o. old female with past medical history listed below, is here for follow up for type 2 diabetes mellitus.   Pertinent Diabetes History: Patient has type 2 diabetes mellitus, mainly based on fasting blood sugar more than 130 in the past.  She initially had prediabetes with hemoglobin A1c in the range of 5.7 to 6.4%.  She had seen dietitian in the past.  Chronic Diabetes Complications : Retinopathy: yes. Last ophthalmology exam was done on annually, following with ophthalmology regularly.  Nephropathy: CKD IIIa, following with nephrology. Peripheral neuropathy: yes Coronary artery disease: no Stroke: no  Relevant comorbidities and cardiovascular risk factors: Obesity: yes Body mass index is 30.69 kg/m.  Hypertension: Yes  Hyperlipidemia : Yes, on statin , on hold.  Current / Home Diabetic regimen includes: Jardiance  10 mg daily.  Started in November 2020.  Prior diabetic medications: She was intolerant to metformin .  Glycemic data:    She has One Touch Verio Flex glucometer.  Glucometer data download from November 18 to August 06, 2024, average blood sugar 126.  She has been checking blood sugar usually 2 times a day.  Mostly acceptable blood sugars some of the blood sugar 131, 100, 126, 87, 144, 114, 101, 102, 174.    Hypoglycemia: Patient has no hypoglycemic episodes. Patient has hypoglycemia awareness.  Factors modifying glucose control: 1.  Diabetic diet assessment: 2-3 meals a day.  2.  Staying active or exercising: No formal exercise.  3.  Medication compliance: compliant all of the time.  Interval history  Glucometer data as reviewed  above.  Susceptible blood sugar.  Hemoglobin A1c 5.6%.  She has been taking Jardiance .  Lately she has tachycardia, has been following with cardiology, adjusted and stopped medication propranolol  and eyedrops containing timolol .  Or heart rate in the clinic today 91.  She reports occasional palpitation.  She has planned to follow-up with cardiology.  She takes oxybutynin  for urinary incontinence.  She denies significant urinary problem UTI or yeast infection.  Rosuvastatin /Crestor  5 mg daily was restarted in August.  She had elevated liver enzymes, and Crestor  was kept on hold appropriately by primary care provider.  She has normalization of liver function test on the lab work yesterday.  She had acceptable cholesterol level in July when not taking Crestor .  Patient reports she used to take Crestor  few years ago every day however was decreased to twice a week in the past.  Patient is accompanied by her husband in the clinic today.  REVIEW OF SYSTEMS As per history of present illness.   PAST MEDICAL HISTORY: Past Medical History:  Diagnosis Date   Adenomatous colon polyp 04/08/2011   Anemia    Anxiety    Cataract    Diabetes mellitus without complication (HCC)    pre-diabetic per pt   Essential hypertension 08/10/2007   Chronic  On Catapress (per Dr Cary) - increase to bid -  d/c Hydralazine  prn per Dr Fernande d/c 3/19: Increase Inderal  LA to bid   GERD (gastroesophageal reflux disease)    Glaucoma (increased eye pressure)    Heart murmur    HTN (hypertension)    IBS (irritable bowel syndrome)    LBP (low back pain)    Menopause    Osteoporosis    Ovarian cancer (HCC) 09/2008   Dr Cecil   Pancreatic cyst    Vitamin B12 deficiency    Vitamin D  deficiency     PAST SURGICAL HISTORY: Past Surgical History:  Procedure Laterality Date   ABDOMINAL HYSTERECTOMY     APPENDECTOMY     BLADDER SURGERY  04/09/2020   tack and sling   CHOLECYSTECTOMY N/A 05/15/2017   Procedure:  LAPAROSCOPIC CHOLECYSTECTOMY WITH INTRAOPERATIVE CHOLANGIOGRAM AND LYSIS OF ADHESIONS;  Surgeon: Gail Favorite, MD;  Location: [facility];  Service: General;  Laterality: N/A;   COLONOSCOPY  2018   Dr.Perry   ELBOW FRACTURE SURGERY     age 72- left elbow   HEMORRHOID SURGERY  2001   Ovarian Cancer Debulking  09/2008   POLYPECTOMY      ALLERGIES: Allergies  Allergen Reactions   Boniva [Ibandronate Sodium]     cramp   Calcium  Channel Blockers     Upset stomach   Chlorthalidone      Diarrhea per pt   Compazine     Daughter reacts to compazine/pt does not want to take   Hyoscyamine      Dry mouth   Lyrica  [Pregabalin ]     Dizzy    Risedronate Sodium Other (See Comments)    Upset stomach   Tape Rash    redness    FAMILY HISTORY:  Family History  Problem Relation Age of Onset   Alzheimer's disease Mother 29   Other Mother 101       TAH for excessive bleeding   Lung cancer Father        smoker; metastasis to stomach and other areas   Heart attack Maternal Uncle    Heart disease Maternal Uncle    Other Paternal Aunt        stomach issues   Heart Problems Paternal Uncle    Other Maternal Grandmother        stomach issues; +hysterectomy   Heart attack Maternal Grandfather    Heart disease Maternal Grandfather    Infertility Daughter    Stomach cancer Cousin        dx. mid-60s   Other Cousin        stomach issues   Leukemia Cousin 18   Stomach cancer Other    Cancer Cousin        unknown type   Other Cousin        stomach issues   Heart Problems Maternal Uncle    Diabetes Paternal Aunt    Heart Problems Paternal Uncle    Emphysema Paternal Uncle        work exposure   Breast cancer Cousin        dx. late 60s-early 70s   Colon cancer Neg Hx    Rectal cancer Neg Hx    Esophageal cancer Neg Hx     SOCIAL HISTORY: Social History   Socioeconomic History   Marital status: Married    Spouse name: Arley   Number of children: 2   Years of education: BS   Highest  education level: Bachelor's degree (e.g., BA, AB, BS)  Occupational History   Occupation: Futures Trader  Employer: [employer]  Tobacco Use   Smoking status: Never   Smokeless tobacco: Never  Vaping Use   Vaping status: Never Used  Substance and Sexual Activity   Alcohol use: No    Alcohol/week: 0.0 standard drinks of alcohol   Drug use: No   Sexual activity: Not on file  Other Topics Concern   Not on file  Social History Narrative   Lives with spouse/2025   Caffeine  use: cokes      Right handed    Social Drivers of Health   Financial Resource Strain: Low Risk  (07/02/2024)   Overall Financial Resource Strain (CARDIA)    Difficulty of Paying Living Expenses: Not hard at all  Food Insecurity: No Food Insecurity (07/02/2024)   Hunger Vital Sign    Worried About Running Out of Food in the Last Year: Never true    Ran Out of Food in the Last Year: Never true  Transportation Needs: No Transportation Needs (07/02/2024)   PRAPARE - Administrator, Civil Service (Medical): No    Lack of Transportation (Non-Medical): No  Physical Activity: Inactive (07/02/2024)   Exercise Vital Sign    Days of Exercise per Week: 0 days    Minutes of Exercise per Session: Not on file  Stress: Stress Concern Present (07/02/2024)   [employer] of Occupational Health - Occupational Stress Questionnaire    Feeling of Stress: To some extent  Social Connections: Socially Integrated (07/02/2024)   Social Connection and Isolation Panel    Frequency of Communication with Friends and Family: More than three times a week    Frequency of Social Gatherings with Friends and Family: More than three times a week    Attends Religious Services: More than 4 times per year    Active Member of [employer] or Organizations: Yes    Attends Engineer, Structural: More than 4 times per year    Marital Status: Married    MEDICATIONS:  Current Outpatient Medications  Medication Sig Dispense Refill    brinzolamide (AZOPT) 1 % ophthalmic suspension 1 drop 3 (three) times daily.     amLODipine  (NORVASC ) 10 MG tablet Take 1 tablet (10 mg total) by mouth daily. 180 tablet 3   aspirin  81 MG EC tablet Take 81 mg by mouth daily.     bimatoprost  (LUMIGAN ) 0.01 % SOLN Place 1 drop into both eyes at bedtime.     Blood Glucose Monitoring Suppl (ONETOUCH VERIO FLEX SYSTEM) w/Device KIT 1 each by Does not apply route in the morning, at noon, and at bedtime.      Cholecalciferol  (VITAMIN D3) 1.25 MG (50000 UT) CAPS TAKE 1 CAPSULE BY MOUTH EVERY 30 DAYS 3 capsule 3   [employer] DIGESTIVE PROBIOTIC 250 MG capsule TAKE 1 CAPSULE BY MOUTH 2 TIMES DAILY. 50 capsule 2   Cyanocobalamin  (NASCOBAL ) 500 MCG/0.1ML SOLN USE 1 SPRAY IN 1 NOSTRIL  ONCE A WEEK 12 each 3   DENTA 5000 PLUS 1.1 % CREA dental cream Take by mouth as directed.     diphenoxylate -atropine  (LOMOTIL ) 2.5-0.025 MG tablet Take 1 tablet by mouth 4 (four) times daily as needed for diarrhea or loose stools. 60 tablet 5   empagliflozin  (JARDIANCE ) 10 MG TABS tablet Take 1 tablet (10 mg total) by mouth daily with breakfast. 90 tablet 3   fluticasone  (FLONASE ) 50 MCG/ACT nasal spray Place 1 spray into both nostrils daily. 15.8 mL 0   furosemide  (LASIX ) 20 MG tablet TAKE 0.5-1 TABLETS (10-20 MG TOTAL)  BY MOUTH DAILY AS NEEDED FOR EDEMA (FOR LEG SWELLING). 90 tablet 1   glucose blood (ONETOUCH VERIO) test strip Check blood sugar alternating fasting and 2 hours after meals 200 strip 3   glucose blood (ONETOUCH VERIO) test strip 1 each by Other route daily. 100 strip 3   Lancets (ONETOUCH DELICA PLUS LANCET30G) MISC Inject 1 each into the skin daily. Check blood sugar alternating fasting and 2 hours after meals 100 each 2   LORazepam  (ATIVAN ) 0.5 MG tablet Take 1 tablet (0.5 mg total) by mouth 2 (two) times daily as needed for anxiety. 60 tablet 2   losartan  (COZAAR ) 25 MG tablet Take 1 tablet (25 mg total) by mouth at bedtime. 90 tablet 3   methocarbamol  (ROBAXIN ) 500  MG tablet Take 1 tablet (500 mg total) by mouth every 8 (eight) hours as needed for muscle spasms. 90 tablet 1   oxybutynin  (DITROPAN ) 5 MG tablet TAKE 1 TABLET BY MOUTH EVERY 8 HOURS AS NEEDED FOR BLADDER SPASMS 270 tablet 1   PROLIA  60 MG/ML SOSY injection INJECT 1 SYRINGE UNDER THE SKIN ONCE EVERY 6 MONTHS 1 mL 0   RABEprazole  (ACIPHEX ) 20 MG tablet Take 1 tablet (20 mg total) by mouth daily. 90 tablet 3   timolol  (TIMOPTIC ) 0.5 % ophthalmic solution Place 1 drop into both eyes 2 (two) times daily.     triamcinolone  cream (KENALOG) 0.1 % APPLY TO AFFECTED AREA TWICE A DAY AS NEEDED     Vitamin D , Ergocalciferol , (DRISDOL ) 1.25 MG (50000 UNIT) CAPS capsule Take 1 capsule (50,000 Units total) by mouth every 30 (thirty) days. 3 capsule 3   No current facility-administered medications for this visit.    PHYSICAL EXAM: Vitals:   08/06/24 1103  BP: 124/60  Pulse: 91  Resp: 16  SpO2: 99%  Weight: 167 lb 12.8 oz (76.1 kg)  Height: 5' 2 (1.575 m)     Body mass index is 30.69 kg/m.  Wt Readings from Last 3 Encounters:  08/06/24 167 lb 12.8 oz (76.1 kg)  07/26/24 167 lb 3.2 oz (75.8 kg)  07/03/24 166 lb 6.4 oz (75.5 kg)    General: Well developed, well nourished female in no apparent distress.  HEENT: AT/[place], no external lesions.  Eyes: Conjunctiva clear and no icterus. Neck: Neck supple  Lungs: Respirations not labored Neurologic: Alert, oriented, normal speech Extremities / Skin: Dry.  Psychiatric: Does not appear depressed.  Diabetic Foot Exam - Simple   No data filed     LABS Reviewed Lab Results  Component Value Date   HGBA1C 5.6 08/06/2024   HGBA1C 6.2 (H) 04/02/2024   HGBA1C 6.3 09/28/2023   Lab Results  Component Value Date   FRUCTOSAMINE 257 09/23/2019   Lab Results  Component Value Date   CHOL 177 04/02/2024   HDL 53 04/02/2024   LDLCALC 98 04/02/2024   LDLDIRECT 122.0 05/24/2022   TRIG 159 (H) 04/02/2024   CHOLHDL 3.3 04/02/2024   Lab Results   Component Value Date   MICRALBCREAT 53 (H) 04/02/2024   Lab Results  Component Value Date   CREATININE 1.01 08/05/2024   Lab Results  Component Value Date   GFR 53.27 (L) 08/05/2024    ASSESSMENT / PLAN  1. Dyslipidemia   2. Controlled type 2 diabetes mellitus with complication, without long-term current use of insulin (HCC)     Diabetes Mellitus type 2, complicated by CKD, diabetic retinopathy and peripheral neuropathy. - Diabetic status / severity: Controlled.  Lab Results  Component Value Date   HGBA1C 5.6 08/06/2024    - Hemoglobin A1c goal : <6.5%  - Medications: No change.  I) continue Jardiance  10 mg daily.    Asked check blood sugar when symptomatic otherwise okay to check blood sugar in the morning fasting daily and occasionally at bedtime.  - Home glucose testing: In the morning fasting and occasionally at bedtime and as needed.  - Discussed/ Gave Hypoglycemia treatment plan.  # Consult : not required at this time.   # Annual urine for microalbuminuria/ creatinine ratio,+ microalbuminuria currently.  CKD 3 following with nephrology.  Currently on SGLT2 inhibitor. Last  Lab Results  Component Value Date   MICRALBCREAT 53 (H) 04/02/2024    # Foot check nightly / neuropathy.  # Annual dilated diabetic eye exams.  She has diabetic retinopathy, following regularly with ophthalmology.  - Diet: Eat reasonable portion sizes to promote a healthy weight  2. Blood pressure  -  BP Readings from Last 1 Encounters:  08/06/24 124/60    - Control is  in target.  - No change in current plans.  3. Lipid status / Hyperlipidemia - Last  Lab Results  Component Value Date   LDLCALC 98 04/02/2024   - Crestor  was restarted in August however had elevated liver enzymes.  Crestor  kept on hold from end of October.  She has normalization of liver enzymes. -Recommend not to resume rosuvastatin /Crestor . - She had acceptable lipids level in July when not on Crestor .   Patient reports she used to be on Crestor  5 mg twice a week in the past. - Okay to monitor lipids level without statin at this time. - Managed by primary care provider.   Diagnoses and all orders for this visit:  Dyslipidemia  Controlled type 2 diabetes mellitus with complication, without long-term current use of insulin (HCC) -     glucose blood (ONETOUCH VERIO) test strip; Check blood sugar alternating fasting and 2 hours after meals -     Discontinue: Lancets (ONETOUCH DELICA PLUS LANCET30G) MISC; Check blood sugar alternating fasting and 2 hours after meals -     POCT glycosylated hemoglobin (Hb A1C) -     glucose blood (ONETOUCH VERIO) test strip; 1 each by Other route daily. -     Lancets (ONETOUCH DELICA PLUS LANCET30G) MISC; Inject 1 each into the skin daily. Check blood sugar alternating fasting and 2 hours after meals     DISPOSITION Follow up in clinic in 4-5 months suggested.     All questions answered and patient verbalized understanding of the plan.  Russ Looper, MD [facility] Endocrinology [facility] [address] [place], [address] [place], [place] [address] Phone # [phone]  At least part of this note was generated using voice recognition software. Inadvertent word errors may have occurred, which were not recognized during the proofreading process.

## 2024-08-07 DIAGNOSIS — D485 Neoplasm of uncertain behavior of skin: Secondary | ICD-10-CM | POA: Diagnosis not present

## 2024-08-07 DIAGNOSIS — L218 Other seborrheic dermatitis: Secondary | ICD-10-CM | POA: Diagnosis not present

## 2024-08-07 DIAGNOSIS — D045 Carcinoma in situ of skin of trunk: Secondary | ICD-10-CM | POA: Diagnosis not present

## 2024-08-07 DIAGNOSIS — C44519 Basal cell carcinoma of skin of other part of trunk: Secondary | ICD-10-CM | POA: Diagnosis not present

## 2024-08-07 DIAGNOSIS — Z85828 Personal history of other malignant neoplasm of skin: Secondary | ICD-10-CM | POA: Diagnosis not present

## 2024-08-07 DIAGNOSIS — L821 Other seborrheic keratosis: Secondary | ICD-10-CM | POA: Diagnosis not present

## 2024-08-07 DIAGNOSIS — D1801 Hemangioma of skin and subcutaneous tissue: Secondary | ICD-10-CM | POA: Diagnosis not present

## 2024-08-07 DIAGNOSIS — L814 Other melanin hyperpigmentation: Secondary | ICD-10-CM | POA: Diagnosis not present

## 2024-08-07 DIAGNOSIS — D2339 Other benign neoplasm of skin of other parts of face: Secondary | ICD-10-CM | POA: Diagnosis not present

## 2024-08-09 MED ORDER — METOPROLOL SUCCINATE ER 25 MG PO TB24: 12.5000 mg | ORAL_TABLET | Freq: Every day | ORAL | 1 refills | Status: AC

## 2024-08-09 NOTE — Telephone Encounter (Signed)
 Called and spoke to pt. Her HR remains elevated 99-120's and even up to 140. She continues to feel palpitations. Toprol  XL 12.5 mg RX sent in per Dr. Dede. Pt is NOT taking Propranolol  and not on Timolol  eye drops either. Advised her to get back in touch with us  if she does not see an improvement with HR and/or symptoms. She verbalized understanding.

## 2024-08-11 ENCOUNTER — Other Ambulatory Visit: Payer: Self-pay | Admitting: Internal Medicine

## 2024-08-11 MED ORDER — POTASSIUM CHLORIDE ER 10 MEQ PO TBCR: 10.0000 meq | EXTENDED_RELEASE_TABLET | Freq: Two times a day (BID) | ORAL | 3 refills | Status: DC

## 2024-08-14 ENCOUNTER — Ambulatory Visit (INDEPENDENT_AMBULATORY_CARE_PROVIDER_SITE_OTHER): Admitting: Internal Medicine

## 2024-08-14 ENCOUNTER — Encounter: Payer: Self-pay | Admitting: Internal Medicine

## 2024-08-14 VITALS — BP 124/68 | HR 75 | Ht 62.0 in | Wt 166.0 lb

## 2024-08-14 DIAGNOSIS — N3281 Overactive bladder: Secondary | ICD-10-CM | POA: Diagnosis not present

## 2024-08-14 DIAGNOSIS — E876 Hypokalemia: Secondary | ICD-10-CM | POA: Diagnosis not present

## 2024-08-14 DIAGNOSIS — F419 Anxiety disorder, unspecified: Secondary | ICD-10-CM | POA: Diagnosis not present

## 2024-08-14 DIAGNOSIS — E538 Deficiency of other specified B group vitamins: Secondary | ICD-10-CM | POA: Diagnosis not present

## 2024-08-14 DIAGNOSIS — R55 Syncope and collapse: Secondary | ICD-10-CM | POA: Diagnosis not present

## 2024-08-14 NOTE — Assessment & Plan Note (Signed)
 Use Ditropan  prn  Potential benefits of a long term Ditropan   use as well as potential risks  and complications were explained to the patient and were aknowledged. Ok to reduce Jardiance  to 1/2 tab

## 2024-08-14 NOTE — Assessment & Plan Note (Addendum)
 Patch Wear Time:  13 days and 21 hours (2025-11-01T11:01:55-0400 to 2025-11-15T08:43:50-0500)   Patient had a min HR of 34 bpm, max HR of 148 bpm, and avg HR of 51 bpm. Predominant underlying rhythm was Sinus Rhythm. 58 Supraventricular Tachycardia runs occurred, the run with the fastest interval lasting 6 beats with a max rate of 148 bpm, the  longest lasting 19.3 secs with an avg rate of 100 bpm. Some episodes of Supraventricular Tachycardia may be possible Atrial Tachycardia with variable block. Isolated SVEs were rare (<1.0%), SVE Couplets were rare (<1.0%), and SVE Triplets were rare  (<1.0%). Isolated VEs were rare (<1.0%), VE Couplets were rare (<1.0%), and no VE Triplets were present. Ventricular Trigeminy was present. MD notification criteria for Symptomatic Bradycardia met - report posted prior to notification (VS).  Pt saw Dr Wendel - on Metoprolol  now. EP consult is pending 09/12/24. Take KCL

## 2024-08-14 NOTE — Progress Notes (Signed)
 Subjective:  Patient ID: ARDITH TEST, female    DOB: [birth year]  Age: 78 y.o. MRN: [id number]  CC: Medical Management of Chronic Issues (6 Week follow up for tachycardia and hypertension. Patient has since received heart monitor, performed echocardiogram. Medications have been readjusted twice by Cardiology. Metoprolol  has done well with decreasing heart rate. BP readings have been better at home)   HPI Lamarr LITTIE Ditch presents for low K, palpitations, HTN Pt saw Dr Wendel - on Metoprolol  now. EP consult is pending  Outpatient Medications Prior to Visit  Medication Sig Dispense Refill   amLODipine  (NORVASC ) 10 MG tablet Take 1 tablet (10 mg total) by mouth daily. 180 tablet 3   aspirin  81 MG EC tablet Take 81 mg by mouth daily.     bimatoprost  (LUMIGAN ) 0.01 % SOLN Place 1 drop into both eyes at bedtime.     Blood Glucose Monitoring Suppl (ONETOUCH VERIO FLEX SYSTEM) w/Device KIT 1 each by Does not apply route in the morning, at noon, and at bedtime.      brinzolamide (AZOPT) 1 % ophthalmic suspension 1 drop 3 (three) times daily.     Cholecalciferol  (VITAMIN D3) 1.25 MG (50000 UT) CAPS TAKE 1 CAPSULE BY MOUTH EVERY 30 DAYS 3 capsule 3   [employer] DIGESTIVE PROBIOTIC 250 MG capsule TAKE 1 CAPSULE BY MOUTH 2 TIMES DAILY. 50 capsule 2   Cyanocobalamin  (NASCOBAL ) 500 MCG/0.1ML SOLN USE 1 SPRAY IN 1 NOSTRIL  ONCE A WEEK 12 each 3   DENTA 5000 PLUS 1.1 % CREA dental cream Take by mouth as directed.     diphenoxylate -atropine  (LOMOTIL ) 2.5-0.025 MG tablet Take 1 tablet by mouth 4 (four) times daily as needed for diarrhea or loose stools. 60 tablet 5   empagliflozin  (JARDIANCE ) 10 MG TABS tablet Take 1 tablet (10 mg total) by mouth daily with breakfast. 90 tablet 3   fluticasone  (FLONASE ) 50 MCG/ACT nasal spray Place 1 spray into both nostrils daily. 15.8 mL 0   furosemide  (LASIX ) 20 MG tablet TAKE 0.5-1 TABLETS (10-20 MG TOTAL) BY MOUTH DAILY AS NEEDED FOR EDEMA (FOR LEG SWELLING). 90 tablet 1    glucose blood (ONETOUCH VERIO) test strip Check blood sugar alternating fasting and 2 hours after meals 200 strip 3   glucose blood (ONETOUCH VERIO) test strip 1 each by Other route daily. 100 strip 3   Lancets (ONETOUCH DELICA PLUS LANCET30G) MISC Inject 1 each into the skin daily. Check blood sugar alternating fasting and 2 hours after meals 100 each 2   LORazepam  (ATIVAN ) 0.5 MG tablet Take 1 tablet (0.5 mg total) by mouth 2 (two) times daily as needed for anxiety. 60 tablet 2   losartan  (COZAAR ) 25 MG tablet Take 1 tablet (25 mg total) by mouth at bedtime. 90 tablet 3   methocarbamol  (ROBAXIN ) 500 MG tablet Take 1 tablet (500 mg total) by mouth every 8 (eight) hours as needed for muscle spasms. 90 tablet 1   metoprolol  succinate (TOPROL  XL) 25 MG 24 hr tablet Take 0.5 tablets (12.5 mg total) by mouth at bedtime. 45 tablet 1   oxybutynin  (DITROPAN ) 5 MG tablet TAKE 1 TABLET BY MOUTH EVERY 8 HOURS AS NEEDED FOR BLADDER SPASMS 270 tablet 1   potassium chloride  (KLOR-CON  10) 10 MEQ tablet Take 1 tablet (10 mEq total) by mouth 2 (two) times daily. 180 tablet 3   PROLIA  60 MG/ML SOSY injection INJECT 1 SYRINGE UNDER THE SKIN ONCE EVERY 6 MONTHS 1 mL 0   RABEprazole  (  ACIPHEX ) 20 MG tablet Take 1 tablet (20 mg total) by mouth daily. 90 tablet 3   triamcinolone  cream (KENALOG) 0.1 % APPLY TO AFFECTED AREA TWICE A DAY AS NEEDED     Vitamin D , Ergocalciferol , (DRISDOL ) 1.25 MG (50000 UNIT) CAPS capsule Take 1 capsule (50,000 Units total) by mouth every 30 (thirty) days. 3 capsule 3   timolol  (TIMOPTIC ) 0.5 % ophthalmic solution Place 1 drop into both eyes 2 (two) times daily.     No facility-administered medications prior to visit.    ROS: Review of Systems  Constitutional:  Negative for activity change, appetite change, chills, fatigue and unexpected weight change.  HENT:  Negative for congestion, mouth sores and sinus pressure.   Eyes:  Negative for visual disturbance.  Respiratory:  Negative  for cough and chest tightness.   Cardiovascular:  Positive for palpitations.  Gastrointestinal:  Negative for abdominal pain and nausea.  Genitourinary:  Negative for difficulty urinating, frequency and vaginal pain.  Musculoskeletal:  Positive for arthralgias. Negative for back pain and gait problem.  Skin:  Negative for pallor and rash.  Neurological:  Negative for dizziness, tremors, weakness, numbness and headaches.  Psychiatric/Behavioral:  Negative for confusion, sleep disturbance and suicidal ideas. The patient is nervous/anxious.     Objective:  BP 124/68   Pulse 75   Ht 5' 2 (1.575 m)   Wt 166 lb (75.3 kg)   SpO2 99%   BMI 30.36 kg/m   BP Readings from Last 3 Encounters:  08/14/24 124/68  08/06/24 124/60  07/26/24 (!) 150/68    Wt Readings from Last 3 Encounters:  08/14/24 166 lb (75.3 kg)  08/06/24 167 lb 12.8 oz (76.1 kg)  07/26/24 167 lb 3.2 oz (75.8 kg)    Physical Exam Constitutional:      General: She is not in acute distress.    Appearance: She is well-developed. She is obese.  HENT:     Head: Normocephalic.     Right Ear: External ear normal.     Left Ear: External ear normal.     Nose: Nose normal.  Eyes:     General:        Right eye: No discharge.        Left eye: No discharge.     Conjunctiva/sclera: Conjunctivae normal.     Pupils: Pupils are equal, round, and reactive to light.  Neck:     Thyroid : No thyromegaly.     Vascular: No JVD.     Trachea: No tracheal deviation.  Cardiovascular:     Rate and Rhythm: Normal rate and regular rhythm.     Heart sounds: Normal heart sounds.  Pulmonary:     Effort: No respiratory distress.     Breath sounds: No stridor. No wheezing.  Abdominal:     General: Bowel sounds are normal. There is no distension.     Palpations: Abdomen is soft. There is no mass.     Tenderness: There is no abdominal tenderness. There is no guarding or rebound.  Musculoskeletal:        General: No tenderness.      Cervical back: Normal range of motion and neck supple. No rigidity.  Lymphadenopathy:     Cervical: No cervical adenopathy.  Skin:    Findings: No erythema or rash.  Neurological:     Cranial Nerves: No cranial nerve deficit.     Motor: No abnormal muscle tone.     Coordination: Coordination normal.     Gait: Gait  normal.     Deep Tendon Reflexes: Reflexes normal.  Psychiatric:        Behavior: Behavior normal.        Thought Content: Thought content normal.        Judgment: Judgment normal.     Lab Results  Component Value Date   WBC 5.2 06/24/2024   HGB 12.2 06/24/2024   HCT 36.0 06/24/2024   PLT 188.0 06/24/2024   GLUCOSE 103 (H) 08/05/2024   CHOL 177 04/02/2024   TRIG 159 (H) 04/02/2024   HDL 53 04/02/2024   LDLDIRECT 122.0 05/24/2022   LDLCALC 98 04/02/2024   ALT 26 08/05/2024   AST 27 08/05/2024   NA 141 08/05/2024   K 3.2 (L) 08/05/2024   CL 104 08/05/2024   CREATININE 1.01 08/05/2024   BUN 18 08/05/2024   CO2 27 08/05/2024   TSH 1.42 06/19/2024   HGBA1C 5.6 08/06/2024   MICROALBUR 9.8 04/02/2024    ECHOCARDIOGRAM COMPLETE Result Date: 07/31/2024    ECHOCARDIOGRAM REPORT   Patient Name:   KYNSLI HAAPALA Date of Exam: 07/30/2024 Medical Rec #:  [id number]         Height:       62.0 in Accession #:    [id number]        Weight:       167.2 lb Date of Birth:  [birth year]          BSA:          1.772 m Patient Age:    78 years          BP:           150/68 mmHg Patient Gender: F                 HR:           103 bpm. Exam Location:  [address] Procedure: 2D Echo, Cardiac Doppler and Color Doppler (Both Spectral and Color            Flow Doppler were utilized during procedure). Indications:    R01.1 Murmur  History:        Patient has prior history of Echocardiogram examinations, most                 recent 11/30/2016. Risk Factors:Hypertension, Diabetes and Hx                 Cancer.  Sonographer:    Powell Saras Referring Phys: [id number] ARUN K THUKKANI IMPRESSIONS   1. Left ventricular ejection fraction, by estimation, is 65 to 70%. The left ventricle has normal function. The left ventricle has no regional wall motion abnormalities. Left ventricular diastolic parameters are consistent with Grade I diastolic dysfunction (impaired relaxation).  2. Right ventricular systolic function is normal. The right ventricular size is normal. There is normal pulmonary artery systolic pressure. The estimated right ventricular systolic pressure is 32.4 mmHg.  3. The mitral valve is normal in structure. Trivial mitral valve regurgitation. No evidence of mitral stenosis.  4. The aortic valve is tricuspid. There is mild calcification of the aortic valve. Aortic valve regurgitation is mild. Aortic valve sclerosis/calcification is present, without any evidence of aortic stenosis. Aortic valve area, by VTI measures 2.23 cm.  Aortic valve mean gradient measures 9.0 mmHg. Aortic valve Vmax measures 2.12 m/s.  5. The inferior vena cava is normal in size with greater than 50% respiratory variability, suggesting right atrial pressure of 3 mmHg. FINDINGS  Left Ventricle: Left  ventricular ejection fraction, by estimation, is 65 to 70%. The left ventricle has normal function. The left ventricle has no regional wall motion abnormalities. The left ventricular internal cavity size was normal in size. There is  no left ventricular hypertrophy. Left ventricular diastolic parameters are consistent with Grade I diastolic dysfunction (impaired relaxation). Right Ventricle: The right ventricular size is normal. No increase in right ventricular wall thickness. Right ventricular systolic function is normal. There is normal pulmonary artery systolic pressure. The tricuspid regurgitant velocity is 2.71 m/s, and  with an assumed right atrial pressure of 3 mmHg, the estimated right ventricular systolic pressure is 32.4 mmHg. Left Atrium: Left atrial size was normal in size. Right Atrium: Right atrial size was normal in  size. Pericardium: There is no evidence of pericardial effusion. Mitral Valve: The mitral valve is normal in structure. Trivial mitral valve regurgitation. No evidence of mitral valve stenosis. Tricuspid Valve: The tricuspid valve is normal in structure. Tricuspid valve regurgitation is mild . No evidence of tricuspid stenosis. Aortic Valve: The aortic valve is tricuspid. There is mild calcification of the aortic valve. Aortic valve regurgitation is mild. Aortic valve sclerosis/calcification is present, without any evidence of aortic stenosis. Aortic valve mean gradient measures 9.0 mmHg. Aortic valve peak gradient measures 18.0 mmHg. Aortic valve area, by VTI measures 2.23 cm. Pulmonic Valve: The pulmonic valve was normal in structure. Pulmonic valve regurgitation is trivial. No evidence of pulmonic stenosis. Aorta: The aortic root is normal in size and structure. Venous: The inferior vena cava is normal in size with greater than 50% respiratory variability, suggesting right atrial pressure of 3 mmHg. IAS/Shunts: No atrial level shunt detected by color flow Doppler.  LEFT VENTRICLE PLAX 2D LVIDd:         3.79 cm   Diastology LVIDs:         2.41 cm   LV e' medial:    8.70 cm/s LV PW:         0.97 cm   LV E/e' medial:  6.5 LV IVS:        0.73 cm   LV e' lateral:   9.32 cm/s LVOT diam:     1.90 cm   LV E/e' lateral: 6.1 LV SV:         57 LV SV Index:   32 LVOT Area:     2.84 cm  RIGHT VENTRICLE             IVC RV Basal diam:  3.50 cm     IVC diam: 1.34 cm RV S prime:     23.80 cm/s TAPSE (M-mode): 2.5 cm LEFT ATRIUM             Index LA diam:        3.30 cm 1.86 cm/m LA Vol (A2C):   36.6 ml 20.66 ml/m LA Vol (A4C):   41.0 ml 23.14 ml/m LA Biplane Vol: 39.0 ml 22.01 ml/m  AORTIC VALVE AV Area (Vmax):    2.03 cm AV Area (Vmean):   1.92 cm AV Area (VTI):     2.23 cm AV Vmax:           212.00 cm/s AV Vmean:          139.000 cm/s AV VTI:            0.257 m AV Peak Grad:      18.0 mmHg AV Mean Grad:      9.0 mmHg  LVOT Vmax:  152.00 cm/s LVOT Vmean:        93.900 cm/s LVOT VTI:          0.202 m LVOT/AV VTI ratio: 0.79  AORTA Ao Root diam: 2.50 cm Ao Asc diam:  3.20 cm MITRAL VALVE               TRICUSPID VALVE MV Area (PHT): 3.58 cm    TR Peak grad:   29.4 mmHg MV Decel Time: 212 msec    TR Vmax:        271.00 cm/s MV E velocity: 56.70 cm/s MV A velocity: 99.50 cm/s  SHUNTS MV E/A ratio:  0.57        Systemic VTI:  0.20 m                            Systemic Diam: 1.90 cm Soyla Merck MD Electronically signed by Soyla Merck MD Signature Date/Time: 07/31/2024/8:36:48 AM    Final     Assessment & Plan:   Problem List Items Addressed This Visit     Anxiety   On Lorazepam  prn Labile HTN: SBP 140-170 On Metoprolol  now       B12 deficiency   On B12      Hypokalemia - Primary    Take KCL      Relevant Orders   Comprehensive metabolic panel with GFR   Magnesium    Near syncope   Patch Wear Time:  13 days and 21 hours (2025-11-01T11:01:55-0400 to 2025-11-15T08:43:50-0500)   Patient had a min HR of 34 bpm, max HR of 148 bpm, and avg HR of 51 bpm. Predominant underlying rhythm was Sinus Rhythm. 58 Supraventricular Tachycardia runs occurred, the run with the fastest interval lasting 6 beats with a max rate of 148 bpm, the  longest lasting 19.3 secs with an avg rate of 100 bpm. Some episodes of Supraventricular Tachycardia may be possible Atrial Tachycardia with variable block. Isolated SVEs were rare (<1.0%), SVE Couplets were rare (<1.0%), and SVE Triplets were rare  (<1.0%). Isolated VEs were rare (<1.0%), VE Couplets were rare (<1.0%), and no VE Triplets were present. Ventricular Trigeminy was present. MD notification criteria for Symptomatic Bradycardia met - report posted prior to notification (VS).  Pt saw Dr Wendel - on Metoprolol  now. EP consult is pending 09/12/24. Take KCL      OAB (overactive bladder)   Use Ditropan  prn  Potential benefits of a long term Ditropan   use as well  as potential risks  and complications were explained to the patient and were aknowledged. Ok to reduce Jardiance  to 1/2 tab         No orders of the defined types were placed in this encounter.     Follow-up: Return in about 3 months (around 11/12/2024) for a follow-up visit.  Marolyn Noel, MD

## 2024-08-14 NOTE — Patient Instructions (Addendum)
 Patch Wear Time:  13 days and 21 hours (2025-11-01T11:01:55-0400 to 2025-11-15T08:43:50-0500)   Patient had a min HR of 34 bpm, max HR of 148 bpm, and avg HR of 51 bpm. Predominant underlying rhythm was Sinus Rhythm. 58 Supraventricular Tachycardia runs occurred, the run with the fastest interval lasting 6 beats with a max rate of 148 bpm, the  longest lasting 19.3 secs with an avg rate of 100 bpm. Some episodes of Supraventricular Tachycardia may be possible Atrial Tachycardia with variable block. Isolated SVEs were rare (<1.0%), SVE Couplets were rare (<1.0%), and SVE Triplets were rare  (<1.0%). Isolated VEs were rare (<1.0%), VE Couplets were rare (<1.0%), and no VE Triplets were present. Ventricular Trigeminy was present. MD notification criteria for Symptomatic Bradycardia met - report posted prior to notification (VS).   Recent large-scale observational studies provide strong evidence that the shingles vaccine is associated with a reduced risk of developing dementia. It may also help slow cognitive decline in individuals who already have dementia.  Key Findings from Recent Research Multiple natural experiment studies, which leveraged unique vaccination policies to minimize bias, have consistently found a protective link:  Reduced Risk: One large study, published in [place], analyzed health records from over 280,000 older adults in [place] and found that those who received the live-attenuated shingles vaccine (Zostavax, now discontinued in the [place] ) were approximately 20% less likely to develop dementia over a seven-year follow-up period. Slower Progression: A follow-up study published in Cell indicated that for those already living with dementia, the vaccine appeared to slow the progression of the disease and reduced dementia-related deaths by nearly half over nine years. Vascular Dementia Protection: Other research presented at [facility] 2025 indicated that the vaccine lowered the risk of vascular  dementia by 50%. Newer Vaccine: A separate study published in [facility] [facility] suggested that the newer, more effective recombinant vaccine (Shingrix, currently used in the [place] ) also offers significant protection against dementia, with an association of lower risk than the older vaccine type. Stronger Effect in Women: Several studies noted that the protective effect against dementia was more pronounced in women than in men, possibly due to differences in immune responses or dementia pathogenesis.  Why Might the Vaccine Help? Researchers are still working to determine the exact mechanism, but current theories center on the idea that preventing the shingles virus (varicella-zoster) from reactivating helps protect the brain:  Reduced Inflammation: The most likely explanation is that the vaccine prevents shingles infections and subsequent inflammation in the nervous system, which is a known risk factor for neurodegenerative diseases like dementia. Immune System Modulation: It's also possible that the vaccine boosts the immune system more broadly, helping the body combat other processes related to cognitive decline.  Important Considerations Observational Data: While the evidence is strong, these findings primarily come from observational studies, which show an association, not a definitive cause-and-effect relationship. A large-scale randomized controlled trial is the gold standard for conclusive proof, but such trials are difficult to conduct for dementia due to the time and cost involved. Public Health Recommendation: The [place]. Centers for Disease Control and Prevention (CDC) already recommends the two-dose Shingrix vaccine for all healthy adults aged 15 and older primarily to prevent shingles and its painful complications. The potential added benefit for dementia prevention provides another compelling reason to get vaccinated.

## 2024-08-14 NOTE — Assessment & Plan Note (Signed)
 On B12

## 2024-08-14 NOTE — Assessment & Plan Note (Signed)
 Take KCL

## 2024-08-14 NOTE — Assessment & Plan Note (Signed)
 On Lorazepam  prn Labile HTN: SBP 140-170 On Metoprolol  now

## 2024-08-16 DIAGNOSIS — R42 Dizziness and giddiness: Secondary | ICD-10-CM | POA: Diagnosis not present

## 2024-08-16 DIAGNOSIS — R002 Palpitations: Secondary | ICD-10-CM | POA: Diagnosis not present

## 2024-09-11 ENCOUNTER — Other Ambulatory Visit

## 2024-09-11 DIAGNOSIS — E876 Hypokalemia: Secondary | ICD-10-CM

## 2024-09-11 LAB — COMPREHENSIVE METABOLIC PANEL WITH GFR
ALT: 16 U/L (ref 3–35)
AST: 21 U/L (ref 5–37)
Albumin: 4.4 g/dL (ref 3.5–5.2)
Alkaline Phosphatase: 65 U/L (ref 39–117)
BUN: 29 mg/dL — ABNORMAL HIGH (ref 6–23)
CO2: 22 meq/L (ref 19–32)
Calcium: 9.5 mg/dL (ref 8.4–10.5)
Chloride: 107 meq/L (ref 96–112)
Creatinine, Ser: 1.26 mg/dL — ABNORMAL HIGH (ref 0.40–1.20)
GFR: 40.82 mL/min — ABNORMAL LOW
Glucose, Bld: 120 mg/dL — ABNORMAL HIGH (ref 70–99)
Potassium: 3.4 meq/L — ABNORMAL LOW (ref 3.5–5.1)
Sodium: 139 meq/L (ref 135–145)
Total Bilirubin: 0.7 mg/dL (ref 0.2–1.2)
Total Protein: 7.4 g/dL (ref 6.0–8.3)

## 2024-09-11 LAB — MAGNESIUM: Magnesium: 1.6 mg/dL (ref 1.5–2.5)

## 2024-09-11 NOTE — Progress Notes (Signed)
 " Electrophysiology Office Note:    Date:  09/12/2024   ID:  [name], DOB [birth year], MRN [id number]  PCP:  Garald Karlynn GAILS, MD   [facility] Providers Cardiologist:  None     Referring MD: Wendel Lurena POUR, MD   History of Present Illness:    [name] is a 79 y.o. female with a medical history significant for nonsustained atrial tachycardia, aortic atherosclerosis, CKD stage III, referred for arrhythmia management.      Discussed the use of AI scribe software for clinical note transcription with the patient, who gave verbal consent to proceed.  History of Present Illness [name] is a 79 year old female who presents with palpitations and episodes of supraventricular tachycardia (SVT).  She has palpitations associated with both sinus rhythm and sinus bradycardia.   Ambulatory monitoring showed SVT with the longest run lasting 19 seconds. She notes marked heart rate variability, with episodes that feel too fast or too slow, including a rate of 105 bpm at rest. She sometimes feels a racing heart even when her heart rate readings are stable.  She recently changed rate control therapy, stopping propranolol  and timolol  and starting metoprolol , which she takes as a half tablet at night. She uses lorazepam  intermittently for stress and anxiety, which she feels slows her heart rate.  Over the past six months she has had nausea, vomiting, diarrhea, gas, and other gastrointestinal discomfort, particularly after potassium supplements, and uses Lomotil  for symptom control.  She has significant anxiety and feels it triggers or worsens palpitations. During stress she notes a racing heart, lightheadedness, fluttering, and shortness of breath. She has difficulty falling asleep due to tension and worry but denies snoring.         Today, she reports she feels really well and has no acute complaints  EKGs/Labs/Other Studies Reviewed Today:      Echocardiogram:  TEE TEE July 31, 2024 LVEF 65 to 70%.  Grade 1 diastolic dysfunction.  Normal RV systolic function.  Mild aortic valve calcification.  Aortic sclerosis without stenosis.   Monitors:  14 day monitor November 2025-- my interpretation Sinus rhythm, heart rate 34 to 96 bpm, average 51 bpm 58 episodes of atrial tachycardia, nonsustained.  The longest was 19 seconds.  There are no symptoms associated with SVT Overall burden of atrial ectopy less than 1% Symptoms of fluttering, irregular beats were associated with sinus rhythm, sinus bradycardia    EKG:   EKG Interpretation Date/Time:  Thursday September 12 2024 10:20:42 EST Ventricular Rate:  66 PR Interval:  148 QRS Duration:  88 QT Interval:  400 QTC Calculation: 419 R Axis:   9  Text Interpretation: Normal sinus rhythm Normal ECG When compared with ECG of 03-Feb-2022 16:01, No significant change was found Confirmed by Nancey Scotts [id number]) on 09/12/2024 10:27:59 AM     Physical Exam:    VS:  BP 132/64 (BP Location: Left Arm, Patient Position: Sitting, Cuff Size: Large)   Pulse 66   Ht 5' 2 (1.575 m)   Wt 166 lb 12.8 oz (75.7 kg)   SpO2 98%   BMI 30.51 kg/m     Wt Readings from Last 3 Encounters:  09/12/24 166 lb 12.8 oz (75.7 kg)  08/14/24 166 lb (75.3 kg)  08/06/24 167 lb 12.8 oz (76.1 kg)     GEN: Well nourished, well developed in no acute distress CARDIAC: RRR, no murmurs, rubs, gallops RESPIRATORY:  Normal work of breathing MUSCULOSKELETAL: no  edema    ASSESSMENT & PLAN:     SVT Nonsustained atrial tachycardia documented on recent monitor These episodes have been brief and not associated with symptoms. They often occur at nighttime At this time, no further intervention needed We discussed warning signs of abnormal rhythm --tachycardia with a set rate that does not improve with relaxation  Bradycardia Appears to be improved after discontinuation of propranolol  and  timolol   Palpitations All of her palpitation episodes were associated with sinus rhythm on her monitor But they appear to resolve with relaxation and sometimes with benzodiazepine I expect that they are likely related to anxiety     Signed, Eulas FORBES Furbish, MD  09/12/2024 10:45 AM    [facility] "

## 2024-09-12 ENCOUNTER — Encounter: Payer: Self-pay | Admitting: Cardiovascular Disease

## 2024-09-12 ENCOUNTER — Ambulatory Visit: Attending: Cardiovascular Disease | Admitting: Cardiovascular Disease

## 2024-09-12 VITALS — BP 132/64 | HR 66 | Ht 62.0 in | Wt 166.8 lb

## 2024-09-12 DIAGNOSIS — R002 Palpitations: Secondary | ICD-10-CM | POA: Insufficient documentation

## 2024-09-12 NOTE — Patient Instructions (Signed)
 Medication Instructions:  Your physician recommends that you continue on your current medications as directed. Please refer to the Current Medication list given to you today.  *If you need a refill on your cardiac medications before your next appointment, please call your pharmacy*  Lab Work: None ordered.  If you have labs (blood work) drawn today and your tests are completely normal, you will receive your results only by: MyChart Message (if you have MyChart) OR A paper copy in the mail If you have any lab test that is abnormal or we need to change your treatment, we will call you to review the results.  Testing/Procedures: None ordered.   Follow-Up: At [facility], you and your health needs are our priority.  As part of our continuing mission to provide you with exceptional heart care, our providers are all part of one team.  This team includes your primary Cardiologist (physician) and Advanced Practice Providers or APPs (Physician Assistants and Nurse Practitioners) who all work together to provide you with the care you need, when you need it.  Your next appointment:   Follow up as needed with Dr Nancey

## 2024-09-18 ENCOUNTER — Other Ambulatory Visit: Payer: Self-pay | Admitting: Internal Medicine

## 2024-09-22 ENCOUNTER — Ambulatory Visit: Payer: Self-pay | Admitting: Internal Medicine

## 2024-09-22 MED ORDER — POTASSIUM CHLORIDE ER 10 MEQ PO TBCR: 10.0000 meq | EXTENDED_RELEASE_TABLET | Freq: Three times a day (TID) | ORAL | 3 refills | Status: AC

## 2024-09-23 ENCOUNTER — Other Ambulatory Visit: Payer: Self-pay | Admitting: Internal Medicine

## 2024-09-23 ENCOUNTER — Telehealth: Payer: Self-pay

## 2024-09-23 NOTE — Telephone Encounter (Signed)
 Copied from CRM [id number]. Topic: Clinical - Medication Question >> Sep 23, 2024 10:19 AM Avram MATSU wrote: Reason for CRM: patient started dec 7th potassium chloride  and stated she's been having issues with diarrhea. Patient stated she only can take one daily and can barley stomach it. Patient is also on amLODipine  along with other medications not sure what's is causing her issues. Please advise [phone]

## 2024-09-23 NOTE — Telephone Encounter (Signed)
 Diarrhea could be a rare side effect with either potassium or amlodipine .  Stop potassium and see if better.  Use Lomotil  as needed.  Use Gatorade Thanks

## 2024-09-23 NOTE — Telephone Encounter (Signed)
 Tried to call pt and inform her of PCP advise as follows Diarrhea could be a rare side effect with either potassium or amlodipine . Stop potassium and see if better.  Use Lomotil  as needed.  Use Gatorade Thanks

## 2024-11-12 ENCOUNTER — Ambulatory Visit: Admitting: Internal Medicine

## 2024-11-20 ENCOUNTER — Ambulatory Visit: Admitting: Internal Medicine

## 2025-01-08 ENCOUNTER — Ambulatory Visit: Admitting: Endocrinology

## 2025-03-17 ENCOUNTER — Ambulatory Visit

## 2025-03-19 ENCOUNTER — Ambulatory Visit
